# Patient Record
Sex: Female | Born: 1935 | Race: White | Hispanic: No | Marital: Single | Smoking: Former smoker
Health system: Southern US, Community
[De-identification: ages and names within clinical notes are randomized; demographics above are authoritative.]

## PROBLEM LIST (undated history)

## (undated) DIAGNOSIS — E119 Type 2 diabetes mellitus without complications: Secondary | ICD-10-CM

## (undated) DIAGNOSIS — E785 Hyperlipidemia, unspecified: Secondary | ICD-10-CM

## (undated) DIAGNOSIS — I10 Essential (primary) hypertension: Secondary | ICD-10-CM

## (undated) DIAGNOSIS — C3490 Malignant neoplasm of unspecified part of unspecified bronchus or lung: Secondary | ICD-10-CM

## (undated) DIAGNOSIS — C4490 Unspecified malignant neoplasm of skin, unspecified: Secondary | ICD-10-CM

## (undated) DIAGNOSIS — H9190 Unspecified hearing loss, unspecified ear: Secondary | ICD-10-CM

## (undated) DIAGNOSIS — Z923 Personal history of irradiation: Secondary | ICD-10-CM

## (undated) DIAGNOSIS — I509 Heart failure, unspecified: Secondary | ICD-10-CM

## (undated) DIAGNOSIS — J449 Chronic obstructive pulmonary disease, unspecified: Secondary | ICD-10-CM

## (undated) HISTORY — PX: TONSILLECTOMY: SUR1361

## (undated) HISTORY — DX: Type 2 diabetes mellitus without complications: E11.9

## (undated) HISTORY — PX: FACIAL RECONSTRUCTION SURGERY: SHX631

## (undated) HISTORY — PX: MOHS SURGERY: SUR867

## (undated) HISTORY — DX: Personal history of irradiation: Z92.3

---

## 1999-01-24 ENCOUNTER — Other Ambulatory Visit: Admission: RE | Admit: 1999-01-24 | Discharge: 1999-01-24 | Payer: Self-pay | Admitting: Internal Medicine

## 1999-08-31 ENCOUNTER — Other Ambulatory Visit: Admission: RE | Admit: 1999-08-31 | Discharge: 1999-08-31 | Payer: Self-pay | Admitting: Obstetrics and Gynecology

## 1999-12-28 ENCOUNTER — Other Ambulatory Visit: Admission: RE | Admit: 1999-12-28 | Discharge: 1999-12-28 | Payer: Self-pay | Admitting: Obstetrics and Gynecology

## 2000-01-25 ENCOUNTER — Other Ambulatory Visit: Admission: RE | Admit: 2000-01-25 | Discharge: 2000-01-25 | Payer: Self-pay | Admitting: Obstetrics and Gynecology

## 2000-04-26 ENCOUNTER — Other Ambulatory Visit: Admission: RE | Admit: 2000-04-26 | Discharge: 2000-04-26 | Payer: Self-pay | Admitting: Plastic Surgery

## 2000-06-14 ENCOUNTER — Other Ambulatory Visit: Admission: RE | Admit: 2000-06-14 | Discharge: 2000-06-14 | Payer: Self-pay | Admitting: Obstetrics and Gynecology

## 2005-10-08 ENCOUNTER — Inpatient Hospital Stay (HOSPITAL_COMMUNITY): Admission: EM | Admit: 2005-10-08 | Discharge: 2005-10-15 | Payer: Self-pay | Admitting: Emergency Medicine

## 2005-10-14 ENCOUNTER — Ambulatory Visit: Payer: Self-pay | Admitting: Emergency Medicine

## 2005-10-22 ENCOUNTER — Ambulatory Visit: Payer: Self-pay | Admitting: Emergency Medicine

## 2005-10-25 ENCOUNTER — Ambulatory Visit: Admission: RE | Admit: 2005-10-25 | Discharge: 2005-10-25 | Payer: Self-pay | Admitting: Emergency Medicine

## 2005-11-14 ENCOUNTER — Ambulatory Visit: Payer: Self-pay | Admitting: Emergency Medicine

## 2006-02-04 ENCOUNTER — Ambulatory Visit: Payer: Self-pay | Admitting: Emergency Medicine

## 2006-04-22 ENCOUNTER — Ambulatory Visit: Payer: Self-pay | Admitting: Emergency Medicine

## 2006-06-27 ENCOUNTER — Ambulatory Visit: Payer: Self-pay | Admitting: Emergency Medicine

## 2006-10-29 ENCOUNTER — Ambulatory Visit: Payer: Self-pay | Admitting: Emergency Medicine

## 2007-06-21 DIAGNOSIS — I10 Essential (primary) hypertension: Secondary | ICD-10-CM | POA: Insufficient documentation

## 2007-06-21 DIAGNOSIS — F17200 Nicotine dependence, unspecified, uncomplicated: Secondary | ICD-10-CM | POA: Insufficient documentation

## 2007-06-21 DIAGNOSIS — J449 Chronic obstructive pulmonary disease, unspecified: Secondary | ICD-10-CM | POA: Insufficient documentation

## 2007-06-21 DIAGNOSIS — F329 Major depressive disorder, single episode, unspecified: Secondary | ICD-10-CM

## 2007-06-21 DIAGNOSIS — E782 Mixed hyperlipidemia: Secondary | ICD-10-CM | POA: Insufficient documentation

## 2009-09-22 ENCOUNTER — Encounter: Admission: RE | Admit: 2009-09-22 | Discharge: 2009-09-22 | Payer: Self-pay | Admitting: Internal Medicine

## 2011-01-19 NOTE — Assessment & Plan Note (Signed)
[facility]                               PULMONARY OFFICE NOTE   NAME:CLEMONSDeaysia, Grigoryan                     MRN:          [id number]  DATE:04/22/2006                            DOB:          26-Jul-1935    SUBJECTIVE:  Ms. Brummitt is a 76 year old woman with COPD who follows up  today for a regularly scheduled visit.  She tells me that she has been  having some clear nasal drainage for about the last 3-4 days consistent with  an upper respiratory infection.  She has had some associated cough that has  been nonproductive.  Her breathing was initially a little worse when these  symptoms started but it has improved.  She has not had any purulent sputum,  fevers, chills, etc.  She continues to smoke 6-8 cigarettes daily but she is  now contemplating cessation, which is an improvement.   MEDICATIONS:  1. Advair 500/50 one inhalation b.i.d.  2. Paxil 20 mg daily.  3. Spiriva one inhalation daily.  4. Lipitor 80 mg daily.  5. Albuterol two puffs q.4h. p.r.n. for shortness of breath.   EXAM:  GENERAL:  This is a comfortable, well appearing woman who is in no  distress on room air.  Her weight is 174 pounds, temperature 98.4, blood  pressure 116/72, heart rate 98%, SPO2 91% on room air.  HEENT EXAM:  Benign.  LUNGS:  Clear to auscultation bilaterally.  HEART:  Regular rate and rhythm without murmur.  ABDOMEN:  Soft, nontender with positive bowel sounds.  EXTREMITIES:  No clubbing, cyanosis or edema.  NEUROLOGIC:  She has a grossly nonfocal exam.   Her last walking oximetry was done at her most recent office visit that  showed no evidence of walking desaturation and she has not been wearing  oxygen since our last visit.   IMPRESSION:  1. Chronic obstructive pulmonary disease.  2. Continued tobacco use.   PLANS:  1. Continue Advair 500/50 one inhalation b.i.d. and Spiriva one inhalation      daily with albuterol p.r.n.  2. We discussed smoking  cessation.  She is thinking about quitting and she      will call me when she is ready to set a quit date in order to form a      strategy for successful cessation.  3. She has a prescription on reserve for azithromycin in case she develops      symptoms consistent with a COPD exacerbation.  4. She will continue using symptomatic medications for her upper      respiratory infection.  5. Followup with me in 3 months or sooner if she should have any      significant difficulties.                                   Leslye Peer, MD   RSB/MedQ  DD:  04/22/2006  DT:  04/23/2006  Job #:  [id number]

## 2011-01-19 NOTE — Discharge Summary (Signed)
NAMESUSI, GOSLIN            ACCOUNT NO.:  [id number]   MEDICAL RECORD NO.:  [id number]          PATIENT TYPE:  INP   LOCATION:  3033                         FACILITY:  [facility]   PHYSICIAN:  Leslye Peer, M.D.  DATE OF BIRTH:  Mar 14, 1935   DATE OF ADMISSION:  10/08/2005  DATE OF DISCHARGE:  10/15/2005                                 DISCHARGE SUMMARY   DISCHARGE DIAGNOSES:  1.  Community-acquired pneumonia.  2.  O2-dependent respiratory failure secondary to chronic obstructive      pulmonary disease.  3.  History of hypertension.  4.  Hyperlipidemia.  5.  Chronic back pain.  6.  Possible obstructive sleep apnea.  7.  Depression.  She will remain on her Paxil.   HISTORY OF PRESENT ILLNESS:  Ms. Shoun is a 76 year old smoker lifelong  who presented to the emergency department at [facility] with chest  x-ray demonstrating a right lower lobe opacity with hypotension.  She also  is a lifelong smoker half a pack a day since age 76.  She presented with a  five day history of fever, nausea, vomiting, diarrhea, positive cough, and  also with chills.  Of note, she had been taking her blood pressure  medications doxazosin and quinapril despite having nausea, vomiting, and  diarrhea.  She presented to the emergency department in hypotension with  blood pressure 80/40 with sinus tachycardia of 117 but she remained alert  and oriented.  Her chest x-ray __________  was consistent with a right lower  lobe pneumonia and changed to chronic obstructive pulmonary disease and due  to her hypotension and shock she was admitted for further evaluation and  treatment with placement of her right subclavian central venous catheter and  aggressive treatment for sepsis.   LABORATORY DATA:  Blood cultures demonstrated Streptococcus pneumonia.  Sodium 137, potassium 3.5, chloride 101, CO2 29, glucose 82, BUN 1,  creatinine 0.5, calcium 8.3.  WBC 12.2, RBC 3.84, hemoglobin 9.2, hematocrit  33.4, platelets 262.  Arterial blood gas pH 7.32, pCO2 of 41 with an O2 of  66 on 6 L nasal cannula.  Note, WBC did reach a peak of 20.6.  INR is 1.1.  D-dimer is 1.74.  Her sodium reached a low of 128 and returned to 137.  Lactic acid was 3.3.  Troponin I is 0.07.  Cortisol level is 24.4.  Blood  cultures demonstrated Streptococcus pneumonia.  Sputum culture showed normal  orangeal flora.   RADIOGRAPHIC DATA:  Chest x-ray shows opacity right space consistent with  pneumonia, possible effusion, cardiomegaly, and questionable mild  congestion.  12-lead EKG shows sinus tachycardia without acute injury  pattern.   HOSPITAL COURSE:  #1 - COMMUNITY-ACQUIRED PNEUMONIA WITH BLOOD CULTURE  POSITIVE STREPTOCOCCUS PNEUMONIA:  She is admitted to [facility],  treated with pharmaceutical intervention of IV antibiotics along with  supplemental oxygen.  She responded well to treatment, was ready for  discharge home by October 15, 2005.   #2 - SHOCK:  She presented with hypotension shock secondary to community-  acquired pneumonia and bacteremia.  She was treated with usual  pharmaceutical interventions of IV fluid.  She had a central line placed and  was with goal-directed therapy to keep her central venous pressure between  greater than 8 and less than 12 and a urine output less than 30 which was  successful.  Initially she was started on systemic antibiotics Rocephin and  Zithromax which was subsequently changed to Avelox which would be continued  for a full 10 days post discharge.   #3 - HYPERTENSION:  Antihypertensives were held due to her shock state and  were continued to be placed on hold as her blood pressure is now within  normal range.  She will follow up with Dr. Lucky Cowboy on an outpatient  basis as her condition improves for questionable re-institution of her  antihypertensives.   #4 - TOBACCO ABUSE:  She has been instructed to top.   #5 - HYPERLIPIDEMIA:  She has been  restarted on her Lipitor at 80 mg a day.  This will be followed up with Dr. Lucky Cowboy.   #6 - DIARRHEA:  The diarrhea had improved and has been intermittent.  She  will have a C. difficile collected prior to discharge for safety's sake and  to ascertain if she has C. difficile.  If she does, she will be contacted at  home and appropriate therapy will be instituted.   #7 - HISTORY OF SKIN CANCER:  Had no influence on this admission.   #8 - O2-DEPENDENT CHRONIC OBSTRUCTIVE PULMONARY DISEASE:  She was ambulated  up and down the halls with O2 saturations desaturating into the 86th  percentile range.  For this reason she will be on O2 24/7 on discharge.  Her  chronic obstructive pulmonary disease has been addressed with Advair,  Spiriva, and MDI puffer on a p.r.n. basis.   DISCHARGE DIET:  As is tolerated.   DISCHARGE FOLLOW-UP:  With Dr. Levy Pupa on February 19 2:30 p.m.  She  has also been instructed to follow up with Dr. Oneta Rack.   DISPOSITION/CONDITION ON DISCHARGE:  Improved.   DISCHARGE MEDICATIONS:  1.  Oxygen 2 L 24 hours a day via nasal prongs.  Note, new medication.  2.  Avelox 400 mg one time a day until gone.  Note, new medicine.  3.  Advair 550 one puff two times a day.  4.  Paxil 20 mg once a day.  5.  Spiriva one puff once a day.  6.  Lipitor 80 mg once a day.  7.  Albuterol MDI two puffs p.r.n. no more than four times a day.  8.  Darvocet one to two tablets twice a day p.r.n. for pain.  9.  For loose stools she may use over-the-counter Pepto-Bismol or Imodium.      Brett Canales Minor, A.C.N.P. [facility]    ______________________________  Leslye Peer, M.D.    SM/MEDQ  D:  10/15/2005  T:  10/15/2005  Job:  [id number]   cc:   Lucky Cowboy, M.D.  Fax: [phone]

## 2011-01-19 NOTE — Assessment & Plan Note (Signed)
[facility]                               PULMONARY OFFICE NOTE   NAME:Hailey Keith, Hailey Keith                     MRN:          [id number]  DATE:06/27/2006                            DOB:          Apr 20, 1935    SUBJECTIVE:  Hailey Keith is a 76 year old woman with moderate to severe COPD  and continued tobacco use.  She follows up today for a regularly scheduled  followup visit.  She tells me that her breathing is doing well, and that she  is not experiencing any shortness of breath when she exerts herself.  She  has been able to do her usual daily activities, and has not received  antibiotics or prednisone since our last visit in August.  She continues to  smoke a half pack of cigarettes daily, and although she is contemplating  cessation, she has not yet been able to commit herself to this.  She has an  everyday cough that is productive of whitish phlegm.  She has not required  any supplemental Albuterol beyond her maintenance therapy.   MEDICATIONS:  1. Advair 500/50 one inhalation b.i.d.  2. Paxil 20 mg daily.  3. Spiriva 1 inhalation daily.  4. Lipitor 80 mg daily.  5. Albuterol 2 puffs q. 4. h. p.r.n. for shortness of breath.   PHYSICAL EXAMINATION:  GENERAL:  This is a pleasant, elderly woman who is in  no distress on room air.  VITAL SIGNS:  Her weight is 177 pounds.  Temperature 98.2.  Blood pressure  138/74.  Heart rate 80.  SPA02 94% on room air.  LUNGS:  A bit distant, but clear to auscultation bilaterally.  There is no  wheezing on forced expiration.  CARDIOVASCULAR:  Normal.  ABDOMEN:  Slightly obese, soft and non-tender with positive bowel sounds.  EXTREMITIES:  No cyanosis, clubbing or edema.   IMPRESSION:  1. Moderate to severe chronic obstructive pulmonary disease.  2. Continued tobacco use.   PLAN:  1. We will continue her Spiriva and Advair as ordered.  If she continues      to do well on her next visit, then I may decrease  her Advair to 250/50      one inhalation b.i.d.  2. Continue Albuterol p.r.n.  3. Again, we discussed smoking cessation.  She continues to contemplate      this, but is not ready to stop at this time.  4. I will follow up with Hailey Keith in 4 months, or sooner should she      have any difficulties.  5. She has already had her flu shot for this year.  She will find out when      Pneumovax was      done from her primary care physician and leave [place] a message so that we      can keep track of this as well.    ______________________________  Leslye Peer, MD    RSB/MedQ  DD: 06/27/2006  DT: 06/28/2006  Job #: [id number]

## 2011-01-19 NOTE — Assessment & Plan Note (Signed)
[facility]                             PULMONARY OFFICE NOTE   NAME:CLEMONSBlondina, Coderre                     MRN:          [id number]  DATE:10/29/2006                            DOB:          07-14-1935    SUBJECTIVE:  Ms. Woodroof is a 76 year old woman with a history of  tobacco abuse and continued smoking with moderate to severe COPD. She  tells me that since our last visit she has been treated at least once  approximately two months ago with prednisone and antibiotics for an  exacerbation of her COPD. She has also been started on a medication for  her diabetes. She is not sure what the name of this medication is at  this time and did not bring it with her today. She complains of fatigue  and persistent cough. She has had upper respiratory type symptoms for  the last week to week and a half with nasal congestion and increase in  her cough. She produces thick white to tan phlegm. She continues to work  and stay active. Her activity is somewhat limited, but this is by her  back pain. Unfortunately, she continues to smoke. She has been smoking a  half pack daily.   CURRENT MEDICATIONS:  1. Advair 500/50 one inhalation b.i.d.  2. Paxil 20 mg daily.  3. Spiriva one inhalation daily.  4. Lipitor 80 mg daily.  5. A new diabetes medication that is not available at this time.  6. Albuterol two puffs q4 hours p.r.n. shortness of breath.   PHYSICAL EXAMINATION:  In general, this is an obese, well-appearing  woman who is in no distress on room air. Her weight is 177 pounds.  Temperature 98.3, blood pressure is 124/72, heart rate 83. SPO2 is 92%  on room air.  HEENT: She has some inspiratory noise and mucus in her upper airway that  improves when she clears her throat.  LUNGS:  Are coarse without any expiratory wheezes.  HEART: Has a regular rate and rhythm without murmur.  ABDOMEN: Is obese, soft, nontender with positive bowel sounds.  EXTREMITIES: Have no  cyanosis, clubbing or edema.   CHEST X-RAY: Performed today shows stable bilateral lower lobe  predominant interstitial prominence without any obvious infiltrates or  effusions.   IMPRESSION:  1. Chronic obstructive pulmonary disease.  2. Continued tobacco use.  3. Upper respiratory infection with an increase in her cough, but      without any signs or symptoms of an active chronic obstructive      pulmonary disease exacerbation.   PLAN:  1. Continue Advair and Spiriva as ordered with albuterol p.r.n.  2. Offered her antibiotics and/or prednisone at this time, but she      knows to call me if she starts to have signs or symptoms consistent      with an exacerbation including fever, purulent sputum and increased      sputum amount or worsening dyspnea.  3. We discussed tobacco cessation in detail. She is closer to quitting      at this time than on our previous discussions and I  have asked her      to come back to see me with her calendar so that we can set a      formal quit date in two months.  4. I will followup with Ms. Siple at that time or sooner should she      have any difficulties in the interim.     Leslye Peer, MD  Electronically Signed    RSB/MedQ  DD: 10/29/2006  DT: 10/29/2006  Job #: [id number]   cc:   Lucky Cowboy, M.D.

## 2011-03-29 ENCOUNTER — Encounter (INDEPENDENT_AMBULATORY_CARE_PROVIDER_SITE_OTHER): Payer: Self-pay | Admitting: Ophthalmology

## 2011-05-02 ENCOUNTER — Encounter (INDEPENDENT_AMBULATORY_CARE_PROVIDER_SITE_OTHER): Payer: Self-pay | Admitting: Ophthalmology

## 2011-12-26 ENCOUNTER — Emergency Department (HOSPITAL_COMMUNITY): Payer: Medicare Other

## 2011-12-26 ENCOUNTER — Encounter (HOSPITAL_COMMUNITY): Payer: Self-pay | Admitting: *Deleted

## 2011-12-26 ENCOUNTER — Inpatient Hospital Stay (HOSPITAL_COMMUNITY)
Admission: EM | Admit: 2011-12-26 | Discharge: 2011-12-29 | DRG: 193 | Disposition: A | Discharge status: Home Health | Payer: Medicare Other | Attending: Internal Medicine | Admitting: Internal Medicine

## 2011-12-26 DIAGNOSIS — F32A Depression, unspecified: Secondary | ICD-10-CM | POA: Diagnosis present

## 2011-12-26 DIAGNOSIS — Z79899 Other long term (current) drug therapy: Secondary | ICD-10-CM

## 2011-12-26 DIAGNOSIS — I10 Essential (primary) hypertension: Secondary | ICD-10-CM | POA: Diagnosis present

## 2011-12-26 DIAGNOSIS — J189 Pneumonia, unspecified organism: Principal | ICD-10-CM | POA: Diagnosis present

## 2011-12-26 DIAGNOSIS — E119 Type 2 diabetes mellitus without complications: Secondary | ICD-10-CM | POA: Diagnosis present

## 2011-12-26 DIAGNOSIS — J449 Chronic obstructive pulmonary disease, unspecified: Secondary | ICD-10-CM | POA: Diagnosis present

## 2011-12-26 DIAGNOSIS — H9190 Unspecified hearing loss, unspecified ear: Secondary | ICD-10-CM | POA: Diagnosis present

## 2011-12-26 DIAGNOSIS — J9620 Acute and chronic respiratory failure, unspecified whether with hypoxia or hypercapnia: Secondary | ICD-10-CM | POA: Diagnosis present

## 2011-12-26 DIAGNOSIS — J4489 Other specified chronic obstructive pulmonary disease: Secondary | ICD-10-CM | POA: Diagnosis present

## 2011-12-26 DIAGNOSIS — M549 Dorsalgia, unspecified: Secondary | ICD-10-CM

## 2011-12-26 DIAGNOSIS — F17200 Nicotine dependence, unspecified, uncomplicated: Secondary | ICD-10-CM | POA: Diagnosis present

## 2011-12-26 DIAGNOSIS — E785 Hyperlipidemia, unspecified: Secondary | ICD-10-CM | POA: Diagnosis present

## 2011-12-26 DIAGNOSIS — R0902 Hypoxemia: Secondary | ICD-10-CM

## 2011-12-26 DIAGNOSIS — F3289 Other specified depressive episodes: Secondary | ICD-10-CM | POA: Diagnosis present

## 2011-12-26 DIAGNOSIS — E782 Mixed hyperlipidemia: Secondary | ICD-10-CM | POA: Diagnosis present

## 2011-12-26 DIAGNOSIS — F329 Major depressive disorder, single episode, unspecified: Secondary | ICD-10-CM | POA: Diagnosis present

## 2011-12-26 HISTORY — DX: Essential (primary) hypertension: I10

## 2011-12-26 HISTORY — DX: Chronic obstructive pulmonary disease, unspecified: J44.9

## 2011-12-26 HISTORY — DX: Hyperlipidemia, unspecified: E78.5

## 2011-12-26 HISTORY — DX: Unspecified hearing loss, unspecified ear: H91.90

## 2011-12-26 LAB — POCT I-STAT 3, ART BLOOD GAS (G3+)
Acid-Base Excess: 2 mmol/L (ref 0.0–2.0)
Bicarbonate: 26.5 mEq/L — ABNORMAL HIGH (ref 20.0–24.0)
O2 Saturation: 95 %
Patient temperature: 98.6
pCO2 arterial: 41.1 mmHg (ref 35.0–45.0)

## 2011-12-26 LAB — DIFFERENTIAL
Basophils Absolute: 0 10*3/uL (ref 0.0–0.1)
Lymphs Abs: 1.7 10*3/uL (ref 0.7–4.0)
Monocytes Absolute: 1.5 10*3/uL — ABNORMAL HIGH (ref 0.1–1.0)
Monocytes Relative: 11 % (ref 3–12)
Neutro Abs: 11 10*3/uL — ABNORMAL HIGH (ref 1.7–7.7)
Neutrophils Relative %: 77 % (ref 43–77)

## 2011-12-26 LAB — CBC
Hemoglobin: 13.5 g/dL (ref 12.0–15.0)
MCH: 31.5 pg (ref 26.0–34.0)
RBC: 4.29 MIL/uL (ref 3.87–5.11)

## 2011-12-26 LAB — BASIC METABOLIC PANEL
CO2: 29 mEq/L (ref 19–32)
GFR calc Af Amer: >90 mL/min (ref >90)
Potassium: 3.3 mEq/L — ABNORMAL LOW (ref 3.5–5.1)
Sodium: 136 mEq/L (ref 135–145)

## 2011-12-26 MED ORDER — DEXTROSE 5 % IV SOLN
500.0000 mg | INTRAVENOUS | Status: DC
  Administered 2011-12-26: 500 mg via INTRAVENOUS
  Filled 2011-12-26 (×2): qty 500

## 2011-12-26 MED ORDER — ENOXAPARIN SODIUM 40 MG/0.4ML ~~LOC~~ SOLN
40.0000 mg | SUBCUTANEOUS | Status: DC
  Administered 2011-12-26 – 2011-12-28 (×3): 40 mg via SUBCUTANEOUS
  Filled 2011-12-26 (×4): qty 0.4

## 2011-12-26 MED ORDER — DEXTROSE 5 % IV SOLN
1.0000 g | INTRAVENOUS | Status: DC
  Administered 2011-12-26: 1 g via INTRAVENOUS
  Filled 2011-12-26 (×2): qty 10

## 2011-12-26 MED ORDER — DEXTROSE 5 % IV SOLN
500.0000 mg | Freq: Once | INTRAVENOUS | Status: AC
  Administered 2011-12-26: 500 mg via INTRAVENOUS
  Filled 2011-12-26: qty 500

## 2011-12-26 MED ORDER — ENALAPRIL MALEATE 10 MG PO TABS
10.0000 mg | ORAL_TABLET | Freq: Every day | ORAL | Status: DC
  Administered 2011-12-27 – 2011-12-29 (×3): 10 mg via ORAL
  Filled 2011-12-26 (×3): qty 1

## 2011-12-26 MED ORDER — DOXAZOSIN MESYLATE 8 MG PO TABS
8.0000 mg | ORAL_TABLET | Freq: Every day | ORAL | Status: DC
  Administered 2011-12-27 – 2011-12-28 (×2): 8 mg via ORAL
  Filled 2011-12-26 (×3): qty 1

## 2011-12-26 MED ORDER — TIOTROPIUM BROMIDE MONOHYDRATE 18 MCG IN CAPS
18.0000 ug | ORAL_CAPSULE | Freq: Every day | RESPIRATORY_TRACT | Status: DC
  Administered 2011-12-27 – 2011-12-29 (×3): 18 ug via RESPIRATORY_TRACT
  Filled 2011-12-26: qty 5

## 2011-12-26 MED ORDER — NICOTINE 14 MG/24HR TD PT24
14.0000 mg | MEDICATED_PATCH | Freq: Every day | TRANSDERMAL | Status: DC
  Administered 2011-12-27 – 2011-12-29 (×3): 14 mg via TRANSDERMAL
  Filled 2011-12-26 (×3): qty 1

## 2011-12-26 MED ORDER — SODIUM CHLORIDE 0.9 % IJ SOLN: 3.0000 mL | INTRAMUSCULAR | Status: DC | PRN

## 2011-12-26 MED ORDER — ALPRAZOLAM 0.5 MG PO TABS
0.5000 mg | ORAL_TABLET | Freq: Three times a day (TID) | ORAL | Status: DC | PRN
  Administered 2011-12-26 – 2011-12-28 (×4): 0.5 mg via ORAL
  Filled 2011-12-26 (×4): qty 1

## 2011-12-26 MED ORDER — ONDANSETRON HCL 4 MG/2ML IJ SOLN: 4.0000 mg | Freq: Four times a day (QID) | INTRAMUSCULAR | Status: DC | PRN

## 2011-12-26 MED ORDER — IPRATROPIUM BROMIDE 0.02 % IN SOLN
0.5000 mg | RESPIRATORY_TRACT | Status: DC | PRN
  Administered 2011-12-26 – 2011-12-27 (×2): 0.5 mg via RESPIRATORY_TRACT
  Filled 2011-12-26 (×2): qty 2.5

## 2011-12-26 MED ORDER — BENZONATATE 100 MG PO CAPS
100.0000 mg | ORAL_CAPSULE | Freq: Three times a day (TID) | ORAL | Status: DC | PRN
  Administered 2011-12-26: 100 mg via ORAL
  Filled 2011-12-26 (×2): qty 1

## 2011-12-26 MED ORDER — GUAIFENESIN ER 600 MG PO TB12
600.0000 mg | ORAL_TABLET | Freq: Two times a day (BID) | ORAL | Status: DC
  Administered 2011-12-26 – 2011-12-29 (×6): 600 mg via ORAL
  Filled 2011-12-26 (×7): qty 1

## 2011-12-26 MED ORDER — CEFTRIAXONE SODIUM 1 G IJ SOLR
1.0000 g | Freq: Once | INTRAMUSCULAR | Status: AC
  Administered 2011-12-26: 18:00:00 via INTRAVENOUS
  Filled 2011-12-26: qty 10

## 2011-12-26 MED ORDER — ALBUTEROL SULFATE (5 MG/ML) 0.5% IN NEBU
2.5000 mg | INHALATION_SOLUTION | RESPIRATORY_TRACT | Status: DC | PRN
  Administered 2011-12-26 – 2011-12-27 (×2): 2.5 mg via RESPIRATORY_TRACT
  Filled 2011-12-26 (×2): qty 0.5

## 2011-12-26 MED ORDER — ALUM & MAG HYDROXIDE-SIMETH 200-200-20 MG/5ML PO SUSP: 30.0000 mL | Freq: Four times a day (QID) | ORAL | Status: DC | PRN

## 2011-12-26 MED ORDER — FLUTICASONE-SALMETEROL 500-50 MCG/DOSE IN AEPB
1.0000 | INHALATION_SPRAY | Freq: Two times a day (BID) | RESPIRATORY_TRACT | Status: DC
  Administered 2011-12-26 – 2011-12-29 (×6): 1 via RESPIRATORY_TRACT
  Filled 2011-12-26: qty 14

## 2011-12-26 MED ORDER — ONDANSETRON HCL 4 MG PO TABS: 4.0000 mg | ORAL_TABLET | Freq: Four times a day (QID) | ORAL | Status: DC | PRN

## 2011-12-26 NOTE — H&P (Signed)
PCP:   Nadean Corwin, MD, MD   Chief Complaint:  Cough  HPI: This is a 77 year old female who went to [place] approximately week ago. This initially developed a mild stomach bug, she had some nausea, vomiting and mild diarrhea. On Monday she said the feeling weak and she was reported to be somewhat disoriented. Today her daughter drove to [place] to picked her up. They took her to see her PCP, she had a low-grade fever and a cough,she was also thought to be dehydrated. Her cough is productive for a beige phlegm. She was sent to the ER for evaluation. History provided by the patient and her daughter Hulan Fess who is her power of attorney. Patient was previously on home oxygen, she stopped this herself a few years back. Per daughter the PA at her PCPs office is ordering home oxygen.  Review of Systems: positives bolded   The patient denies anorexia, fever, weight loss,, vision loss, decreased hearing, hoarseness, chest pain, syncope, dyspnea on exertion, peripheral edema, balance deficits, hemoptysis, abdominal pain, melena, hematochezia, severe indigestion/heartburn, hematuria, incontinence, genital sores, muscle weakness, suspicious skin lesions, transient blindness, difficulty walking, depression, unusual weight change, abnormal bleeding, enlarged lymph nodes, angioedema, and breast masses.  Past Medical History: Past Medical History  Diagnosis Date  . COPD (chronic obstructive pulmonary disease)   . Hypertension   . Dyslipidemia   . Diabetes mellitus     Diet controlled   . Hard of hearing    History reviewed. No pertinent past surgical history.  Medications: Prior to Admission medications   Medication Sig Start Date End Date Taking? Authorizing Provider  ALPRAZolam Prudy Feeler) 0.5 MG tablet Take 0.5 mg by mouth 3 (three) times daily as needed. For anxiety   Yes Historical Provider, MD  doxazosin (CARDURA) 8 MG tablet Take 8 mg by mouth at bedtime.   Yes  Historical Provider, MD  enalapril (VASOTEC) 10 MG tablet Take 10 mg by mouth daily.   Yes Historical Provider, MD  Fluticasone-Salmeterol (ADVAIR) 500-50 MCG/DOSE AEPB Inhale 1 puff into the lungs every 12 (twelve) hours.   Yes Historical Provider, MD  guaiFENesin (MUCINEX) 600 MG 12 hr tablet Take 600 mg by mouth 2 (two) times daily.   Yes Historical Provider, MD  tiotropium (SPIRIVA) 18 MCG inhalation capsule Place 18 mcg into inhaler and inhale daily.   Yes Historical Provider, MD    Allergies:  No Known Allergies  Social History:  reports that she has been smoking.  She does not have any smokeless tobacco history on file. She reports that she drinks alcohol. She reports that she does not use illicit drugs. lives in a senior care center, no home oxygen, doesn't use a walker or cane, lives alone.  Family History: Family History  Problem Relation Age of Onset  . Hypertension      Physical Exam: Filed Vitals:   12/26/11 1635 12/26/11 1807  BP: 134/60 141/67  Pulse: 84 86  Temp: 98.6 F (37 C) 98.8 F (37.1 C)  TempSrc:  Rectal  Resp: 20   SpO2: 85% 94%    General:  Alert and oriented times three, well developed and nourished, no acute distress Eyes: PERRLA, pink conjunctiva, no scleral icterus ENT: Moist oral mucosa, neck supple, no thyromegaly Lungs: clear to ascultation, no wheeze, no crackles, no use of accessory muscles Cardiovascular: regular rate and rhythm, no regurgitation, no gallops, no murmurs. No carotid bruits, no JVD Abdomen: soft, positive BS, non-tender, non-distended, no organomegaly, not an  acute abdomen GU: not examined Neuro: CN II - XII grossly intact, sensation intact Musculoskeletal: strength 5/5 all extremities, no clubbing, cyanosis or edema Skin: no rash, no subcutaneous crepitation, no decubitus Psych: appropriate patient   Labs on Admission:   Basename 12/26/11 1715  NA 136  K 3.3*  CL 99  CO2 29  GLUCOSE 106*  BUN 9  CREATININE 0.62    CALCIUM 9.6  MG --  PHOS --   No results found for this basename: AST:2,ALT:2,ALKPHOS:2,BILITOT:2,PROT:2,ALBUMIN:2 in the last 72 hours No results found for this basename: LIPASE:2,AMYLASE:2 in the last 72 hours  Basename 12/26/11 1715  WBC 14.4*  NEUTROABS 11.0*  HGB 13.5  HCT 39.6  MCV 92.3  PLT 190    Basename 12/26/11 1712  CKTOTAL --  CKMB --  CKMBINDEX --  TROPONINI <0.30   No components found with this basename: POCBNP:3 No results found for this basename: DDIMER:2 in the last 72 hours No results found for this basename: HGBA1C:2 in the last 72 hours No results found for this basename: CHOL:2,HDL:2,LDLCALC:2,TRIG:2,CHOLHDL:2,LDLDIRECT:2 in the last 72 hours No results found for this basename: TSH,T4TOTAL,FREET3,T3FREE,THYROIDAB in the last 72 hours No results found for this basename: VITAMINB12:2,FOLATE:2,FERRITIN:2,TIBC:2,IRON:2,RETICCTPCT:2 in the last 72 hours  Micro Results: No results found for this or any previous visit (from the past 240 hour(s)).   Radiological Exams on Admission: Dg Chest 2 View  12/26/2011  *RADIOLOGY REPORT*  Clinical Data: Shortness of breath.  Cough.  CHEST - 2 VIEW  Comparison: Two-view chest 09/22/2009.  Findings: A right upper lobe pneumonia is present.  Bibasilar airspace disease is worse on the right.  Emphysematous changes are again noted.  The left upper lobe is clear.  The visualized soft tissues and bony thorax are unremarkable.  IMPRESSION:  1.  Right upper and lower lobe pneumonia. 2.  Minimal airspace disease at the left base likely reflects atelectasis. 3.  Emphysema.  Original Report Authenticated By: Jamesetta Orleans. MATTERN, M.D.    Assessment/Plan Present on Admission:  .Community acquired pneumonia .Hypoxia Admit to MedSurg Sputum cultures ordered Antibiotics Rocephin and azithromycin ordered Duo nebs, Tessalon Perles, oxygen ordered  .CHRONIC OBSTRUCTIVE PULMONARY DISEASE Tobacco abuse Nicotine patch  ordered Tobacco cessation education .HYPERLIPIDEMIA .HYPERTENSION .DEPRESSION Diabetes mellitus-diet controlled Stable resume home medications  Full code DVT prophylaxis Team 7/Dr. Andree Moro, Reyaan Thoma 12/26/2011, 8:31 PM

## 2011-12-26 NOTE — ED Notes (Signed)
Reports having recent n/v/d. Sent here today by pcp due to low o2 sats at appt, spo2 85% at triage, reports hx of copd.

## 2011-12-26 NOTE — ED Notes (Signed)
Reports onset of SOB x4 days with progressive worsening; denies any c/o pain at this time or pta; does endorse prod cough of "small" amt of "thick, beige" sputum as well as temp of 99.6 at home; audible wheezing noted upon interview; resp nonlabored; states smokes approx 4-5 cigarettes/day since she "was a teenager"; daughter reports spoke with pt via telephone and she appeared to be altered/confused; pt presently caox4, approp in conversation

## 2011-12-26 NOTE — ED Provider Notes (Signed)
History     CSN: [id number]  Arrival date & time 12/26/11  1617   First MD Initiated Contact with Patient 12/26/11 1656      Chief Complaint  Patient presents with  . Shortness of Breath    (Consider location/radiation/quality/duration/timing/severity/associated sxs/prior treatment) Patient is a 77 y.o. female presenting with shortness of breath and diarrhea. The history is provided by the patient and a relative.  Shortness of Breath  The current episode started more than 2 weeks ago. The problem occurs continuously. The problem has been unchanged. The problem is mild. The symptoms are relieved by nothing. The symptoms are aggravated by nothing. Associated symptoms include shortness of breath. Pertinent negatives include no chest pain and no cough. There were no sick contacts. She has received no recent medical care.  Diarrhea The primary symptoms include fatigue, nausea, vomiting and diarrhea. Primary symptoms do not include abdominal pain, dysuria or rash. The illness began 3 to 5 days ago. The onset was gradual. The problem has been gradually improving.  The emesis contains stomach contents.  The diarrhea is watery. The diarrhea occurs 2 to 4 times per day.    Past Medical History  Diagnosis Date  . COPD (chronic obstructive pulmonary disease)   . Hypertension     History reviewed. No pertinent past surgical history.  History reviewed. No pertinent family history.  History  Substance Use Topics  . Smoking status: Current Some Day Smoker  . Smokeless tobacco: Not on file  . Alcohol Use: Yes     occ wine    OB History    Grav Para Term Preterm Abortions TAB SAB Ect Mult Living                  Review of Systems  Constitutional: Positive for fatigue.  HENT: Negative for neck pain.   Respiratory: Positive for shortness of breath. Negative for cough and chest tightness.   Cardiovascular: Negative for chest pain.  Gastrointestinal: Positive for nausea, vomiting and  diarrhea. Negative for abdominal pain.  Genitourinary: Negative for dysuria.  Skin: Negative for rash.  Neurological: Negative for headaches.  Psychiatric/Behavioral: Positive for confusion.  All other systems reviewed and are negative.    Allergies  Review of patient's allergies indicates no known allergies.  Home Medications   Current Outpatient Rx  Name Route Sig Dispense Refill  . ALPRAZOLAM 0.5 MG PO TABS Oral Take 0.5 mg by mouth 3 (three) times daily as needed. For anxiety    . DOXAZOSIN MESYLATE 8 MG PO TABS Oral Take 8 mg by mouth at bedtime.    . ENALAPRIL MALEATE 10 MG PO TABS Oral Take 10 mg by mouth daily.    Marland Kitchen FLUTICASONE-SALMETEROL 500-50 MCG/DOSE IN AEPB Inhalation Inhale 1 puff into the lungs every 12 (twelve) hours.    . GUAIFENESIN ER 600 MG PO TB12 Oral Take 600 mg by mouth 2 (two) times daily.    Marland Kitchen TIOTROPIUM BROMIDE MONOHYDRATE 18 MCG IN CAPS Inhalation Place 18 mcg into inhaler and inhale daily.      BP 134/60  Pulse 84  Temp 98.6 F (37 C)  Resp 20  SpO2 85%  Physical Exam  Nursing note and vitals reviewed. Constitutional: She is oriented to person, place, and time. She appears well-developed and well-nourished.  HENT:  Head: Normocephalic and atraumatic.  Eyes: EOM are normal. Pupils are equal, round, and reactive to light.  Neck: Normal range of motion.  Cardiovascular: Normal rate, regular rhythm and normal heart  sounds.   Pulmonary/Chest: Effort normal. No respiratory distress. She has wheezes (Faint exp).  Abdominal: Soft. She exhibits no distension. There is no tenderness. There is no rebound and no guarding.  Musculoskeletal: Normal range of motion.  Neurological: She is alert and oriented to person, place, and time.  Skin: Skin is warm and dry.  Psychiatric: She has a normal mood and affect.    ED Course  Procedures (including critical care time)  Date: 12/26/2011  Rate: 72  Rhythm: normal sinus rhythm  QRS Axis: normal  Intervals:  normal  ST/T Wave abnormalities: normal  Conduction Disutrbances:none  Narrative Interpretation:   Old EKG Reviewed: changes noted; tachycardia has improved   Labs Reviewed  CBC - Abnormal; Notable for the following:    WBC 14.4 (*)    All other components within normal limits  DIFFERENTIAL - Abnormal; Notable for the following:    Neutro Abs 11.0 (*)    Monocytes Absolute 1.5 (*)    All other components within normal limits  BASIC METABOLIC PANEL - Abnormal; Notable for the following:    Potassium 3.3 (*)    Glucose, Bld 106 (*)    GFR calc non Af Amer 87 (*)    All other components within normal limits  PRO B NATRIURETIC PEPTIDE - Abnormal; Notable for the following:    Pro B Natriuretic peptide (BNP) 2180.0 (*)    All other components within normal limits  POCT I-STAT 3, BLOOD GAS (G3+) - Abnormal; Notable for the following:    pH, Arterial 7.417 (*)    pO2, Arterial 72.0 (*)    Bicarbonate 26.5 (*)    All other components within normal limits  TROPONIN I   Dg Chest 2 View  12/26/2011  *RADIOLOGY REPORT*  Clinical Data: Shortness of breath.  Cough.  CHEST - 2 VIEW  Comparison: Two-view chest 09/22/2009.  Findings: A right upper lobe pneumonia is present.  Bibasilar airspace disease is worse on the right.  Emphysematous changes are again noted.  The left upper lobe is clear.  The visualized soft tissues and bony thorax are unremarkable.  IMPRESSION:  1.  Right upper and lower lobe pneumonia. 2.  Minimal airspace disease at the left base likely reflects atelectasis. 3.  Emphysema.  Original Report Authenticated By: Jamesetta Orleans. MATTERN, M.D.     1. Community acquired pneumonia   2. Hypoxia       MDM  Patient presents for different complaints for herself it is related to her family member. Patient's primary complaints are some nausea and vomiting and occasional diarrhea she's had intermittently over 3 days. This occurred while in other family members house. She reports  decreased by mouth intake. She denies abdominal pain. She's had chills but no fevers. Further history from her daughter states that her mental status has been off for approximately one to 2 days. She's been worked up using usual. She also appears as though she is more short of breath than usual. In questioning the patient she denies any increased cough or shortness of breath. She states her sputum production is actually decreased. Patient has a history of COPD and was previously on oxygen therapy but has been off of it for approximately 5 years when she took herself off. She was here by her PCP. Here the patient is hypoxic to 85% on room air.  Her labs showed elevated white count of 14,000 a chest x-ray is concerning for pneumonia. [facility] but  sounds his private housing thus will treated for community-acquired pneumonia. Admitted to hospitalist.        Donnamarie Poag, MD 12/26/11 2356

## 2011-12-26 NOTE — ED Notes (Signed)
[id number] Ready

## 2011-12-26 NOTE — ED Notes (Signed)
Patient resting with NAD at this time. Patient remains on monitor and sats of 96%.

## 2011-12-27 LAB — CBC
HCT: 36.6 % (ref 36.0–46.0)
Hemoglobin: 12.2 g/dL (ref 12.0–15.0)
MCH: 30.7 pg (ref 26.0–34.0)
RBC: 3.97 MIL/uL (ref 3.87–5.11)

## 2011-12-27 LAB — BASIC METABOLIC PANEL
Chloride: 99 mEq/L (ref 96–112)
GFR calc Af Amer: >90 mL/min (ref >90)
GFR calc non Af Amer: >90 mL/min (ref >90)
Glucose, Bld: 90 mg/dL (ref 70–99)
Potassium: 3 mEq/L — ABNORMAL LOW (ref 3.5–5.1)
Sodium: 136 mEq/L (ref 135–145)

## 2011-12-27 LAB — EXPECTORATED SPUTUM ASSESSMENT W GRAM STAIN, RFLX TO RESP C

## 2011-12-27 MED ORDER — METHYLPREDNISOLONE SODIUM SUCC 125 MG IJ SOLR
60.0000 mg | Freq: Two times a day (BID) | INTRAMUSCULAR | Status: DC
  Administered 2011-12-27: 60 mg via INTRAVENOUS
  Filled 2011-12-27 (×3): qty 0.96

## 2011-12-27 MED ORDER — PREDNISONE 50 MG PO TABS: 50.0000 mg | ORAL_TABLET | Freq: Every day | ORAL | Status: DC

## 2011-12-27 MED ORDER — ALBUTEROL SULFATE (5 MG/ML) 0.5% IN NEBU
2.5000 mg | INHALATION_SOLUTION | Freq: Four times a day (QID) | RESPIRATORY_TRACT | Status: DC
  Administered 2011-12-27 – 2011-12-29 (×6): 2.5 mg via RESPIRATORY_TRACT
  Filled 2011-12-27 (×7): qty 0.5

## 2011-12-27 MED ORDER — MOXIFLOXACIN HCL 400 MG PO TABS
400.0000 mg | ORAL_TABLET | Freq: Every day | ORAL | Status: DC
  Administered 2011-12-27 – 2011-12-28 (×2): 400 mg via ORAL
  Filled 2011-12-27 (×3): qty 1

## 2011-12-27 MED ORDER — PREDNISONE 50 MG PO TABS
50.0000 mg | ORAL_TABLET | Freq: Every day | ORAL | Status: DC
  Filled 2011-12-27 (×2): qty 1

## 2011-12-27 NOTE — ED Provider Notes (Signed)
  I performed a history and physical examination of Hailey Keith and discussed her management with Dr. Vear Clock.  I agree with the history, physical, assessment, and plan of care, with the following exceptions: None  I was present for the following procedures: None Time Spent in Critical Care of the patient: None Time spent in discussions with the patient and family: 10  Hailey Keith    This elderly female with COPD and presents with multiple complaints, notably fatigue, shortness of breath and diarrhea.  On exam the patient is in no distress, but she requires a little oxygen to achieve appropriate saturation.  The patient's saturation on room air is 85% which is abnormal.  The patient is not tachycardic with sinus rhythm.  Have reviewed the ECGs well, which is nonischemic.  The patient's x-ray demonstrates right sided pneumonia.  The patient was admitted for further evaluation and management following provision of antibiotics in the emergency department.  Gerhard Munch, MD 12/27/11 0010

## 2011-12-27 NOTE — Progress Notes (Signed)
Subjective: Breathing improved.  Objective: Filed Vitals:   12/26/11 1807 12/26/11 2121 12/26/11 2144 12/27/11 0620  BP: 141/67 151/69 145/68 145/69  Pulse: 86  77 85  Temp: 98.8 F (37.1 C) 99 F (37.2 C) 99.4 F (37.4 C) 99 F (37.2 C)  TempSrc: Rectal Oral Oral Oral  Resp:   18 22  Height:   5\' 4"  (1.626 m)   Weight:   61.8 kg (136 lb 3.9 oz)   SpO2: 94% 95% 94% 91%   Weight change:  No intake or output data in the 24 hours ending 12/27/11 1212  General: Alert, awake, oriented x3, in no acute distress.  HEENT: No bruits, no goiter.  Heart: Regular rate and rhythm, without murmurs, rubs, gallops.  Lungs: Crackles left side, bilateral air movement. wheeZING B/L Abdomen: Soft, nontender, nondistended, positive bowel sounds.  Neuro: Grossly intact, nonfocal.   Lab Results:  Basename 12/27/11 0640 12/26/11 1715  NA 136 136  K 3.0* 3.3*  CL 99 99  CO2 27 29  GLUCOSE 90 106*  BUN 5* 9  CREATININE 0.52 0.62  CALCIUM 8.7 9.6  MG -- --  PHOS -- --   No results found for this basename: AST:2,ALT:2,ALKPHOS:2,BILITOT:2,PROT:2,ALBUMIN:2 in the last 72 hours No results found for this basename: LIPASE:2,AMYLASE:2 in the last 72 hours  Basename 12/27/11 0640 12/26/11 1715  WBC 10.9* 14.4*  NEUTROABS -- 11.0*  HGB 12.2 13.5  HCT 36.6 39.6  MCV 92.2 92.3  PLT 188 190    Basename 12/26/11 1712  CKTOTAL --  CKMB --  CKMBINDEX --  TROPONINI <0.30   No components found with this basename: POCBNP:3 No results found for this basename: DDIMER:2 in the last 72 hours No results found for this basename: HGBA1C:2 in the last 72 hours No results found for this basename: CHOL:2,HDL:2,LDLCALC:2,TRIG:2,CHOLHDL:2,LDLDIRECT:2 in the last 72 hours No results found for this basename: TSH,T4TOTAL,FREET3,T3FREE,THYROIDAB in the last 72 hours No results found for this basename: VITAMINB12:2,FOLATE:2,FERRITIN:2,TIBC:2,IRON:2,RETICCTPCT:2 in the last 72 hours  Micro Results: No results  found for this or any previous visit (from the past 240 hour(s)).  Studies/Results: Dg Chest 2 View  12/26/2011  *RADIOLOGY REPORT*  Clinical Data: Shortness of breath.  Cough.  CHEST - 2 VIEW  Comparison: Two-view chest 09/22/2009.  Findings: A right upper lobe pneumonia is present.  Bibasilar airspace disease is worse on the right.  Emphysematous changes are again noted.  The left upper lobe is clear.  The visualized soft tissues and bony thorax are unremarkable.  IMPRESSION:  1.  Right upper and lower lobe pneumonia. 2.  Minimal airspace disease at the left base likely reflects atelectasis. 3.  Emphysema.  Original Report Authenticated By: Jamesetta Orleans. MATTERN, M.D.    Medications: I have reviewed the patient's current medications.  Assessment and plan: Principal Problem:  *Community acquired pneumonia: -D/C Rocephin azithro, afebrile, WBC trending down. Continue O2.  -START AVELOX   HYPERLIPIDEMIA -Stable.   TOBACCO ABUSE -Counseling   DEPRESSION -Stable.   HYPERTENSION -Stable monitor.   CHRONIC OBSTRUCTIVE PULMONARY DISEASE -Mild wheezing start steroids. -Start steroids   Hypoxia -resolved.  -ambulate without oxygen.   LOS: 1 day   Marinda Elk M.D. Pager: [phone] [facility] 12/27/2011, 12:12 PM

## 2011-12-27 NOTE — Progress Notes (Signed)
Utilization Review Completed.Preslea Rhodus T4/25/2013   

## 2011-12-27 NOTE — Progress Notes (Signed)
Pt set up on continuous pulse ox this AM to monitor oxygen saturations due to hypoxia on admission. Pt was maintaining a saturation around 87-88% with a pulse in the mid to upper 80s. She was on 3L. Pt was not SOB and mildly tachypnic at around 20 breaths a minute. No complaints. MD notified and stated it was ok to turn up oxygen to keep O2 sats above 88%. Pt titrated up to 3.5L and is maintaining around 90%. Will continue to monitor.

## 2011-12-28 MED ORDER — METHYLPREDNISOLONE SODIUM SUCC 125 MG IJ SOLR
125.0000 mg | Freq: Two times a day (BID) | INTRAMUSCULAR | Status: DC
  Administered 2011-12-28 (×2): 125 mg via INTRAVENOUS
  Filled 2011-12-28 (×2): qty 2

## 2011-12-28 MED ORDER — ZOLPIDEM TARTRATE 5 MG PO TABS: 5.0000 mg | ORAL_TABLET | Freq: Every evening | ORAL | Status: DC | PRN

## 2011-12-28 MED ORDER — PREDNISONE 50 MG PO TABS
60.0000 mg | ORAL_TABLET | Freq: Two times a day (BID) | ORAL | Status: DC
  Administered 2011-12-29: 60 mg via ORAL
  Filled 2011-12-28 (×3): qty 1

## 2011-12-28 NOTE — Progress Notes (Signed)
Patient sat in the chair without oxygen and her O2 sat dropped down to 85% ,put back on oxygen 3liter and O2 sat back to 90%.

## 2011-12-28 NOTE — Progress Notes (Signed)
Subjective: SOB today. Hard time sleeping. Coughing through out the night.  Objective: Filed Vitals:   12/27/11 1543 12/27/11 1805 12/27/11 2204 12/28/11 0555  BP:   132/62 145/73  Pulse:   93 73  Temp:  98.3 F (36.8 C) 99.3 F (37.4 C) 98.6 F (37 C)  TempSrc:   Oral Oral  Resp:   22 20  Height:      Weight:      SpO2: 91%  91% 91%   Weight change:   Intake/Output Summary (Last 24 hours) at 12/28/11 0802 Last data filed at 12/27/11 1356  Gross per 24 hour  Intake    120 ml  Output      0 ml  Net    120 ml    General: Alert, awake, oriented x3, in no acute distress.  HEENT: No bruits, no goiter.  Heart: Regular rate and rhythm, without murmurs, rubs, gallops.  Lungs: good air movement. wheeZING B/L Abdomen: Soft, nontender, nondistended, positive bowel sounds.  Neuro: Grossly intact, nonfocal.   Lab Results:  Basename 12/27/11 0640 12/26/11 1715  NA 136 136  K 3.0* 3.3*  CL 99 99  CO2 27 29  GLUCOSE 90 106*  BUN 5* 9  CREATININE 0.52 0.62  CALCIUM 8.7 9.6  MG -- --  PHOS -- --   No results found for this basename: AST:2,ALT:2,ALKPHOS:2,BILITOT:2,PROT:2,ALBUMIN:2 in the last 72 hours No results found for this basename: LIPASE:2,AMYLASE:2 in the last 72 hours  Basename 12/27/11 0640 12/26/11 1715  WBC 10.9* 14.4*  NEUTROABS -- 11.0*  HGB 12.2 13.5  HCT 36.6 39.6  MCV 92.2 92.3  PLT 188 190    Basename 12/26/11 1712  CKTOTAL --  CKMB --  CKMBINDEX --  TROPONINI <0.30   No components found with this basename: POCBNP:3 No results found for this basename: DDIMER:2 in the last 72 hours No results found for this basename: HGBA1C:2 in the last 72 hours No results found for this basename: CHOL:2,HDL:2,LDLCALC:2,TRIG:2,CHOLHDL:2,LDLDIRECT:2 in the last 72 hours No results found for this basename: TSH,T4TOTAL,FREET3,T3FREE,THYROIDAB in the last 72 hours No results found for this basename: VITAMINB12:2,FOLATE:2,FERRITIN:2,TIBC:2,IRON:2,RETICCTPCT:2 in the  last 72 hours  Micro Results: Recent Results (from the past 240 hour(s))  CULTURE, SPUTUM-ASSESSMENT     Status: Normal   Collection Time   12/27/11 12:33 PM      Component Value Range Status Comment   Specimen Description SPUTUM   Final    Special Requests NONE   Final    Sputum evaluation     Final    Value: MICROSCOPIC FINDINGS SUGGEST THAT THIS SPECIMEN IS NOT REPRESENTATIVE OF LOWER RESPIRATORY SECRETIONS. PLEASE RECOLLECT.     CALLED TO Peggyann Juba, RN 12/27/11 1405 BY K SCHULTZ   Report Status 12/27/2011 FINAL   Final   CULTURE, SPUTUM-ASSESSMENT     Status: Normal   Collection Time   12/27/11  6:05 PM      Component Value Range Status Comment   Specimen Description SPUTUM   Final    Special Requests NONE   Final    Sputum evaluation     Final    Value: THIS SPECIMEN IS ACCEPTABLE. RESPIRATORY CULTURE REPORT TO FOLLOW.   Report Status 12/27/2011 FINAL   Final     Studies/Results: Dg Chest 2 View  12/26/2011  *RADIOLOGY REPORT*  Clinical Data: Shortness of breath.  Cough.  CHEST - 2 VIEW  Comparison: Two-view chest 09/22/2009.  Findings: A right upper lobe pneumonia is present.  Bibasilar  airspace disease is worse on the right.  Emphysematous changes are again noted.  The left upper lobe is clear.  The visualized soft tissues and bony thorax are unremarkable.  IMPRESSION:  1.  Right upper and lower lobe pneumonia. 2.  Minimal airspace disease at the left base likely reflects atelectasis. 3.  Emphysema.  Original Report Authenticated By: Jamesetta Orleans. MATTERN, M.D.    Medications: I have reviewed the patient's current medications.  Assessment and plan: Principal Problem:  *Community acquired pneumonia: -de sating with ambulation. Increase steroids. -WBC down. Continue avelox.   HYPERLIPIDEMIA -Stable.   TOBACCO ABUSE -Counseling   DEPRESSION -Stable.   HYPERTENSION -Stable monitor.   CHRONIC OBSTRUCTIVE PULMONARY DISEASE -Mild wheezing increase steroids.    Hypoxia -desats with ambulation. -Might need oxygen at home.    LOS: 2 days   Marinda Elk M.D. Pager: [phone] [facility] 12/28/2011, 8:02 AM

## 2011-12-28 NOTE — Progress Notes (Signed)
PT Cancellation Note  Treatment cancelled today due to patient's refusal to participate.  Patient was sleeping and wishes to finish her nap prior to getting up with therapy.  Will reattempt tomorrow.  Thanks.  Abad Manard,CYNDI 12/28/2011, 4:19 PM

## 2011-12-28 NOTE — Progress Notes (Signed)
   CARE MANAGEMENT NOTE 12/28/2011  Patient:  Hailey Keith, Hailey Keith   Account Number:  [id number]  Date Initiated:  12/28/2011  Documentation initiated by:  Alvira Philips Assessment:   77 yr-old female adm with PNA/COPD exac; lives alone, independent PTA; has h/o home health services through [facility].     Anticipated DC Plan:  HOME W HOME HEALTH SERVICES      DC Planning Services  CM consult      PAC Choice  DURABLE MEDICAL EQUIPMENT  HOME HEALTH   Choice offered to / List presented to:  C-1 Patient   DME arranged  OXYGEN      DME agency  [facility]     [facility] arranged  HH-1 RN  HH-10 DISEASE MANAGEMENT      HH agency  [facility]   Comments:  PCP:  Dr. Lucky Cowboy  12/28/11 0940 Shuna Tabor RN MSN CCM Per pt and daughter, pt recently moved into the [place] but plans to d/c home with daughter for several weeks.  RA O2 sats 85%, pt to d/c on oxygen with home health RN to check sats, educate re COPD mgmt. Provided list of agencies, pt prefers agency that provided services previously.  Referral made.

## 2011-12-29 MED ORDER — PREDNISONE 10 MG PO TABS: ORAL_TABLET | ORAL | Status: DC

## 2011-12-29 MED ORDER — POTASSIUM CHLORIDE CRYS ER 20 MEQ PO TBCR
40.0000 meq | EXTENDED_RELEASE_TABLET | Freq: Three times a day (TID) | ORAL | Status: DC
  Administered 2011-12-29: 40 meq via ORAL
  Filled 2011-12-29 (×3): qty 2

## 2011-12-29 MED ORDER — NICOTINE 14 MG/24HR TD PT24: 1.0000 | MEDICATED_PATCH | Freq: Every day | TRANSDERMAL | Status: AC

## 2011-12-29 MED ORDER — MOXIFLOXACIN HCL 400 MG PO TABS: 400.0000 mg | ORAL_TABLET | Freq: Every day | ORAL | Status: AC

## 2011-12-29 NOTE — Progress Notes (Signed)
   CARE MANAGEMENT NOTE 12/29/2011  Patient:  Hailey Keith, Hailey Keith   Account Number:  [id number]  Date Initiated:  12/28/2011  Documentation initiated by:  Alvira Philips Assessment:   77 yr-old female adm with PNA/COPD exac; lives alone, independent PTA; has h/o home health services through [facility].     Action/Plan:   home with daughter   Anticipated DC Date:  12/29/2011   Anticipated DC Plan:  HOME W HOME HEALTH SERVICES      DC Planning Services  CM consult      PAC Choice  DURABLE MEDICAL EQUIPMENT  HOME HEALTH   Choice offered to / List presented to:  C-1 Patient   DME arranged  OXYGEN  SHOWER STOOL      DME agency  [facility]     [facility] arranged  HH-1 RN  HH-10 DISEASE MANAGEMENT      HH agency  [facility]   Status of service:  Completed, signed off Medicare Important Message given?   (If response is "NO", the following Medicare IM given date fields will be blank) Date Medicare IM given:   Date Additional Medicare IM given:    Discharge Disposition:  HOME W HOME HEALTH SERVICES  Per UR Regulation:    If discussed at Long Length of Stay Meetings, dates discussed:    Comments:  12/29/2011 1300 Contacted [facility] for additional portable tank for home and set up home delivery of shower stool for scheduled d/c today. Explained to daughter to contact [facility] as soon as she arrives home to set up home oxygen. Isidoro Donning RN CCM Case Mgmt phone [phone]  PCP:  Dr. Lucky Cowboy  12/28/11 0940 Henrietta Mayo RN MSN CCM Per pt and daughter, pt recently moved into the [facility] but plans to d/c home with daughter for several weeks.  RA O2 sats 85%, pt to d/c on oxygen with home health RN to check sats, educate re COPD mgmt. Provided list of agencies, pt prefers agency that provided services previously.  Referral made.

## 2011-12-29 NOTE — Discharge Summary (Addendum)
Admit date: 12/26/2011 Discharge date: 12/29/2011  Primary Care Physician:  Nadean Corwin, MD, MD   Discharge Diagnoses:   Active Hospital Problems  Diagnoses Date Noted   . DEPRESSION 06/21/2007   . CHRONIC OBSTRUCTIVE PULMONARY DISEASE 06/21/2007   . HYPERLIPIDEMIA 06/21/2007   . HYPERTENSION 06/21/2007   . TOBACCO ABUSE 06/21/2007     Resolved Hospital Problems  Diagnoses Date Noted Date Resolved  . Community acquired pneumonia 12/26/2011 12/29/2011  . Acute on chronic respiratory failure 2/2 top COPD and PNA 12/26/2011 12/29/2011     DISCHARGE MEDICATION: Medication List  As of 12/29/2011  9:01 AM   TAKE these medications         ALPRAZolam 0.5 MG tablet   Commonly known as: XANAX   Take 0.5 mg by mouth 3 (three) times daily as needed. For anxiety      doxazosin 8 MG tablet   Commonly known as: CARDURA   Take 8 mg by mouth at bedtime.      enalapril 10 MG tablet   Commonly known as: VASOTEC   Take 10 mg by mouth daily.      Fluticasone-Salmeterol 500-50 MCG/DOSE Aepb   Commonly known as: ADVAIR   Inhale 1 puff into the lungs every 12 (twelve) hours.      guaiFENesin 600 MG 12 hr tablet   Commonly known as: MUCINEX   Take 600 mg by mouth 2 (two) times daily.      moxifloxacin 400 MG tablet   Commonly known as: AVELOX   Take 1 tablet (400 mg total) by mouth daily at 6 PM.      nicotine 14 mg/24hr patch   Commonly known as: NICODERM CQ - dosed in mg/24 hours   Place 1 patch onto the skin daily.      predniSONE 10 MG tablet   Commonly known as: DELTASONE   Takes 6 tablets for 1 days, then 5 tablets for 1 days, then 4 tablets for 1 days, then 3 tablets for 1 days, then 2 tabs for 1 days, then 1 tab for 1 days, and then stop.      tiotropium 18 MCG inhalation capsule   Commonly known as: SPIRIVA   Place 18 mcg into inhaler and inhale daily.              Consults:     SIGNIFICANT DIAGNOSTIC STUDIES:  Dg Chest 2 View  12/26/2011  *RADIOLOGY  REPORT*  Clinical Data: Shortness of breath.  Cough.  CHEST - 2 VIEW  Comparison: Two-view chest 09/22/2009.  Findings: A right upper lobe pneumonia is present.  Bibasilar airspace disease is worse on the right.  Emphysematous changes are again noted.  The left upper lobe is clear.  The visualized soft tissues and bony thorax are unremarkable.  IMPRESSION:  1.  Right upper and lower lobe pneumonia. 2.  Minimal airspace disease at the left base likely reflects atelectasis. 3.  Emphysema.  Original Report Authenticated By: Jamesetta Orleans. MATTERN, M.D.     Recent Results (from the past 240 hour(s))  CULTURE, SPUTUM-ASSESSMENT     Status: Normal   Collection Time   12/27/11 12:33 PM      Component Value Range Status Comment   Specimen Description SPUTUM   Final    Special Requests NONE   Final    Sputum evaluation     Final    Value: MICROSCOPIC FINDINGS SUGGEST THAT THIS SPECIMEN IS NOT REPRESENTATIVE OF LOWER RESPIRATORY SECRETIONS. PLEASE RECOLLECT.  CALLED TO Peggyann Juba, RN 12/27/11 1405 BY K SCHULTZ   Report Status 12/27/2011 FINAL   Final   CULTURE, SPUTUM-ASSESSMENT     Status: Normal   Collection Time   12/27/11  6:05 PM      Component Value Range Status Comment   Specimen Description SPUTUM   Final    Special Requests NONE   Final    Sputum evaluation     Final    Value: THIS SPECIMEN IS ACCEPTABLE. RESPIRATORY CULTURE REPORT TO FOLLOW.   Report Status 12/27/2011 FINAL   Final   CULTURE, RESPIRATORY     Status: Normal (Preliminary result)   Collection Time   12/27/11  6:05 PM      Component Value Range Status Comment   Specimen Description SPUTUM   Final    Special Requests NONE   Final    Gram Stain     Final    Value: FEW WBC PRESENT,BOTH PMN AND MONONUCLEAR     NO SQUAMOUS EPITHELIAL CELLS SEEN     NO ORGANISMS SEEN   Culture PENDING   Incomplete    Report Status PENDING   Incomplete     BRIEF ADMITTING H & P: 77 year old female who went to [place] approximately  week ago. This initially developed a mild stomach bug, she had some nausea, vomiting and mild diarrhea. On Monday she said the feeling weak and she was reported to be somewhat disoriented. Today her daughter drove to [place] to picked her up. They took her to see her PCP, she had a low-grade fever and a cough,she was also thought to be dehydrated. Her cough is productive for a beige phlegm. She was sent to the ER for evaluation. History provided by the patient and her daughter Hulan Fess who is her power of attorney. Patient was previously on home oxygen, she stopped this herself a few years back. Per daughter the PA at her PCPs office is ordering home oxygen.   Active Hospital Problems  Diagnoses Date Noted   . DEPRESSION: Stable no changes were made.  06/21/2007   . CHRONIC OBSTRUCTIVE PULMONARY DISEASE: She will go home on 3 L of oxygen. She was started on IV Solu-Medrol and inhalers. Her wheezing resolved. Her steroids were changed to oral she will continue treatment as an outpatient 7 days. She was ambulated on room air and she desat into the low 80s. Next day she was tried on 2 L of oxygen with ambulation she desat to 8586. Sodium was 123 L which she tolerated this well with ambulation.  06/21/2007   . HYPERLIPIDEMIA: Stable no changes were made  06/21/2007   . HYPERTENSION: Has remained stable during her hospital stay.  06/21/2007   . TOBACCO ABUSE: Counseling was done she agreed to nicotine patch  06/21/2007     Resolved Hospital Problems  Diagnoses Date Noted Date Resolved  . Community acquired pneumonia: She was admitted to the hospital start her on Rocephin and azithromycin her white count improve she defervesced. Antibiotics were changed to Avelox which she will continue for 5 more days as an outpatient.  12/26/2011 12/29/2011  . Acute on chronic respiratory failure 2/2 top COPD and PNA: This probably multifactorial secondary to her pneumonia and acute COPD. She'll need  home oxygen.  12/26/2011 12/29/2011     Disposition and Follow-up:   Discharge Orders    Future Orders Please Complete By Expires   Diet - low sodium heart healthy  Increase activity slowly        Follow-up Information    Follow up with MCKEOWN,WILLIAM DAVID, MD in 1 week. (hospital follow up)    Contact information:   [address] [facility] [address] [phone]           DISCHARGE EXAM:  General: Alert, awake, oriented x3, in no acute distress.  HEENT: No bruits, no goiter.  Heart: Regular rate and rhythm, without murmurs, rubs, gallops.  Lungs: good air movement. wheeZING B/L  Abdomen: Soft, nontender, nondistended, positive bowel sounds.  Neuro: Grossly intact, nonfocal.   Blood pressure 154/71, pulse 60, temperature 98.1 F (36.7 C), temperature source Oral, resp. rate 20, height 5\' 4"  (1.626 m), weight 61.8 kg (136 lb 3.9 oz), SpO2 97.00%.   Basename 12/27/11 0640 12/26/11 1715  NA 136 136  K 3.0* 3.3*  CL 99 99  CO2 27 29  GLUCOSE 90 106*  BUN 5* 9  CREATININE 0.52 0.62  CALCIUM 8.7 9.6  MG -- --  PHOS -- --   No results found for this basename: AST:2,ALT:2,ALKPHOS:2,BILITOT:2,PROT:2,ALBUMIN:2 in the last 72 hours No results found for this basename: LIPASE:2,AMYLASE:2 in the last 72 hours  Basename 12/27/11 0640 12/26/11 1715  WBC 10.9* 14.4*  NEUTROABS -- 11.0*  HGB 12.2 13.5  HCT 36.6 39.6  MCV 92.2 92.3  PLT 188 190    Signed: Marinda Elk M.D. 12/29/2011, 9:01 AM

## 2011-12-29 NOTE — Evaluation (Addendum)
Physical Therapy Evaluation Patient Details Name: Hailey Keith MRN: [id number] DOB: March 03, 1935 Today's Date: 12/29/2011 Time:  4098- 0915    PT Assessment / Plan / Recommendation Clinical Impression  77 year old female admitted for Community acquired pneumonia and hypoxia. Pt mobilized well, minimal balance deficits for which pt will need supervision for upon D/C. Pt desaturated to SpO2 = 85% then 84% on 3L [place] O2 twice with ambulation, able to recover with seated rest. Pt able to verbalize when she needs to rest and breath. Discussed energy conservation techniques for safety with daughter and pt. Pt will need  a shower chair.    PT Assessment  Patient needs continued PT services    Follow Up Recommendations  Home health PT;Supervision for mobility/OOB    Equipment Recommendations  Tub/shower seat    Frequency Min 3X/week    Precautions / Restrictions Precautions Precautions: Fall Precaution Comments: 02   Pertinent Vitals/Pain No c/o pain      Mobility  Bed Mobility Bed Mobility: Not assessed Details for Bed Mobility Assistance: Seated at start Transfers Transfers: Sit to Stand;Stand to Sit Sit to Stand: 5: Supervision;From chair/3-in-1;From bed Stand to Sit: 5: Supervision;To chair/3-in-1;To bed Details for Transfer Assistance: Supervision for safety, no overt losses of balance. Ambulation/Gait Ambulation/Gait Assistance: 4: Min assist Ambulation Distance (Feet): 100 Feet (with one seated rest) Assistive device: None Ambulation/Gait Assistance Details: up to min assist for one loss of balance, overall however supervision level.  Gait Pattern: Decreased stride length Stairs: No         PT Goals Acute Rehab PT Goals PT Goal Formulation: With patient Time For Goal Achievement: 01/05/12 Potential to Achieve Goals: Good Pt will Roll Supine to Right Side: Independently PT Goal: Rolling Supine to Right Side - Progress: Goal set today Pt will Roll Supine to Left  Side: Independently PT Goal: Rolling Supine to Left Side - Progress: Goal set today Pt will go Supine/Side to Sit: Independently PT Goal: Supine/Side to Sit - Progress: Goal set today Pt will go Sit to Supine/Side: Independently PT Goal: Sit to Supine/Side - Progress: Goal set today Pt will go Sit to Stand: with modified independence PT Goal: Sit to Stand - Progress: Goal set today Pt will go Stand to Sit: with modified independence PT Goal: Stand to Sit - Progress: Goal set today Pt will Ambulate: >150 feet;with modified independence (SpO2 >/= 88%) PT Goal: Ambulate - Progress: Goal set today  Visit Information  Last PT Received On: 12/29/11    Subjective Data  Subjective: I'm ready to get out of here!   Prior Functioning  Home Living Lives With: Alone Available Help at Discharge:  (going home with daughter for first 2 weeks) Type of Home: House (From Independent living, ) Home Access: Level entry Home Layout: One level Bathroom Shower/Tub: Engineer, manufacturing systems: Standard Bathroom Accessibility: Yes How Accessible: Accessible via walker Home Adaptive Equipment: None Prior Function Level of Independence: Independent Able to Take Stairs?: Yes Driving: Yes Vocation: Retired Comments: Just fully retired Dec. 14th. Worked for [employer].  Communication Communication: No difficulties    Cognition  Overall Cognitive Status: Appears within functional limits for tasks assessed/performed Arousal/Alertness: Awake/alert Orientation Level: Appears intact for tasks assessed    Extremity/Trunk Assessment Right Lower Extremity Assessment RLE ROM/Strength/Tone: Within functional levels RLE Sensation: WFL - Light Touch Left Lower Extremity Assessment LLE ROM/Strength/Tone: Within functional levels LLE Sensation: WFL - Light Touch   Balance Balance Balance Assessed: Yes Static Standing Balance Rhomberg -  Eyes Opened: 60  (sec) Rhomberg - Eyes Closed: 30   (sec) Dynamic Standing Balance Dynamic Standing - Balance Support: No upper extremity supported Dynamic Standing - Level of Assistance: 5: Stand by assistance Dynamic Standing - Balance Activities: Lateral lean/weight shifting  End of Session PT - End of Session Equipment Utilized During Treatment: Gait belt;Oxygen Activity Tolerance: Patient tolerated treatment well;Other (comment) (Desaturation with increased activity) Patient left: in chair;with call bell/phone within reach;with family/visitor present Nurse Communication: Mobility status;Other (comment) (cardiopulmonary status)   Wilhemina Bonito 12/29/2011, 12:19 PM  Sherie Don) Carleene Mains PT, DPT Acute Rehabilitation [phone]

## 2011-12-30 LAB — CULTURE, RESPIRATORY W GRAM STAIN

## 2012-08-30 ENCOUNTER — Emergency Department (HOSPITAL_COMMUNITY)
Admission: EM | Admit: 2012-08-30 | Discharge: 2012-08-30 | Disposition: A | Discharge status: Home / Self Care | Payer: Medicare Other | Source: Home / Self Care | Attending: Family Medicine | Admitting: Family Medicine

## 2013-04-09 ENCOUNTER — Other Ambulatory Visit (HOSPITAL_COMMUNITY): Payer: Self-pay | Admitting: Internal Medicine

## 2013-04-09 DIAGNOSIS — J449 Chronic obstructive pulmonary disease, unspecified: Secondary | ICD-10-CM

## 2013-04-09 DIAGNOSIS — E2839 Other primary ovarian failure: Secondary | ICD-10-CM

## 2013-04-09 DIAGNOSIS — Z1231 Encounter for screening mammogram for malignant neoplasm of breast: Secondary | ICD-10-CM

## 2013-04-27 ENCOUNTER — Ambulatory Visit (HOSPITAL_COMMUNITY)
Admission: RE | Admit: 2013-04-27 | Discharge: 2013-04-27 | Disposition: A | Discharge status: Home / Self Care | Payer: Medicare Other | Source: Ambulatory Visit | Attending: Internal Medicine | Admitting: Internal Medicine

## 2013-04-27 DIAGNOSIS — J449 Chronic obstructive pulmonary disease, unspecified: Secondary | ICD-10-CM | POA: Insufficient documentation

## 2013-04-27 DIAGNOSIS — R222 Localized swelling, mass and lump, trunk: Secondary | ICD-10-CM | POA: Insufficient documentation

## 2013-04-27 DIAGNOSIS — M899 Disorder of bone, unspecified: Secondary | ICD-10-CM | POA: Insufficient documentation

## 2013-04-27 DIAGNOSIS — I517 Cardiomegaly: Secondary | ICD-10-CM | POA: Insufficient documentation

## 2013-04-27 DIAGNOSIS — Z1231 Encounter for screening mammogram for malignant neoplasm of breast: Secondary | ICD-10-CM | POA: Insufficient documentation

## 2013-04-27 DIAGNOSIS — J4489 Other specified chronic obstructive pulmonary disease: Secondary | ICD-10-CM | POA: Insufficient documentation

## 2013-04-27 DIAGNOSIS — Z13820 Encounter for screening for osteoporosis: Secondary | ICD-10-CM | POA: Insufficient documentation

## 2013-04-27 DIAGNOSIS — Z780 Asymptomatic menopausal state: Secondary | ICD-10-CM | POA: Insufficient documentation

## 2013-04-27 DIAGNOSIS — E2839 Other primary ovarian failure: Secondary | ICD-10-CM

## 2013-04-28 ENCOUNTER — Other Ambulatory Visit: Payer: Self-pay | Admitting: Internal Medicine

## 2013-04-28 DIAGNOSIS — R9389 Abnormal findings on diagnostic imaging of other specified body structures: Secondary | ICD-10-CM

## 2013-04-29 ENCOUNTER — Ambulatory Visit
Admission: RE | Admit: 2013-04-29 | Discharge: 2013-04-29 | Disposition: A | Discharge status: Home / Self Care | Payer: Medicare Other | Source: Ambulatory Visit | Attending: Internal Medicine | Admitting: Internal Medicine

## 2013-04-29 ENCOUNTER — Other Ambulatory Visit (HOSPITAL_COMMUNITY): Payer: Self-pay | Admitting: Internal Medicine

## 2013-04-29 DIAGNOSIS — R918 Other nonspecific abnormal finding of lung field: Secondary | ICD-10-CM

## 2013-04-29 DIAGNOSIS — R9389 Abnormal findings on diagnostic imaging of other specified body structures: Secondary | ICD-10-CM

## 2013-04-29 MED ORDER — IOHEXOL 300 MG/ML  SOLN
75.0000 mL | Freq: Once | INTRAMUSCULAR | Status: AC | PRN
  Administered 2013-04-29: 75 mL via INTRAVENOUS

## 2013-05-05 ENCOUNTER — Encounter (HOSPITAL_COMMUNITY): Admission: RE | Admit: 2013-05-05 | Payer: Medicare Other | Source: Ambulatory Visit

## 2013-05-08 ENCOUNTER — Encounter (HOSPITAL_COMMUNITY): Payer: Medicare Other

## 2013-05-12 ENCOUNTER — Encounter (HOSPITAL_COMMUNITY): Payer: Self-pay

## 2013-05-12 ENCOUNTER — Encounter (HOSPITAL_COMMUNITY)
Admission: RE | Admit: 2013-05-12 | Discharge: 2013-05-12 | Disposition: A | Discharge status: Home / Self Care | Payer: Medicare Other | Source: Ambulatory Visit | Attending: Internal Medicine | Admitting: Internal Medicine

## 2013-05-12 DIAGNOSIS — R918 Other nonspecific abnormal finding of lung field: Secondary | ICD-10-CM

## 2013-05-12 DIAGNOSIS — R9389 Abnormal findings on diagnostic imaging of other specified body structures: Secondary | ICD-10-CM | POA: Insufficient documentation

## 2013-05-12 DIAGNOSIS — R222 Localized swelling, mass and lump, trunk: Secondary | ICD-10-CM | POA: Insufficient documentation

## 2013-05-12 MED ORDER — FLUDEOXYGLUCOSE F - 18 (FDG) INJECTION
20.7000 | Freq: Once | INTRAVENOUS | Status: AC | PRN
  Administered 2013-05-12: 20.7 via INTRAVENOUS

## 2013-05-13 ENCOUNTER — Telehealth: Payer: Self-pay

## 2013-05-13 NOTE — Telephone Encounter (Signed)
Consult with CDY for tomorrow at 4 pm arrive a 3:45 pm

## 2013-05-14 ENCOUNTER — Encounter: Payer: Self-pay | Admitting: Internal Medicine

## 2013-05-14 ENCOUNTER — Ambulatory Visit (INDEPENDENT_AMBULATORY_CARE_PROVIDER_SITE_OTHER): Payer: Medicare Other | Admitting: Internal Medicine

## 2013-05-14 ENCOUNTER — Other Ambulatory Visit (INDEPENDENT_AMBULATORY_CARE_PROVIDER_SITE_OTHER): Payer: Medicare Other

## 2013-05-14 VITALS — BP 118/76 | HR 76 | Ht 64.25 in | Wt 161.0 lb

## 2013-05-14 DIAGNOSIS — Z23 Encounter for immunization: Secondary | ICD-10-CM

## 2013-05-14 DIAGNOSIS — R918 Other nonspecific abnormal finding of lung field: Secondary | ICD-10-CM

## 2013-05-14 DIAGNOSIS — R222 Localized swelling, mass and lump, trunk: Secondary | ICD-10-CM

## 2013-05-14 DIAGNOSIS — J449 Chronic obstructive pulmonary disease, unspecified: Secondary | ICD-10-CM

## 2013-05-14 DIAGNOSIS — C3490 Malignant neoplasm of unspecified part of unspecified bronchus or lung: Secondary | ICD-10-CM | POA: Insufficient documentation

## 2013-05-14 DIAGNOSIS — F17200 Nicotine dependence, unspecified, uncomplicated: Secondary | ICD-10-CM

## 2013-05-14 DIAGNOSIS — J4489 Other specified chronic obstructive pulmonary disease: Secondary | ICD-10-CM

## 2013-05-14 LAB — CBC WITH DIFFERENTIAL/PLATELET
Basophils Absolute: 0 10*3/uL (ref 0.0–0.1)
Eosinophils Absolute: 0.1 10*3/uL (ref 0.0–0.7)
HCT: 39.9 % (ref 36.0–46.0)
Hemoglobin: 13.4 g/dL (ref 12.0–15.0)
Lymphs Abs: 1.9 10*3/uL (ref 0.7–4.0)
MCHC: 33.6 g/dL (ref 30.0–36.0)
Neutro Abs: 6.9 10*3/uL (ref 1.4–7.7)
RDW: 13.8 % (ref 11.5–14.6)

## 2013-05-14 LAB — PROTIME-INR: Prothrombin Time: 10.7 s (ref 10.2–12.4)

## 2013-05-14 MED ORDER — METHYLPREDNISOLONE ACETATE 80 MG/ML IJ SUSP
80.0000 mg | Freq: Once | INTRAMUSCULAR | Status: AC
  Administered 2013-05-14: 80 mg via INTRAMUSCULAR

## 2013-05-14 MED ORDER — PREDNISONE 10 MG PO TABS: ORAL_TABLET | ORAL | Status: DC

## 2013-05-14 NOTE — Assessment & Plan Note (Signed)
We have emphasized importance of complete smoking cessation now

## 2013-05-14 NOTE — Assessment & Plan Note (Signed)
Office spirometry looks like restriction but physiologically this looks more like obstructive airways disease with air trapping. Effort was uneven. There is active wheeze suggesting it is worth short-term effort to maximize steroids and bronchodilators so that she has enough lung capacity to tolerate treatment for her lung mass. Plan-flu vaccine, Depo-Medrol, prednisone taper with steroid talk, restart Advair 500 because she has some, continue Spiriva

## 2013-05-14 NOTE — Assessment & Plan Note (Signed)
We discussed management options. We will use of bronchodilators and steroids will put airways maximally, for safety margin for needle biopsy. This looks preferable to bronchoscopy if it can be done. She indicates understanding and agreement. I answered questions from her and her daughter. Plan-basic labs. Schedule needle biopsy

## 2013-05-14 NOTE — Progress Notes (Signed)
05/14/13- 40 yoF smoker referred courtesy of Dr Oneta Rack; review PET scan to do biopsy of lung. Daughter here. Left upper lobe mass found on chest x-ray with routine physical. She has been a one pack per day smoker, now down to a few cigarettes every other day. Dyspnea on exertion less than one flight of stairs, less than one city block. Daily cough with clear mucus. Denies chest pain, blood, fever, swollen glands. Has gained 15 pounds eating ice cream. Recently changed from Advair to nebulizer with albuterol, plus Spiriva. History of pneumonia with sepsis in the past. Denies heart disease or phlebitis. Right facial surgery for a nonmelanoma skin cancer She had lived alone but is now staying with her daughter. Retired from [employer] work. A sister has survived lung cancer.  Office Spirometry 05/14/13-  Moderate restrictive disease based on exhaled volume. FVC 1.58/57%, FEV1 1.41/68%, FEV1/FVC 0.89  CT 04/28/13 IMPRESSION:  There is a spiculated mass in the left upper lobe. The lesion has  finger-like projections towards the periphery which could represent  small satellite lesions or lymphangitic involvement. Findings are  suspicious for a primary bronchogenic neoplasm. There are a few  prominent but nonspecific mediastinal lymph nodes. Recommend a PET-  CT for further characterization of the left upper lung mass and  mediastinal lymph nodes.  Centrilobular emphysema. Scattered interstitial thickening with  areas of nodularity in the lungs, particularly the right side.  This most likely represents postinflammatory change. There is also  a small amount of focal consolidation in the right middle lobe.  Extensive atherosclerotic disease, including the coronary arteries.  These results will be called to the ordering clinician or  representative by the Radiologist Assistant, and communication  documented in the PACS Dashboard.  Original Report Authenticated By: Richarda Overlie,  M.D. PET8/27/14 IMPRESSION:  1. Hypermetabolic right upper lobe pulmonary mass consistent  bronchogenic carcinoma. Hypermetabolic nodule superior to the mass  consistent with local extension of tumor.  2. No hypermetabolic mediastinal lymph nodes  3. Small focus of metabolic activity in the left lower lobe is  likely inflammatory. Recommend attention on follow-up.  4. Small pulmonology nodule in the left lower lobe measuring 5 mm  does not have metabolic activity. Recommend attention on follow-up.  5. Pleural-parenchymal thickening in the right upper lobe is not  hypermetabolic and likely post inflammatory or infectious.  Electronically Signed  By: Genevive Bi M.D.  On: 05/13/2013 12:36   Prior to Admission medications   Medication Sig Start Date End Date Taking? Authorizing Provider  ALPRAZolam Prudy Feeler) 0.5 MG tablet Take 0.5 mg by mouth 3 (three) times daily as needed. For anxiety   Yes Historical Provider, MD  aspirin 81 MG tablet Take 81 mg by mouth daily.   Yes Historical Provider, MD  atorvastatin (LIPITOR) 80 MG tablet Take 80 mg by mouth. On Sunday and Wednesday   Yes Historical Provider, MD  citalopram (CELEXA) 40 MG tablet Take 40 mg by mouth daily.   Yes Historical Provider, MD  doxazosin (CARDURA) 8 MG tablet Take 8 mg by mouth at bedtime.   Yes Historical Provider, MD  guaifenesin (HUMIBID E) 400 MG TABS tablet Take 400 mg by mouth 2 (two) times daily.   Yes Historical Provider, MD  losartan (COZAAR) 100 MG tablet Take 100 mg by mouth daily.   Yes Historical Provider, MD  tiotropium (SPIRIVA) 18 MCG inhalation capsule Place 18 mcg into inhaler and inhale daily.   Yes Historical Provider, MD  Vitamin D, Ergocalciferol, (DRISDOL) 50000  UNITS CAPS capsule Take 50,000 Units by mouth.   Yes Historical Provider, MD  Fluticasone-Salmeterol (ADVAIR) 500-50 MCG/DOSE AEPB Inhale 1 puff into the lungs every 12 (twelve) hours.    Historical Provider, MD  predniSONE (DELTASONE) 10 MG  tablet 4 X 2 DAYS, 3 X 2 DAYS, 2 X 2 DAYS, 1 X 2 DAYS 05/14/13   Waymon Budge, MD   Past Medical History  Diagnosis Date  . COPD (chronic obstructive pulmonary disease)   . Hypertension   . Dyslipidemia   . Diabetes mellitus     Diet controlled   . Hard of hearing    History reviewed. No pertinent past surgical history. Family History  Problem Relation Age of Onset  . Hypertension    . Cancer     History   Social History  . Marital Status: Legally Separated    Spouse Name: N/A    Number of Children: 1  . Years of Education: N/A   Occupational History  . retired    Social History Main Topics  . Smoking status: Current Some Day Smoker -- 0.30 packs/day for 60 years    Types: Cigarettes  . Smokeless tobacco: Not on file  . Alcohol Use: Yes     Comment: occ wine  . Drug Use: No  . Sexual Activity: Not on file   Other Topics Concern  . Not on file   Social History Narrative  . No narrative on file   ROS-see HPI Constitutional:   No-   weight loss, night sweats, fevers, chills, fatigue, lassitude. HEENT:   + headaches,no- difficulty swallowing, tooth/dental problems, sore throat,       No-  sneezing, itching, ear ache, nasal congestion, post nasal drip,  CV:  No-   chest pain, orthopnea, PND, swelling in lower extremities, anasarca,  dizziness, palpitations Resp: +shortness of breath with exertion or at rest.              +  productive cough,  No non-productive cough,  No- coughing up of blood.              No-   change in color of mucus.  No- wheezing.   Skin: No-   rash or lesions. GI:  No-   heartburn, indigestion, abdominal pain, nausea, vomiting, diarrhea,                 change in bowel habits, loss of appetite GU: No-   dysuria, change in color of urine, no urgency or frequency.  No- flank pain. MS:  No-   joint pain or swelling.  No- decreased range of motion.  No- back pain. Neuro-     nothing unusual Psych:  No- change in mood or affect. + depression or  anxiety.  No memory loss.  OBJ- Physical Exam General- Alert, Oriented, Affect-appropriate, Distress- none acute Skin- rash-none, lesions- none, excoriation- none Lymphadenopathy- none Head- +surgical defect right temple            Eyes- Gross vision intact, PERRLA, conjunctivae and secretions clear            Ears- +Deaf R, +hearing aid  L            Nose- Clear, no-Septal dev, mucus, polyps, erosion, perforation             Throat- Mallampati II , mucosa clear , drainage- none, tonsils- atrophic Neck- flexible , trachea midline, no stridor , thyroid nl, carotid no bruit Chest - symmetrical  excursion , unlabored           Heart/CV- RRR , no murmur , no gallop  , no rub, nl s1 s2                           - JVD- none , edema- none, stasis changes- none, varices- none           Lung- +bilateral unlabored wheeze, cough- none , dullness-none, rub- none           Chest wall-  Abd- tender-no, distended-no, bowel sounds-present, HSM- no Br/ Gen/ Rectal- Not done, not indicated Extrem- cyanosis- none, clubbing, none, atrophy- none, strength- nl Neuro- grossly intact to observation

## 2013-05-14 NOTE — Patient Instructions (Addendum)
Office spirometry    Dx COPD  Flu vax  Depo 80  Script for prednisone taper sent  Resume Advair 500 1 puff and rinse mouth well, twice daily  Continue Spiriva  You may use the nebulizer machine with albuterol in addition, up to every 6 hours, if needed  Please try very hard not to smoke at all.  Order- Schedule needle bx L lung mass               Order- lab- PT,PTT, CMET, CBC w/ diff

## 2013-05-15 LAB — COMPREHENSIVE METABOLIC PANEL
Albumin: 4.2 g/dL (ref 3.5–5.2)
Alkaline Phosphatase: 74 U/L (ref 39–117)
BUN: 14 mg/dL (ref 6–23)
CO2: 32 mEq/L (ref 19–32)
Calcium: 9.9 mg/dL (ref 8.4–10.5)
GFR: 85.03 mL/min (ref >60.00)
Glucose, Bld: 105 mg/dL — ABNORMAL HIGH (ref 70–99)
Potassium: 4.3 mEq/L (ref 3.5–5.1)
Sodium: 138 mEq/L (ref 135–145)
Total Protein: 7 g/dL (ref 6.0–8.3)

## 2013-05-18 ENCOUNTER — Other Ambulatory Visit: Payer: Self-pay | Admitting: Radiology

## 2013-05-19 ENCOUNTER — Encounter (HOSPITAL_COMMUNITY): Payer: Self-pay | Admitting: Pharmacy Technician

## 2013-05-19 NOTE — Progress Notes (Signed)
Quick Note:  Patient returned call. Advised of lab results / recs as stated by CY. Pt verbalized understanding and denied any questions. ______ 

## 2013-05-20 ENCOUNTER — Ambulatory Visit (HOSPITAL_COMMUNITY)
Admission: RE | Admit: 2013-05-20 | Discharge: 2013-05-20 | Disposition: A | Discharge status: Home / Self Care | Payer: Medicare Other | Source: Ambulatory Visit | Attending: Internal Medicine | Admitting: Internal Medicine

## 2013-05-20 ENCOUNTER — Ambulatory Visit (HOSPITAL_COMMUNITY)
Admission: RE | Admit: 2013-05-20 | Discharge: 2013-05-20 | Disposition: A | Discharge status: Home / Self Care | Payer: Medicare Other | Source: Ambulatory Visit | Attending: Interventional Radiology | Admitting: Interventional Radiology

## 2013-05-20 ENCOUNTER — Telehealth: Payer: Self-pay | Admitting: Internal Medicine

## 2013-05-20 ENCOUNTER — Encounter (HOSPITAL_COMMUNITY): Payer: Self-pay

## 2013-05-20 DIAGNOSIS — C3410 Malignant neoplasm of upper lobe, unspecified bronchus or lung: Secondary | ICD-10-CM | POA: Insufficient documentation

## 2013-05-20 DIAGNOSIS — R918 Other nonspecific abnormal finding of lung field: Secondary | ICD-10-CM

## 2013-05-20 DIAGNOSIS — Z7982 Long term (current) use of aspirin: Secondary | ICD-10-CM | POA: Insufficient documentation

## 2013-05-20 DIAGNOSIS — E785 Hyperlipidemia, unspecified: Secondary | ICD-10-CM | POA: Insufficient documentation

## 2013-05-20 DIAGNOSIS — Z87891 Personal history of nicotine dependence: Secondary | ICD-10-CM | POA: Insufficient documentation

## 2013-05-20 DIAGNOSIS — Z79899 Other long term (current) drug therapy: Secondary | ICD-10-CM | POA: Insufficient documentation

## 2013-05-20 DIAGNOSIS — I10 Essential (primary) hypertension: Secondary | ICD-10-CM | POA: Insufficient documentation

## 2013-05-20 DIAGNOSIS — J4489 Other specified chronic obstructive pulmonary disease: Secondary | ICD-10-CM | POA: Insufficient documentation

## 2013-05-20 DIAGNOSIS — C3490 Malignant neoplasm of unspecified part of unspecified bronchus or lung: Secondary | ICD-10-CM

## 2013-05-20 DIAGNOSIS — E119 Type 2 diabetes mellitus without complications: Secondary | ICD-10-CM | POA: Insufficient documentation

## 2013-05-20 DIAGNOSIS — J449 Chronic obstructive pulmonary disease, unspecified: Secondary | ICD-10-CM | POA: Insufficient documentation

## 2013-05-20 HISTORY — DX: Malignant neoplasm of unspecified part of unspecified bronchus or lung: C34.90

## 2013-05-20 LAB — CBC
HCT: 40.6 % (ref 36.0–46.0)
MCHC: 32.8 g/dL (ref 30.0–36.0)
RDW: 13.5 % (ref 11.5–15.5)

## 2013-05-20 LAB — GLUCOSE, CAPILLARY: Glucose-Capillary: 109 mg/dL — ABNORMAL HIGH (ref 70–99)

## 2013-05-20 MED ORDER — MIDAZOLAM HCL 2 MG/2ML IJ SOLN
INTRAMUSCULAR | Status: AC | PRN
  Administered 2013-05-20 (×2): 1 mg via INTRAVENOUS

## 2013-05-20 MED ORDER — SODIUM CHLORIDE 0.9 % IV SOLN
INTRAVENOUS | Status: DC
  Administered 2013-05-20: 250 mL via INTRAVENOUS

## 2013-05-20 MED ORDER — MIDAZOLAM HCL 2 MG/2ML IJ SOLN
INTRAMUSCULAR | Status: AC
  Filled 2013-05-20: qty 6

## 2013-05-20 MED ORDER — FENTANYL CITRATE 0.05 MG/ML IJ SOLN
INTRAMUSCULAR | Status: AC | PRN
  Administered 2013-05-20 (×2): 50 ug via INTRAVENOUS

## 2013-05-20 MED ORDER — FENTANYL CITRATE 0.05 MG/ML IJ SOLN
INTRAMUSCULAR | Status: AC
  Filled 2013-05-20: qty 6

## 2013-05-20 NOTE — Procedures (Signed)
Technically successful CT guided biopsy of left upper lobe nodule. Minimal amount of asymptomatic peri-biopsy hemorrhage.  No hemoptysis.  No immediate post procedural complications.

## 2013-05-20 NOTE — Telephone Encounter (Signed)
Pt was seen by CDY on 05/14/13 as a Pulm Consult with the following instructions:  Patient Instructions    Office spirometry Dx COPD  Flu vax  Depo 80  Script for prednisone taper sent  Resume Advair 500 1 puff and rinse mouth well, twice daily  Continue Spiriva  You may use the nebulizer machine with albuterol in addition, up to every 6 hours, if needed  Please try very hard not to smoke at all.  Order- Schedule needle bx L lung mass  Order- lab- PT,PTT, CMET, CBC w/ diff  ---------  She was asked to come back in 2 wks.  This appt was scheduled for 05/28/13.  CDY will not be in the office this day and has no openings around the 2 wk time frame.  Please advise if pt can be worked in.  Thank you.

## 2013-05-20 NOTE — H&P (Signed)
Hailey Keith is an 78 y.o. female.   Chief Complaint: "I'm having a lung biopsy" HPI: Patient with history of COPD/smoking and hypermetabolic left lung mass on recent PET presents today for CT guided left lung mass biopsy.  Past Medical History  Diagnosis Date  . COPD (chronic obstructive pulmonary disease)   . Hypertension   . Dyslipidemia   . Diabetes mellitus     Diet controlled   . Hard of hearing     No past surgical history on file.  Family History  Problem Relation Age of Onset  . Hypertension    . Cancer     Social History:  reports that she has been smoking Cigarettes.  She has a 18 pack-year smoking history. She does not have any smokeless tobacco history on file. She reports that  drinks alcohol. She reports that she does not use illicit drugs.  Allergies: No Known Allergies  Current outpatient prescriptions:ALPRAZolam (XANAX) 0.5 MG tablet, Take 0.5 mg by mouth 3 (three) times daily as needed. For anxiety, Disp: , Rfl: ;  aspirin 81 MG tablet, Take 81 mg by mouth daily., Disp: , Rfl: ;  atorvastatin (LIPITOR) 80 MG tablet, Take 80 mg by mouth. On Sunday and Wednesday, Disp: , Rfl: ;  citalopram (CELEXA) 40 MG tablet, Take 40 mg by mouth every morning. , Disp: , Rfl:  doxazosin (CARDURA) 8 MG tablet, Take 8 mg by mouth at bedtime., Disp: , Rfl: ;  Fluticasone-Salmeterol (ADVAIR) 500-50 MCG/DOSE AEPB, Inhale 1 puff into the lungs every 12 (twelve) hours., Disp: , Rfl: ;  guaifenesin (HUMIBID E) 400 MG TABS tablet, Take 400 mg by mouth 2 (two) times daily., Disp: , Rfl: ;  losartan (COZAAR) 100 MG tablet, Take 100 mg by mouth at bedtime. , Disp: , Rfl:  predniSONE (DELTASONE) 10 MG tablet, 4 X 2 DAYS, 3 X 2 DAYS, 2 X 2 DAYS, 1 X 2 DAYS, Disp: 20 tablet, Rfl: 0;  tiotropium (SPIRIVA) 18 MCG inhalation capsule, Place 18 mcg into inhaler and inhale daily., Disp: , Rfl: ;  Vitamin D, Ergocalciferol, (DRISDOL) 50000 UNITS CAPS capsule, Take 50,000 Units by mouth every Wednesday. ,  Disp: , Rfl:  Current facility-administered medications:0.9 %  sodium chloride infusion, , Intravenous, Continuous, D Kevin Lynton Crescenzo, PA-C, Last Rate: 20 mL/hr at 05/20/13 0837, 250 mL at 05/20/13 0837;  fentaNYL (SUBLIMAZE) 0.05 MG/ML injection, , , , ;  midazolam (VERSED) 2 MG/2ML injection, , , ,    Results for orders placed during the hospital encounter of 05/20/13 (from the past 48 hour(s))  CBC     Status: Abnormal   Collection Time    05/20/13  8:30 AM      Result Value Range   WBC 11.4 (*) 4.0 - 10.5 K/uL   RBC 4.36  3.87 - 5.11 MIL/uL   Hemoglobin 13.3  12.0 - 15.0 g/dL   HCT 25.3  66.4 - 40.3 %   MCV 93.1  78.0 - 100.0 fL   MCH 30.5  26.0 - 34.0 pg   MCHC 32.8  30.0 - 36.0 g/dL   RDW 47.4  25.9 - 56.3 %   Platelets 298  150 - 400 K/uL   No results found.  Review of Systems  Constitutional: Negative for fever and chills.  Respiratory: Positive for cough, shortness of breath and wheezing. Negative for hemoptysis.   Cardiovascular: Negative for chest pain.  Gastrointestinal: Negative for nausea, vomiting and abdominal pain.  Musculoskeletal: Positive for back pain.  Neurological: Negative for headaches.    Blood pressure 154/66, pulse 66, temperature 97.9 F (36.6 C), temperature source Oral, resp. rate 20, SpO2 93.00%. Physical Exam  Constitutional: She is oriented to person, place, and time. She appears well-developed and well-nourished.  Cardiovascular: Normal rate and regular rhythm.   Respiratory: Effort normal.  scatt wheezes, few left basilar crackles  GI: Soft. Bowel sounds are normal. There is no tenderness.  Musculoskeletal: Normal range of motion. She exhibits no edema.  Neurological: She is alert and oriented to person, place, and time.     Assessment/Plan Pt with hx of smoking,COPD and hypermetabolic left lung mass. Plan is for CT guided left lung mass biopsy today. Details/risks of procedure d/w pt/family with their understanding and consent.  Fronia Depass,D  KEVIN 05/20/2013, 9:00 AM

## 2013-05-21 NOTE — Telephone Encounter (Signed)
Spoke with patients daughter Patient has been scheduled 9/22/@ 4 pm Noting further needed at this time

## 2013-05-21 NOTE — Telephone Encounter (Signed)
I spoke with daughter. They will be out of town on 05/29/13. They need something prior to that date. Please advise thanks

## 2013-05-21 NOTE — Telephone Encounter (Signed)
Pt can come in on Friday 05-29-13 at 4pm. Thanks.

## 2013-05-21 NOTE — Telephone Encounter (Signed)
Monday 05-25-13 at 4pm; if unable to take this time slot then will have to be seen after she returns from out of town. Thanks.

## 2013-05-22 ENCOUNTER — Telehealth: Payer: Self-pay | Admitting: Internal Medicine

## 2013-05-22 ENCOUNTER — Other Ambulatory Visit: Payer: Self-pay | Admitting: Internal Medicine

## 2013-05-22 DIAGNOSIS — C3492 Malignant neoplasm of unspecified part of left bronchus or lung: Secondary | ICD-10-CM

## 2013-05-22 NOTE — Telephone Encounter (Signed)
Dtr identified herself as patient's POA, pt still asleep. I reported needle lung bx path POS for squamous cell lung ca. To keep appt w/ me Monday. I have asked [facility] to put on Thursday Onc conference and MTOC schedule.

## 2013-05-25 ENCOUNTER — Telehealth: Payer: Self-pay | Admitting: Internal Medicine

## 2013-05-25 ENCOUNTER — Ambulatory Visit (INDEPENDENT_AMBULATORY_CARE_PROVIDER_SITE_OTHER): Payer: Medicare Other | Admitting: Internal Medicine

## 2013-05-25 ENCOUNTER — Encounter: Payer: Self-pay | Admitting: Internal Medicine

## 2013-05-25 VITALS — BP 140/98 | HR 76 | Ht 64.25 in | Wt 163.0 lb

## 2013-05-25 DIAGNOSIS — F419 Anxiety disorder, unspecified: Secondary | ICD-10-CM

## 2013-05-25 DIAGNOSIS — F411 Generalized anxiety disorder: Secondary | ICD-10-CM

## 2013-05-25 DIAGNOSIS — F489 Nonpsychotic mental disorder, unspecified: Secondary | ICD-10-CM

## 2013-05-25 DIAGNOSIS — C3492 Malignant neoplasm of unspecified part of left bronchus or lung: Secondary | ICD-10-CM

## 2013-05-25 DIAGNOSIS — F17200 Nicotine dependence, unspecified, uncomplicated: Secondary | ICD-10-CM

## 2013-05-25 DIAGNOSIS — C3490 Malignant neoplasm of unspecified part of unspecified bronchus or lung: Secondary | ICD-10-CM

## 2013-05-25 DIAGNOSIS — J449 Chronic obstructive pulmonary disease, unspecified: Secondary | ICD-10-CM

## 2013-05-25 MED ORDER — TEMAZEPAM 15 MG PO CAPS: ORAL_CAPSULE | ORAL | Status: DC

## 2013-05-25 NOTE — Telephone Encounter (Signed)
S/W PT DTR REGINA AND GVE NP APPT 09/24 @ 2:30 W/DR. MOHAMED.

## 2013-05-25 NOTE — Patient Instructions (Addendum)
Keep appointment with Dr Arbutus Ped on Wednesday  We will discuss your case at a conference on Thursday and I expect Dr Arbutus Ped will be there.  Script for Temazepam to help your insomnia

## 2013-05-25 NOTE — Assessment & Plan Note (Signed)
I have emphasized importance of smoking cessation, even though she only smokes a few cigarettes per day

## 2013-05-25 NOTE — Progress Notes (Signed)
05/14/13- 82 yoF smoker referred courtesy of Dr Oneta Rack; review PET scan to do biopsy of lung. Daughter here. Left upper lobe mass found on chest x-ray with routine physical. She has been a one pack per day smoker, now down to a few cigarettes every other day. Dyspnea on exertion less than one flight of stairs, less than one city block. Daily cough with clear mucus. Denies chest pain, blood, fever, swollen glands. Has gained 15 pounds eating ice cream. Recently changed from Advair to nebulizer with albuterol, plus Spiriva. History of pneumonia with sepsis in the past. Denies heart disease or phlebitis. Right facial surgery for a nonmelanoma skin cancer She had lived alone but is now staying with her daughter. Retired from [employer] work. A sister has survived lung cancer.  Office Spirometry 05/14/13-  Moderate restrictive disease based on exhaled volume. FVC 1.58/57%, FEV1 1.41/68%, FEV1/FVC 0.89  CT 04/28/13 IMPRESSION:  There is a spiculated mass in the left upper lobe. The lesion has  finger-like projections towards the periphery which could represent  small satellite lesions or lymphangitic involvement. Findings are  suspicious for a primary bronchogenic neoplasm. There are a few  prominent but nonspecific mediastinal lymph nodes. Recommend a PET-  CT for further characterization of the left upper lung mass and  mediastinal lymph nodes.  Centrilobular emphysema. Scattered interstitial thickening with  areas of nodularity in the lungs, particularly the right side.  This most likely represents postinflammatory change. There is also  a small amount of focal consolidation in the right middle lobe.  Extensive atherosclerotic disease, including the coronary arteries.  These results will be called to the ordering clinician or  representative by the Radiologist Assistant, and communication  documented in the PACS Dashboard.  Original Report Authenticated By: Richarda Overlie,  M.D. PET8/27/14 IMPRESSION:  1. Hypermetabolic right upper lobe pulmonary mass consistent  bronchogenic carcinoma. Hypermetabolic nodule superior to the mass  consistent with local extension of tumor.  2. No hypermetabolic mediastinal lymph nodes  3. Small focus of metabolic activity in the left lower lobe is  likely inflammatory. Recommend attention on follow-up.  4. Small pulmonology nodule in the left lower lobe measuring 5 mm  does not have metabolic activity. Recommend attention on follow-up.  5. Pleural-parenchymal thickening in the right upper lobe is not  hypermetabolic and likely post inflammatory or infectious.  Electronically Signed  By: Genevive Bi M.D.  On: 05/13/2013 12:36   05/25/13- 63 yoF smoker referred courtesy of Dr Oneta Rack; LUL mass/ Squamous Cell Ca Needle bx POS Squamous Cell Ca Lung. To be discussed at [facility] in 3 days, and scheduled for MTOC. She tolerated needle biopsy very well, without complication. Little change in cough, no chest pain, blood or swollen glands. Blames "nerves" for insomnia, which began before prednisone. Prednisone is now finished. Xanax did not help.  Daughter here. ROS-see HPI Constitutional:   No-   weight loss, night sweats, fevers, chills, fatigue, lassitude. HEENT:   + headaches,no- difficulty swallowing, tooth/dental problems, sore throat,       No-  sneezing, itching, ear ache, nasal congestion, post nasal drip,  CV:  No-   chest pain, orthopnea, PND, swelling in lower extremities, anasarca,  dizziness, palpitations Resp: +shortness of breath with exertion or at rest.              +  productive cough,  No non-productive cough,  No- coughing up of blood.  No-   change in color of mucus.  No- wheezing.   Skin: No-   rash or lesions. GI:  No-   heartburn, indigestion, abdominal pain, nausea, vomiting,  GU:  MS:  No-   joint pain or swelling.   Neuro-     nothing unusual Psych:  No- change in mood or affect. +  depression or anxiety.  No memory loss.  OBJ- Physical Exam General- Alert, Oriented, Affect-appropriate, Distress- none acute, trying to be cheerful Skin- rash-none, lesions- none, excoriation- none Lymphadenopathy- none Head- +surgical defect right temple            Eyes- Gross vision intact, PERRLA, conjunctivae and secretions clear            Ears- +Deaf R, +hearing aid  L            Nose- Clear, no-Septal dev, mucus, polyps, erosion, perforation             Throat- Mallampati II , mucosa clear , drainage- none, tonsils- atrophic Neck- flexible , trachea midline, no stridor , thyroid nl, carotid no bruit Chest - symmetrical excursion , unlabored           Heart/CV- RRR , no murmur , no gallop  , no rub, nl s1 s2                           - JVD- none , edema- none, stasis changes- none, varices- none           Lung- +distant but clear, cough- none , dullness-none, rub- none           Chest wall-  Abd-  Br/ Gen/ Rectal- Not done, not indicated Extrem- cyanosis- none, clubbing, none, atrophy- none, strength- nl Neuro- grossly intact to observation

## 2013-05-25 NOTE — Assessment & Plan Note (Signed)
We discussed sleep hygiene and appropriate caution using sedating medications at her age. Plan-try temazepam instead of Xanax

## 2013-05-25 NOTE — Assessment & Plan Note (Signed)
Hailey Keith talk with patient and daughter. No metastasis seen. She will need identification of tumor markers and consideration of treatment options. I will present her at oncology conference and start therapeutic referral.

## 2013-05-25 NOTE — Assessment & Plan Note (Signed)
She has at least mild to moderate obstructive airways disease.  There is additional scarring which may contribute to a restrictive profile on spirometry, but I expect true measured lung volumes show air trapping. She improved with prednisone and bronchodilators given ahead of her needle biopsy to reduce risk of pneumothorax

## 2013-05-27 ENCOUNTER — Other Ambulatory Visit (HOSPITAL_BASED_OUTPATIENT_CLINIC_OR_DEPARTMENT_OTHER): Payer: Medicare Other | Admitting: Lab

## 2013-05-27 ENCOUNTER — Encounter: Payer: Self-pay | Admitting: Internal Medicine

## 2013-05-27 ENCOUNTER — Ambulatory Visit (HOSPITAL_BASED_OUTPATIENT_CLINIC_OR_DEPARTMENT_OTHER): Payer: Medicare Other | Admitting: Internal Medicine

## 2013-05-27 ENCOUNTER — Ambulatory Visit (HOSPITAL_BASED_OUTPATIENT_CLINIC_OR_DEPARTMENT_OTHER): Payer: Medicare Other

## 2013-05-27 VITALS — BP 157/76 | HR 73 | Temp 97.3°F | Resp 17 | Ht 64.0 in | Wt 161.2 lb

## 2013-05-27 DIAGNOSIS — C3490 Malignant neoplasm of unspecified part of unspecified bronchus or lung: Secondary | ICD-10-CM

## 2013-05-27 LAB — COMPREHENSIVE METABOLIC PANEL (CC13)
ALT: 12 U/L (ref 0–55)
AST: 17 U/L (ref 5–34)
CO2: 30 mEq/L — ABNORMAL HIGH (ref 22–29)
Chloride: 102 mEq/L (ref 98–109)
Sodium: 140 mEq/L (ref 136–145)
Total Bilirubin: 0.55 mg/dL (ref 0.20–1.20)
Total Protein: 6.5 g/dL (ref 6.4–8.3)

## 2013-05-27 LAB — CBC WITH DIFFERENTIAL/PLATELET
BASO%: 0.8 % (ref 0.0–2.0)
EOS%: 1.2 % (ref 0.0–7.0)
LYMPH%: 15.4 % (ref 14.0–49.7)
MCH: 30.7 pg (ref 25.1–34.0)
MCHC: 33.2 g/dL (ref 31.5–36.0)
MONO#: 1.3 10*3/uL — ABNORMAL HIGH (ref 0.1–0.9)
RBC: 4.4 10*6/uL (ref 3.70–5.45)
WBC: 12.9 10*3/uL — ABNORMAL HIGH (ref 3.9–10.3)
lymph#: 2 10*3/uL (ref 0.9–3.3)

## 2013-05-27 NOTE — Patient Instructions (Signed)
I will discuss her case at the weekly thoracic conference for the best option of her treatment including surgery versus radiation.

## 2013-05-27 NOTE — Progress Notes (Signed)
Checked in new patient with no financial issues. email for daughter--mail and ph

## 2013-05-27 NOTE — Progress Notes (Signed)
[facility] [phone]   [phone]  CONSULT NOTE  REFERRING PHYSICIAN: Dr. Jetty Duhamel.  REASON FOR CONSULTATION:  78 years old white female recently diagnosed with lung cancer.  HPI Hailey Keith is a 78 y.o. female was past medical history significant for COPD, hypertension, dyslipidemia, diet controlled diabetes mellitus as well as history of TIA and hearing deficit. The patient also has a long history of smoking. The patient mentions that she saw her primary care physician Dr. Oneta Rack on 04/23/2013 for routine physical examination. She was complaining of cough productive of clear sputum and chest x-ray was performed on 04/27/2013 and it showed findings suspicious for a left upper lobe lung mass. This was followed by CT scan of the chest on 04/29/2013 and it showed spiculated lesion in the left upper lobe measured 2.5 x 2.6 x 3.0 CMP the superior aspect of the lesion extends towards the periphery and this may represent a small satellite lesions or lymphangitic spread. The findings were suspicious for primary bronchogenic neoplasm. There were a few prominent but none specific mediastinal lymph nodes. The patient was referred to Dr. Maple Hudson and he ordered a PET scan which was performed 05/13/2013 and showed hypermetabolic left upper lobe pulmonary mass consistent with bronchogenic carcinoma. There is hypermetabolic nodule superior to the mass consistent with local extension of the tumor and no hypermetabolic mediastinal lymph nodes. There was also small focus of metabolic activity in the left lower lobe likely inflammatory. There is also small pulmonary nodule in the left lower lobe measuring 5 mm and does not have metabolic activity.  On 05/22/2013 the patient underwent CT-guided left upper lobe nodule core biopsy by interventional radiology. The final pathology (Accession: [id number]) showed squamous cell carcinoma. The patient is currently on home oxygen  and her office spirometry on 05/14/2013 showed Moderate restrictive disease based on exhaled volume. FVC 1.58/57%, FEV1 1.41/68%, FEV1/FVC 0.89. She was referred to me today for further evaluation and recommendation regarding treatment of her condition. The patient is feeling fine today except for fatigue and shortness breath at baseline and increased with exertion and she is currently on home oxygen all the time. She also has cough productive of thick sputum but no hemoptysis. The patient denied having any significant weight loss or night sweats. She denied having any visual changes but has occasional headache. Her family history significant for a sister with lung cancer, mother died from pneumonia and father from heart attack. The patient is divorced and has a daughter, Hailey Keith accompanied her to the office today. She used to work at [employer]. The patient has a history of smoking one pack per day for over 60 years and unfortunately she continues to smoke less than half a pack per day. I strongly encouraged her to quit smoking and offered her smoke cessation program. The patient also has 2 alcoholic drinks every night and no history of drug abuse.  @SFHPI @  Past Medical History  Diagnosis Date  . COPD (chronic obstructive pulmonary disease)   . Hypertension   . Dyslipidemia   . Diabetes mellitus     Diet controlled   . Hard of hearing     History reviewed. No pertinent past surgical history.  Family History  Problem Relation Age of Onset  . Hypertension    . Cancer      Social History History  Substance Use Topics  . Smoking status: Current Some Day Smoker -- 0.30 packs/day for 60 years    Types:  Cigarettes  . Smokeless tobacco: Not on file  . Alcohol Use: Yes     Comment: occ wine    No Known Allergies  Current Outpatient Prescriptions  Medication Sig Dispense Refill  . ALPRAZolam (XANAX) 0.5 MG tablet Take 0.5 mg by mouth 3 (three) times daily as needed. For anxiety       . aspirin 81 MG tablet Take 81 mg by mouth daily.      Marland Kitchen atorvastatin (LIPITOR) 80 MG tablet Take 80 mg by mouth. On Sunday and Wednesday      . citalopram (CELEXA) 40 MG tablet Take 40 mg by mouth every morning.       Marland Kitchen doxazosin (CARDURA) 8 MG tablet Take 8 mg by mouth at bedtime.      . Fluticasone-Salmeterol (ADVAIR) 500-50 MCG/DOSE AEPB Inhale 1 puff into the lungs every 12 (twelve) hours.      Marland Kitchen guaifenesin (HUMIBID E) 400 MG TABS tablet Take 400 mg by mouth 2 (two) times daily.      Marland Kitchen losartan (COZAAR) 100 MG tablet Take 100 mg by mouth at bedtime.       . temazepam (RESTORIL) 15 MG capsule 1 or 2 for sleep if needed  30 capsule  2  . tiotropium (SPIRIVA) 18 MCG inhalation capsule Place 18 mcg into inhaler and inhale daily.      . Vitamin D, Ergocalciferol, (DRISDOL) 50000 UNITS CAPS capsule Take 50,000 Units by mouth every Wednesday.        No current facility-administered medications for this visit.    Review of Systems  Constitutional: positive for fatigue Eyes: negative Ears, nose, mouth, throat, and face: negative Respiratory: positive for cough and dyspnea on exertion Cardiovascular: negative Gastrointestinal: negative Genitourinary:negative Integument/breast: negative Hematologic/lymphatic: negative Musculoskeletal:negative Neurological: negative Behavioral/Psych: negative Endocrine: negative Allergic/Immunologic: negative  Physical Exam  JXB:JYNWG, healthy, no distress, well nourished and well developed SKIN: skin color, texture, turgor are normal, no rashes or significant lesions HEAD: Normocephalic, No masses, lesions, tenderness or abnormalities EYES: normal, PERRLA EARS: External ears normal, Canals clear OROPHARYNX:no exudate and no erythema  NECK: supple, no adenopathy, no JVD LYMPH:  no palpable lymphadenopathy, no hepatosplenomegaly BREAST:not examined LUNGS: clear to auscultation , and palpation HEART: regular rate & rhythm, no murmurs and no  gallops ABDOMEN:abdomen soft, non-tender, normal bowel sounds and no masses or organomegaly BACK: Back symmetric, no curvature. EXTREMITIES:no joint deformities, effusion, or inflammation, no edema, no skin discoloration  NEURO: alert & oriented x 3 with fluent speech, no focal motor/sensory deficits  PERFORMANCE STATUS: ECOG 1-2  LABORATORY DATA: Lab Results  Component Value Date   WBC 12.9* 05/27/2013   HGB 13.5 05/27/2013   HCT 40.7 05/27/2013   MCV 92.4 05/27/2013   PLT 206 05/27/2013      Chemistry      Component Value Date/Time   NA 140 05/27/2013 1423   NA 138 05/14/2013 1717   K 4.0 05/27/2013 1423   K 4.3 05/14/2013 1717   CL 100 05/14/2013 1717   CO2 30* 05/27/2013 1423   CO2 32 05/14/2013 1717   BUN 18.3 05/27/2013 1423   BUN 14 05/14/2013 1717   CREATININE 0.8 05/27/2013 1423   CREATININE 0.7 05/14/2013 1717      Component Value Date/Time   CALCIUM 9.7 05/27/2013 1423   CALCIUM 9.9 05/14/2013 1717   ALKPHOS 81 05/27/2013 1423   ALKPHOS 74 05/14/2013 1717   AST 17 05/27/2013 1423   AST 26 05/14/2013 1717   ALT 12  05/27/2013 1423   ALT 16 05/14/2013 1717   BILITOT 0.55 05/27/2013 1423   BILITOT 0.5 05/14/2013 1717       RADIOGRAPHIC STUDIES: Dg Chest 1 View  05/20/2013   *RADIOLOGY REPORT*  Clinical Data: Post biopsy of left upper lobe hypermetabolic pulmonary nodule, subsequent encounter  CHEST - 1 VIEW  Comparison: 04/27/2013; chest CT - 04/29/2013; CT guided left upper lobe pulmonary mass biopsy - earlier same day  Findings:  Grossly unchanged cardiac silhouette and mediastinal contours with atherosclerotic plaque within a mildly tortuous thoracic aorta. Grossly unchanged appearance of known ill-defined heterogeneous nodule within the left upper lobe.  No evidence of pneumothorax or significant peri biopsy hemorrhage following CT guided biopsy.  The lungs remain hyperexpanded with diffuse nodular thickening of the interstitium.  Improved aeration of the lung bases with persistent  right infrahilar heterogeneous opacities.  No pleural effusion or evidence of edema.  Unchanged bones.  IMPRESSION: No evidence of complication following left upper lobe pulmonary mass biopsy.   Original Report Authenticated By: Tacey Ruiz, MD   Ct Chest W Contrast  04/29/2013   *RADIOLOGY REPORT*  Clinical Data: Evaluate for a lung lesion.  CT CHEST WITH CONTRAST  Technique:  Multidetector CT imaging of the chest was performed following the standard protocol during bolus administration of intravenous contrast.  Contrast: 75mL OMNIPAQUE IOHEXOL 300 MG/ML  SOLN  Comparison: Chest radiograph 04/27/2013  Findings: There are a few small mediastinal lymph nodes.  Lymph node in the AP window measures 9 mm in short axis on sequence #3, image number 27.  Precarinal lymph node measures 1.3 cm in short axis on image 29. There is also a prominent lymph node just posterior to the esophagus on image 33 that is measures 0.8 cm in short axis.  No significant pleural effusions.  There are coronary artery calcifications and calcifications of the mitral annulus. There is a trace amount of pericardial fluid.  Images of the upper abdomen demonstrate a small hiatal hernia.  There are a few small lymph nodes in the upper abdomen that are nonspecific. Normal appearance of the adrenal tissue.  No significant axillary or supraclavicular lymphadenopathy.  The trachea and mainstem bronchi are patent.  The patient has centrilobular emphysema.  There is a spiculated lesion in the left upper lobe that measures 3.5 x 2.6 x 3.0 cm.  The superior aspect of the lesion extends towards the periphery and this may represent small satellite lesions or lymphangitic spread.  Questionable small lesion or nodule in the left lower lobe on sequence #4, image 39. In general, there are scattered punctate densities throughout both lungs which are nonspecific but could represent areas of scarring or postinflammatory change.  Previous chest radiographs  demonstrated previous inflammation, particularly in the right lung. There is some interstitial thickening and bronchiectasis in the right upper lobe which may represent postinflammatory changes. There is a focal area of consolidation in the right middle lobe.  Bones demonstrate deformity of the right ribs suggesting old injuries.  No suspicious bone lesions.  IMPRESSION: There is a spiculated mass in the left upper lobe. The lesion has finger-like projections towards the periphery which could represent small satellite lesions or lymphangitic involvement.  Findings are suspicious for a primary bronchogenic neoplasm.  There are a few prominent but nonspecific mediastinal lymph nodes. Recommend a PET- CT for further characterization of the left upper lung mass and mediastinal lymph nodes.  Centrilobular emphysema.  Scattered interstitial thickening with areas of nodularity in the  lungs, particularly the right side. This most likely represents postinflammatory change.  There is also a small amount of focal consolidation in the right middle lobe.  Extensive atherosclerotic disease, including the coronary arteries.  These results will be called to the ordering clinician or representative by the Radiologist Assistant, and communication documented in the PACS Dashboard.   Original Report Authenticated By: Richarda Overlie, M.D.   Nm Pet Image Initial (pi) Skull Base To Thigh  05/27/2013   ADDENDUM REPORT: 05/27/2013 17:06  ADDENDUM: Initial report by Dr. Amil Amen ADDENDUM by Dr. Reche Dixon. The lung mass is within the left upper lobe.   Electronically Signed   By: Jeronimo Greaves   On: 05/27/2013 17:06   05/27/2013   CLINICAL DATA:  Initial treatment strategy for lung mass a.  EXAM: NUCLEAR MEDICINE PET SKULL BASE TO THIGH  FASTING BLOOD GLUCOSE:  Value:  106 axial mg/dl  TECHNIQUE: 40.9 mCi W-11 FDG was injected intravenously. CT data was obtained and used for attenuation correction and anatomic localization only. (This was not  acquired as a diagnostic CT examination.) Additional exam technical data entered on technologist worksheet.  COMPARISON:  CT 04/29/2013 .  FINDINGS: NECK  No hypermetabolic lymph nodes in the neck.  CHEST  Within the right upper lobe there is a hypermetabolic 3.2 x 2.2 cm mass with SUV max = max 25.1. There are several small nodules peripheral and superior to the mass (image 74). No hypermetabolic mediastinal lymph nodes. There is a nodule in the left lower lobe measuring 5 mm (image 102) without clear associated metabolic activity. There is a small focus cavitary interstitial thickening (image 98) in the left lower lobe which has mild metabolic activity. Nodular pleural parenchymal thickening in the right upper (image 103) does not have significant metabolic activity.  ABDOMEN/PELVIS  No abnormal hypermetabolic activity within the liver, pancreas, adrenal glands, or spleen. No hypermetabolic lymph nodes in the abdomen or pelvis.  SKELETON  No focal hypermetabolic activity to suggest skeletal metastasis.  IMPRESSION: 1. Hypermetabolic right upper lobe pulmonary mass consistent bronchogenic carcinoma. Hypermetabolic nodule superior to the mass consistent with local extension of tumor. 2. No hypermetabolic mediastinal lymph nodes 3. Small focus of metabolic activity in the left lower lobe is likely inflammatory. Recommend attention on follow-up. 4. Small pulmonology nodule in the left lower lobe measuring 5 mm does not have metabolic activity. Recommend attention on follow-up. 5. Pleural-parenchymal thickening in the right upper lobe is not hypermetabolic and likely post inflammatory or infectious.  Electronically Signed: By: Genevive Bi M.D. On: 05/13/2013 12:36   Ct Biopsy  05/20/2013   *RADIOLOGY REPORT*  Indication: Left upper lobe pulmonary mass worrisome for bronchogenic carcinoma, initial encounter  CT GUIDED LEFT UPPER LOBE NODULE CORE NEEDLE BIOPSY  Comparisons: PET CT - 05/12/2013; chest CT -  04/29/2013  Intravenous medications: Fentanyl 100 mcg IV; Versed 2 mg IV  Contrast: None  Sedation time: 26 minutes  Complications: None immediate  TECHNIQUE/FINDINGS:  Informed consent was obtained from the patient following an explanation of the procedure, risks, benefits and alternatives. The patient understands, agrees and consents for the procedure. All questions were addressed.  A time out was performed prior to the initiation of the procedure.  The patient was positioned supine on the CT table and a limited chest CT was performed for procedural planning demonstrating grossly unchanged appearance of approximately 3.4 x 2.0 cm slightly spiculated nodule within the left upper lobe (image four, series 3).  The operative site was prepped  and draped in the usual sterile fashion.  Under sterile conditions and local anesthesia, a 22 gauge spinal needle was utilized for trajectory planning.  Ultimately, a 17 gauge coaxial needle was advanced into the peripheral aspect of the nodule.  Positioning was confirmed with intermittent CT fluoroscopy and followed by the acquisition of 3 core needle biopsies with an 18 gauge core needle biopsy device.  Limited post procedural chest CT demonstrated a minimal amount of asymptomatic peribiopsy hemorrhage.  The amount of hemorrhage remained stable on an acquired limited CT scan obtained approximately 10 minutes following the procedural completion.  No pneumothorax.  The co-axial needle was removed and hemostasis was achieved with manual compression.  A dressing was placed.  The patient tolerated the procedure well without immediate postprocedural complication.  The patient was escorted to have an upright chest radiograph.  IMPRESSION:  Technically successful CT guided core needle biopsy of hypermetabolic left upper lobe pulmonary nodule.   Original Report Authenticated By: Tacey Ruiz, MD    ASSESSMENT: This is a very pleasant 78 years old white female recently diagnosed with  stage IB/IIB (T2a/T3, N0, MX) non-small cell lung cancer consistent with squamous cell carcinoma presented with left upper lobe lung mass with questionable satellite nodule. Her pulmonary function are borderline and I'm not sure if the patient would be a surgical candidate for resection.  PLAN: I have a lengthy discussion with the patient and her daughter today about her current disease stage, prognosis and treatment options. I will discuss her case at the weekly thoracic conference for consideration of the best option of her treatment including surgical resection versus stereotactic radiotherapy versus conventional concurrent chemoradiation. I will call the patient early next week with the final decision regarding the best approach to her treatment. I did not give her a followup appointment at this point I recommend for the final decision of her treatment. The patient may need CT scan of the head to rule out brain metastasis before proceeding with any surgical resection. She was advised to call me immediately if she has any concerning symptoms in the interval. The patient voices understanding of current disease status and treatment options and is in agreement with the current care plan.  All questions were answered. The patient knows to call the clinic with any problems, questions or concerns. We can certainly see the patient much sooner if necessary.  Thank you so much for allowing me to participate in the care of Hailey Keith. I will continue to follow up the patient with you and assist in her care.  I spent 40 minutes counseling the patient face to face. The total time spent in the appointment was 60 minutes.  Bronsen Serano K. 05/27/2013, 5:29 PM

## 2013-05-28 ENCOUNTER — Ambulatory Visit: Payer: Medicare Other | Admitting: Internal Medicine

## 2013-06-01 ENCOUNTER — Telehealth: Payer: Self-pay | Admitting: *Deleted

## 2013-06-01 NOTE — Telephone Encounter (Signed)
Called and spoke with daughter regarding follow up from thoracic conference.  I informed her that Rad Onc will be calling with an appt

## 2013-06-03 ENCOUNTER — Encounter: Payer: Self-pay | Admitting: Radiation Oncology

## 2013-06-03 NOTE — Progress Notes (Signed)
Thoracic Location of Tumor / Histology: Left Upper Lung Squamous Cell Carcinoma  Patient presented: She was complaining of cough productive of clear sputum and chest x-ray was performed on 04/27/2013 and it showed findings suspicious for a left upper lobe lung mass. This was followed by CT scan of the chest on 04/29/2013 and it showed spiculated lesion in the left upper lobe measured 2.5 x 2.6 x 3.0 CMP the superior aspect of the lesion extends towards the periphery and this may represent a small satellite lesions or lymphangitic spread. The findings were suspicious for primary bronchogenic neoplasm. There were a few prominent but none specific mediastinal lymph nodes. The patient was referred to Dr. Maple Hudson and he ordered a PET scan which was performed 05/13/2013 and showed hypermetabolic left upper lobe pulmonary mass consistent with bronchogenic carcinoma. There is hypermetabolic nodule superior to the mass consistent with local extension of the tumor and no hypermetabolic mediastinal lymph nodes. There was also small focus of metabolic activity in the left lower lobe likely inflammatory. There is also small pulmonary nodule in the left lower lobe measuring 5 mm and does not have metabolic activity.   05/20/13 Diagnosis Lung, needle/core biopsy(ies), LUL - SQUAMOUS CELL CARCINOMA Stage IB/IIB (T2a/T3, N0, MX) non-small cell lung cancer consistent with squamous cell carcinoma presented with left upper lobe lung mass with questionable satellite nodule  Tobacco/Marijuana/Snuff/ETOH use: Smoking one PPD for over 60 years. She continues to smoke half a pack per day, states she is trying to quit.  Offered smoking cessation classes.  2 alcoholic drinks every night, no history of drug abuse.   Past/Anticipated interventions by cardiothoracic surgery, if any: Biopsy  Past/Anticipated interventions by medical oncology, if any: Per Dr Asa Lente note on 05/27/13, pt to be discussed at [facility] for best tx  option.  Signs/Symptoms  Weight changes, if any: Denies weight loss Respiratory complaints, if any: Shortness breath at baseline and increased with exertion. She uses O2 @ 3-4 L/min nightly and prn during the day. She has cough productive of thick clear sputum but no hemoptysis.  Hemoptysis, if any: None  Pain issues, if any:  none  SAFETY ISSUES:  Prior radiation? No  Pacemaker/ICD? No  Possible current pregnancy? No  Is the patient on methotrexate? no  Current Complaints / other details:  Divorced, retired from [employer], daughter w/pt. Pt denies pain, loss of appetite, fatigue.

## 2013-06-04 ENCOUNTER — Ambulatory Visit: Payer: Medicare Other | Admitting: Radiation Oncology

## 2013-06-04 ENCOUNTER — Ambulatory Visit: Payer: Medicare Other

## 2013-06-05 ENCOUNTER — Ambulatory Visit
Admission: RE | Admit: 2013-06-05 | Discharge: 2013-06-05 | Disposition: A | Discharge status: Home / Self Care | Payer: Medicare Other | Source: Ambulatory Visit | Attending: Radiation Oncology | Admitting: Radiation Oncology

## 2013-06-05 ENCOUNTER — Encounter: Payer: Self-pay | Admitting: Radiation Oncology

## 2013-06-05 DIAGNOSIS — F17200 Nicotine dependence, unspecified, uncomplicated: Secondary | ICD-10-CM | POA: Insufficient documentation

## 2013-06-05 DIAGNOSIS — B370 Candidal stomatitis: Secondary | ICD-10-CM

## 2013-06-05 DIAGNOSIS — Z79899 Other long term (current) drug therapy: Secondary | ICD-10-CM | POA: Insufficient documentation

## 2013-06-05 DIAGNOSIS — C3492 Malignant neoplasm of unspecified part of left bronchus or lung: Secondary | ICD-10-CM

## 2013-06-05 DIAGNOSIS — E785 Hyperlipidemia, unspecified: Secondary | ICD-10-CM | POA: Insufficient documentation

## 2013-06-05 DIAGNOSIS — J4489 Other specified chronic obstructive pulmonary disease: Secondary | ICD-10-CM | POA: Insufficient documentation

## 2013-06-05 DIAGNOSIS — E119 Type 2 diabetes mellitus without complications: Secondary | ICD-10-CM | POA: Insufficient documentation

## 2013-06-05 DIAGNOSIS — C3410 Malignant neoplasm of upper lobe, unspecified bronchus or lung: Secondary | ICD-10-CM | POA: Insufficient documentation

## 2013-06-05 DIAGNOSIS — I10 Essential (primary) hypertension: Secondary | ICD-10-CM | POA: Insufficient documentation

## 2013-06-05 DIAGNOSIS — J449 Chronic obstructive pulmonary disease, unspecified: Secondary | ICD-10-CM | POA: Insufficient documentation

## 2013-06-05 HISTORY — DX: Unspecified malignant neoplasm of skin, unspecified: C44.90

## 2013-06-05 HISTORY — DX: Malignant neoplasm of unspecified part of unspecified bronchus or lung: C34.90

## 2013-06-05 MED ORDER — NYSTATIN 100000 UNIT/ML MT SUSP: 500000.0000 [IU] | Freq: Four times a day (QID) | OROMUCOSAL | Status: DC

## 2013-06-05 NOTE — Progress Notes (Signed)
Please see the Nurse Progress Note in the MD Initial Consult Encounter for this patient. 

## 2013-06-05 NOTE — Progress Notes (Addendum)
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 female who presented with productive cough, prompting a chest x-ray on August 25 which showed a suspicious findings in the left upper lung. A CT scan of the chest 20 2714 showed a spiculated lesion in left upper lobe and measured 2.0 cm with suspicion of small satellite lesions or lymphangitic spread adjacent to the main mass. A PET scan was performed on 05/13/2013 which showed a hypermetabolic left upper lobe lung mass with adjacent hypermetabolic nodules but no hypermetabolic mediastinal lymph nodes. She has at least 2 other pulmonary nodules in the left lung that are less suspicious but will need to be followed. The patient denies unintentional weight loss. She does have shortness of breath at baseline and increased with exertion. She uses oxygen at 3-4 L per minute nightly and as needed during the day. She has a productive cough with clear thick sputum but no hemoptysis. She has a history of smoking a pack per day for many years and continues to smoke about half a pack per day. She's been offered smoking cessation classes.  PREVIOUS RADIATION THERAPY: No  PAST MEDICAL HISTORY:  has a past medical history of COPD (chronic obstructive pulmonary disease); Hypertension; Dyslipidemia; Diabetes mellitus; Hard of hearing; Lung cancer (05/20/13); Squamous cell carcinoma lung (05/20/13); and Skin cancer.    PAST SURGICAL HISTORY: Past Surgical History  Procedure Laterality Date  . Tonsillectomy      as child, repeat surgery early 20's  . Mohs surgery      "several times"  . Facial  reconstruction surgery  25 years ago    to remove skin cancer, right side of face    FAMILY HISTORY: family history includes Cancer in her sister; Heart attack in her father; Hypertension in an other family member; Lung cancer in her sister; Pneumonia in her mother.  SOCIAL HISTORY:  reports that she has been smoking Cigarettes.  She has a 18 pack-year smoking history. She does not have any smokeless tobacco history on file. She reports that  drinks alcohol. She reports that she does not use illicit drugs.  ALLERGIES: Review of patient's allergies indicates no known allergies.  MEDICATIONS:  Current Outpatient Prescriptions  Medication Sig Dispense Refill  . ALPRAZolam (XANAX) 0.5 MG tablet Take 0.5 mg by mouth 3 (three) times daily as needed. For anxiety      . aspirin 81 MG tablet Take 81 mg by mouth daily.      Marland Kitchen atorvastatin (LIPITOR) 80 MG tablet Take 80 mg by mouth. On Sunday and Wednesday      . citalopram (CELEXA) 40 MG tablet Take 40 mg by mouth every morning.       Marland Kitchen doxazosin (CARDURA) 8 MG tablet Take 8 mg by mouth at bedtime.      . Fluticasone-Salmeterol (ADVAIR) 500-50 MCG/DOSE AEPB Inhale 1 puff into the lungs every 12 (twelve) hours.      Marland Kitchen guaifenesin (HUMIBID E) 400 MG TABS tablet Take 400 mg by mouth 2 (two) times daily.      Marland Kitchen losartan (COZAAR) 100 MG tablet Take 100 mg by mouth at bedtime.       Marland Kitchen  temazepam (RESTORIL) 15 MG capsule 1 or 2 for sleep if needed  30 capsule  2  . tiotropium (SPIRIVA) 18 MCG inhalation capsule Place 18 mcg into inhaler and inhale daily.      . Vitamin D, Ergocalciferol, (DRISDOL) 50000 UNITS CAPS capsule Take 50,000 Units by mouth every Wednesday.       . nystatin (MYCOSTATIN) 100000 UNIT/ML suspension Take 5 mLs (500,000 Units total) by mouth 4 (four) times daily. Swish and gargle for approx 1 minute, then swallow. Take for approx 9 days.  180 mL  0   No current facility-administered medications for this encounter.    REVIEW OF SYSTEMS:   Notable for that above.   PHYSICAL EXAM:  height is 5\' 4"  (1.626 m) and weight is 159 lb 11.2 oz (72.439 kg). Her oral temperature is 98 F (36.7 C). Her blood pressure is 128/73 and her pulse is 78. Her respiration is 20 and oxygen saturation is 92%.  <-- on RA General: Alert and oriented, in no acute distress HEENT: Head is normocephalic. Pupils are equally round and reactive to light. Extraocular movements are intact. Oropharynx is notable for punctate areas of thrush Neck: Neck is supple, no palpable cervical or supraclavicular lymphadenopathy. Heart: Regular in rate and rhythm with no murmurs, rubs, or gallops. Chest: low pitched sounds bilaterally on inspiration and expiration. Abdomen: Soft, nontender, nondistended, with no rigidity or guarding. Extremities: venous stasis changes - erythematous skin in ankles/ feet. No edema. Lymphatics: No concerning lymphadenopathy. Skin: as above Musculoskeletal: symmetric strength and muscle tone throughout. Neurologic: Cranial nerves II through XII are grossly intact. No obvious focalities. Speech is fluent. Coordination is intact. Psychiatric: Judgment and insight are intact. Affect is appropriate.    ECOG = 1  LABORATORY DATA:  Lab Results  Component Value Date   WBC 12.9* 05/27/2013   HGB 13.5 05/27/2013   HCT 40.7 05/27/2013   MCV 92.4 05/27/2013   PLT 206 05/27/2013   CMP     Component Value Date/Time   NA 140 05/27/2013 1423   NA 138 05/14/2013 1717   K 4.0 05/27/2013 1423   K 4.3 05/14/2013 1717   CL 100 05/14/2013 1717   CO2 30* 05/27/2013 1423   CO2 32 05/14/2013 1717   GLUCOSE 125 05/27/2013 1423   GLUCOSE 105* 05/14/2013 1717   BUN 18.3 05/27/2013 1423   BUN 14 05/14/2013 1717   CREATININE 0.8 05/27/2013 1423   CREATININE 0.7 05/14/2013 1717   CALCIUM 9.7 05/27/2013 1423   CALCIUM 9.9 05/14/2013 1717   PROT 6.5 05/27/2013 1423   PROT 7.0 05/14/2013 1717   ALBUMIN 3.6 05/27/2013 1423   ALBUMIN 4.2 05/14/2013 1717   AST 17 05/27/2013  1423   AST 26 05/14/2013 1717   ALT 12 05/27/2013 1423   ALT 16 05/14/2013 1717   ALKPHOS 81 05/27/2013 1423   ALKPHOS 74 05/14/2013 1717   BILITOT 0.55 05/27/2013 1423   BILITOT 0.5 05/14/2013 1717   GFRNONAA >90 12/27/2011 0640   GFRAA >90 12/27/2011 0640         RADIOGRAPHY: Dg Chest 1 View  05/20/2013   *RADIOLOGY REPORT*  Clinical Data: Post biopsy of left upper lobe hypermetabolic pulmonary nodule, subsequent encounter  CHEST - 1 VIEW  Comparison: 04/27/2013; chest CT - 04/29/2013; CT guided left upper lobe pulmonary mass biopsy - earlier same day  Findings:  Grossly unchanged cardiac silhouette and mediastinal contours with atherosclerotic plaque within a mildly tortuous thoracic aorta. Grossly unchanged appearance of  known ill-defined heterogeneous nodule within the left upper lobe.  No evidence of pneumothorax or significant peri biopsy hemorrhage following CT guided biopsy.  The lungs remain hyperexpanded with diffuse nodular thickening of the interstitium.  Improved aeration of the lung bases with persistent right infrahilar heterogeneous opacities.  No pleural effusion or evidence of edema.  Unchanged bones.  IMPRESSION: No evidence of complication following left upper lobe pulmonary mass biopsy.   Original Report Authenticated By: Tacey Ruiz, MD   Nm Pet Image Initial (pi) Skull Base To Thigh  05/27/2013   ADDENDUM REPORT: 05/27/2013 17:06  ADDENDUM: Initial report by Dr. Amil Amen ADDENDUM by Dr. Reche Dixon. The lung mass is within the left upper lobe.   Electronically Signed   By: Jeronimo Greaves   On: 05/27/2013 17:06   05/27/2013   CLINICAL DATA:  Initial treatment strategy for lung mass a.  EXAM: NUCLEAR MEDICINE PET SKULL BASE TO THIGH  FASTING BLOOD GLUCOSE:  Value:  106 axial mg/dl  TECHNIQUE: 96.0 mCi A-54 FDG was injected intravenously. CT data was obtained and used for attenuation correction and anatomic localization only. (This was not acquired as a diagnostic CT examination.) Additional  exam technical data entered on technologist worksheet.  COMPARISON:  CT 04/29/2013 .  FINDINGS: NECK  No hypermetabolic lymph nodes in the neck.  CHEST  Within the right upper lobe there is a hypermetabolic 3.2 x 2.2 cm mass with SUV max = max 25.1. There are several small nodules peripheral and superior to the mass (image 74). No hypermetabolic mediastinal lymph nodes. There is a nodule in the left lower lobe measuring 5 mm (image 102) without clear associated metabolic activity. There is a small focus cavitary interstitial thickening (image 98) in the left lower lobe which has mild metabolic activity. Nodular pleural parenchymal thickening in the right upper (image 103) does not have significant metabolic activity.  ABDOMEN/PELVIS  No abnormal hypermetabolic activity within the liver, pancreas, adrenal glands, or spleen. No hypermetabolic lymph nodes in the abdomen or pelvis.  SKELETON  No focal hypermetabolic activity to suggest skeletal metastasis.  IMPRESSION: 1. Hypermetabolic right upper lobe pulmonary mass consistent bronchogenic carcinoma. Hypermetabolic nodule superior to the mass consistent with local extension of tumor. 2. No hypermetabolic mediastinal lymph nodes 3. Small focus of metabolic activity in the left lower lobe is likely inflammatory. Recommend attention on follow-up. 4. Small pulmonology nodule in the left lower lobe measuring 5 mm does not have metabolic activity. Recommend attention on follow-up. 5. Pleural-parenchymal thickening in the right upper lobe is not hypermetabolic and likely post inflammatory or infectious.  Electronically Signed: By: Genevive Bi M.D. On: 05/13/2013 12:36   Ct Biopsy  05/20/2013   *RADIOLOGY REPORT*  Indication: Left upper lobe pulmonary mass worrisome for bronchogenic carcinoma, initial encounter  CT GUIDED LEFT UPPER LOBE NODULE CORE NEEDLE BIOPSY  Comparisons: PET CT - 05/12/2013; chest CT - 04/29/2013  Intravenous medications: Fentanyl 100 mcg IV;  Versed 2 mg IV  Contrast: None  Sedation time: 26 minutes  Complications: None immediate  TECHNIQUE/FINDINGS:  Informed consent was obtained from the patient following an explanation of the procedure, risks, benefits and alternatives. The patient understands, agrees and consents for the procedure. All questions were addressed.  A time out was performed prior to the initiation of the procedure.  The patient was positioned supine on the CT table and a limited chest CT was performed for procedural planning demonstrating grossly unchanged appearance of approximately 3.4 x 2.0 cm slightly spiculated  nodule within the left upper lobe (image four, series 3).  The operative site was prepped and draped in the usual sterile fashion.  Under sterile conditions and local anesthesia, a 22 gauge spinal needle was utilized for trajectory planning.  Ultimately, a 17 gauge coaxial needle was advanced into the peripheral aspect of the nodule.  Positioning was confirmed with intermittent CT fluoroscopy and followed by the acquisition of 3 core needle biopsies with an 18 gauge core needle biopsy device.  Limited post procedural chest CT demonstrated a minimal amount of asymptomatic peribiopsy hemorrhage.  The amount of hemorrhage remained stable on an acquired limited CT scan obtained approximately 10 minutes following the procedural completion.  No pneumothorax.  The co-axial needle was removed and hemostasis was achieved with manual compression.  A dressing was placed.  The patient tolerated the procedure well without immediate postprocedural complication.  The patient was escorted to have an upright chest radiograph.  IMPRESSION:  Technically successful CT guided core needle biopsy of hypermetabolic left upper lobe pulmonary nodule.   Original Report Authenticated By: Tacey Ruiz, MD   PFTs on 05-14-13: FEV1 68% predicted    IMPRESSION/PLAN:  This is a lovely 78 year old woman, here with her daughter today. The patient has T2a  versus T3, N0, M0 squamous cell carcinoma of the left upper lung. The main tumor has adjacent hypermetabolic nodules consistent with lymphangitic spread versus satellite nodules. The PET scan fortunately does not show any hypermetabolic activity in the regional lymph nodes or signs indicative of distant metastases. There are some less suspicious pulmonary nodules that warrant attention on followup.  I have not been able to speak directly with Dr. Shirline Frees today, but will try to touch base with him soon to establish a coordinate a treatment plan. The patient is scheduled for simulation to plan her radiation this coming Tuesday, October 7. While not necessarily inappropriate, I do not think she would be a conventional candidate for stereotactic body radiotherapy (SBRT) based on the cluster of hypermetabolic nodules adjacent to the main mass. I would favor a hypofractionated course of 70.2 Gray in 26 fractions to the patient's left upper lung disease, versus concurrent chemoradiation, in which she would receive 70 gray in 35 fractions. I need to speak with Dr. Shirline Frees to see if he feels urgency to start chemotherapy as soon as possible - or if  he would rather hold chemotherapy indefinitely, to be considered after completion of a hypofractionated course of radiation. I will also establish with him that she was not a good surgical candidate per [facility] discussion ( I am not aware of how that discussion flushed out).  I look forward to seeing the patient and her daughter back next week. They have a vacation planned through October 15, so the anticipated start date for radiotherapy will be October 16. Consent form has been signed and placed in the chart after a thorough discussion regarding the risks benefits and side effects of radiotherapy to the chest.  Nystatin Rx'd for thrush.  I spent 40 minutes  face to face with the patient and more than 50% of that time was spent in counseling and/or coordination of care.     __________________________________________   Lonie Peak, MD

## 2013-06-09 ENCOUNTER — Ambulatory Visit
Admission: RE | Admit: 2013-06-09 | Discharge: 2013-06-09 | Disposition: A | Discharge status: Home / Self Care | Payer: Medicare Other | Source: Ambulatory Visit | Attending: Radiation Oncology | Admitting: Radiation Oncology

## 2013-06-09 DIAGNOSIS — C3410 Malignant neoplasm of upper lobe, unspecified bronchus or lung: Secondary | ICD-10-CM | POA: Insufficient documentation

## 2013-06-09 DIAGNOSIS — Z510 Encounter for antineoplastic radiation therapy: Secondary | ICD-10-CM | POA: Insufficient documentation

## 2013-06-09 NOTE — Progress Notes (Signed)
  Radiation Oncology         [phone] ________________________________  Name: Hailey Keith MRN: [id number]  Date: 06/09/2013  DOB: Jul 29, 1935  SIMULATION AND TREATMENT PLANNING NOTE  Outpatient  DIAGNOSIS:  Left upper lung cancer  NARRATIVE:  The patient was brought to the CT Simulation planning suite.  Identity was confirmed.  All relevant records and images related to the planned course of therapy were reviewed.  The patient freely provided informed written consent to proceed with treatment after reviewing the details related to the planned course of therapy. The consent form was witnessed and verified by the simulation staff.    Then, the patient was set-up in a stable reproducible  supine position for radiation therapy.  CT images were obtained.  Surface markings were placed.  The CT images were loaded into the planning software.     RESPIRATORY MOTION MANAGEMENT SIMULATION  NARRATIVE:  In order to account for effect of respiratory motion on target structures and other organs in the planning and delivery of radiotherapy, this patient underwent respiratory motion management simulation.  To accomplish this, when the patient was brought to the CT simulation planning suite, 4D respiratoy motion management CT images were obtained.  The CT images were loaded into the planning software.  Then, using a variety of tools including Cine, MIP, and standard views, the target volume and planning target volumes (PTV) were delineated.  Avoidance structures were contoured.  Treatment planning then occurred.     TREATMENT PLANNING NOTE: Treatment planning then occurred.  The radiation prescription was entered and confirmed.    A total of 6 medically necessary complex treatment devices were fabricated and supervised by me - 6 beams with MLCs to block cord, esophagus, heart, lung. I have requested : 3D Simulation  I have requested a DVH of the following structures: cord, esophagus, heart, lung, ITV,  PTV.   The patient will receive 70.2 Gy in 26 fractions to her LUL tumors. No chemotherapy per my discussion with Dr. Arbutus Ped.   -----------------------------------  Lonie Peak, MD

## 2013-06-12 ENCOUNTER — Other Ambulatory Visit: Payer: Self-pay | Admitting: *Deleted

## 2013-06-12 NOTE — Addendum Note (Signed)
Encounter addended by: Imogean Ciampa Mintz Judy Pollman, RN on: 06/12/2013  6:41 PM<BR>     Documentation filed: Charges VN

## 2013-06-15 ENCOUNTER — Telehealth: Payer: Self-pay | Admitting: Internal Medicine

## 2013-06-15 NOTE — Telephone Encounter (Signed)
home # busy and cell is another person...mailed pt avs and letter

## 2013-06-18 ENCOUNTER — Encounter: Payer: Self-pay | Admitting: Radiation Oncology

## 2013-06-18 ENCOUNTER — Ambulatory Visit
Admission: RE | Admit: 2013-06-18 | Discharge: 2013-06-18 | Disposition: A | Discharge status: Home / Self Care | Payer: Medicare Other | Source: Ambulatory Visit | Attending: Radiation Oncology | Admitting: Radiation Oncology

## 2013-06-18 NOTE — Progress Notes (Signed)
[facility] Psychosocial Distress Screening Clinical Social Work  Clinical Social Work was referred by distress screening protocol.  The patient scored a 5 on the Psychosocial Distress Thermometer which indicates moderate distress. Clinical Social Worker Intern telephoned to assess for distress and other psychosocial needs.  Patient's daughter answered and stated they were currently in the lobby awaiting Patient's first treatment.  Patient's daughter stated Patient was doing well and everything "was great".  Clinical Social Worker Intern encouraged Patient's daughter to get in touch if she had questions or other.   Clinical Social Worker follow up needed: no  If yes, follow up plan:   Jakhai Fant S. [facility] Clinical Social Work Intern [employer] [facility] [phone]

## 2013-06-18 NOTE — Progress Notes (Signed)
Simulation verification note: The patient underwent simulation verification today prior to her treatment to her left lung. Her isocenter is in good position and the multileaf collimators contoured the treatment volume appropriately.

## 2013-06-19 ENCOUNTER — Ambulatory Visit
Admission: RE | Admit: 2013-06-19 | Discharge: 2013-06-19 | Disposition: A | Discharge status: Home / Self Care | Payer: Medicare Other | Source: Ambulatory Visit | Attending: Radiation Oncology | Admitting: Radiation Oncology

## 2013-06-22 ENCOUNTER — Ambulatory Visit
Admission: RE | Admit: 2013-06-22 | Discharge: 2013-06-22 | Disposition: A | Discharge status: Home / Self Care | Payer: Medicare Other | Source: Ambulatory Visit | Attending: Radiation Oncology | Admitting: Radiation Oncology

## 2013-06-22 ENCOUNTER — Encounter: Payer: Self-pay | Admitting: Radiation Oncology

## 2013-06-22 MED ORDER — BIAFINE EX EMUL
CUTANEOUS | Status: DC | PRN
  Administered 2013-06-22: 19:00:00 via TOPICAL

## 2013-06-22 NOTE — Progress Notes (Signed)
   Weekly Management Note:  outpatient Current Dose:  8.1 Gy  Projected Dose: 70.2 Gy   Narrative:  The patient presents for routine under treatment assessment.  CBCT/MVCT images/Port film x-rays were reviewed.  The chart was checked. No new complaints related to her radiotherapy.   Physical Findings:  height is 5\' 4"  (1.626 m) and weight is 160 lb 14.4 oz (72.984 kg). Her temperature is 97.6 F (36.4 C). Her blood pressure is 152/83 and her pulse is 75.    NAD, in WC   Impression:  The patient is tolerating radiotherapy.  Plan:  Continue radiotherapy as planned.   ________________________________   Lonie Peak, M.D.

## 2013-06-22 NOTE — Progress Notes (Signed)
Hailey Keith  Has received 3 fractions to her left Lung.  She currently denies any pain nor SOB.  She is traveling by Wheelchair with a family member present.  Given the Radiation Therapy and You booklet with education regarding potential side-effects related to radiation therapy such as esophagitis(pain), potentially late as relayed by Dr. Basilio Cairo, fatigue, and skin changes( dryness, redness, hyperpigmentation), skin care with Biafine lotion BID, before treatment and at bedtime, difficulty swallowing.  Will review information again.  Given this RN's business card.  Teach back.  Very receptive to education

## 2013-06-23 ENCOUNTER — Ambulatory Visit
Admission: RE | Admit: 2013-06-23 | Discharge: 2013-06-23 | Disposition: A | Discharge status: Home / Self Care | Payer: Medicare Other | Source: Ambulatory Visit | Attending: Radiation Oncology | Admitting: Radiation Oncology

## 2013-06-24 ENCOUNTER — Ambulatory Visit
Admission: RE | Admit: 2013-06-24 | Discharge: 2013-06-24 | Disposition: A | Discharge status: Home / Self Care | Payer: Medicare Other | Source: Ambulatory Visit | Attending: Radiation Oncology | Admitting: Radiation Oncology

## 2013-06-25 ENCOUNTER — Ambulatory Visit
Admission: RE | Admit: 2013-06-25 | Discharge: 2013-06-25 | Disposition: A | Discharge status: Home / Self Care | Payer: Medicare Other | Source: Ambulatory Visit | Attending: Radiation Oncology | Admitting: Radiation Oncology

## 2013-06-26 ENCOUNTER — Ambulatory Visit
Admission: RE | Admit: 2013-06-26 | Discharge: 2013-06-26 | Disposition: A | Discharge status: Home / Self Care | Payer: Medicare Other | Source: Ambulatory Visit | Attending: Radiation Oncology | Admitting: Radiation Oncology

## 2013-06-29 ENCOUNTER — Ambulatory Visit
Admission: RE | Admit: 2013-06-29 | Discharge: 2013-06-29 | Disposition: A | Discharge status: Home / Self Care | Payer: Medicare Other | Source: Ambulatory Visit | Attending: Radiation Oncology | Admitting: Radiation Oncology

## 2013-06-29 MED ORDER — RADIAPLEXRX EX GEL
Freq: Once | CUTANEOUS | Status: AC
  Administered 2013-06-29: 19:00:00 via TOPICAL

## 2013-06-29 NOTE — Addendum Note (Signed)
Encounter addended by: Delynn Flavin, RN on: 06/29/2013  6:59 PM<BR>     Documentation filed: Inpatient MAR

## 2013-06-29 NOTE — Progress Notes (Signed)
Hailey Keith here in a wheelchair with her daughter for weekly under treat visit.  She has had 8 fractions to her left lung.  She denies pain.  She does have fatigue.  She does have shortness of breath with activity but it is not any worse than before radiation.  She also has a cough and is bringing up white sputum.  She denies coughing up blood and a sore throat.  She has lost 2 lbs since last week.  She says that she is not eating as much.  She denies nausea.  The skin on her left chest is intact and she is using radiaplex gel.

## 2013-06-29 NOTE — Addendum Note (Signed)
Encounter addended by: Delynn Flavin, RN on: 06/29/2013  6:48 PM<BR>     Documentation filed: Orders

## 2013-06-29 NOTE — Progress Notes (Signed)
   Weekly Management Note:  outpatient Current Dose:  21.6 Gy  Projected Dose: 70.2 Gy   Narrative:  The patient presents for routine under treatment assessment.  CBCT/MVCT images/Port film x-rays were reviewed.  The chart was checked. NAD, minimal complaints. Coughing sputum, no blood. No esophagitis. Doing well, a little tired.  Physical Findings:  height is 5\' 4"  (1.626 m) and weight is 158 lb 12.8 oz (72.031 kg). Her temperature is 97.9 F (36.6 C). Her blood pressure is 144/79 and her pulse is 85. Her oxygen saturation is 96%.  NAD, in wheelchair.  Impression:  The patient is tolerating radiotherapy.  Plan:  Continue radiotherapy as planned.   ________________________________   Lonie Peak, M.D.

## 2013-06-30 ENCOUNTER — Ambulatory Visit
Admission: RE | Admit: 2013-06-30 | Discharge: 2013-06-30 | Disposition: A | Discharge status: Home / Self Care | Payer: Medicare Other | Source: Ambulatory Visit | Attending: Radiation Oncology | Admitting: Radiation Oncology

## 2013-07-01 ENCOUNTER — Ambulatory Visit
Admission: RE | Admit: 2013-07-01 | Discharge: 2013-07-01 | Disposition: A | Discharge status: Home / Self Care | Payer: Medicare Other | Source: Ambulatory Visit | Attending: Radiation Oncology | Admitting: Radiation Oncology

## 2013-07-02 ENCOUNTER — Ambulatory Visit
Admission: RE | Admit: 2013-07-02 | Discharge: 2013-07-02 | Disposition: A | Discharge status: Home / Self Care | Payer: Medicare Other | Source: Ambulatory Visit | Attending: Radiation Oncology | Admitting: Radiation Oncology

## 2013-07-03 ENCOUNTER — Ambulatory Visit
Admission: RE | Admit: 2013-07-03 | Discharge: 2013-07-03 | Disposition: A | Discharge status: Home / Self Care | Payer: Medicare Other | Source: Ambulatory Visit | Attending: Radiation Oncology | Admitting: Radiation Oncology

## 2013-07-06 ENCOUNTER — Ambulatory Visit
Admission: RE | Admit: 2013-07-06 | Discharge: 2013-07-06 | Disposition: A | Discharge status: Home / Self Care | Payer: Medicare Other | Source: Ambulatory Visit | Attending: Radiation Oncology | Admitting: Radiation Oncology

## 2013-07-06 ENCOUNTER — Encounter: Payer: Self-pay | Admitting: Radiation Oncology

## 2013-07-06 NOTE — Progress Notes (Signed)
Reports a productive cough with white sputum worse in the morning. Denies esophagitis. Reports her appetite is off and on. Reports she has already had two ensures today and is going to get bojangles chicken for dinner. Weight stable. Denies night sweats. Reports she took an aleve for a headache.

## 2013-07-06 NOTE — Progress Notes (Signed)
   Weekly Management Note:  outpatient Current Dose:  35.1 Gy  Projected Dose: 70.2 Gy   Narrative:  The patient presents for routine under treatment assessment.  CBCT/MVCT images/Port film x-rays were reviewed.  The chart was checked. Doing well.  No esophagitis.  White sputum cough, esp in AM. No fevers.  Physical Findings:  weight is 158 lb 14.4 oz (72.077 kg). Her blood pressure is 128/57 and her pulse is 87. Her respiration is 16.  Lungs - low pitched wheezes on expiration bilaterally.  Impression:  The patient is tolerating radiotherapy.  Plan:  Continue radiotherapy as planned.   ________________________________   Lonie Peak, M.D.

## 2013-07-07 ENCOUNTER — Ambulatory Visit
Admission: RE | Admit: 2013-07-07 | Discharge: 2013-07-07 | Disposition: A | Discharge status: Home / Self Care | Payer: Medicare Other | Source: Ambulatory Visit | Attending: Radiation Oncology | Admitting: Radiation Oncology

## 2013-07-08 ENCOUNTER — Encounter: Payer: Self-pay | Admitting: Radiation Oncology

## 2013-07-08 ENCOUNTER — Ambulatory Visit
Admission: RE | Admit: 2013-07-08 | Discharge: 2013-07-08 | Disposition: A | Discharge status: Home / Self Care | Payer: Medicare Other | Source: Ambulatory Visit | Attending: Radiation Oncology | Admitting: Radiation Oncology

## 2013-07-09 ENCOUNTER — Ambulatory Visit
Admission: RE | Admit: 2013-07-09 | Discharge: 2013-07-09 | Disposition: A | Discharge status: Home / Self Care | Payer: Medicare Other | Source: Ambulatory Visit | Attending: Radiation Oncology | Admitting: Radiation Oncology

## 2013-07-10 ENCOUNTER — Ambulatory Visit
Admission: RE | Admit: 2013-07-10 | Discharge: 2013-07-10 | Disposition: A | Discharge status: Home / Self Care | Payer: Medicare Other | Source: Ambulatory Visit | Attending: Radiation Oncology | Admitting: Radiation Oncology

## 2013-07-13 ENCOUNTER — Ambulatory Visit
Admission: RE | Admit: 2013-07-13 | Discharge: 2013-07-13 | Disposition: A | Discharge status: Home / Self Care | Payer: Medicare Other | Source: Ambulatory Visit | Attending: Radiation Oncology | Admitting: Radiation Oncology

## 2013-07-13 NOTE — Progress Notes (Signed)
Weekly Management Note:  Site: Left lung Current Dose:  4860  cGy Projected Dose: 7020  cGy  Narrative: The patient is seen today for routine under treatment assessment. CBCT/MVCT images/port films were reviewed. The chart was reviewed.   She is without complaints today. She does omit to mild fatigue. No respiratory difficulties.  Physical Examination:  Filed Vitals:   07/13/13 1800  BP: 133/64  Pulse: 81  Temp: 98.5 F (36.9 C)  .  Weight: 158 lb 14.4 oz (72.077 kg). She looks well. Lungs are clear.  Impression: Tolerating radiation therapy well. She'll finish her radiation therapy next week.  Plan: Continue radiation therapy as planned.

## 2013-07-13 NOTE — Progress Notes (Signed)
[place] here with her daughter for weekly under treat visit.  She has had 18 fractions to her left lung.  She denies pain except for a slight headache.  She does have fatigue.  She has a cough and shortness of breath that she had before radiation started and is not any worse.  She denies coughing up blood and a sore throat.  She has small pink/red area on her left chest.  She is using radiaplex gel.

## 2013-07-14 ENCOUNTER — Ambulatory Visit
Admission: RE | Admit: 2013-07-14 | Discharge: 2013-07-14 | Disposition: A | Discharge status: Home / Self Care | Payer: Medicare Other | Source: Ambulatory Visit | Attending: Radiation Oncology | Admitting: Radiation Oncology

## 2013-07-15 ENCOUNTER — Ambulatory Visit
Admission: RE | Admit: 2013-07-15 | Discharge: 2013-07-15 | Disposition: A | Discharge status: Home / Self Care | Payer: Medicare Other | Source: Ambulatory Visit | Attending: Radiation Oncology | Admitting: Radiation Oncology

## 2013-07-16 ENCOUNTER — Ambulatory Visit
Admission: RE | Admit: 2013-07-16 | Discharge: 2013-07-16 | Disposition: A | Discharge status: Home / Self Care | Payer: Medicare Other | Source: Ambulatory Visit | Attending: Radiation Oncology | Admitting: Radiation Oncology

## 2013-07-17 ENCOUNTER — Ambulatory Visit
Admission: RE | Admit: 2013-07-17 | Discharge: 2013-07-17 | Disposition: A | Discharge status: Home / Self Care | Payer: Medicare Other | Source: Ambulatory Visit | Attending: Radiation Oncology | Admitting: Radiation Oncology

## 2013-07-20 ENCOUNTER — Ambulatory Visit
Admission: RE | Admit: 2013-07-20 | Discharge: 2013-07-20 | Disposition: A | Discharge status: Home / Self Care | Payer: Medicare Other | Source: Ambulatory Visit | Attending: Radiation Oncology | Admitting: Radiation Oncology

## 2013-07-20 NOTE — Progress Notes (Signed)
Weekly Management Note:  Site: Left lung Current Dose:  6200  cGy Projected Dose: 7020  cGy  Narrative: The patient is seen today for routine under treatment assessment. CBCT/MVCT images/port films were reviewed. The chart was reviewed.   She is without complaints today. She does report mild fatigue. No respiratory difficulties except for occasional dry cough and mild dyspnea on exertion which is unchanged. She has just 3 more treatments.  Physical Examination:  Filed Vitals:   07/20/13 1817  BP: 144/63  Pulse: 81  Temp: 98.4 F (36.9 C)  .  Weight: 157 lb (71.215 kg). She looks well. Lungs are clear. There is slight erythema along the anterior posterior chest with patchy areas of dry desquamation along her back.  Impression: Tolerating radiation therapy well. She will finish her treatment this Thursday.  Plan: Continue radiation therapy as planned. One-month followup after completion of radiation.

## 2013-07-20 NOTE — Progress Notes (Signed)
Hailey Keith here with her daughter for weekly under treat visit.  She has had 23 fractions to her left lung.  She has a dry cough and occasional shortness of breath that is about the same as before treatment.  She denies coughing up blood and does not have a sore throat.  Her skin has a scattered red rash over her mid chest.  Her left upper back is dry and peeling.  She is using biafine cream.

## 2013-07-21 ENCOUNTER — Ambulatory Visit
Admission: RE | Admit: 2013-07-21 | Discharge: 2013-07-21 | Disposition: A | Discharge status: Home / Self Care | Payer: Medicare Other | Source: Ambulatory Visit | Attending: Radiation Oncology | Admitting: Radiation Oncology

## 2013-07-22 ENCOUNTER — Ambulatory Visit
Admission: RE | Admit: 2013-07-22 | Discharge: 2013-07-22 | Disposition: A | Discharge status: Home / Self Care | Payer: Medicare Other | Source: Ambulatory Visit | Attending: Radiation Oncology | Admitting: Radiation Oncology

## 2013-07-23 ENCOUNTER — Ambulatory Visit
Admission: RE | Admit: 2013-07-23 | Discharge: 2013-07-23 | Disposition: A | Discharge status: Home / Self Care | Payer: Medicare Other | Source: Ambulatory Visit | Attending: Radiation Oncology | Admitting: Radiation Oncology

## 2013-07-23 ENCOUNTER — Other Ambulatory Visit: Payer: Self-pay

## 2013-07-23 ENCOUNTER — Encounter: Payer: Self-pay | Admitting: Radiation Oncology

## 2013-07-23 DIAGNOSIS — I10 Essential (primary) hypertension: Secondary | ICD-10-CM

## 2013-07-23 MED ORDER — DOXAZOSIN MESYLATE 8 MG PO TABS: 8.0000 mg | ORAL_TABLET | Freq: Every day | ORAL | Status: DC

## 2013-07-23 NOTE — Progress Notes (Addendum)
[facility] Radiation Oncology End of Treatment Note  Name:Wilhemina Spies  Date: 07/23/2013 ZOX:[id number] DOB:1935-08-05   Status:outpatient    CC: Lucky Cowboy DAVID, MD  Dr. Si Gaul  REFERRING PHYSICIAN:   Dr. 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 She underwent daily image guidance with cone beam CT.            NARRATIVE: Ms. Lindell tolerated treatment well although she had patchy dry desquamation the skin along her back by completion of therapy. She uses Biafine cream during her course of treatment. I assume her care after the patient's third week of treatment while Dr. Basilio Cairo was out on maternity leave.   She did not receive chemotherapy.                       PLAN: Routine followup in one month. Patient instructed to call if questions or worsening complaints in interim.

## 2013-07-24 ENCOUNTER — Ambulatory Visit: Payer: Medicare Other

## 2013-07-27 ENCOUNTER — Ambulatory Visit: Payer: Medicare Other

## 2013-07-28 ENCOUNTER — Ambulatory Visit (INDEPENDENT_AMBULATORY_CARE_PROVIDER_SITE_OTHER): Payer: Medicare Other | Admitting: Internal Medicine

## 2013-07-28 ENCOUNTER — Encounter: Payer: Self-pay | Admitting: Internal Medicine

## 2013-07-28 ENCOUNTER — Ambulatory Visit: Payer: Medicare Other

## 2013-07-28 VITALS — BP 122/74 | HR 84 | Ht 64.25 in | Wt 159.0 lb

## 2013-07-28 DIAGNOSIS — F17200 Nicotine dependence, unspecified, uncomplicated: Secondary | ICD-10-CM

## 2013-07-28 DIAGNOSIS — J449 Chronic obstructive pulmonary disease, unspecified: Secondary | ICD-10-CM

## 2013-07-28 DIAGNOSIS — C3490 Malignant neoplasm of unspecified part of unspecified bronchus or lung: Secondary | ICD-10-CM

## 2013-07-28 MED ORDER — METHYLPREDNISOLONE ACETATE 80 MG/ML IJ SUSP
80.0000 mg | Freq: Once | INTRAMUSCULAR | Status: AC
  Administered 2013-07-28: 80 mg via INTRAMUSCULAR

## 2013-07-28 MED ORDER — AZITHROMYCIN 250 MG PO TABS: ORAL_TABLET | ORAL | Status: DC

## 2013-07-28 NOTE — Patient Instructions (Signed)
Depo 80  Script to hold for antibiotic Z pak

## 2013-07-28 NOTE — Progress Notes (Signed)
05/14/13- 32 yoF smoker referred courtesy of Dr Oneta Rack; review PET scan to do biopsy of lung. Daughter here. Left upper lobe mass found on chest x-ray with routine physical. She has been a one pack per day smoker, now down to a few cigarettes every other day. Dyspnea on exertion less than one flight of stairs, less than one city block. Daily cough with clear mucus. Denies chest pain, blood, fever, swollen glands. Has gained 15 pounds eating ice cream. Recently changed from Advair to nebulizer with albuterol, plus Spiriva. History of pneumonia with sepsis in the past. Denies heart disease or phlebitis. Right facial surgery for a nonmelanoma skin cancer She had lived alone but is now staying with her daughter. Retired from [employer] work. A sister has survived lung cancer.  Office Spirometry 05/14/13-  Moderate restrictive disease based on exhaled volume. FVC 1.58/57%, FEV1 1.41/68%, FEV1/FVC 0.89  CT 04/28/13 IMPRESSION:  There is a spiculated mass in the left upper lobe. The lesion has  finger-like projections towards the periphery which could represent  small satellite lesions or lymphangitic involvement. Findings are  suspicious for a primary bronchogenic neoplasm. There are a few  prominent but nonspecific mediastinal lymph nodes. Recommend a PET-  CT for further characterization of the left upper lung mass and  mediastinal lymph nodes.  Centrilobular emphysema. Scattered interstitial thickening with  areas of nodularity in the lungs, particularly the right side.  This most likely represents postinflammatory change. There is also  a small amount of focal consolidation in the right middle lobe.  Extensive atherosclerotic disease, including the coronary arteries.  These results will be called to the ordering clinician or  representative by the Radiologist Assistant, and communication  documented in the PACS Dashboard.  Original Report Authenticated By: Richarda Overlie,  M.D. PET8/27/14 IMPRESSION:  1. Hypermetabolic right upper lobe pulmonary mass consistent  bronchogenic carcinoma. Hypermetabolic nodule superior to the mass  consistent with local extension of tumor.  2. No hypermetabolic mediastinal lymph nodes  3. Small focus of metabolic activity in the left lower lobe is  likely inflammatory. Recommend attention on follow-up.  4. Small pulmonology nodule in the left lower lobe measuring 5 mm  does not have metabolic activity. Recommend attention on follow-up.  5. Pleural-parenchymal thickening in the right upper lobe is not  hypermetabolic and likely post inflammatory or infectious.  Electronically Signed  By: Genevive Bi M.D.  On: 05/13/2013 12:36   05/25/13- 53 yoF smoker referred courtesy of Dr Oneta Rack; LUL mass/ Squamous Cell Ca Needle bx POS Squamous Cell Ca Lung. To be discussed at [facility] in 3 days, and scheduled for MTOC. She tolerated needle biopsy very well, without complication. Little change in cough, no chest pain, blood or swollen glands. Blames "nerves" for insomnia, which began before prednisone. Prednisone is now finished. Xanax did not help.  07/28/13- 78 yoF smoker followed for COPD complicated by squamous cell CA lung/ XRT, insomnia Daughter here. FOLLOWS FOR: has noticed more of a cough past few days-productive-thick. Would like to have refill for Restoril. No fever, sore throat or swollen glands. Continues followup with radiation therapy after treatment.  Daughter here. ROS-see HPI Constitutional:   No-   weight loss, night sweats, fevers, chills, fatigue, lassitude. HEENT:   + headaches,no- difficulty swallowing, tooth/dental problems, sore throat,       No-  sneezing, itching, ear ache, nasal congestion, post nasal drip,  CV:  No-   chest pain, orthopnea, PND, swelling in lower extremities, anasarca,  dizziness,  palpitations Resp: +shortness of breath with exertion or at rest.              +  productive cough,  No  non-productive cough,  No- coughing up of blood.              No-   change in color of mucus.  No- wheezing.   Skin: No-   rash or lesions. GI:  No-   heartburn, indigestion, abdominal pain, nausea, vomiting,  GU:  MS:  No-   joint pain or swelling.   Neuro-     nothing unusual Psych:  No- change in mood or affect. + depression or anxiety.  No memory loss.  OBJ- Physical Exam General- Alert, Oriented, Affect-appropriate, Distress- none acute, trying to be cheerful Skin- rash-none, lesions- none, excoriation- none Lymphadenopathy- none Head- +surgical defect right temple            Eyes- Gross vision intact, PERRLA, conjunctivae and secretions clear            Ears- +Deaf R, +hearing aid  L            Nose- Clear, no-Septal dev, mucus, polyps, erosion, perforation             Throat- Mallampati II , mucosa clear , drainage- none, tonsils- atrophic Neck- flexible , trachea midline, no stridor , thyroid nl, carotid no bruit Chest - symmetrical excursion , unlabored           Heart/CV- RRR , no murmur , no gallop  , no rub, nl s1 s2                           - JVD- none , edema- none, stasis changes- none, varices- none           Lung- +wheeze and rattling cough , dullness-none, rub- none           Chest wall-  Abd-  Br/ Gen/ Rectal- Not done, not indicated Extrem- cyanosis- none, clubbing, none, atrophy- none, strength- nl Neuro- grossly intact to observation

## 2013-07-29 ENCOUNTER — Ambulatory Visit: Payer: Medicare Other

## 2013-08-03 ENCOUNTER — Ambulatory Visit: Payer: Medicare Other

## 2013-08-04 ENCOUNTER — Ambulatory Visit: Payer: Medicare Other

## 2013-08-05 ENCOUNTER — Ambulatory Visit: Payer: Medicare Other

## 2013-08-06 ENCOUNTER — Ambulatory Visit: Payer: Medicare Other

## 2013-08-07 ENCOUNTER — Ambulatory Visit: Payer: Medicare Other

## 2013-08-15 NOTE — Assessment & Plan Note (Signed)
She continues to smoke despite her diagnosis of lung cancer. Discussed efforts, but she is fatalistic now.

## 2013-08-15 NOTE — Assessment & Plan Note (Signed)
Followed by radiation oncology 

## 2013-08-15 NOTE — Assessment & Plan Note (Addendum)
Acute exacerbation Plan-Z-Pak, Depo-Medrol, smoking cessation

## 2013-08-31 ENCOUNTER — Encounter: Payer: Self-pay | Admitting: *Deleted

## 2013-09-02 ENCOUNTER — Telehealth: Payer: Self-pay | Admitting: Internal Medicine

## 2013-09-02 NOTE — Telephone Encounter (Signed)
Noted by patient via phone call.

## 2013-09-07 ENCOUNTER — Other Ambulatory Visit (HOSPITAL_BASED_OUTPATIENT_CLINIC_OR_DEPARTMENT_OTHER): Payer: Medicare Other

## 2013-09-07 ENCOUNTER — Ambulatory Visit (HOSPITAL_COMMUNITY)
Admission: RE | Admit: 2013-09-07 | Discharge: 2013-09-07 | Disposition: A | Discharge status: Home / Self Care | Payer: Medicare Other | Source: Ambulatory Visit | Attending: Internal Medicine | Admitting: Internal Medicine

## 2013-09-07 DIAGNOSIS — C3410 Malignant neoplasm of upper lobe, unspecified bronchus or lung: Secondary | ICD-10-CM

## 2013-09-07 DIAGNOSIS — J438 Other emphysema: Secondary | ICD-10-CM | POA: Insufficient documentation

## 2013-09-07 DIAGNOSIS — R091 Pleurisy: Secondary | ICD-10-CM | POA: Insufficient documentation

## 2013-09-07 DIAGNOSIS — J84112 Idiopathic pulmonary fibrosis: Secondary | ICD-10-CM | POA: Insufficient documentation

## 2013-09-07 DIAGNOSIS — R918 Other nonspecific abnormal finding of lung field: Secondary | ICD-10-CM | POA: Insufficient documentation

## 2013-09-07 LAB — COMPREHENSIVE METABOLIC PANEL (CC13)
ALBUMIN: 3.7 g/dL (ref 3.5–5.0)
ALT: 18 U/L (ref 0–55)
AST: 21 U/L (ref 5–34)
Alkaline Phosphatase: 83 U/L (ref 40–150)
Anion Gap: 8 mEq/L (ref 3–11)
BUN: 12.7 mg/dL (ref 7.0–26.0)
CALCIUM: 9.5 mg/dL (ref 8.4–10.4)
CHLORIDE: 105 meq/L (ref 98–109)
CO2: 28 mEq/L (ref 22–29)
Creatinine: 0.7 mg/dL (ref 0.6–1.1)
Glucose: 101 mg/dl (ref 70–140)
POTASSIUM: 4.4 meq/L (ref 3.5–5.1)
Sodium: 141 mEq/L (ref 136–145)
Total Bilirubin: 0.46 mg/dL (ref 0.20–1.20)
Total Protein: 6.5 g/dL (ref 6.4–8.3)

## 2013-09-07 MED ORDER — IOHEXOL 300 MG/ML  SOLN
80.0000 mL | Freq: Once | INTRAMUSCULAR | Status: AC | PRN
  Administered 2013-09-07: 80 mL via INTRAVENOUS

## 2013-09-08 ENCOUNTER — Encounter: Payer: Self-pay | Admitting: Radiation Oncology

## 2013-09-08 ENCOUNTER — Ambulatory Visit
Admission: RE | Admit: 2013-09-08 | Discharge: 2013-09-08 | Disposition: A | Discharge status: Home / Self Care | Payer: Medicare Other | Source: Ambulatory Visit | Attending: Radiation Oncology | Admitting: Radiation Oncology

## 2013-09-08 VITALS — BP 142/73 | HR 78 | Temp 97.6°F | Resp 22 | Wt 160.7 lb

## 2013-09-08 DIAGNOSIS — C3492 Malignant neoplasm of unspecified part of left bronchus or lung: Secondary | ICD-10-CM

## 2013-09-08 NOTE — Progress Notes (Addendum)
Follow up  S/p rad txs left upper lung, 06/18/13-07/23/13,  No coughing, nausea, eating well, no difficulty  Swallowing or chewing, just has runny nose c/o, in w/c, oxygen on room air =93%, sob with exertion with walking any distance,use oxygen at night and at times during the day 10:46 AM

## 2013-09-08 NOTE — Progress Notes (Signed)
CC: Dr. Curt Keith   Diagnosis: Stage IIB (T3, N0, M0) squamous cell carcinoma of the left upper lung  History: [name] returns today approximately 7 weeks following completion of radiation therapy in the management of her T3 N0 squamous cell carcinoma of the left upper lung. She is without complaints today. A CT scan of her chest on 09/07/2013, just 2 weeks after completion of radiation therapy, showed a decrease in volume of her left upper lung carcinoma. There was also some improvement in the nodularity peripheral to the dominant lesion along with stable small additional pulmonary nodules on the left. There appear to be improvement of the pleural parenchymal thickening in the right middle lobe. Her baseline dyspnea on exertion is unchanged.  Physical examination: Alert and oriented. Filed Vitals:   09/08/13 1042  BP: 142/73  Pulse: 78  Temp: 97.6 F (36.4 C)  Resp: 22   Head and neck examination: Grossly unremarkable. Nodes: There is no palpable cervical or supraclavicular adenopathy. Chest: Breath sounds distant, otherwise clear.  Laboratory data: Lab Results  Component Value Date   WBC 12.9* 05/27/2013   HGB 13.5 05/27/2013   HCT 40.7 05/27/2013   MCV 92.4 05/27/2013   PLT 206 05/27/2013   Impression: Clinically stable with favorable partial response by CT soon after completion of radiation therapy.  Plan: She'll maintain her followup with Dr. Julien Keith who will see her tomorrow. Followup through Dr. Julien Keith.

## 2013-09-09 ENCOUNTER — Telehealth: Payer: Self-pay | Admitting: Internal Medicine

## 2013-09-09 ENCOUNTER — Encounter: Payer: Self-pay | Admitting: Internal Medicine

## 2013-09-09 ENCOUNTER — Ambulatory Visit (HOSPITAL_BASED_OUTPATIENT_CLINIC_OR_DEPARTMENT_OTHER): Payer: Medicare Other | Admitting: Internal Medicine

## 2013-09-09 VITALS — BP 131/62 | HR 81 | Temp 97.9°F | Resp 18 | Ht 64.0 in | Wt 160.6 lb

## 2013-09-09 DIAGNOSIS — C3410 Malignant neoplasm of upper lobe, unspecified bronchus or lung: Secondary | ICD-10-CM

## 2013-09-09 DIAGNOSIS — C3492 Malignant neoplasm of unspecified part of left bronchus or lung: Secondary | ICD-10-CM

## 2013-09-09 DIAGNOSIS — J449 Chronic obstructive pulmonary disease, unspecified: Secondary | ICD-10-CM

## 2013-09-09 DIAGNOSIS — E119 Type 2 diabetes mellitus without complications: Secondary | ICD-10-CM

## 2013-09-09 DIAGNOSIS — I10 Essential (primary) hypertension: Secondary | ICD-10-CM

## 2013-09-09 NOTE — Patient Instructions (Signed)
Smoking Cessation Quitting smoking is important to your health and has many advantages. However, it is not always easy to quit since nicotine is a very addictive drug. Often times, people try 3 times or more before being able to quit. This document explains the best ways for you to prepare to quit smoking. Quitting takes hard work and a lot of effort, but you can do it. ADVANTAGES OF QUITTING SMOKING  You will live longer, feel better, and live better.  Your body will feel the impact of quitting smoking almost immediately.  Within 20 minutes, blood pressure decreases. Your pulse returns to its normal level.  After 8 hours, carbon monoxide levels in the blood return to normal. Your oxygen level increases.  After 24 hours, the chance of having a heart attack starts to decrease. Your breath, hair, and body stop smelling like smoke.  After 48 hours, damaged nerve endings begin to recover. Your sense of taste and smell improve.  After 72 hours, the body is virtually free of nicotine. Your bronchial tubes relax and breathing becomes easier.  After 2 to 12 weeks, lungs can hold more air. Exercise becomes easier and circulation improves.  The risk of having a heart attack, stroke, cancer, or lung disease is greatly reduced.  After 1 year, the risk of coronary heart disease is cut in half.  After 5 years, the risk of stroke falls to the same as a nonsmoker.  After 10 years, the risk of lung cancer is cut in half and the risk of other cancers decreases significantly.  After 15 years, the risk of coronary heart disease drops, usually to the level of a nonsmoker.  If you are pregnant, quitting smoking will improve your chances of having a healthy baby.  The people you live with, especially any children, will be healthier.  You will have extra money to spend on things other than cigarettes. QUESTIONS TO THINK ABOUT BEFORE ATTEMPTING TO QUIT You may want to talk about your answers with your  caregiver.  Why do you want to quit?  If you tried to quit in the past, what helped and what did not?  What will be the most difficult situations for you after you quit? How will you plan to handle them?  Who can help you through the tough times? Your family? Friends? A caregiver?  What pleasures do you get from smoking? What ways can you still get pleasure if you quit? Here are some questions to ask your caregiver:  How can you help me to be successful at quitting?  What medicine do you think would be best for me and how should I take it?  What should I do if I need more help?  What is smoking withdrawal like? How can I get information on withdrawal? GET READY  Set a quit date.  Change your environment by getting rid of all cigarettes, ashtrays, matches, and lighters in your home, car, or work. Do not let people smoke in your home.  Review your past attempts to quit. Think about what worked and what did not. GET SUPPORT AND ENCOURAGEMENT You have a better chance of being successful if you have help. You can get support in many ways.  Tell your family, friends, and co-workers that you are going to quit and need their support. Ask them not to smoke around you.  Get individual, group, or telephone counseling and support. Programs are available at local hospitals and health centers. Call your local health department for   information about programs in your area.  Spiritual beliefs and practices may help some smokers quit.  Download a "quit meter" on your computer to keep track of quit statistics, such as how long you have gone without smoking, cigarettes not smoked, and money saved.  Get a self-help book about quitting smoking and staying off of tobacco. LEARN NEW SKILLS AND BEHAVIORS  Distract yourself from urges to smoke. Talk to someone, go for a walk, or occupy your time with a task.  Change your normal routine. Take a different route to work. Drink tea instead of coffee.  Eat breakfast in a different place.  Reduce your stress. Take a hot bath, exercise, or read a book.  Plan something enjoyable to do every day. Reward yourself for not smoking.  Explore interactive web-based programs that specialize in helping you quit. GET MEDICINE AND USE IT CORRECTLY Medicines can help you stop smoking and decrease the urge to smoke. Combining medicine with the above behavioral methods and support can greatly increase your chances of successfully quitting smoking.  Nicotine replacement therapy helps deliver nicotine to your body without the negative effects and risks of smoking. Nicotine replacement therapy includes nicotine gum, lozenges, inhalers, nasal sprays, and skin patches. Some may be available over-the-counter and others require a prescription.  Antidepressant medicine helps people abstain from smoking, but how this works is unknown. This medicine is available by prescription.  Nicotinic receptor partial agonist medicine simulates the effect of nicotine in your brain. This medicine is available by prescription. Ask your caregiver for advice about which medicines to use and how to use them based on your health history. Your caregiver will tell you what side effects to look out for if you choose to be on a medicine or therapy. Carefully read the information on the package. Do not use any other product containing nicotine while using a nicotine replacement product.  RELAPSE OR DIFFICULT SITUATIONS Most relapses occur within the first 3 months after quitting. Do not be discouraged if you start smoking again. Remember, most people try several times before finally quitting. You may have symptoms of withdrawal because your body is used to nicotine. You may crave cigarettes, be irritable, feel very hungry, cough often, get headaches, or have difficulty concentrating. The withdrawal symptoms are only temporary. They are strongest when you first quit, but they will go away within  10 14 days. To reduce the chances of relapse, try to:  Avoid drinking alcohol. Drinking lowers your chances of successfully quitting.  Reduce the amount of caffeine you consume. Once you quit smoking, the amount of caffeine in your body increases and can give you symptoms, such as a rapid heartbeat, sweating, and anxiety.  Avoid smokers because they can make you want to smoke.  Do not let weight gain distract you. Many smokers will gain weight when they quit, usually less than 10 pounds. Eat a healthy diet and stay active. You can always lose the weight gained after you quit.  Find ways to improve your mood other than smoking. FOR MORE INFORMATION  www.smokefree.gov  Document Released: 08/14/2001 Document Revised: 02/19/2012 Document Reviewed: 11/29/2011 [employer] Patient Information 2014 [employer].  

## 2013-09-09 NOTE — Progress Notes (Signed)
[place] [phone]   Fax:([phone]) [place] [address] [place] [address]  DIAGNOSIS: Stage IIB (T3, N0, M0) non-small cell lung cancer, squamous cell carcinoma of the left upper lung diagnosed in August of 2014.  PRIOR THERAPY: Curative radiotherapy to the left upper lobe lung mass under the care of Dr. Valere Dross completed on 07/23/2013.  CURRENT THERAPY: Observation.  INTERVAL HISTORY: Hailey Keith 79 y.o. female returns to the clinic today for followup visit accompanied by her daughter. The patient tolerated the previous course of curative radiotherapy to the left upper lobe lung mass fairly well with no significant adverse effects. Unfortunately she continues to smoke but she is down on the number of cigarettes to 3 cigarettes a day. She denied having any significant chest pain but continues to have shortness of breath with exertion with no cough or hemoptysis. She denied having any weight loss or night sweats. She had repeat CT scan of the chest performed recently and she is here for evaluation and discussion of her scan results.  MEDICAL HISTORY: Past Medical History  Diagnosis Date  . COPD (chronic obstructive pulmonary disease)   . Hypertension   . Dyslipidemia   . Diabetes mellitus     Diet controlled   . Hard of hearing   . 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 Skin cancer     basal cell  . Hx of radiation therapy 06/18/13- 07/23/13    LUL lung primary, 7020 cGy 26 sessions    ALLERGIES:  has No Known Allergies.  MEDICATIONS:  Current Outpatient Prescriptions  Medication Sig Dispense Refill  . ALPRAZolam (XANAX) 0.5 MG tablet Take 0.5 mg by mouth 3 (three) times daily as needed. For anxiety      . aspirin 81 MG tablet Take 81 mg by mouth daily.      Marland Kitchen atorvastatin (LIPITOR) 80 MG tablet Take 80 mg by  mouth. On Sunday and Wednesday      . citalopram (CELEXA) 40 MG tablet Take 40 mg by mouth every morning.       Marland Kitchen doxazosin (CARDURA) 8 MG tablet Take 1 tablet (8 mg total) by mouth at bedtime.  90 tablet  0  . Fluticasone-Salmeterol (ADVAIR) 500-50 MCG/DOSE AEPB Inhale 1 puff into the lungs every 12 (twelve) hours.      Marland Kitchen guaifenesin (HUMIBID E) 400 MG TABS tablet Take 400 mg by mouth 2 (two) times daily.      Marland Kitchen losartan (COZAAR) 100 MG tablet Take 100 mg by mouth at bedtime.       Marland Kitchen nystatin (MYCOSTATIN) 100000 UNIT/ML suspension Take 5 mLs (500,000 Units total) by mouth 4 (four) times daily. Swish and gargle for approx 1 minute, then swallow. Take for approx 9 days.  180 mL  0  . temazepam (RESTORIL) 15 MG capsule Take 15 mg by mouth at bedtime as needed.      . tiotropium (SPIRIVA) 18 MCG inhalation capsule Place 18 mcg into inhaler and inhale daily.      . Vitamin D, Ergocalciferol, (DRISDOL) 50000 UNITS CAPS capsule Take 50,000 Units by mouth every Wednesday.        No current facility-administered medications for this visit.    SURGICAL HISTORY:  Past Surgical History  Procedure Laterality Date  . Tonsillectomy      as  child, repeat surgery early 20's  . Mohs surgery      "several times"  . Facial reconstruction surgery  25 years ago    to remove skin cancer, right side of face    REVIEW OF SYSTEMS:  A comprehensive review of systems was negative except for: Respiratory: positive for dyspnea on exertion   PHYSICAL EXAMINATION: General appearance: alert, cooperative and no distress Head: Normocephalic, without obvious abnormality, atraumatic Neck: no adenopathy, no JVD, supple, symmetrical, trachea midline and thyroid not enlarged, symmetric, no tenderness/mass/nodules Lymph nodes: Cervical, supraclavicular, and axillary nodes normal. Resp: clear to auscultation bilaterally Back: symmetric, no curvature. ROM normal. No CVA tenderness. Cardio: regular rate and rhythm, S1, S2  normal, no murmur, click, rub or gallop GI: soft, non-tender; bowel sounds normal; no masses,  no organomegaly Extremities: extremities normal, atraumatic, no cyanosis or edema  ECOG PERFORMANCE STATUS: 2 - Symptomatic, <50% confined to bed  Blood pressure 131/62, pulse 81, temperature 97.9 F (36.6 C), temperature source Oral, resp. rate 18, height 5\' 4"  (1.626 m), weight 160 lb 9.6 oz (72.848 kg).  LABORATORY DATA: Lab Results  Component Value Date   WBC 12.9* 05/27/2013   HGB 13.5 05/27/2013   HCT 40.7 05/27/2013   MCV 92.4 05/27/2013   PLT 206 05/27/2013      Chemistry      Component Value Date/Time   NA 141 09/07/2013 1517   NA 138 05/14/2013 1717   K 4.4 09/07/2013 1517   K 4.3 05/14/2013 1717   CL 100 05/14/2013 1717   CO2 28 09/07/2013 1517   CO2 32 05/14/2013 1717   BUN 12.7 09/07/2013 1517   BUN 14 05/14/2013 1717   CREATININE 0.7 09/07/2013 1517   CREATININE 0.7 05/14/2013 1717      Component Value Date/Time   CALCIUM 9.5 09/07/2013 1517   CALCIUM 9.9 05/14/2013 1717   ALKPHOS 83 09/07/2013 1517   ALKPHOS 74 05/14/2013 1717   AST 21 09/07/2013 1517   AST 26 05/14/2013 1717   ALT 18 09/07/2013 1517   ALT 16 05/14/2013 1717   BILITOT 0.46 09/07/2013 1517   BILITOT 0.5 05/14/2013 1717       RADIOGRAPHIC STUDIES: Ct Chest W Contrast  09/07/2013   CLINICAL DATA:  Restaging left lung cancer.  EXAM: CT CHEST WITH CONTRAST  TECHNIQUE: Multidetector CT imaging of the chest was performed during intravenous contrast administration.  CONTRAST:  47mL OMNIPAQUE IOHEXOL 300 MG/ML  SOLN  COMPARISON:  PET-CT scan 05/12/2013, biopsy CT scan 05/20/2013  FINDINGS: Left upper lobe mass is decreased to nodular size measuring 24 mm x 11 mm compared to 33 mm x 22 mm on comparison PET-CT scan. Nodularity peripheral to lesion is also decreased in volume. 5 mm nodule in the left upper lobe (image 31, series 5) is unchanged. Ill-defined 5 mm nodule in the left lower lobe (image 41) is unchanged. The smaller nodules are  not hypermetabolic comparison PET-CT scan. The pleural parenchymal nodular thickening in the right middle lobe is improved compared to prior.  There is again demonstrated centrilobular emphysema in the upper lobes and mild peripheral interstitial fibrosis in the lower lobes.  Limited view of the upper abdomen demonstrates normal adrenal glands. The liver is partially imaged without focal lesion. No aggressive osseous lesion.  IMPRESSION: 1. Interval decrease and volume of left upper lobe bronchogenic carcinoma which is now nodular size compared to mass size on prior. 2. Some improvement in the nodularity peripheral to the dominant lesion. 3.  Stable small additional pulmonary nodules on the left. 4. Improvement pleural parenchymal thickening in the right middle lobe.   Electronically Signed   By: Suzy Bouchard M.D.   On: 09/07/2013 16:44    ASSESSMENT AND PLAN: This is a very pleasant 79 years old white female with history of stage IIb non-small cell lung cancer, squamous cell carcinoma status post curative radiotherapy to the left upper lobe lung mass with partial response. I discussed the scan results with the patient and her daughter. I recommended for her to continue on observation for now with repeat CT scan of the chest in 3 months. She was advised to call immediately if she has any concerning symptoms in the interval. The patient voices understanding of current disease status and treatment options and is in agreement with the current care plan.  All questions were answered. The patient knows to call the clinic with any problems, questions or concerns. We can certainly see the patient much sooner if necessary.

## 2013-09-09 NOTE — Telephone Encounter (Signed)
gv pt appt schedule for april. [facility] will call  pt w/scan appt - pt aware.

## 2013-09-30 ENCOUNTER — Encounter: Payer: Self-pay | Admitting: Internal Medicine

## 2013-09-30 ENCOUNTER — Ambulatory Visit (INDEPENDENT_AMBULATORY_CARE_PROVIDER_SITE_OTHER): Payer: Medicare Other | Admitting: Internal Medicine

## 2013-09-30 VITALS — BP 122/70 | HR 88 | Temp 97.9°F | Resp 16 | Wt 163.2 lb

## 2013-09-30 DIAGNOSIS — R7303 Prediabetes: Secondary | ICD-10-CM | POA: Insufficient documentation

## 2013-09-30 DIAGNOSIS — I10 Essential (primary) hypertension: Secondary | ICD-10-CM

## 2013-09-30 DIAGNOSIS — Z79899 Other long term (current) drug therapy: Secondary | ICD-10-CM | POA: Insufficient documentation

## 2013-09-30 DIAGNOSIS — J209 Acute bronchitis, unspecified: Secondary | ICD-10-CM

## 2013-09-30 DIAGNOSIS — E785 Hyperlipidemia, unspecified: Secondary | ICD-10-CM

## 2013-09-30 DIAGNOSIS — E559 Vitamin D deficiency, unspecified: Secondary | ICD-10-CM | POA: Insufficient documentation

## 2013-09-30 DIAGNOSIS — E119 Type 2 diabetes mellitus without complications: Secondary | ICD-10-CM

## 2013-09-30 DIAGNOSIS — R7309 Other abnormal glucose: Secondary | ICD-10-CM

## 2013-09-30 LAB — CBC WITH DIFFERENTIAL/PLATELET
Basophils Absolute: 0 10*3/uL (ref 0.0–0.1)
Basophils Relative: 0 % (ref 0–1)
EOS PCT: 2 % (ref 0–5)
Eosinophils Absolute: 0.2 10*3/uL (ref 0.0–0.7)
HEMATOCRIT: 40.8 % (ref 36.0–46.0)
Hemoglobin: 13.8 g/dL (ref 12.0–15.0)
LYMPHS ABS: 1.1 10*3/uL (ref 0.7–4.0)
LYMPHS PCT: 12 % (ref 12–46)
MCH: 30.7 pg (ref 26.0–34.0)
MCHC: 33.8 g/dL (ref 30.0–36.0)
MCV: 90.9 fL (ref 78.0–100.0)
MONO ABS: 1 10*3/uL (ref 0.1–1.0)
Monocytes Relative: 11 % (ref 3–12)
Neutro Abs: 6.9 10*3/uL (ref 1.7–7.7)
Neutrophils Relative %: 75 % (ref 43–77)
Platelets: 221 10*3/uL (ref 150–400)
RBC: 4.49 MIL/uL (ref 3.87–5.11)
RDW: 14.6 % (ref 11.5–15.5)
WBC: 9.1 10*3/uL (ref 4.0–10.5)

## 2013-09-30 MED ORDER — LEVOFLOXACIN 500 MG PO TABS: 500.0000 mg | ORAL_TABLET | Freq: Every day | ORAL | Status: AC

## 2013-09-30 MED ORDER — PREDNISONE 20 MG PO TABS
20.0000 mg | ORAL_TABLET | ORAL | Status: DC
Start: 2013-09-30 — End: 2013-11-24

## 2013-09-30 MED ORDER — HYDROCODONE-ACETAMINOPHEN 5-325 MG PO TABS: ORAL_TABLET | ORAL | Status: DC

## 2013-09-30 NOTE — Patient Instructions (Signed)
Chronic Obstructive Pulmonary Disease Chronic obstructive pulmonary disease (COPD) is a common lung condition in which airflow from the lungs is limited. COPD is a general term that can be used to describe many different lung problems that limit airflow, including both chronic bronchitis and emphysema. If you have COPD, your lung function will probably never return to normal, but there are measures you can take to improve lung function and make yourself feel better.  CAUSES   Smoking (common).   Exposure to secondhand smoke.   Genetic problems.  Chronic inflammatory lung diseases or recurrent infections. SYMPTOMS   Shortness of breath, especially with physical activity.   Deep, persistent (chronic) cough with a large amount of thick mucus.   Wheezing.   Rapid breaths (tachypnea).   Gray or bluish discoloration (cyanosis) of the skin, especially in fingers, toes, or lips.   Fatigue.   Weight loss.   Frequent infections or episodes when breathing symptoms become much worse (exacerbations).   Chest tightness. DIAGNOSIS  Your healthcare provider will take a medical history and perform a physical examination to make the initial diagnosis. Additional tests for COPD may include:   Lung (pulmonary) function tests.  Chest X-ray.  CT scan.  Blood tests. TREATMENT  Treatment available to help you feel better when you have COPD include:   Inhaler and nebulizer medicines. These help manage the symptoms of COPD and make your breathing more comfortable  Supplemental oxygen. Supplemental oxygen is only helpful if you have a low oxygen level in your blood.   Exercise and physical activity. These are beneficial for nearly all people with COPD. Some people may also benefit from a pulmonary rehabilitation program. HOME CARE INSTRUCTIONS   Take all medicines (inhaled or pills) as directed by your health care provider.  Only take over-the-counter or prescription medicines  for pain, fever, or discomfort as directed by your health care provider.   Avoid over-the-counter medicines or cough syrups that dry up your airway (such as antihistamines) and slow down the elimination of secretions unless instructed otherwise by your healthcare provider.   If you are a smoker, the most important thing that you can do is stop smoking. Continuing to smoke will cause further lung damage and breathing trouble. Ask your health care provider for help with quitting smoking. He or she can direct you to community resources or hospitals that provide support.  Avoid exposure to irritants such as smoke, chemicals, and fumes that aggravate your breathing.  Use oxygen therapy and pulmonary rehabilitation if directed by your health care provider. If you require home oxygen therapy, ask your healthcare provider whether you should purchase a pulse oximeter to measure your oxygen level at home.   Avoid contact with individuals who have a contagious illness.  Avoid extreme temperature and humidity changes.  Eat healthy foods. Eating smaller, more frequent meals and resting before meals may help you maintain your strength.  Stay active, but balance activity with periods of rest. Exercise and physical activity will help you maintain your ability to do things you want to do.  Preventing infection and hospitalization is very important when you have COPD. Make sure to receive all the vaccines your health care provider recommends, especially the pneumococcal and influenza vaccines. Ask your healthcare provider whether you need a pneumonia vaccine.  Learn and use relaxation techniques to manage stress.  Learn and use controlled breathing techniques as directed by your health care provider. Controlled breathing techniques include:   Pursed lip breathing. Start by breathing  in (inhaling) through your nose for 1 second. Then, purse your lips as if you were going to whistle and breathe out (exhale)  through the pursed lips for 2 seconds.   Diaphragmatic breathing. Start by putting one hand on your abdomen just above your waist. Inhale slowly through your nose. The hand on your abdomen should move out. Then purse your lips and exhale slowly. You should be able to feel the hand on your abdomen moving in as you exhale.   Learn and use controlled coughing to clear mucus from your lungs. Controlled coughing is a series of short, progressive coughs. The steps of controlled coughing are:  1. Lean your head slightly forward.  2. Breathe in deeply using diaphragmatic breathing.  3. Try to hold your breath for 3 seconds.  4. Keep your mouth slightly open while coughing twice.  5. Spit any mucus out into a tissue.  6. Rest and repeat the steps once or twice as needed. SEEK MEDICAL CARE IF:   You are coughing up more mucus than usual.   There is a change in the color or thickness of your mucus.   Your breathing is more labored than usual.   Your breathing is faster than usual.  SEEK IMMEDIATE MEDICAL CARE IF:   You have shortness of breath while you are resting.   You have shortness of breath that prevents you from:  Being able to talk.   Performing your usual physical activities.   You have chest pain lasting longer than 5 minutes.   Your skin color is more cyanotic than usual.  You measure low oxygen saturations for longer than 5 minutes with a pulse oximeter. MAKE SURE YOU:   Understand these instructions.  Will watch your condition.  Will get help right away if you are not doing well or get worse. Document Released: 05/30/2005 Document Revised: 06/10/2013 Document Reviewed: 04/16/2013 [facility] Patient Information 2014 [place], [place].   Hypertension As your heart beats, it forces blood through your arteries. This force is your blood pressure. If the pressure is too high, it is called hypertension (HTN) or high blood pressure. HTN is dangerous because you  may have it and not know it. High blood pressure may mean that your heart has to work harder to pump blood. Your arteries may be narrow or stiff. The extra work puts you at risk for heart disease, stroke, and other problems.  Blood pressure consists of two numbers, a higher number over a lower, 110/72, for example. It is stated as "110 over 72." The ideal is below 120 for the top number (systolic) and under 80 for the bottom (diastolic). Write down your blood pressure today. You should pay close attention to your blood pressure if you have certain conditions such as:  Heart failure.  Prior heart attack.  Diabetes  Chronic kidney disease.  Prior stroke.  Multiple risk factors for heart disease. To see if you have HTN, your blood pressure should be measured while you are seated with your arm held at the level of the heart. It should be measured at least twice. A one-time elevated blood pressure reading (especially in the Emergency Department) does not mean that you need treatment. There may be conditions in which the blood pressure is different between your right and left arms. It is important to see your caregiver soon for a recheck. Most people have essential hypertension which means that there is not a specific cause. This type of high blood pressure may be  lowered by changing lifestyle factors such as:  Stress.  Smoking.  Lack of exercise.  Excessive weight.  Drug/tobacco/alcohol use.  Eating less salt. Most people do not have symptoms from high blood pressure until it has caused damage to the body. Effective treatment can often prevent, delay or reduce that damage. TREATMENT  When a cause has been identified, treatment for high blood pressure is directed at the cause. There are a large number of medications to treat HTN. These fall into several categories, and your caregiver will help you select the medicines that are best for you. Medications may have side effects. You should  review side effects with your caregiver. If your blood pressure stays high after you have made lifestyle changes or started on medicines,   Your medication(s) may need to be changed.  Other problems may need to be addressed.  Be certain you understand your prescriptions, and know how and when to take your medicine.  Be sure to follow up with your caregiver within the time frame advised (usually within two weeks) to have your blood pressure rechecked and to review your medications.  If you are taking more than one medicine to lower your blood pressure, make sure you know how and at what times they should be taken. Taking two medicines at the same time can result in blood pressure that is too low. SEEK IMMEDIATE MEDICAL CARE IF:  You develop a severe headache, blurred or changing vision, or confusion.  You have unusual weakness or numbness, or a faint feeling.  You have severe chest or abdominal pain, vomiting, or breathing problems. MAKE SURE YOU:   Understand these instructions.  Will watch your condition.  Will get help right away if you are not doing well or get worse.   Diabetes and Exercise Exercising regularly is important. It is not just about losing weight. It has many health benefits, such as:  Improving your overall fitness, flexibility, and endurance.  Increasing your bone density.  Helping with weight control.  Decreasing your body fat.  Increasing your muscle strength.  Reducing stress and tension.  Improving your overall health. People with diabetes who exercise gain additional benefits because exercise:  Reduces appetite.  Improves the body's use of blood sugar (glucose).  Helps lower or control blood glucose.  Decreases blood pressure.  Helps control blood lipids (such as cholesterol and triglycerides).  Improves the body's use of the hormone insulin by:  Increasing the body's insulin sensitivity.  Reducing the body's insulin  needs.  Decreases the risk for heart disease because exercising:  Lowers cholesterol and triglycerides levels.  Increases the levels of good cholesterol (such as high-density lipoproteins [HDL]) in the body.  Lowers blood glucose levels. YOUR ACTIVITY PLAN  Choose an activity that you enjoy and set realistic goals. Your health care provider or diabetes educator can help you make an activity plan that works for you. You can break activities into 2 or 3 sessions throughout the day. Doing so is as good as one long session. Exercise ideas include:  Taking the dog for a walk.  Taking the stairs instead of the elevator.  Dancing to your favorite song.  Doing your favorite exercise with a friend. RECOMMENDATIONS FOR EXERCISING WITH TYPE 1 OR TYPE 2 DIABETES   Check your blood glucose before exercising. If blood glucose levels are greater than 240 mg/dL, check for urine ketones. Do not exercise if ketones are present.  Avoid injecting insulin into areas of the body that are going  to be exercised. For example, avoid injecting insulin into:  The arms when playing tennis.  The legs when jogging.  Keep a record of:  Food intake before and after you exercise.  Expected peak times of insulin action.  Blood glucose levels before and after you exercise.  The type and amount of exercise you have done.  Review your records with your health care provider. Your health care provider will help you to develop guidelines for adjusting food intake and insulin amounts before and after exercising.  If you take insulin or oral hypoglycemic agents, watch for signs and symptoms of hypoglycemia. They include:  Dizziness.  Shaking.  Sweating.  Chills.  Confusion.  Drink plenty of water while you exercise to prevent dehydration or heat stroke. Body water is lost during exercise and must be replaced.  Talk to your health care provider before starting an exercise program to make sure it is safe  for you. Remember, almost any type of activity is better than none.    Cholesterol Cholesterol is a white, waxy, fat-like protein needed by your body in small amounts. The liver makes all the cholesterol you need. It is carried from the liver by the blood through the blood vessels. Deposits (plaque) may build up on blood vessel walls. This makes the arteries narrower and stiffer. Plaque increases the risk for heart attack and stroke. You cannot feel your cholesterol level even if it is very high. The only way to know is by a blood test to check your lipid (fats) levels. Once you know your cholesterol levels, you should keep a record of the test results. Work with your caregiver to to keep your levels in the desired range. WHAT THE RESULTS MEAN:  Total cholesterol is a rough measure of all the cholesterol in your blood.  LDL is the so-called bad cholesterol. This is the type that deposits cholesterol in the walls of the arteries. You want this level to be low.  HDL is the good cholesterol because it cleans the arteries and carries the LDL away. You want this level to be high.  Triglycerides are fat that the body can either burn for energy or store. High levels are closely linked to heart disease. DESIRED LEVELS:  Total cholesterol below 200.  LDL below 100 for people at risk, below 70 for very high risk.  HDL above 50 is good, above 60 is best.  Triglycerides below 150. HOW TO LOWER YOUR CHOLESTEROL:  Diet.  Choose fish or white meat chicken and [place], roasted or baked. Limit fatty cuts of red meat, fried foods, and processed meats, such as sausage and lunch meat.  Eat lots of fresh fruits and vegetables. Choose whole grains, beans, pasta, potatoes and cereals.  Use only small amounts of olive, corn or canola oils. Avoid butter, mayonnaise, shortening or palm kernel oils. Avoid foods with trans-fats.  Use skim/nonfat milk and low-fat/nonfat yogurt and cheeses. Avoid whole milk,  cream, ice cream, egg yolks and cheeses. Healthy desserts include angel food cake, ginger snaps, animal crackers, hard candy, popsicles, and low-fat/nonfat frozen yogurt. Avoid pastries, cakes, pies and cookies.  Exercise.  A regular program helps decrease LDL and raises HDL.  Helps with weight control.  Do things that increase your activity level like gardening, walking, or taking the stairs.  Medication.  May be prescribed by your caregiver to help lowering cholesterol and the risk for heart disease.  You may need medicine even if your levels are normal if you have  several risk factors. HOME CARE INSTRUCTIONS  7. Follow your diet and exercise programs as suggested by your caregiver. 8. Take medications as directed. 9. Have blood work done when your caregiver feels it is necessary. MAKE SURE YOU:   Understand these instructions.  Will watch your condition.  Will get help right away if you are not doing well or get worse.      Vitamin D Deficiency Vitamin D is an important vitamin that your body needs. Having too little of it in your body is called a deficiency. A very bad deficiency can make your bones soft and can cause a condition called rickets.  Vitamin D is important to your body for different reasons, such as:   It helps your body absorb 2 minerals called calcium and phosphorus.  It helps make your bones healthy.  It may prevent some diseases, such as diabetes and multiple sclerosis.  It helps your muscles and heart. You can get vitamin D in several ways. It is a natural part of some foods. The vitamin is also added to some dairy products and cereals. Some people take vitamin D supplements. Also, your body makes vitamin D when you are in the sun. It changes the sun's rays into a form of the vitamin that your body can use. CAUSES   Not eating enough foods that contain vitamin D.  Not getting enough sunlight.  Having certain digestive system diseases that make it  hard to absorb vitamin D. These diseases include Crohn's disease, chronic pancreatitis, and cystic fibrosis.  Having a surgery in which part of the stomach or small intestine is removed.  Being obese. Fat cells pull vitamin D out of your blood. That means that obese people may not have enough vitamin D left in their blood and in other body tissues.  Having chronic kidney or liver disease. RISK FACTORS Risk factors are things that make you more likely to develop a vitamin D deficiency. They include:  Being older.  Not being able to get outside very much.  Living in a nursing home.  Having had broken bones.  Having weak or thin bones (osteoporosis).  Having a disease or condition that changes how your body absorbs vitamin D.  Having dark skin.  Some medicines such as seizure medicines or steroids.  Being overweight or obese. SYMPTOMS Mild cases of vitamin D deficiency may not have any symptoms. If you have a very bad case, symptoms may include:  Bone pain.  Muscle pain.  Falling often.  Broken bones caused by a minor injury, due to osteoporosis. DIAGNOSIS A blood test is the best way to tell if you have a vitamin D deficiency. TREATMENT Vitamin D deficiency can be treated in different ways. Treatment for vitamin D deficiency depends on what is causing it. Options include:  Taking vitamin D supplements.  Taking a calcium supplement. Your caregiver will suggest what dose is best for you. HOME CARE INSTRUCTIONS  Take any supplements that your caregiver prescribes. Follow the directions carefully. Take only the suggested amount.  Have your blood tested 2 months after you start taking supplements.  Eat foods that contain vitamin D. Healthy choices include:  Fortified dairy products, cereals, or juices. Fortified means vitamin D has been added to the food. Check the label on the package to be sure.  Fatty fish like salmon or trout.  Eggs.  Oysters.  Do not use a  tanning bed.  Keep your weight at a healthy level. Lose weight if you  need to.  Keep all follow-up appointments. Your caregiver will need to perform blood tests to make sure your vitamin D deficiency is going away. SEEK MEDICAL CARE IF:  You have any questions about your treatment.  You continue to have symptoms of vitamin D deficiency.  You have nausea or vomiting.  You are constipated.  You feel confused.  You have severe abdominal or back pain. MAKE SURE YOU:  Understand these instructions.  Will watch your condition.  Will get help right away if you are not doing well or get worse.

## 2013-09-30 NOTE — Progress Notes (Signed)
Patient ID: Hailey Keith, female   DOB: Jul 09, 1935, 79 y.o.   MRN: [id number]   This very nice 79 y.o. DWF presents for 3 month follow up having recently completed Radiation Therapy for a LUL Lung Cancer and is being followed by Dr Earlie Server and Dr Valere Dross. Thus far ChemoTx has been held pending response to radiation.She is also being followed for Hypertension, Hyperlipidemia, COPD, Pre-Diabetes and Vitamin D Deficiency.    HTN predates since 77. BP has been controlled at home. Today's BP: 122/70 mmHg . Patient denies any cardiac type chest pain, palpitations, dyspnea/orthopnea/PND, dizziness, claudication, or dependent edema.   Hyperlipidemia is controlled with diet & meds. Last Cholesterol was  150, Triglycerides were  62, HDL 57 and LDL 81 in July 2014. Patient denies myalgias or other med SE's.    Also, the patient has history of PreDiabetes  with A1c 6.2% in July 2013 and with last A1c of  5.4%  In July 2014. Patient denies any symptoms of reactive hypoglycemia, diabetic polys, paresthesias or visual blurring.   Further, Patient has history of Vitamin D Deficiency of 18 in 2088 with last vitamin D of  72 in July 2014 . Patient supplements vitamin D without any suspected side-effects.   Lastly, she does have a congested cough today and describe a greenish mucopurulent sputum. She remains on nasal O2 at 2 lpm.    Medication List       ALPRAZolam 0.5 MG tablet  Commonly known as:  XANAX  Take 0.5 mg by mouth 3 (three) times daily as needed. For anxiety     aspirin 81 MG tablet  Take 81 mg by mouth daily.     atorvastatin 80 MG tablet  Commonly known as:  LIPITOR  Take 80 mg by mouth. On Sunday and Wednesday     citalopram 40 MG tablet  Commonly known as:  CELEXA  Take 40 mg by mouth every morning.     doxazosin 8 MG tablet  Commonly known as:  CARDURA  Take 1 tablet (8 mg total) by mouth at bedtime.     Fluticasone-Salmeterol 500-50 MCG/DOSE Aepb  Commonly known as:  ADVAIR   Inhale 1 puff into the lungs every 12 (twelve) hours.     guaifenesin 400 MG Tabs tablet  Commonly known as:  HUMIBID E  Take 400 mg by mouth 2 (two) times daily.     HYDROcodone-acetaminophen 5-325 MG per tablet  Commonly known as:  NORCO  1/2 to 1 tablet every 3 to 4 hours as needed for pain or cough     levofloxacin 500 MG tablet  Commonly known as:  LEVAQUIN  Take 1 tablet (500 mg total) by mouth daily. For infection     losartan 100 MG tablet  Commonly known as:  COZAAR  Take 100 mg by mouth at bedtime.     predniSONE 20 MG tablet  Commonly known as:  DELTASONE  Take 1 tablet (20 mg total) by mouth See admin instructions. 1 tab 3 x day for 3 days, then 1 tab 2 x day for 3 days, then 1 tab 1 x day for 5 days     temazepam 15 MG capsule  Commonly known as:  RESTORIL  Take 15 mg by mouth at bedtime as needed.     tiotropium 18 MCG inhalation capsule  Commonly known as:  SPIRIVA  Place 18 mcg into inhaler and inhale daily.     Vitamin D (Ergocalciferol) 50000 UNITS Caps capsule  Commonly  known as:  DRISDOL  Take 50,000 Units by mouth every Wednesday.         Allergies  Allergen Reactions  . Paxil [Paroxetine Hcl]     PMHx:   Past Medical History  Diagnosis Date  . COPD (chronic obstructive pulmonary disease)   . Hypertension   . Dyslipidemia   . Hard of hearing   . 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 Skin cancer     basal cell  . Hx of radiation therapy 06/18/13- 07/23/13    LUL lung primary, 7020 cGy 26 sessions  . Type II or unspecified type diabetes mellitus without mention of complication, not stated as uncontrolled     FHx:    Reviewed / unchanged  SHx:    Reviewed / unchanged  Systems Review: Constitutional: Denies fever, chills, wt changes, headaches, insomnia, fatigue, night sweats, change in appetite. Eyes: Denies redness, blurred vision, diplopia, discharge, itchy, watery eyes.   ENT: Denies discharge, congestion, post nasal drip, epistaxis, sore throat, earache, hearing loss, dental pain, tinnitus, vertigo, sinus pain, snoring.  CV: Denies chest pain, palpitations, irregular heartbeat, syncope, dyspnea, diaphoresis, orthopnea, PND, claudication, edema. Respiratory: denies cough, dyspnea, DOE, pleurisy, hoarseness, laryngitis, wheezing.  Gastrointestinal: Denies dysphagia, odynophagia, heartburn, reflux, water brash, abdominal pain or cramps, nausea, vomiting, bloating, diarrhea, constipation, hematemesis, melena, hematochezia,  or hemorrhoids. Genitourinary: Denies dysuria, frequency, urgency, nocturia, hesitancy, discharge, hematuria, flank pain. Musculoskeletal: Denies arthralgias, myalgias, stiffness, jt. swelling, pain, limp, strain/sprain.  Skin: Denies pruritus, rash, hives, warts, acne, eczema, change in skin lesion(s). Neuro: No weakness, tremor, incoordination, spasms, paresthesia, or pain. Psychiatric: Denies confusion, memory loss, or sensory loss. Endo: Denies change in weight, skin, hair change.  Heme/Lymph: No excessive bleeding, bruising, orenlarged lymph nodes.  BP: 122/70  Pulse: 88  Temp: 97.9 F (36.6 C)  Resp: 16    Estimated body mass index is 28 kg/(m^2) as calculated from the following:   Height as of 09/09/13: 5\' 4"  (1.626 m).   Weight as of this encounter: 163 lb 3.2 oz (74.027 kg).  On Exam: Appears well nourished - in no distress. Eyes: PERRLA, EOMs, conjunctiva no swelling or erythema. Sinuses: No frontal/maxillary tenderness ENT/Mouth: EAC's clear, TM's nl w/o erythema, bulging. Nares clear w/o erythema, swelling, exudates. Oropharynx clear without erythema or exudates. Oral hygiene is good. Tongue normal, non obstructing. Hearing intact.  Neck: Supple. Thyroid nl. Car 2+/2+ without bruits, nodes or JVD. Chest:  Increased AP diameter. O2 sats at 87 % on rm air. Respirations  with very distant BS and Bilat  rales, rhonchi and few  scattered wheezing and no stridor.  Cor: Heart sounds normal w/ regular rate and rhythm without sig. murmurs, gallops, clicks, or rubs. Peripheral pulses normal and equal  without edema.  Abdomen: Soft & bowel sounds normal. Non-tender w/o guarding, rebound, hernias, masses, or organomegaly.  Lymphatics: Unremarkable.  Musculoskeletal: Full ROM all peripheral extremities, joint stability, 5/5 strength, and normal gait.  Skin: Warm, dry without exposed rashes, lesions, ecchymosis apparent. Sequ[place]are of STSG Rt temple. Neuro: Cranial nerves intact, reflexes equal bilaterally. Sensory-motor testing grossly intact. Tendon reflexes grossly intact.  Pysch: Alert & oriented x 3. Insight and judgement nl & appropriate. No ideations.  Assessment and Plan:  1. Hypertension - Continue monitor blood pressure at home. Continue diet/meds same.  2. Hyperlipidemia - Continue diet/meds, exercise,& lifestyle modifications. Continue monitor periodic cholesterol/liver & renal functions  3. Pre-diabetes/Insulin Resistance - Continue diet, exercise, lifestyle modifications. Monitor appropriate labs.  4. COPD W/ AECB - Rx Levaquin, Prednisone pulse/taper  5. LUL Cancer Lung (05/2013)- post RadioTx for observation  Recommended regular exercise, BP monitoring, weight control, and discussed med and SE's. Recommended labs to assess and monitor clinical status. Further disposition pending results of labs.

## 2013-10-01 LAB — HEPATIC FUNCTION PANEL
ALBUMIN: 4.1 g/dL (ref 3.5–5.2)
ALK PHOS: 78 U/L (ref 39–117)
ALT: 15 U/L (ref 0–35)
AST: 20 U/L (ref 0–37)
BILIRUBIN TOTAL: 0.5 mg/dL (ref 0.2–1.2)
Bilirubin, Direct: 0.1 mg/dL (ref 0.0–0.3)
Indirect Bilirubin: 0.4 mg/dL (ref 0.2–1.2)
TOTAL PROTEIN: 6.4 g/dL (ref 6.0–8.3)

## 2013-10-01 LAB — BASIC METABOLIC PANEL WITH GFR
BUN: 13 mg/dL (ref 6–23)
CHLORIDE: 101 meq/L (ref 96–112)
CO2: 32 meq/L (ref 19–32)
CREATININE: 0.72 mg/dL (ref 0.50–1.10)
Calcium: 9.5 mg/dL (ref 8.4–10.5)
GFR, Est African American: >89 mL/min
GFR, Est Non African American: 82 mL/min
Glucose, Bld: 92 mg/dL (ref 70–99)
Potassium: 4.1 mEq/L (ref 3.5–5.3)
Sodium: 139 mEq/L (ref 135–145)

## 2013-10-01 LAB — LIPID PANEL
CHOL/HDL RATIO: 2.3 ratio
Cholesterol: 173 mg/dL (ref 0–200)
HDL: 74 mg/dL (ref >39)
LDL Cholesterol: 85 mg/dL (ref 0–99)
Triglycerides: 72 mg/dL (ref <150)
VLDL: 14 mg/dL (ref 0–40)

## 2013-10-01 LAB — INSULIN, FASTING: Insulin fasting, serum: 11 u[IU]/mL (ref 3–28)

## 2013-10-01 LAB — HEMOGLOBIN A1C
Hgb A1c MFr Bld: 5.7 % — ABNORMAL HIGH (ref <5.7)
Mean Plasma Glucose: 117 mg/dL — ABNORMAL HIGH (ref <117)

## 2013-10-01 LAB — TSH: TSH: 1.257 u[IU]/mL (ref 0.350–4.500)

## 2013-10-01 LAB — VITAMIN D 25 HYDROXY (VIT D DEFICIENCY, FRACTURES): Vit D, 25-Hydroxy: 61 ng/mL (ref 30–89)

## 2013-10-01 LAB — MAGNESIUM: MAGNESIUM: 2 mg/dL (ref 1.5–2.5)

## 2013-11-22 ENCOUNTER — Other Ambulatory Visit: Payer: Self-pay | Admitting: Internal Medicine

## 2013-11-24 ENCOUNTER — Ambulatory Visit (INDEPENDENT_AMBULATORY_CARE_PROVIDER_SITE_OTHER): Payer: Medicare Other | Admitting: Internal Medicine

## 2013-11-24 ENCOUNTER — Encounter: Payer: Self-pay | Admitting: Internal Medicine

## 2013-11-24 VITALS — BP 126/70 | HR 95 | Ht 64.25 in | Wt 158.2 lb

## 2013-11-24 DIAGNOSIS — C3490 Malignant neoplasm of unspecified part of unspecified bronchus or lung: Secondary | ICD-10-CM

## 2013-11-24 DIAGNOSIS — J449 Chronic obstructive pulmonary disease, unspecified: Secondary | ICD-10-CM

## 2013-11-24 DIAGNOSIS — F489 Nonpsychotic mental disorder, unspecified: Secondary | ICD-10-CM

## 2013-11-24 DIAGNOSIS — F5105 Insomnia due to other mental disorder: Secondary | ICD-10-CM

## 2013-11-24 DIAGNOSIS — F419 Anxiety disorder, unspecified: Secondary | ICD-10-CM

## 2013-11-24 DIAGNOSIS — F411 Generalized anxiety disorder: Secondary | ICD-10-CM

## 2013-11-24 MED ORDER — METHYLPREDNISOLONE ACETATE 80 MG/ML IJ SUSP
80.0000 mg | Freq: Once | INTRAMUSCULAR | Status: AC
  Administered 2013-11-24: 80 mg via INTRAMUSCULAR

## 2013-11-24 MED ORDER — AZITHROMYCIN 250 MG PO TABS: ORAL_TABLET | ORAL | Status: DC

## 2013-11-24 NOTE — Patient Instructions (Addendum)
Refill called in for Temazepam refill  Depo 80  Script sent for Zpak antibiotic  While on the Zpak, take your citalopram every other day, until the Zpak is finished

## 2013-11-24 NOTE — Progress Notes (Signed)
05/14/13- 75 yoF smoker referred courtesy of Dr Melford Aase; review PET scan to do biopsy of lung. Daughter here. Left upper lobe mass found on chest x-ray with routine physical. She has been a one pack per day smoker, now down to a few cigarettes every other day. Dyspnea on exertion less than one flight of stairs, less than one city block. Daily cough with clear mucus. Denies chest pain, blood, fever, swollen glands. Has gained 15 pounds eating ice cream. Recently changed from Advair to nebulizer with albuterol, plus Spiriva. History of pneumonia with sepsis in the past. Denies heart disease or phlebitis. Right facial surgery for a nonmelanoma skin cancer She had lived alone but is now staying with her daughter. Retired from [employer] work. A sister has survived lung cancer.  Office Spirometry 05/14/13-  Moderate restrictive disease based on exhaled volume. FVC 1.58/57%, FEV1 1.41/68%, FEV1/FVC 0.89  CT 04/28/13 IMPRESSION:  There is a spiculated mass in the left upper lobe. The lesion has  finger-like projections towards the periphery which could represent  small satellite lesions or lymphangitic involvement. Findings are  suspicious for a primary bronchogenic neoplasm. There are a few  prominent but nonspecific mediastinal lymph nodes. Recommend a PET-  CT for further characterization of the left upper lung mass and  mediastinal lymph nodes.  Centrilobular emphysema. Scattered interstitial thickening with  areas of nodularity in the lungs, particularly the right side.  This most likely represents postinflammatory change. There is also  a small amount of focal consolidation in the right middle lobe.  Extensive atherosclerotic disease, including the coronary arteries.  These results will be called to the ordering clinician or  representative by the Radiologist Assistant, and communication  documented in the PACS Dashboard.  Original Report Authenticated By: Markus Daft,  M.D. PET8/27/14 IMPRESSION:  1. Hypermetabolic right upper lobe pulmonary mass consistent  bronchogenic carcinoma. Hypermetabolic nodule superior to the mass  consistent with local extension of tumor.  2. No hypermetabolic mediastinal lymph nodes  3. Small focus of metabolic activity in the left lower lobe is  likely inflammatory. Recommend attention on follow-up.  4. Small pulmonology nodule in the left lower lobe measuring 5 mm  does not have metabolic activity. Recommend attention on follow-up.  5. Pleural-parenchymal thickening in the right upper lobe is not  hypermetabolic and likely post inflammatory or infectious.  Electronically Signed  By: Suzy Bouchard M.D.  On: 05/13/2013 12:36   05/25/13- 26 yoF smoker referred courtesy of Dr Melford Aase; LUL mass/ Squamous Cell Ca Needle bx POS Squamous Cell Ca Lung. To be discussed at [facility] in 3 days, and scheduled for [place]. She tolerated needle biopsy very well, without complication. Little change in cough, no chest pain, blood or swollen glands. Blames "nerves" for insomnia, which began before prednisone. Prednisone is now finished. Xanax did not help.  07/28/13- 78 yoF smoker followed for COPD complicated by squamous cell CA lung/ XRT, insomnia Daughter here. FOLLOWS FOR: has noticed more of a cough past few days-productive-thick. Would like to have refill for Restoril. No fever, sore throat or swollen glands. Continues followup with radiation therapy after treatment.  11/24/13- 78 yoF smoker followed for COPD complicated by squamous cell CA lung/ XRT, insomnia Daughter here. FOLLOWS FOR: more SOB than usual Coughing green x 2-3 days w/o F, sore throat.  Insomnia. CT chest 09/07/13 IMPRESSION:  1. Interval decrease and volume of left upper lobe bronchogenic  carcinoma which is now nodular size compared to mass size on prior.  2. Some improvement in the nodularity peripheral to the dominant  lesion.  3. Stable small additional  pulmonary nodules on the left.  4. Improvement pleural parenchymal thickening in the right middle  lobe.  Electronically Signed  By: Suzy Bouchard M.D.  On: 09/07/2013 16:44  ROS-see HPI Constitutional:   No-   weight loss, night sweats, fevers, chills, fatigue, lassitude. HEENT:   + headaches,no- difficulty swallowing, tooth/dental problems, sore throat,       No-  sneezing, itching, ear ache, nasal congestion, post nasal drip,  CV:  No-   chest pain, orthopnea, PND, swelling in lower extremities, anasarca,  dizziness, palpitations Resp: +shortness of breath with exertion or at rest.              +  productive cough,  No non-productive cough,  No- coughing up of blood.              + change in color of mucus.  + wheezing.   Skin: No-   rash or lesions. GI:  No-   heartburn, indigestion, abdominal pain, nausea, vomiting,  GU:  MS:  No-   joint pain or swelling.   Neuro-     nothing unusual Psych:  No- change in mood or affect. + depression or anxiety.  No memory loss.  OBJ- Physical Exam General- Alert, Oriented, Affect-appropriate, Distress- none acute, trying to be cheerful Skin- rash-none, lesions- none, excoriation- none Lymphadenopathy- none Head- +surgical defect right temple            Eyes- Gross vision intact, PERRLA, conjunctivae and secretions clear            Ears- +Deaf R, +hearing aid  L            Nose- Clear, no-Septal dev, mucus, polyps, erosion, perforation             Throat- Mallampati II , mucosa clear , drainage- none, tonsils- atrophic Neck- flexible , trachea midline, no stridor , thyroid nl, carotid no bruit Chest - symmetrical excursion , unlabored           Heart/CV- RRR , no murmur , no gallop  , no rub, nl s1 s2                           - JVD- none , edema- none, stasis changes- none, varices- none           Lung- +wheeze L>R, unlabored , dullness-none, rub- none           Chest wall-  Abd-  Br/ Gen/ Rectal- Not done, not indicated Extrem-  cyanosis- none, clubbing, none, atrophy- none, strength- nl Neuro- grossly intact to observation

## 2013-11-24 NOTE — Telephone Encounter (Signed)
Per CY okay to refill; called refill to [facility] with pharmacy dept for refill.

## 2013-12-08 ENCOUNTER — Ambulatory Visit (HOSPITAL_COMMUNITY)
Admission: RE | Admit: 2013-12-08 | Discharge: 2013-12-08 | Disposition: A | Discharge status: Home / Self Care | Payer: Medicare Other | Source: Ambulatory Visit | Attending: Internal Medicine | Admitting: Internal Medicine

## 2013-12-08 ENCOUNTER — Encounter (HOSPITAL_COMMUNITY): Payer: Self-pay

## 2013-12-08 ENCOUNTER — Other Ambulatory Visit (HOSPITAL_BASED_OUTPATIENT_CLINIC_OR_DEPARTMENT_OTHER): Payer: Medicare Other

## 2013-12-08 DIAGNOSIS — C3492 Malignant neoplasm of unspecified part of left bronchus or lung: Secondary | ICD-10-CM

## 2013-12-08 DIAGNOSIS — J4489 Other specified chronic obstructive pulmonary disease: Secondary | ICD-10-CM | POA: Insufficient documentation

## 2013-12-08 DIAGNOSIS — J449 Chronic obstructive pulmonary disease, unspecified: Secondary | ICD-10-CM | POA: Insufficient documentation

## 2013-12-08 DIAGNOSIS — C3410 Malignant neoplasm of upper lobe, unspecified bronchus or lung: Secondary | ICD-10-CM

## 2013-12-08 DIAGNOSIS — I2584 Coronary atherosclerosis due to calcified coronary lesion: Secondary | ICD-10-CM | POA: Insufficient documentation

## 2013-12-08 DIAGNOSIS — C3490 Malignant neoplasm of unspecified part of unspecified bronchus or lung: Secondary | ICD-10-CM | POA: Insufficient documentation

## 2013-12-08 DIAGNOSIS — R0602 Shortness of breath: Secondary | ICD-10-CM | POA: Insufficient documentation

## 2013-12-08 DIAGNOSIS — Z923 Personal history of irradiation: Secondary | ICD-10-CM | POA: Insufficient documentation

## 2013-12-08 LAB — COMPREHENSIVE METABOLIC PANEL (CC13)
ALK PHOS: 83 U/L (ref 40–150)
ALT: 9 U/L (ref 0–55)
ANION GAP: 10 meq/L (ref 3–11)
AST: 18 U/L (ref 5–34)
Albumin: 3.7 g/dL (ref 3.5–5.0)
BILIRUBIN TOTAL: 0.64 mg/dL (ref 0.20–1.20)
BUN: 10.9 mg/dL (ref 7.0–26.0)
CO2: 31 meq/L — AB (ref 22–29)
Calcium: 9.7 mg/dL (ref 8.4–10.4)
Chloride: 103 mEq/L (ref 98–109)
Creatinine: 0.8 mg/dL (ref 0.6–1.1)
Glucose: 114 mg/dl (ref 70–140)
Potassium: 4.1 mEq/L (ref 3.5–5.1)
SODIUM: 143 meq/L (ref 136–145)
TOTAL PROTEIN: 6.3 g/dL — AB (ref 6.4–8.3)

## 2013-12-08 LAB — CBC WITH DIFFERENTIAL/PLATELET
BASO%: 0.5 % (ref 0.0–2.0)
Basophils Absolute: 0 10*3/uL (ref 0.0–0.1)
EOS%: 2.1 % (ref 0.0–7.0)
Eosinophils Absolute: 0.2 10*3/uL (ref 0.0–0.5)
HEMATOCRIT: 42.3 % (ref 34.8–46.6)
HGB: 13.8 g/dL (ref 11.6–15.9)
LYMPH%: 13 % — AB (ref 14.0–49.7)
MCH: 30.2 pg (ref 25.1–34.0)
MCHC: 32.6 g/dL (ref 31.5–36.0)
MCV: 92.4 fL (ref 79.5–101.0)
MONO#: 0.9 10*3/uL (ref 0.1–0.9)
MONO%: 10.2 % (ref 0.0–14.0)
NEUT#: 6.3 10*3/uL (ref 1.5–6.5)
NEUT%: 74.2 % (ref 38.4–76.8)
PLATELETS: 189 10*3/uL (ref 145–400)
RBC: 4.58 10*6/uL (ref 3.70–5.45)
RDW: 13.6 % (ref 11.2–14.5)
WBC: 8.5 10*3/uL (ref 3.9–10.3)
lymph#: 1.1 10*3/uL (ref 0.9–3.3)

## 2013-12-08 MED ORDER — IOHEXOL 300 MG/ML  SOLN
80.0000 mL | Freq: Once | INTRAMUSCULAR | Status: AC | PRN
  Administered 2013-12-08: 80 mL via INTRAVENOUS

## 2013-12-09 ENCOUNTER — Encounter: Payer: Self-pay | Admitting: Internal Medicine

## 2013-12-09 ENCOUNTER — Ambulatory Visit (HOSPITAL_BASED_OUTPATIENT_CLINIC_OR_DEPARTMENT_OTHER): Payer: Medicare Other | Admitting: Internal Medicine

## 2013-12-09 VITALS — BP 136/60 | HR 90 | Temp 97.9°F | Resp 18 | Ht 64.25 in | Wt 150.2 lb

## 2013-12-09 DIAGNOSIS — C3410 Malignant neoplasm of upper lobe, unspecified bronchus or lung: Secondary | ICD-10-CM

## 2013-12-09 DIAGNOSIS — C3490 Malignant neoplasm of unspecified part of unspecified bronchus or lung: Secondary | ICD-10-CM

## 2013-12-09 NOTE — Progress Notes (Signed)
[place] [phone]   Fax:([phone]) [place], MD [address] [place] [address]  DIAGNOSIS: Stage IIB (T3, N0, M0) non-small cell lung cancer, squamous cell carcinoma of the left upper lung diagnosed in August of 2014.  PRIOR THERAPY: Curative radiotherapy to the left upper lobe lung mass under the care of Dr. Valere Dross completed on 07/23/2013.  CURRENT THERAPY: Observation.  INTERVAL HISTORY: Hailey Keith 79 y.o. female returns to the clinic today for followup visit accompanied by her daughter. She complains today about increasing fatigue and lack of energy as well as weight loss and shortness breath with exertion. She uses her home oxygen only with exertion. She denied having any significant chest pain but continues to have shortness of breath with exertion with no cough or hemoptysis. She denied having any fever or chills, no nausea or vomiting. She had repeat CT scan of the chest performed recently and she is here for evaluation and discussion of her scan results.  MEDICAL HISTORY: Past Medical History  Diagnosis Date  . COPD (chronic obstructive pulmonary disease)   . Hypertension   . Dyslipidemia   . Hard of hearing   . 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 Skin cancer     basal cell  . Hx of radiation therapy 06/18/13- 07/23/13    LUL lung primary, 7020 cGy 26 sessions  . Type II or unspecified type diabetes mellitus without mention of complication, not stated as uncontrolled     ALLERGIES:  is allergic to paxil.  MEDICATIONS:  Current Outpatient Prescriptions  Medication Sig Dispense Refill  . ALPRAZolam (XANAX) 0.5 MG tablet Take 0.5 mg by mouth 3 (three) times daily as needed. For anxiety      . aspirin 81 MG tablet Take 81 mg by mouth daily.      Marland Kitchen atorvastatin (LIPITOR) 80 MG tablet Take 80 mg by mouth. On  Sunday and Wednesday      . azithromycin (ZITHROMAX Z-PAK) 250 MG tablet 2 today then one daily  6 each  0  . citalopram (CELEXA) 40 MG tablet Take 40 mg by mouth every morning.       Marland Kitchen doxazosin (CARDURA) 8 MG tablet Take 1 tablet (8 mg total) by mouth at bedtime.  90 tablet  0  . Fluticasone-Salmeterol (ADVAIR) 500-50 MCG/DOSE AEPB Inhale 1 puff into the lungs every 12 (twelve) hours.      Marland Kitchen guaifenesin (HUMIBID E) 400 MG TABS tablet Take 400 mg by mouth 2 (two) times daily.      Marland Kitchen HYDROcodone-acetaminophen (NORCO) 5-325 MG per tablet 1/2 to 1 tablet every 3 to 4 hours as needed for pain or cough  50 tablet  0  . losartan (COZAAR) 100 MG tablet Take 100 mg by mouth at bedtime.       . temazepam (RESTORIL) 15 MG capsule TAKE 1 OR 2 CASPULES BY MOUTH FOR SLEEP IF NEEDED  30 capsule  5  . tiotropium (SPIRIVA) 18 MCG inhalation capsule Place 18 mcg into inhaler and inhale daily.      . Vitamin D, Ergocalciferol, (DRISDOL) 50000 UNITS CAPS capsule Take 50,000 Units by mouth every Wednesday.        No current facility-administered medications for this visit.    SURGICAL HISTORY:  Past Surgical History  Procedure Laterality Date  . Tonsillectomy  as child, repeat surgery early 20's  . Mohs surgery      "several times"  . Facial reconstruction surgery  25 years ago    to remove skin cancer, right side of face    REVIEW OF SYSTEMS:  A comprehensive review of systems was negative except for: Respiratory: positive for dyspnea on exertion   PHYSICAL EXAMINATION: General appearance: alert, cooperative and no distress Head: Normocephalic, without obvious abnormality, atraumatic Neck: no adenopathy, no JVD, supple, symmetrical, trachea midline and thyroid not enlarged, symmetric, no tenderness/mass/nodules Lymph nodes: Cervical, supraclavicular, and axillary nodes normal. Resp: clear to auscultation bilaterally Back: symmetric, no curvature. ROM normal. No CVA tenderness. Cardio: regular  rate and rhythm, S1, S2 normal, no murmur, click, rub or gallop GI: soft, non-tender; bowel sounds normal; no masses,  no organomegaly Extremities: extremities normal, atraumatic, no cyanosis or edema  ECOG PERFORMANCE STATUS: 2 - Symptomatic, <50% confined to bed  Blood pressure 136/60, pulse 90, temperature 97.9 F (36.6 C), temperature source Oral, resp. rate 18, height 5' 4.25" (1.632 m), weight 150 lb 3.2 oz (68.13 kg).  LABORATORY DATA: Lab Results  Component Value Date   WBC 8.5 12/08/2013   HGB 13.8 12/08/2013   HCT 42.3 12/08/2013   MCV 92.4 12/08/2013   PLT 189 12/08/2013      Chemistry      Component Value Date/Time   NA 143 12/08/2013 0850   NA 139 09/30/2013 1658   K 4.1 12/08/2013 0850   K 4.1 09/30/2013 1658   CL 101 09/30/2013 1658   CO2 31* 12/08/2013 0850   CO2 32 09/30/2013 1658   BUN 10.9 12/08/2013 0850   BUN 13 09/30/2013 1658   CREATININE 0.8 12/08/2013 0850   CREATININE 0.72 09/30/2013 1658   CREATININE 0.7 05/14/2013 1717      Component Value Date/Time   CALCIUM 9.7 12/08/2013 0850   CALCIUM 9.5 09/30/2013 1658   ALKPHOS 83 12/08/2013 0850   ALKPHOS 78 09/30/2013 1658   AST 18 12/08/2013 0850   AST 20 09/30/2013 1658   ALT 9 12/08/2013 0850   ALT 15 09/30/2013 1658   BILITOT 0.64 12/08/2013 0850   BILITOT 0.5 09/30/2013 1658       RADIOGRAPHIC STUDIES: Ct Chest W Contrast  12/08/2013   CLINICAL DATA:  Restaging lung cancer. Prior radiation. Shortness of breath.  EXAM: CT CHEST WITH CONTRAST  TECHNIQUE: Multidetector CT imaging of the chest was performed during intravenous contrast administration.  CONTRAST:  77mL OMNIPAQUE IOHEXOL 300 MG/ML  SOLN  COMPARISON:  CT CHEST W/CM dated 09/07/2013  FINDINGS: Left upper lobe spiculated nodule has decreased in size since prior study, measuring 2.0 x 0.8 cm on image 20 compared with 2.4 x 1.1 cm previously. Nodularity peripheral to the lesion is also stable or slightly decreased in volume. 5 mm left upper lobe nodule on image 29 is stable.  Slightly ill-defined left lower lobe nodule on image 37 measures 5 mm, stable.  Moderate centrilobular emphysema changes. Scarring in the right middle lobe is stable. Early fibrotic changes noted posteriorly in the lungs, stable. No pleural effusions.  Heart is normal size. Aorta is normal caliber. Densely calcified coronary arteries and mitral valve. Diffuse calcifications throughout the aortic arch and descending thoracic aorta.  No mediastinal, hilar, or axillary adenopathy. Chest wall soft tissues are unremarkable. Trace pericardial effusion is stable.  Imaging into the upper abdomen shows no acute findings.  No acute or suspicious focal bony abnormality.  IMPRESSION: Further slight interval  decrease in the size of the left upper lobe spiculated nodule.  Other smaller left lung nodules are stable.  Stable COPD/chronic changes.  Severe coronary artery calcifications.   Electronically Signed   By: Rolm Baptise M.D.   On: 12/08/2013 11:51   ASSESSMENT AND PLAN: This is a very pleasant 79 years old white female with history of stage IIB non-small cell lung cancer, squamous cell carcinoma status post curative radiotherapy to the left upper lobe lung mass with partial response. Her recent scan showed further decrease in the size of the left upper lobe nodule. I discussed the scan results with the patient and her daughter.  I recommended for her to continue on observation for now with repeat CT scan of the chest in 3 months. She was advised to call immediately if she has any concerning symptoms in the interval. The patient voices understanding of current disease status and treatment options and is in agreement with the current care plan.  All questions were answered. The patient knows to call the clinic with any problems, questions or concerns. We can certainly see the patient much sooner if necessary.  Disclaimer: This note was dictated with voice recognition software. Similar sounding words can  inadvertently be transcribed and may not be corrected upon review.

## 2013-12-09 NOTE — Patient Instructions (Signed)
Smoking Cessation, Tips for Success If you are ready to quit smoking, congratulations! You have chosen to help yourself be healthier. Cigarettes bring nicotine, tar, carbon monoxide, and other irritants into your body. Your lungs, heart, and blood vessels will be able to work better without these poisons. There are many different ways to quit smoking. Nicotine gum, nicotine patches, a nicotine inhaler, or nicotine nasal spray can help with physical craving. Hypnosis, support groups, and medicines help break the habit of smoking. WHAT THINGS CAN I DO TO MAKE QUITTING EASIER?  Here are some tips to help you quit for good:  Pick a date when you will quit smoking completely. Tell all of your friends and family about your plan to quit on that date.  Do not try to slowly cut down on the number of cigarettes you are smoking. Pick a quit date and quit smoking completely starting on that day.  Throw away all cigarettes.   Clean and remove all ashtrays from your home, work, and car.   On a card, write down your reasons for quitting. Carry the card with you and read it when you get the urge to smoke.   Cleanse your body of nicotine. Drink enough water and fluids to keep your urine clear or pale yellow. Do this after quitting to flush the nicotine from your body.   Learn to predict your moods. Do not let a bad situation be your excuse to have a cigarette. Some situations in your life might tempt you into wanting a cigarette.   Never have "just one" cigarette. It leads to wanting another and another. Remind yourself of your decision to quit.   Change habits associated with smoking. If you smoked while driving or when feeling stressed, try other activities to replace smoking. Stand up when drinking your coffee. Brush your teeth after eating. Sit in a different chair when you read the paper. Avoid alcohol while trying to quit, and try to drink fewer caffeinated beverages. Alcohol and caffeine may urge  you to smoke.   Avoid foods and drinks that can trigger a desire to smoke, such as sugary or spicy foods and alcohol.   Ask people who smoke not to smoke around you.   Have something planned to do right after eating or having a cup of coffee. For example, plan to take a walk or exercise.   Try a relaxation exercise to calm you down and decrease your stress. Remember, you may be tense and nervous for the first 2 weeks after you quit, but this will pass.   Find new activities to keep your hands busy. Play with a pen, coin, or rubber band. Doodle or draw things on paper.   Brush your teeth right after eating. This will help cut down on the craving for the taste of tobacco after meals. You can also try mouthwash.   Use oral substitutes in place of cigarettes. Try using lemon drops, carrots, cinnamon sticks, or chewing gum. Keep them handy so they are available when you have the urge to smoke.   When you have the urge to smoke, try deep breathing.   Designate your home as a nonsmoking area.   If you are a heavy smoker, ask your health care provider about a prescription for nicotine chewing gum. It can ease your withdrawal from nicotine.   Reward yourself. Set aside the cigarette money you save and buy yourself something nice.   Look for support from others. Join a support group or   smoking cessation program. Ask someone at home or at work to help you with your plan to quit smoking.   Always ask yourself, "Do I need this cigarette or is this just a reflex?" Tell yourself, "Today, I choose not to smoke," or "I do not want to smoke." You are reminding yourself of your decision to quit.  Do not replace cigarette smoking with electronic cigarettes (commonly called e-cigarettes). The safety of e-cigarettes is unknown, and some may contain harmful chemicals.  If you relapse, do not give up! Plan ahead and think about what you will do the next time you get the urge to smoke.  HOW WILL  I FEEL WHEN I QUIT SMOKING? You may have symptoms of withdrawal because your body is used to nicotine (the addictive substance in cigarettes). You may crave cigarettes, be irritable, feel very hungry, cough often, get headaches, or have difficulty concentrating. The withdrawal symptoms are only temporary. They are strongest when you first quit but will go away within 10 14 days. When withdrawal symptoms occur, stay in control. Think about your reasons for quitting. Remind yourself that these are signs that your body is healing and getting used to being without cigarettes. Remember that withdrawal symptoms are easier to treat than the major diseases that smoking can cause.  Even after the withdrawal is over, expect periodic urges to smoke. However, these cravings are generally short lived and will go away whether you smoke or not. Do not smoke!  WHAT RESOURCES ARE AVAILABLE TO HELP ME QUIT SMOKING? Your health care provider can direct you to community resources or hospitals for support, which may include:  Group support.  Education.  Hypnosis.  Therapy. Document Released: 05/18/2004 Document Revised: 06/10/2013 Document Reviewed: 02/05/2013 [employer] Patient Information 2014 [employer].  

## 2013-12-15 NOTE — Assessment & Plan Note (Addendum)
Acute bronchitic exacerbation Plan- Depomedrol, Z pak (adjust citalopram)

## 2013-12-15 NOTE — Assessment & Plan Note (Signed)
Managed by Oncology

## 2013-12-15 NOTE — Assessment & Plan Note (Signed)
Sleep hygiene discussed. Plan- temazepam

## 2013-12-17 ENCOUNTER — Other Ambulatory Visit: Payer: Self-pay | Admitting: *Deleted

## 2013-12-17 DIAGNOSIS — I10 Essential (primary) hypertension: Secondary | ICD-10-CM

## 2013-12-17 MED ORDER — DOXAZOSIN MESYLATE 8 MG PO TABS: 8.0000 mg | ORAL_TABLET | Freq: Every day | ORAL | Status: DC

## 2013-12-17 MED ORDER — TIOTROPIUM BROMIDE MONOHYDRATE 18 MCG IN CAPS: 18.0000 ug | ORAL_CAPSULE | Freq: Every day | RESPIRATORY_TRACT | Status: DC

## 2013-12-21 ENCOUNTER — Other Ambulatory Visit: Payer: Self-pay | Admitting: *Deleted

## 2013-12-21 MED ORDER — TIOTROPIUM BROMIDE MONOHYDRATE 18 MCG IN CAPS: 18.0000 ug | ORAL_CAPSULE | Freq: Every day | RESPIRATORY_TRACT | Status: DC

## 2013-12-28 ENCOUNTER — Telehealth: Payer: Self-pay | Admitting: *Deleted

## 2013-12-28 ENCOUNTER — Other Ambulatory Visit: Payer: Self-pay | Admitting: Emergency Medicine

## 2013-12-28 MED ORDER — FLUTICASONE-SALMETEROL 500-50 MCG/DOSE IN AEPB: 1.0000 | INHALATION_SPRAY | Freq: Two times a day (BID) | RESPIRATORY_TRACT | Status: DC

## 2013-12-28 NOTE — Telephone Encounter (Signed)
rx for adviar 50/500mg     [facility]  [facility] [address]

## 2013-12-29 ENCOUNTER — Ambulatory Visit: Payer: Self-pay | Admitting: Emergency Medicine

## 2014-01-19 ENCOUNTER — Ambulatory Visit (INDEPENDENT_AMBULATORY_CARE_PROVIDER_SITE_OTHER): Payer: Medicare Other | Admitting: Emergency Medicine

## 2014-01-19 ENCOUNTER — Encounter: Payer: Self-pay | Admitting: Emergency Medicine

## 2014-01-19 VITALS — BP 130/64 | HR 100 | Temp 98.4°F | Resp 16 | Ht 64.0 in | Wt 142.0 lb

## 2014-01-19 DIAGNOSIS — IMO0002 Reserved for concepts with insufficient information to code with codable children: Secondary | ICD-10-CM

## 2014-01-19 DIAGNOSIS — R5381 Other malaise: Secondary | ICD-10-CM

## 2014-01-19 DIAGNOSIS — R5383 Other fatigue: Principal | ICD-10-CM

## 2014-01-19 DIAGNOSIS — R634 Abnormal weight loss: Secondary | ICD-10-CM

## 2014-01-19 DIAGNOSIS — E782 Mixed hyperlipidemia: Secondary | ICD-10-CM

## 2014-01-19 DIAGNOSIS — T148XXA Other injury of unspecified body region, initial encounter: Secondary | ICD-10-CM

## 2014-01-19 LAB — CBC WITH DIFFERENTIAL/PLATELET
BASOS ABS: 0 10*3/uL (ref 0.0–0.1)
Basophils Relative: 0 % (ref 0–1)
Eosinophils Absolute: 0.2 10*3/uL (ref 0.0–0.7)
Eosinophils Relative: 2 % (ref 0–5)
HEMATOCRIT: 41.6 % (ref 36.0–46.0)
HEMOGLOBIN: 13.9 g/dL (ref 12.0–15.0)
LYMPHS ABS: 1 10*3/uL (ref 0.7–4.0)
LYMPHS PCT: 11 % — AB (ref 12–46)
MCH: 30.3 pg (ref 26.0–34.0)
MCHC: 33.4 g/dL (ref 30.0–36.0)
MCV: 90.6 fL (ref 78.0–100.0)
MONO ABS: 0.8 10*3/uL (ref 0.1–1.0)
Monocytes Relative: 9 % (ref 3–12)
NEUTROS ABS: 7.1 10*3/uL (ref 1.7–7.7)
Neutrophils Relative %: 78 % — ABNORMAL HIGH (ref 43–77)
Platelets: 237 10*3/uL (ref 150–400)
RBC: 4.59 MIL/uL (ref 3.87–5.11)
RDW: 14.6 % (ref 11.5–15.5)
WBC: 9.1 10*3/uL (ref 4.0–10.5)

## 2014-01-19 LAB — LIPID PANEL
Cholesterol: 151 mg/dL (ref 0–200)
HDL: 61 mg/dL (ref >39)
LDL CALC: 76 mg/dL (ref 0–99)
Total CHOL/HDL Ratio: 2.5 Ratio
Triglycerides: 72 mg/dL (ref <150)
VLDL: 14 mg/dL (ref 0–40)

## 2014-01-19 LAB — BASIC METABOLIC PANEL WITH GFR
BUN: 12 mg/dL (ref 6–23)
CHLORIDE: 102 meq/L (ref 96–112)
CO2: 34 mEq/L — ABNORMAL HIGH (ref 19–32)
CREATININE: 1.27 mg/dL — AB (ref 0.50–1.10)
Calcium: 10.2 mg/dL (ref 8.4–10.5)
GFR, Est African American: 47 mL/min — ABNORMAL LOW
GFR, Est Non African American: 41 mL/min — ABNORMAL LOW
Glucose, Bld: 101 mg/dL — ABNORMAL HIGH (ref 70–99)
POTASSIUM: 4.4 meq/L (ref 3.5–5.3)
Sodium: 145 mEq/L (ref 135–145)

## 2014-01-19 LAB — HEPATIC FUNCTION PANEL
ALBUMIN: 3.8 g/dL (ref 3.5–5.2)
ALT: 13 U/L (ref 0–35)
AST: 22 U/L (ref 0–37)
Alkaline Phosphatase: 85 U/L (ref 39–117)
Bilirubin, Direct: 0.2 mg/dL (ref 0.0–0.3)
Indirect Bilirubin: 0.4 mg/dL (ref 0.2–1.2)
Total Bilirubin: 0.6 mg/dL (ref 0.2–1.2)
Total Protein: 6.2 g/dL (ref 6.0–8.3)

## 2014-01-19 LAB — TSH: TSH: 1.524 u[IU]/mL (ref 0.350–4.500)

## 2014-01-19 MED ORDER — DOXYCYCLINE HYCLATE 100 MG PO TABS: 100.0000 mg | ORAL_TABLET | Freq: Two times a day (BID) | ORAL | Status: DC

## 2014-01-19 NOTE — Progress Notes (Signed)
Subjective:    Patient ID: Hailey Keith, female    DOB: [birth year], 79 y.o.   MRN: [id number]  HPI Comments: 79 yo pleasant WF needs cholesterol rechecked. She is down 21 # since January. She has had decreased appetite. She is under Oncology treatment for lung Cancer. Last radiation treatment 4 months ago. She has not been exercising due to fatigue. She fell last night because she tripped over oxygen cord. She denies any other fall hx. Daughter notes she did not hit her head or loss consciousness. She fell into door frame and cut her left upper arm. Daughter cleaned wound and attempted to place skin back where it belonged. She denies any bone pain or difficulty moving her arm.   Fall     Medication List       This list is accurate as of: 01/19/14 11:12 AM.  Always use your most recent med list.               ALPRAZolam 0.5 MG tablet  Commonly known as:  XANAX  Take 0.5 mg by mouth 3 (three) times daily as needed. For anxiety     aspirin 81 MG tablet  Take 81 mg by mouth daily.     atorvastatin 80 MG tablet  Commonly known as:  LIPITOR  Take 80 mg by mouth. On Sunday and Wednesday     azithromycin 250 MG tablet  Commonly known as:  ZITHROMAX Z-PAK  2 today then one daily     citalopram 40 MG tablet  Commonly known as:  CELEXA  Take 40 mg by mouth every morning.     doxazosin 8 MG tablet  Commonly known as:  CARDURA  Take 1 tablet (8 mg total) by mouth at bedtime.     Fluticasone-Salmeterol 500-50 MCG/DOSE Aepb  Commonly known as:  ADVAIR  Inhale 1 puff into the lungs every 12 (twelve) hours.     guaifenesin 400 MG Tabs tablet  Commonly known as:  HUMIBID E  Take 400 mg by mouth 2 (two) times daily.     HYDROcodone-acetaminophen 5-325 MG per tablet  Commonly known as:  NORCO  1/2 to 1 tablet every 3 to 4 hours as needed for pain or cough     losartan 100 MG tablet  Commonly known as:  COZAAR  Take 100 mg by mouth at bedtime.     temazepam 15 MG capsule    Commonly known as:  RESTORIL  TAKE 1 OR 2 CASPULES BY MOUTH FOR SLEEP IF NEEDED     tiotropium 18 MCG inhalation capsule  Commonly known as:  SPIRIVA  Place 1 capsule (18 mcg total) into inhaler and inhale daily.     Vitamin D (Ergocalciferol) 50000 UNITS Caps capsule  Commonly known as:  DRISDOL  Take 50,000 Units by mouth every Wednesday.       Allergies  Allergen Reactions  . Paxil [Paroxetine Hcl]    Past Medical History  Diagnosis Date  . COPD (chronic obstructive pulmonary disease)   . Hypertension   . Dyslipidemia   . Hard of hearing   . 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 Skin cancer     basal cell  . Hx of radiation therapy 06/18/13- 07/23/13    LUL lung primary, 7020 cGy 26 sessions  . Type II or unspecified type diabetes mellitus without mention of complication, not stated as uncontrolled  Review of Systems  Constitutional: Positive for appetite change, fatigue and unexpected weight change.  Skin: Positive for wound.  All other systems reviewed and are negative.  BP 130/64  Pulse 100  Temp(Src) 98.4 F (36.9 C) (Temporal)  Resp 16  Ht 5\' 4"  (1.626 m)  Wt 142 lb (64.411 kg)  BMI 24.36 kg/m2  SpO2 88%     Objective:   Physical Exam  Nursing note and vitals reviewed. Constitutional: She is oriented to person, place, and time. She appears well-developed and well-nourished. No distress.  HENT:  Head: Normocephalic and atraumatic.  Right Ear: External ear normal.  Left Ear: External ear normal.  Nose: Nose normal.  Mouth/Throat: Oropharynx is clear and moist.  Eyes: Conjunctivae and EOM are normal.  Neck: Normal range of motion. Neck supple. No JVD present. No thyromegaly present.  Cardiovascular: Normal rate, regular rhythm, normal heart sounds and intact distal pulses.   Pulmonary/Chest: Effort normal and breath sounds normal.  Abdominal: Soft. Bowel sounds are normal. She  exhibits no distension and no mass. There is no tenderness. There is no rebound and no guarding.  Musculoskeletal: Normal range of motion. She exhibits no edema and no tenderness.       Arms: Lymphadenopathy:    She has no cervical adenopathy.  Neurological: She is alert and oriented to person, place, and time. No cranial nerve deficit.  Skin: Skin is warm and dry. No rash noted. No erythema. No pallor.  left proximal forearm nickel size ? Scaling/ sq cell concern, Left upper arm v shaped skin flap tear superficial re- approximated with out signs of infection. Approximately 1 inch tear on both sides  Psychiatric: She has a normal mood and affect. Her behavior is normal. Judgment and thought content normal.          Assessment & Plan:  1. Skin laceration from fall- Wound hygiene discussed, Doxy 100 mg AD prophylaxis with recent Lung cancer/ radiation. Recheck Friday  2. Weight loss/ Fatigue vs decreased appetite with Lung CA- check labs, increase activity and H2O   3. 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  4. Cholesterol- recheck labs, Need to eat healthier and exercise AD.

## 2014-01-19 NOTE — Patient Instructions (Signed)

## 2014-01-22 ENCOUNTER — Ambulatory Visit (INDEPENDENT_AMBULATORY_CARE_PROVIDER_SITE_OTHER): Payer: Medicare Other | Admitting: Emergency Medicine

## 2014-01-22 ENCOUNTER — Encounter: Payer: Self-pay | Admitting: Emergency Medicine

## 2014-01-22 VITALS — BP 124/62 | HR 86 | Temp 98.0°F | Resp 18 | Ht 64.0 in | Wt 147.0 lb

## 2014-01-22 DIAGNOSIS — R239 Unspecified skin changes: Secondary | ICD-10-CM

## 2014-01-22 DIAGNOSIS — T148XXA Other injury of unspecified body region, initial encounter: Secondary | ICD-10-CM

## 2014-01-22 DIAGNOSIS — IMO0002 Reserved for concepts with insufficient information to code with codable children: Secondary | ICD-10-CM

## 2014-01-22 DIAGNOSIS — R238 Other skin changes: Secondary | ICD-10-CM

## 2014-01-22 NOTE — Progress Notes (Addendum)
Subjective:    Patient ID: Hailey Keith, female    DOB: 1934/11/25, 79 y.o.   MRN: [id number]  HPI Comments: 79 yo WF for wound recheck from fall on 01/18/14. She has been doing well with DOXY and denies any SE or signs of infection. She was unable to get in with dermatology AD for skin recheck with ? SCC on left forearm.     Medication List       This list is accurate as of: 01/22/14  9:55 AM.  Always use your most recent med list.               ALPRAZolam 0.5 MG tablet  Commonly known as:  XANAX  Take 0.5 mg by mouth 3 (three) times daily as needed. For anxiety     aspirin 81 MG tablet  Take 81 mg by mouth daily.     atorvastatin 80 MG tablet  Commonly known as:  LIPITOR  Take 80 mg by mouth. On Sunday and Wednesday     azithromycin 250 MG tablet  Commonly known as:  ZITHROMAX Z-PAK  2 today then one daily     citalopram 40 MG tablet  Commonly known as:  CELEXA  Take 40 mg by mouth every morning.     doxazosin 8 MG tablet  Commonly known as:  CARDURA  Take 1 tablet (8 mg total) by mouth at bedtime.     doxycycline 100 MG tablet  Commonly known as:  VIBRA-TABS  Take 1 tablet (100 mg total) by mouth 2 (two) times daily.     Fluticasone-Salmeterol 500-50 MCG/DOSE Aepb  Commonly known as:  ADVAIR  Inhale 1 puff into the lungs every 12 (twelve) hours.     guaifenesin 400 MG Tabs tablet  Commonly known as:  HUMIBID E  Take 400 mg by mouth 2 (two) times daily.     HYDROcodone-acetaminophen 5-325 MG per tablet  Commonly known as:  NORCO  1/2 to 1 tablet every 3 to 4 hours as needed for pain or cough     losartan 100 MG tablet  Commonly known as:  COZAAR  Take 100 mg by mouth at bedtime.     temazepam 15 MG capsule  Commonly known as:  RESTORIL  TAKE 1 OR 2 CASPULES BY MOUTH FOR SLEEP IF NEEDED     tiotropium 18 MCG inhalation capsule  Commonly known as:  SPIRIVA  Place 1 capsule (18 mcg total) into inhaler and inhale daily.     Vitamin D  (Ergocalciferol) 50000 UNITS Caps capsule  Commonly known as:  DRISDOL  Take 50,000 Units by mouth every Wednesday.       Allergies  Allergen Reactions  . Paxil [Paroxetine Hcl]    Past Medical History  Diagnosis Date  . COPD (chronic obstructive pulmonary disease)   . Hypertension   . Dyslipidemia   . Hard of hearing   . 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 Skin cancer     basal cell  . Hx of radiation therapy 06/18/13- 07/23/13    LUL lung primary, 7020 cGy 26 sessions  . Type II or unspecified type diabetes mellitus without mention of complication, not stated as uncontrolled       Review of Systems  Skin: Positive for wound.  All other systems reviewed and are negative.  BP 124/62  Pulse 86  Temp(Src) 98 F (36.7 C) (Temporal)  Resp 18  Ht 5\' 4"  (1.626 m)  Wt 147 lb (66.679 kg)  BMI 25.22 kg/m2  SpO2 91%     Objective:   Physical Exam  Nursing note and vitals reviewed. Constitutional: She is oriented to person, place, and time. She appears well-developed and well-nourished.  HENT:  Head: Normocephalic and atraumatic.  Right Ear: External ear normal.  Left Ear: External ear normal.  Nose: Nose normal.  Mouth/Throat: Oropharynx is clear and moist.  Eyes: Conjunctivae and EOM are normal.  Neck: Normal range of motion.  Musculoskeletal: Normal range of motion.  Lymphadenopathy:    She has no cervical adenopathy.  Neurological: She is alert and oriented to person, place, and time.  Skin: Skin is warm and dry.  NO Change from previous visit, see 01/19/14 OV  Psychiatric: She has a normal mood and affect. Judgment normal.          Assessment & Plan:  1. Laceration recheck- Removed tegaderm, cleaned wound with alcohol, Replaced with new tegaderm- advised continue ABX AD and call with any concerns for infection  2. ? Squamous cell carcinoma left forearm- refer DERM

## 2014-03-10 ENCOUNTER — Other Ambulatory Visit: Payer: Self-pay | Admitting: Internal Medicine

## 2014-03-23 ENCOUNTER — Ambulatory Visit: Payer: Medicare Other | Admitting: Internal Medicine

## 2014-03-25 ENCOUNTER — Other Ambulatory Visit: Payer: Self-pay | Admitting: Internal Medicine

## 2014-03-31 ENCOUNTER — Ambulatory Visit (INDEPENDENT_AMBULATORY_CARE_PROVIDER_SITE_OTHER): Payer: Medicare Other | Admitting: Internal Medicine

## 2014-03-31 ENCOUNTER — Encounter: Payer: Self-pay | Admitting: Internal Medicine

## 2014-03-31 VITALS — BP 136/58 | HR 100 | Temp 97.9°F | Resp 24 | Ht 64.0 in | Wt 145.6 lb

## 2014-03-31 DIAGNOSIS — E785 Hyperlipidemia, unspecified: Secondary | ICD-10-CM

## 2014-03-31 DIAGNOSIS — Z0000 Encounter for general adult medical examination without abnormal findings: Secondary | ICD-10-CM

## 2014-03-31 DIAGNOSIS — Z1331 Encounter for screening for depression: Secondary | ICD-10-CM

## 2014-03-31 DIAGNOSIS — I10 Essential (primary) hypertension: Secondary | ICD-10-CM

## 2014-03-31 DIAGNOSIS — Z79899 Other long term (current) drug therapy: Secondary | ICD-10-CM

## 2014-03-31 DIAGNOSIS — Z789 Other specified health status: Secondary | ICD-10-CM

## 2014-03-31 DIAGNOSIS — E559 Vitamin D deficiency, unspecified: Secondary | ICD-10-CM

## 2014-03-31 DIAGNOSIS — R7309 Other abnormal glucose: Secondary | ICD-10-CM

## 2014-03-31 DIAGNOSIS — Z1212 Encounter for screening for malignant neoplasm of rectum: Secondary | ICD-10-CM

## 2014-03-31 LAB — CBC WITH DIFFERENTIAL/PLATELET
Basophils Absolute: 0 10*3/uL (ref 0.0–0.1)
Basophils Relative: 0 % (ref 0–1)
EOS ABS: 0.1 10*3/uL (ref 0.0–0.7)
Eosinophils Relative: 1 % (ref 0–5)
HCT: 43.3 % (ref 36.0–46.0)
HEMOGLOBIN: 14.3 g/dL (ref 12.0–15.0)
Lymphocytes Relative: 12 % (ref 12–46)
Lymphs Abs: 1.2 10*3/uL (ref 0.7–4.0)
MCH: 29.8 pg (ref 26.0–34.0)
MCHC: 33 g/dL (ref 30.0–36.0)
MCV: 90.2 fL (ref 78.0–100.0)
MONOS PCT: 10 % (ref 3–12)
Monocytes Absolute: 1 10*3/uL (ref 0.1–1.0)
NEUTROS PCT: 77 % (ref 43–77)
Neutro Abs: 7.4 10*3/uL (ref 1.7–7.7)
PLATELETS: 258 10*3/uL (ref 150–400)
RBC: 4.8 MIL/uL (ref 3.87–5.11)
RDW: 13.8 % (ref 11.5–15.5)
WBC: 9.6 10*3/uL (ref 4.0–10.5)

## 2014-03-31 LAB — HEMOGLOBIN A1C
HEMOGLOBIN A1C: 5.7 % — AB (ref <5.7)
MEAN PLASMA GLUCOSE: 117 mg/dL — AB (ref <117)

## 2014-03-31 MED ORDER — ALBUTEROL SULFATE HFA 108 (90 BASE) MCG/ACT IN AERS: INHALATION_SPRAY | RESPIRATORY_TRACT | Status: DC

## 2014-03-31 MED ORDER — IPRATROPIUM-ALBUTEROL 0.5-2.5 (3) MG/3ML IN SOLN: 3.0000 mL | Freq: Four times a day (QID) | RESPIRATORY_TRACT | Status: DC

## 2014-03-31 NOTE — Patient Instructions (Signed)

## 2014-03-31 NOTE — Addendum Note (Signed)
Addended by: Unk Pinto on: 03/31/2014 06:19 PM   Modules accepted: Orders, Medications

## 2014-03-31 NOTE — Progress Notes (Deleted)
Patient ID: Hailey Keith, female   DOB: July 15, 1935, 79 y.o.   MRN: [id number]   Annual Screening Comprehensive Examination  This very nice 79 y.o.female presents for complete physical.  Patient has been followed for HTN, Diabetes  Prediabetes, Hyperlipidemia, and Vitamin D Deficiency.    HTN predates since     . Patient's BP has been controlled at home. Today's  . Patient denies any cardiac symptoms as chest pain, palpitations, shortness of breath, dizziness or ankle swelling.   Patient's hyperlipidemia is controlled with diet and medications. Patient denies myalgias or other medication SE's. Last lipids were 01/19/2014: Cholesterol, Total 151; HDL Cholesterol by NMR 61; LDL (calc) 76; Triglycerides 72   Patient has prediabetes predating since      and last A1c was 09/30/2013: Hemoglobin-A1c 5.7* . Patient denies reactive hypoglycemic symptoms, visual blurring, diabetic polys, or paresthesias.    Patient has Diabetes and last A1c was 09/30/2013: Hemoglobin-A1c 5.7* .  Patient denies reactive hypoglycemic symptoms, visual blurring, diabetic polys, or paresthesias.   Finally, patient has history of Vitamin D Deficiency and last Vitamin D was 09/30/2013: Vit D, 25-Hydroxy 61 . Current Outpatient Prescriptions on File Prior to Visit  Medication Sig Dispense Refill  . ALPRAZolam (XANAX) 0.5 MG tablet Take 0.5 mg by mouth 3 (three) times daily as needed. For anxiety      . aspirin 81 MG tablet Take 81 mg by mouth daily.      Marland Kitchen atorvastatin (LIPITOR) 80 MG tablet Take 80 mg by mouth. On Sunday and Wednesday      . azithromycin (ZITHROMAX Z-PAK) 250 MG tablet 2 today then one daily  6 each  0  . citalopram (CELEXA) 40 MG tablet Take 40 mg by mouth every morning.       Marland Kitchen doxazosin (CARDURA) 8 MG tablet TAKE 1 TABLET (8 MG TOTAL) BY MOUTH AT BEDTIME.  90 tablet  0  . doxycycline (VIBRA-TABS) 100 MG tablet Take 1 tablet (100 mg total) by mouth 2 (two) times daily.  20 tablet  0  .  Fluticasone-Salmeterol (ADVAIR) 500-50 MCG/DOSE AEPB Inhale 1 puff into the lungs every 12 (twelve) hours.  60 each  6  . guaifenesin (HUMIBID E) 400 MG TABS tablet Take 400 mg by mouth 2 (two) times daily.      Marland Kitchen HYDROcodone-acetaminophen (NORCO) 5-325 MG per tablet 1/2 to 1 tablet every 3 to 4 hours as needed for pain or cough  50 tablet  0  . losartan (COZAAR) 100 MG tablet Take 100 mg by mouth at bedtime.       Marland Kitchen SPIRIVA HANDIHALER 18 MCG inhalation capsule PLACE 1 CAPSULE (18 MCG TOTAL) INTO INHALER AND INHALE DAILY. INS WILL PAY ON 12-26-13  30 capsule  1  . temazepam (RESTORIL) 15 MG capsule TAKE 1 OR 2 CASPULES BY MOUTH FOR SLEEP IF NEEDED  30 capsule  5  . tiotropium (SPIRIVA) 18 MCG inhalation capsule Place 1 capsule (18 mcg total) into inhaler and inhale daily.  90 capsule  1  . Vitamin D, Ergocalciferol, (DRISDOL) 50000 UNITS CAPS capsule Take 50,000 Units by mouth every Wednesday.        No current facility-administered medications on file prior to visit.   Allergies  Allergen Reactions  . Paxil [Paroxetine Hcl]    Past Medical History  Diagnosis Date  . COPD (chronic obstructive pulmonary disease)   . Hypertension   . Dyslipidemia   . Hard of hearing   . Lung cancer 05/20/13  LUL, squamous cell  . 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 Skin cancer     basal cell  . Hx of radiation therapy 06/18/13- 07/23/13    LUL lung primary, 7020 cGy 26 sessions  . Type II or unspecified type diabetes mellitus without mention of complication, not stated as uncontrolled    Past Surgical History  Procedure Laterality Date  . Tonsillectomy      as child, repeat surgery early 20's  . Mohs surgery      "several times"  . Facial reconstruction surgery  25 years ago    to remove skin cancer, right side of face   Family History  Problem Relation Age of Onset  . Hypertension    . Lung cancer Sister     tx w/radiation, met to stomach  . Pneumonia Mother     Deceased  .  Heart attack Father     Deceased  . Cancer Sister     "back of neck"   History  Substance Use Topics  . Smoking status: Current Some Day Smoker -- 0.50 packs/day for 60 years    Types: Cigarettes  . Smokeless tobacco: Not on file     Comment: 06/05/13 currently 1/2 PPD, trying to quit  . Alcohol Use: Yes     Comment: occ wine, 2 drinks nightly    ROS Constitutional: Denies fever, chills, weight loss/gain, headaches, insomnia, fatigue, night sweats, and change in appetite. Eyes: Denies redness, blurred vision, diplopia, discharge, itchy, watery eyes.  ENT: Denies discharge, congestion, post nasal drip, epistaxis, sore throat, earache, hearing loss, dental pain, Tinnitus, Vertigo, Sinus pain, snoring.  Cardio: Denies chest pain, palpitations, irregular heartbeat, syncope, dyspnea, diaphoresis, orthopnea, PND, claudication, edema Respiratory: denies cough, dyspnea, DOE, pleurisy, hoarseness, laryngitis, wheezing.  Gastrointestinal: Denies dysphagia, heartburn, reflux, water brash, pain, cramps, nausea, vomiting, bloating, diarrhea, constipation, hematemesis, melena, hematochezia, jaundice, hemorrhoids Genitourinary: Denies dysuria, frequency, urgency, nocturia, hesitancy, discharge, hematuria, flank pain Breast: Breast lumps, nipple discharge, bleeding.  Musculoskeletal: Denies arthralgia, myalgia, stiffness, Jt. Swelling, pain, limp, and strain/sprain. Denies falls. Skin: Denies puritis, rash, hives, warts, acne, eczema, changing in skin lesion Neuro: No weakness, tremor, incoordination, spasms, paresthesia, pain Psychiatric: Denies confusion, memory loss, sensory loss. Denies Depression. Endocrine: Denies change in weight, skin, hair change, nocturia, and paresthesia, diabetic polys, visual blurring, hyper / hypo glycemic episodes.  Heme/Lymph: No excessive bleeding, bruising, enlarged lymph nodes.  Physical Exam  There were no vitals taken for this visit.  General Appearance: Well  nourished and in no apparent distress. Eyes: PERRLA, EOMs, conjunctiva no swelling or erythema, normal fundi and vessels. Sinuses: No frontal/maxillary tenderness ENT/Mouth: EACs patent / TMs  nl. Nares clear without erythema, swelling, mucoid exudates. Oral hygiene is good. No erythema, swelling, or exudate. Tongue normal, non-obstructing. Tonsils not swollen or erythematous. Hearing normal.  Neck: Supple, thyroid normal. No bruits, nodes or JVD. Respiratory: Respiratory effort normal.  BS equal and clear bilateral without rales, rhonci, wheezing or stridor. Cardio: Heart sounds are normal with regular rate and rhythm and no murmurs, rubs or gallops. Peripheral pulses are normal and equal bilaterally without edema. No aortic or femoral bruits. Chest: symmetric with normal excursions and percussion. Breasts: Symmetric, without lumps, nipple discharge, retractions, or fibrocystic changes.  Abdomen: Flat, soft, with bowl sounds. Nontender, no guarding, rebound, hernias, masses, or organomegaly.  Lymphatics: Non tender without lymphadenopathy.  Genitourinary:  Musculoskeletal: Full ROM all peripheral extremities, joint stability, 5/5 strength, and normal gait. Skin:  Warm and dry without rashes, lesions, cyanosis, clubbing or  ecchymosis.  Neuro: Cranial nerves intact, reflexes equal bilaterally. Normal muscle tone, no cerebellar symptoms. Sensation intact.  Pysch: Awake and oriented X 3, normal affect, Insight and Judgment appropriate.   Assessment and Plan  1. Annual Screening Examination 2. Hypertension  3. Hyperlipidemia 4. Pre Diabetes 5. Vitamin D Deficiency 6. Squamous Cell Lung Cancer  Continue prudent diet as discussed, weight control, BP monitoring, regular exercise, and medications. Discussed med's effects and SE's. Screening labs and tests as requested with regular follow-up as recommended.

## 2014-03-31 NOTE — Progress Notes (Signed)
Patient ID: Hailey Keith, female   DOB: [birth year], 79 y.o.   MRN: [id number]   Annual Screening Comprehensive Examination  This very nice 79 y.o.DWF presents for complete physical.  Patient has been followed for HTN, COPD  Prediabetes, Hyperlipidemia, and Vitamin D Deficiency. Last Aug 2014 she was found to have a malignant LUL nodule which was treated by RadioTherapy and last CTscan in April per Dr Earlie Server, it was getting smaller. She in near due for her 3 month f/u  CTsLung & appointment with Dr Earlie Server. She is also followed By Dr Keturah Barre. She has lost 13# from 158 # to 145 # over the last year and continues to smoke. Generally she feels well w/o any major complaints. She reports O2 sats on O2 run in the low 90's, but here today off O2 her resting O2 sat is 84.    HTN predates since 24. Patient's BP has been controlled at home. Today's BP: 136/58 mmHg. Patient denies any cardiac symptoms as chest pain, palpitations, shortness of breath, dizziness or ankle swelling.   Patient's hyperlipidemia is controlled with diet and medications. Patient denies myalgias or other medication SE's. Last lipids were 01/19/2014: Cholesterol, Total 151; HDL Cholesterol by NMR 61; LDL (calc) 76; Triglycerides 72   Patient has prediabetes predating since May 2012 with A1c 5.9% and A1c 6.2% in July 2013 and last A1c was 09/30/2013: Hemoglobin-A1c 5.7* Patient denies reactive hypoglycemic symptoms, visual blurring, diabetic polys, or paresthesias.   Finally, patient has history of Vitamin D Deficiency of 18 in 2008 and last Vitamin D was 09/30/2013: Vit D, 25-Hydroxy 61 . Medication Sig  . ALPRAZolam  0.5 MG tablet Take 0.5 mg by mouth 3 (three) times daily as needed. For anxiety  . aspirin 81 MG tablet Take 81 mg by mouth daily.  Marland Kitchen atorvastatin  80 MG tablet Take 80 mg by mouth. On Sunday and Wednesday  . citalopram  40 MG tablet Take 40 mg by mouth every morning.   Marland Kitchen doxazosin  8 MG tablet TAKE 1 TABLET  BY  MOUTH AT BEDTIME.  Marland Kitchen guaifenesin (HUMIBID E) 400 MG  Take 400 mg by mouth 2 (two) times daily.  Marland Kitchen losartan  100 MG tablet Take 100 mg by mouth at bedtime.   . temazepam (RESTORIL) 15 MG  TAKE 1 OR 2 CASPULES FOR SLEEP   . Vitamin D2  50,000 UNITS  Take 50,000 Units by mouth every Wednesday.    Allergies  Allergen Reactions  . Paxil [Paroxetine Hcl]    Past Medical History  Diagnosis Date  . COPD (chronic obstructive pulmonary disease)   . Hypertension   . Dyslipidemia   . Hard of hearing   . 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 Skin cancer     basal cell  . Hx of radiation therapy 06/18/13- 07/23/13    LUL lung primary, 7020 cGy 26 sessions  . Type II or unspecified type diabetes mellitus without mention of complication, not stated as uncontrolled    Past Surgical History  Procedure Laterality Date  . Tonsillectomy      as child, repeat surgery early 20's  . Mohs surgery      "several times"  . Facial reconstruction surgery  25 years ago    to remove skin cancer, right side of face   Family History  Problem Relation Age of Onset  . Hypertension    .  Lung cancer Sister     tx w/radiation, met to stomach  . Pneumonia Mother     Deceased  . Heart attack Father     Deceased  . Cancer Sister     "back of neck"   History  Substance Use Topics  . Smoking status: Current Some Day Smoker -- 0.50 packs/day for 60 years    Types: Cigarettes  . Smokeless tobacco: Not on file     Comment: 06/05/13 currently 1/2 PPD, trying to quit  . Alcohol Use: Yes     Comment: occ wine, 2 drinks nightly    ROS Constitutional: Denies fever, chills, weight loss/gain, headaches, insomnia, fatigue, night sweats, and change in appetite. Eyes: Denies redness, blurred vision, diplopia, discharge, itchy, watery eyes.  ENT: Denies discharge, congestion, post nasal drip, epistaxis, sore throat, earache, hearing loss, dental pain, Tinnitus,  Vertigo, Sinus pain, snoring.  Cardio: Denies chest pain, palpitations, irregular heartbeat, syncope, dyspnea, diaphoresis, orthopnea, PND, claudication, edema Respiratory: denies cough, dyspnea, DOE, pleurisy, hoarseness, laryngitis, wheezing.  Gastrointestinal: Denies dysphagia, heartburn, reflux, water brash, pain, cramps, nausea, vomiting, bloating, diarrhea, constipation, hematemesis, melena, hematochezia, jaundice, hemorrhoids Genitourinary: Denies dysuria, frequency, urgency, nocturia, hesitancy, discharge, hematuria, flank pain Breast: Breast lumps, nipple discharge, bleeding.  Musculoskeletal: Denies arthralgia, myalgia, stiffness, Jt. Swelling, pain, limp, and strain/sprain. Denies falls. Skin: Denies puritis, rash, hives, warts, acne, eczema, changing in skin lesion Neuro: No weakness, tremor, incoordination, spasms, paresthesia, pain Psychiatric: Denies confusion, memory loss, sensory loss. Denies Depression. Endocrine: Denies change in weight, skin, hair change, nocturia, and paresthesia, diabetic polys, visual blurring, hyper / hypo glycemic episodes.  Heme/Lymph: No excessive bleeding, bruising, enlarged lymph nodes.  Physical Exam  BP 136/58  Pulse 100  Temp(Src) 97.9 F (36.6 C) (Temporal)  Resp 24  Ht $R'5\' 4"'Wh$  (1.626 m)  Wt 145 lb 9.6 oz (66.044 kg)  BMI 24.98 kg/m2  General Appearance: very thin appearing frail but in no apparent distress. Eyes: PERRLA, EOMs, conjunctiva no swelling or erythema, normal fundi and vessels. Sinuses: No frontal/maxillary tenderness ENT/Mouth: EACs patent / TMs  nl. Nares clear without erythema, swelling, mucoid exudates. Oral hygiene is good. No erythema, swelling, or exudate. Tongue normal, non-obstructing. Tonsils not swollen or erythematous. Hearing normal.  Neck: Supple, thyroid normal. No bruits, nodes or JVD. Respiratory: Respiratory effort normal.  BS equal and clear bilateral with scattered  Rhonci, but no rales,wheezing or  stridor. Cardio: Heart sounds are normal with regular rate and rhythm and no murmurs, rubs or gallops. Peripheral pulses are normal and equal bilaterally without edema. No aortic or femoral bruits. Chest: symmetric with normal excursions and percussion. Breasts: Symmetric, without lumps, nipple discharge, retractions, or fibrocystic changes.  Abdomen: Flat, soft, with bowl sounds. Nontender, no guarding, rebound, hernias, masses, or organomegaly.  Lymphatics: Non tender without lymphadenopathy.  Musculoskeletal: Full ROM all peripheral extremities, joint stability, 5/5 strength, and normal gait. Skin: Warm and dry without rashes, lesions, cyanosis, clubbing or  ecchymosis.  Neuro: Cranial nerves intact, reflexes equal bilaterally. Normal muscle tone, no cerebellar symptoms. Sensation intact.  Pysch: Awake and oriented X 3, normal affect, Insight and Judgment appropriate.   Assessment and Plan  1. Annual Screening Examination 2. Hypertension  3. Hyperlipidemia 4. Pre Diabetes 5. Vitamin D Deficiency 6. Malignant LUL Nodule of Lung - in remission  Continue prudent diet as discussed, weight control, BP monitoring, regular exercise, and medications. Discussed med's effects and SE's. Screening labs and tests as requested with regular follow-up as recommended. Discouraged smoking  Also, patient very concerned about the $500/month cost of Advair & Spiriva now that she's in the donut hole. She was advised to see how she does on Duoneb alone and she does use a rescue MDI when Away from home.

## 2014-04-01 LAB — URINALYSIS, MICROSCOPIC ONLY
Bacteria, UA: NONE SEEN
Casts: NONE SEEN
Crystals: NONE SEEN

## 2014-04-01 LAB — HEPATIC FUNCTION PANEL
ALT: 12 U/L (ref 0–35)
AST: 21 U/L (ref 0–37)
Albumin: 3.8 g/dL (ref 3.5–5.2)
Alkaline Phosphatase: 89 U/L (ref 39–117)
Bilirubin, Direct: 0.1 mg/dL (ref 0.0–0.3)
Indirect Bilirubin: 0.6 mg/dL (ref 0.2–1.2)
TOTAL PROTEIN: 6.3 g/dL (ref 6.0–8.3)
Total Bilirubin: 0.7 mg/dL (ref 0.2–1.2)

## 2014-04-01 LAB — LIPID PANEL
CHOLESTEROL: 173 mg/dL (ref 0–200)
HDL: 66 mg/dL (ref >39)
LDL Cholesterol: 87 mg/dL (ref 0–99)
Total CHOL/HDL Ratio: 2.6 Ratio
Triglycerides: 100 mg/dL (ref <150)
VLDL: 20 mg/dL (ref 0–40)

## 2014-04-01 LAB — BASIC METABOLIC PANEL WITH GFR
BUN: 9 mg/dL (ref 6–23)
CALCIUM: 9.6 mg/dL (ref 8.4–10.5)
CO2: 29 meq/L (ref 19–32)
Chloride: 98 mEq/L (ref 96–112)
Creat: 0.68 mg/dL (ref 0.50–1.10)
GFR, Est African American: >89 mL/min
GFR, Est Non African American: 85 mL/min
Glucose, Bld: 82 mg/dL (ref 70–99)
POTASSIUM: 3.9 meq/L (ref 3.5–5.3)
SODIUM: 139 meq/L (ref 135–145)

## 2014-04-01 LAB — TSH: TSH: 1.943 u[IU]/mL (ref 0.350–4.500)

## 2014-04-01 LAB — VITAMIN D 25 HYDROXY (VIT D DEFICIENCY, FRACTURES): VIT D 25 HYDROXY: 103 ng/mL — AB (ref 30–89)

## 2014-04-01 LAB — INSULIN, FASTING: Insulin fasting, serum: 7 u[IU]/mL (ref 3–28)

## 2014-04-01 LAB — MAGNESIUM: MAGNESIUM: 1.8 mg/dL (ref 1.5–2.5)

## 2014-04-01 LAB — MICROALBUMIN / CREATININE URINE RATIO
CREATININE, URINE: 119.9 mg/dL
MICROALB UR: 3.49 mg/dL — AB (ref 0.00–1.89)
MICROALB/CREAT RATIO: 29.1 mg/g (ref 0.0–30.0)

## 2014-04-03 ENCOUNTER — Other Ambulatory Visit: Payer: Self-pay | Admitting: Internal Medicine

## 2014-04-29 ENCOUNTER — Other Ambulatory Visit: Payer: Self-pay | Admitting: Internal Medicine

## 2014-04-29 ENCOUNTER — Ambulatory Visit (INDEPENDENT_AMBULATORY_CARE_PROVIDER_SITE_OTHER): Payer: Medicare Other | Admitting: Internal Medicine

## 2014-04-29 ENCOUNTER — Encounter: Payer: Self-pay | Admitting: Internal Medicine

## 2014-04-29 VITALS — BP 122/66 | HR 89 | Ht 64.25 in | Wt 143.0 lb

## 2014-04-29 DIAGNOSIS — C3490 Malignant neoplasm of unspecified part of unspecified bronchus or lung: Secondary | ICD-10-CM

## 2014-04-29 DIAGNOSIS — J449 Chronic obstructive pulmonary disease, unspecified: Secondary | ICD-10-CM | POA: Diagnosis not present

## 2014-04-29 DIAGNOSIS — F17200 Nicotine dependence, unspecified, uncomplicated: Secondary | ICD-10-CM | POA: Diagnosis not present

## 2014-04-29 DIAGNOSIS — Z23 Encounter for immunization: Secondary | ICD-10-CM | POA: Diagnosis not present

## 2014-04-29 DIAGNOSIS — J4489 Other specified chronic obstructive pulmonary disease: Secondary | ICD-10-CM | POA: Diagnosis not present

## 2014-04-29 NOTE — Patient Instructions (Signed)
To save money, see if you are stable not using Advair or Spiriva now--- just use your nebulizer with Duoneb/ ipratropium and albuterol, 3-4 times per day.  Use rescue inhaler  2 puffs, up to 4 times daily if needed  Order- Prevnar-13 pneumococcal vaccine

## 2014-04-29 NOTE — Progress Notes (Signed)
05/14/13- 36 yoF smoker referred courtesy of Dr Melford Aase; review PET scan to do biopsy of lung. Daughter here. Left upper lobe mass found on chest x-ray with routine physical. She has been a one pack per day smoker, now down to a few cigarettes every other day. Dyspnea on exertion less than one flight of stairs, less than one city block. Daily cough with clear mucus. Denies chest pain, blood, fever, swollen glands. Has gained 15 pounds eating ice cream. Recently changed from Advair to nebulizer with albuterol, plus Spiriva. History of pneumonia with sepsis in the past. Denies heart disease or phlebitis. Right facial surgery for a nonmelanoma skin cancer She had lived alone but is now staying with her daughter. Retired from [employer] work. A sister has survived lung cancer.  Office Spirometry 05/14/13-  Moderate restrictive disease based on exhaled volume. FVC 1.58/57%, FEV1 1.41/68%, FEV1/FVC 0.89  CT 04/28/13 IMPRESSION:  There is a spiculated mass in the left upper lobe. The lesion has  finger-like projections towards the periphery which could represent  small satellite lesions or lymphangitic involvement. Findings are  suspicious for a primary bronchogenic neoplasm. There are a few  prominent but nonspecific mediastinal lymph nodes. Recommend a PET-  CT for further characterization of the left upper lung mass and  mediastinal lymph nodes.  Centrilobular emphysema. Scattered interstitial thickening with  areas of nodularity in the lungs, particularly the right side.  This most likely represents postinflammatory change. There is also  a small amount of focal consolidation in the right middle lobe.  Extensive atherosclerotic disease, including the coronary arteries.  These results will be called to the ordering clinician or  representative by the Radiologist Assistant, and communication  documented in the PACS Dashboard.  Original Report Authenticated By: Markus Daft,  M.D. PET8/27/14 IMPRESSION:  1. Hypermetabolic right upper lobe pulmonary mass consistent  bronchogenic carcinoma. Hypermetabolic nodule superior to the mass  consistent with local extension of tumor.  2. No hypermetabolic mediastinal lymph nodes  3. Small focus of metabolic activity in the left lower lobe is  likely inflammatory. Recommend attention on follow-up.  4. Small pulmonology nodule in the left lower lobe measuring 5 mm  does not have metabolic activity. Recommend attention on follow-up.  5. Pleural-parenchymal thickening in the right upper lobe is not  hypermetabolic and likely post inflammatory or infectious.  Electronically Signed  By: Suzy Bouchard M.D.  On: 05/13/2013 12:36   05/25/13- 89 yoF smoker referred courtesy of Dr Melford Aase; LUL mass/ Squamous Cell Ca Needle bx POS Squamous Cell Ca Lung. To be discussed at [facility] in 3 days, and scheduled for [place]. She tolerated needle biopsy very well, without complication. Little change in cough, no chest pain, blood or swollen glands. Blames "nerves" for insomnia, which began before prednisone. Prednisone is now finished. Xanax did not help.  07/28/13- 78 yoF smoker followed for COPD complicated by squamous cell CA lung/ XRT, insomnia Daughter here. FOLLOWS FOR: has noticed more of a cough past few days-productive-thick. Would like to have refill for Restoril. No fever, sore throat or swollen glands. Continues followup with radiation therapy after treatment.  11/24/13- 78 yoF smoker followed for COPD complicated by squamous cell CA lung/ XRT, insomnia Daughter here. FOLLOWS FOR: more SOB than usual Coughing green x 2-3 days w/o F, sore throat.  Insomnia. CT chest 09/07/13 IMPRESSION:  1. Interval decrease and volume of left upper lobe bronchogenic  carcinoma which is now nodular size compared to mass size on prior.  2. Some improvement in the nodularity peripheral to the dominant  lesion.  3. Stable small additional  pulmonary nodules on the left.  4. Improvement pleural parenchymal thickening in the right middle  lobe.  Electronically Signed  By: Suzy Bouchard M.D.  On: 09/07/2013 16:44  04/29/14- 73 yoF smoker followed for COPD complicated by squamous cell CA lung/ XRT, insomnia FOLLOWS FOR: Continues to have SOB-no worse than last OV but no better Avoids heat and humiditiy staying in. Using neb.- discussed medication costs especially of Advair, Spiriva. Says she's trying to quit smoking CT chest 12/18/13 IMPRESSION:  Further slight interval decrease in the size of the left upper lobe  spiculated nodule.  Other smaller left lung nodules are stable.  Stable COPD/chronic changes.  Severe coronary artery calcifications.  Electronically Signed  By: Rolm Baptise M.D.  On: 12/08/2013 11:51   ROS-see HPI Constitutional:   No-   weight loss, night sweats, fevers, chills, fatigue, lassitude. HEENT:   + headaches,no- difficulty swallowing, tooth/dental problems, sore throat,       No-  sneezing, itching, ear ache, nasal congestion, post nasal drip,  CV:  No-   chest pain, orthopnea, PND, swelling in lower extremities, anasarca,  dizziness, palpitations Resp: +shortness of breath with exertion or at rest.                productive cough,  No non-productive cough,  No- coughing up of blood.               change in color of mucus.   wheezing.   Skin: No-   rash or lesions. GI:  No-   heartburn, indigestion, abdominal pain, nausea, vomiting,  GU:  MS:  No-   joint pain or swelling.   Neuro-     nothing unusual Psych:  No- change in mood or affect. + depression or anxiety.  No memory loss.  OBJ- Physical Exam General- Alert, Oriented, Affect-appropriate, Distress- none acute, trying to be cheerful Skin- rash-none, lesions- none, excoriation- none Lymphadenopathy- none Head- +surgical defect right temple            Eyes- Gross vision intact, PERRLA, conjunctivae and secretions clear            Ears-  +Deaf R, +hearing aid  L            Nose- Clear, no-Septal dev, mucus, polyps, erosion, perforation             Throat- Mallampati II , mucosa clear , drainage- none, tonsils- atrophic Neck- flexible , trachea midline, no stridor , thyroid nl, carotid no bruit Chest - symmetrical excursion , unlabored           Heart/CV- RRR , no murmur , no gallop  , no rub, nl s1 s2                           - JVD- none , edema- none, stasis changes- none, varices- none           Lung- clear, distant, unlabored , dullness-none, rub- none           Chest wall-  Abd-  Br/ Gen/ Rectal- Not done, not indicated Extrem- cyanosis- none, clubbing, none, atrophy- none, strength- nl Neuro- grossly intact to observation

## 2014-04-30 ENCOUNTER — Telehealth: Payer: Self-pay | Admitting: Internal Medicine

## 2014-04-30 NOTE — Telephone Encounter (Signed)
lvm for pt regarding to Sept appts....mailed pt appt sched and letter

## 2014-05-02 ENCOUNTER — Other Ambulatory Visit: Payer: Self-pay | Admitting: Internal Medicine

## 2014-05-20 ENCOUNTER — Other Ambulatory Visit (HOSPITAL_BASED_OUTPATIENT_CLINIC_OR_DEPARTMENT_OTHER): Payer: Medicare Other

## 2014-05-20 ENCOUNTER — Encounter (HOSPITAL_COMMUNITY): Payer: Self-pay

## 2014-05-20 ENCOUNTER — Ambulatory Visit (HOSPITAL_COMMUNITY)
Admission: RE | Admit: 2014-05-20 | Discharge: 2014-05-20 | Disposition: A | Discharge status: Home / Self Care | Payer: Medicare Other | Source: Ambulatory Visit | Attending: Internal Medicine | Admitting: Internal Medicine

## 2014-05-20 DIAGNOSIS — C3490 Malignant neoplasm of unspecified part of unspecified bronchus or lung: Secondary | ICD-10-CM | POA: Insufficient documentation

## 2014-05-20 DIAGNOSIS — Z923 Personal history of irradiation: Secondary | ICD-10-CM | POA: Diagnosis not present

## 2014-05-20 DIAGNOSIS — I10 Essential (primary) hypertension: Secondary | ICD-10-CM

## 2014-05-20 DIAGNOSIS — C3410 Malignant neoplasm of upper lobe, unspecified bronchus or lung: Secondary | ICD-10-CM

## 2014-05-20 LAB — CBC WITH DIFFERENTIAL/PLATELET
BASO%: 0.5 % (ref 0.0–2.0)
Basophils Absolute: 0 10*3/uL (ref 0.0–0.1)
EOS%: 1.9 % (ref 0.0–7.0)
Eosinophils Absolute: 0.2 10*3/uL (ref 0.0–0.5)
HCT: 43.2 % (ref 34.8–46.6)
HGB: 13.9 g/dL (ref 11.6–15.9)
LYMPH%: 10.4 % — AB (ref 14.0–49.7)
MCH: 29.6 pg (ref 25.1–34.0)
MCHC: 32.3 g/dL (ref 31.5–36.0)
MCV: 91.6 fL (ref 79.5–101.0)
MONO#: 0.8 10*3/uL (ref 0.1–0.9)
MONO%: 8.7 % (ref 0.0–14.0)
NEUT#: 7.5 10*3/uL — ABNORMAL HIGH (ref 1.5–6.5)
NEUT%: 78.5 % — ABNORMAL HIGH (ref 38.4–76.8)
Platelets: 218 10*3/uL (ref 145–400)
RBC: 4.71 10*6/uL (ref 3.70–5.45)
RDW: 13.7 % (ref 11.2–14.5)
WBC: 9.5 10*3/uL (ref 3.9–10.3)
lymph#: 1 10*3/uL (ref 0.9–3.3)

## 2014-05-20 LAB — COMPREHENSIVE METABOLIC PANEL (CC13)
ALK PHOS: 93 U/L (ref 40–150)
ALT: 6 U/L (ref 0–55)
AST: 19 U/L (ref 5–34)
Albumin: 3.4 g/dL — ABNORMAL LOW (ref 3.5–5.0)
Anion Gap: 8 mEq/L (ref 3–11)
BUN: 13.6 mg/dL (ref 7.0–26.0)
CO2: 29 mEq/L (ref 22–29)
Calcium: 10 mg/dL (ref 8.4–10.4)
Chloride: 103 mEq/L (ref 98–109)
Creatinine: 0.8 mg/dL (ref 0.6–1.1)
Glucose: 148 mg/dl — ABNORMAL HIGH (ref 70–140)
POTASSIUM: 4.4 meq/L (ref 3.5–5.1)
SODIUM: 140 meq/L (ref 136–145)
TOTAL PROTEIN: 6.2 g/dL — AB (ref 6.4–8.3)
Total Bilirubin: 0.53 mg/dL (ref 0.20–1.20)

## 2014-05-20 MED ORDER — IOHEXOL 300 MG/ML  SOLN
80.0000 mL | Freq: Once | INTRAMUSCULAR | Status: AC | PRN
  Administered 2014-05-20: 80 mL via INTRAVENOUS

## 2014-05-25 ENCOUNTER — Encounter: Payer: Self-pay | Admitting: Internal Medicine

## 2014-05-25 ENCOUNTER — Telehealth: Payer: Self-pay | Admitting: Internal Medicine

## 2014-05-25 ENCOUNTER — Ambulatory Visit (HOSPITAL_BASED_OUTPATIENT_CLINIC_OR_DEPARTMENT_OTHER): Payer: Medicare Other | Admitting: Internal Medicine

## 2014-05-25 VITALS — BP 118/53 | HR 75 | Temp 98.3°F | Ht 64.25 in | Wt 140.3 lb

## 2014-05-25 DIAGNOSIS — C3410 Malignant neoplasm of upper lobe, unspecified bronchus or lung: Secondary | ICD-10-CM

## 2014-05-25 DIAGNOSIS — C3490 Malignant neoplasm of unspecified part of unspecified bronchus or lung: Secondary | ICD-10-CM

## 2014-05-25 NOTE — Patient Instructions (Signed)
Smoking Cessation, Tips for Success  If you are ready to quit smoking, congratulations! You have chosen to help yourself be healthier. Cigarettes bring nicotine, tar, carbon monoxide, and other irritants into your body. Your lungs, heart, and blood vessels will be able to work better without these poisons. There are many different ways to quit smoking. Nicotine gum, nicotine patches, a nicotine inhaler, or nicotine nasal spray can help with physical craving. Hypnosis, support groups, and medicines help break the habit of smoking.  WHAT THINGS CAN I DO TO MAKE QUITTING EASIER?   Here are some tips to help you quit for good:  · Pick a date when you will quit smoking completely. Tell all of your friends and family about your plan to quit on that date.  · Do not try to slowly cut down on the number of cigarettes you are smoking. Pick a quit date and quit smoking completely starting on that day.  · Throw away all cigarettes.    · Clean and remove all ashtrays from your home, work, and car.  · On a card, write down your reasons for quitting. Carry the card with you and read it when you get the urge to smoke.  · Cleanse your body of nicotine. Drink enough water and fluids to keep your urine clear or pale yellow. Do this after quitting to flush the nicotine from your body.  · Learn to predict your moods. Do not let a bad situation be your excuse to have a cigarette. Some situations in your life might tempt you into wanting a cigarette.  · Never have "just one" cigarette. It leads to wanting another and another. Remind yourself of your decision to quit.  · Change habits associated with smoking. If you smoked while driving or when feeling stressed, try other activities to replace smoking. Stand up when drinking your coffee. Brush your teeth after eating. Sit in a different chair when you read the paper. Avoid alcohol while trying to quit, and try to drink fewer caffeinated beverages. Alcohol and caffeine may urge you to  smoke.  · Avoid foods and drinks that can trigger a desire to smoke, such as sugary or spicy foods and alcohol.  · Ask people who smoke not to smoke around you.  · Have something planned to do right after eating or having a cup of coffee. For example, plan to take a walk or exercise.  · Try a relaxation exercise to calm you down and decrease your stress. Remember, you may be tense and nervous for the first 2 weeks after you quit, but this will pass.  · Find new activities to keep your hands busy. Play with a pen, coin, or rubber band. Doodle or draw things on paper.  · Brush your teeth right after eating. This will help cut down on the craving for the taste of tobacco after meals. You can also try mouthwash.    · Use oral substitutes in place of cigarettes. Try using lemon drops, carrots, cinnamon sticks, or chewing gum. Keep them handy so they are available when you have the urge to smoke.  · When you have the urge to smoke, try deep breathing.  · Designate your home as a nonsmoking area.  · If you are a heavy smoker, ask your health care provider about a prescription for nicotine chewing gum. It can ease your withdrawal from nicotine.  · Reward yourself. Set aside the cigarette money you save and buy yourself something nice.  · Look for   support from others. Join a support group or smoking cessation program. Ask someone at home or at work to help you with your plan to quit smoking.  · Always ask yourself, "Do I need this cigarette or is this just a reflex?" Tell yourself, "Today, I choose not to smoke," or "I do not want to smoke." You are reminding yourself of your decision to quit.  · Do not replace cigarette smoking with electronic cigarettes (commonly called e-cigarettes). The safety of e-cigarettes is unknown, and some may contain harmful chemicals.  · If you relapse, do not give up! Plan ahead and think about what you will do the next time you get the urge to smoke.  HOW WILL I FEEL WHEN I QUIT SMOKING?  You  may have symptoms of withdrawal because your body is used to nicotine (the addictive substance in cigarettes). You may crave cigarettes, be irritable, feel very hungry, cough often, get headaches, or have difficulty concentrating. The withdrawal symptoms are only temporary. They are strongest when you first quit but will go away within 10-14 days. When withdrawal symptoms occur, stay in control. Think about your reasons for quitting. Remind yourself that these are signs that your body is healing and getting used to being without cigarettes. Remember that withdrawal symptoms are easier to treat than the major diseases that smoking can cause.   Even after the withdrawal is over, expect periodic urges to smoke. However, these cravings are generally short lived and will go away whether you smoke or not. Do not smoke!  WHAT RESOURCES ARE AVAILABLE TO HELP ME QUIT SMOKING?  Your health care provider can direct you to community resources or hospitals for support, which may include:  · Group support.  · Education.  · Hypnosis.  · Therapy.  Document Released: 05/18/2004 Document Revised: 01/04/2014 Document Reviewed: 02/05/2013  [employer]® Patient Information ©2015 [employer]. This information is not intended to replace advice given to you by your health care provider. Make sure you discuss any questions you have with your health care provider.

## 2014-05-25 NOTE — Telephone Encounter (Signed)
Pt confirmed labs/ov per 09/22 POF, gave pt AVS.....KJ °

## 2014-05-25 NOTE — Progress Notes (Signed)
[place] [phone]   Fax:([phone]) [place], [place] [address] [place] [address]  DIAGNOSIS: Stage IIB (T3, N0, M0) non-small cell lung cancer, squamous cell carcinoma of the left upper lung diagnosed in August of 2014.  PRIOR THERAPY: Curative radiotherapy to the left upper lobe lung mass under the care of Dr. Valere Dross completed on 07/23/2013.  CURRENT THERAPY: Observation.  INTERVAL HISTORY: Hailey Keith 79 y.o. female returns to the clinic today for followup visit accompanied by her daughter. The patient is doing very well today with no specific complaints except for the baseline shortness of breath and she is currently on home oxygen especially with exertion. She denied having any significant chest pain , cough or hemoptysis. She denied having any fever or chills, no nausea or vomiting. She had repeat CT scan of the chest performed recently and she is here for evaluation and discussion of her scan results.  MEDICAL HISTORY: Past Medical History  Diagnosis Date  . COPD (chronic obstructive pulmonary disease)   . Hypertension   . Dyslipidemia   . Hard of hearing   . Hx of radiation therapy 06/18/13- 07/23/13    LUL lung primary, 7020 cGy 26 sessions  . 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 Skin cancer     basal cell  . Type II or unspecified type diabetes mellitus without mention of complication, not stated as uncontrolled     ALLERGIES:  is allergic to paxil.  MEDICATIONS:  Current Outpatient Prescriptions  Medication Sig Dispense Refill  . ALPRAZolam (XANAX) 0.5 MG tablet Take 0.5 mg by mouth 3 (three) times daily as needed. For anxiety      . aspirin 81 MG tablet Take 81 mg by mouth daily.      Marland Kitchen atorvastatin (LIPITOR) 80 MG tablet Take 80 mg by mouth. On Sunday and Wednesday      . citalopram (CELEXA) 40 MG tablet  TAKE 1 TABLET BY MOUTH DAILY FOR MOOD  90 tablet  2  . doxazosin (CARDURA) 8 MG tablet TAKE 1 TABLET (8 MG TOTAL) BY MOUTH AT BEDTIME.  90 tablet  0  . doxazosin (CARDURA) 8 MG tablet TAKE 1 TABLET (8 MG TOTAL) BY MOUTH AT BEDTIME.  90 tablet  0  . fluorouracil (EFUDEX) 5 % cream       . guaifenesin (HUMIBID E) 400 MG TABS tablet Take 400 mg by mouth 2 (two) times daily.      Marland Kitchen ipratropium-albuterol (DUONEB) 0.5-2.5 (3) MG/3ML SOLN Take 3 mLs by nebulization 4 (four) times daily.  360 mL  99  . losartan (COZAAR) 100 MG tablet TAKE 1 TABLET BY MOUTH DAILY AS NEEDED FOR BLOOD PRESSURE  90 tablet  99  . temazepam (RESTORIL) 15 MG capsule TAKE 1 OR 2 CASPULES BY MOUTH FOR SLEEP IF NEEDED  30 capsule  5  . Vitamin D, Ergocalciferol, (DRISDOL) 50000 UNITS CAPS capsule Take 50,000 Units by mouth every Wednesday.       Marland Kitchen albuterol (PROVENTIL HFA;VENTOLIN HFA) 108 (90 BASE) MCG/ACT inhaler 1 to 2 inhalations every 4 hours as needed for Rescue Asthma  1 Inhaler  99   No current facility-administered medications for this visit.    SURGICAL HISTORY:  Past Surgical History  Procedure Laterality Date  . Tonsillectomy      as child, repeat  surgery early 20's  . Mohs surgery      "several times"  . Facial reconstruction surgery  25 years ago    to remove skin cancer, right side of face    REVIEW OF SYSTEMS:  A comprehensive review of systems was negative except for: Respiratory: positive for dyspnea on exertion   PHYSICAL EXAMINATION: General appearance: alert, cooperative and no distress Head: Normocephalic, without obvious abnormality, atraumatic Neck: no adenopathy, no JVD, supple, symmetrical, trachea midline and thyroid not enlarged, symmetric, no tenderness/mass/nodules Lymph nodes: Cervical, supraclavicular, and axillary nodes normal. Resp: clear to auscultation bilaterally Back: symmetric, no curvature. ROM normal. No CVA tenderness. Cardio: regular rate and rhythm, S1, S2 normal, no murmur,  click, rub or gallop GI: soft, non-tender; bowel sounds normal; no masses,  no organomegaly Extremities: extremities normal, atraumatic, no cyanosis or edema  ECOG PERFORMANCE STATUS: 2 - Symptomatic, <50% confined to bed  Blood pressure 118/53, pulse 75, temperature 98.3 F (36.8 C), temperature source Oral, height 5' 4.25" (1.632 m), weight 140 lb 4.8 oz (63.64 kg), SpO2 93.00%.  LABORATORY DATA: Lab Results  Component Value Date   WBC 9.5 05/20/2014   HGB 13.9 05/20/2014   HCT 43.2 05/20/2014   MCV 91.6 05/20/2014   PLT 218 05/20/2014      Chemistry      Component Value Date/Time   NA 140 05/20/2014 1442   NA 139 03/31/2014 1558   K 4.4 05/20/2014 1442   K 3.9 03/31/2014 1558   CL 98 03/31/2014 1558   CO2 29 05/20/2014 1442   CO2 29 03/31/2014 1558   BUN 13.6 05/20/2014 1442   BUN 9 03/31/2014 1558   CREATININE 0.8 05/20/2014 1442   CREATININE 0.68 03/31/2014 1558   CREATININE 0.7 05/14/2013 1717      Component Value Date/Time   CALCIUM 10.0 05/20/2014 1442   CALCIUM 9.6 03/31/2014 1558   ALKPHOS 93 05/20/2014 1442   ALKPHOS 89 03/31/2014 1558   AST 19 05/20/2014 1442   AST 21 03/31/2014 1558   ALT 6 05/20/2014 1442   ALT 12 03/31/2014 1558   BILITOT 0.53 05/20/2014 1442   BILITOT 0.7 03/31/2014 1558       RADIOGRAPHIC STUDIES: Ct Chest W Contrast  05/20/2014   CLINICAL DATA:  Followup lung carcinoma. Completed radiation therapy. Cough and shortness of breath.  EXAM: CT CHEST WITH CONTRAST  TECHNIQUE: Multidetector CT imaging of the chest was performed during intravenous contrast administration.  CONTRAST:  48mL OMNIPAQUE IOHEXOL 300 MG/ML  SOLN  COMPARISON:  12/08/2013  FINDINGS: Mediastinum/Hilar Regions: No masses or pathologically enlarged lymph nodes identified.  Other Thoracic Lymphadenopathy:  None.  Lungs: Moderate to severe emphysema and peripheral interstitial fibrosis in the dependent portions of the lower lobes remain stable in appearance. Spiculated nodule in the left upper  lobe measuring 20 x 7 mm on image 21 remains stable. Other 9 mm spiculated nodule in the medial left lung apex on image 14, 18 mm nodular opacity in the right middle lobe on image 40 and multiple other tiny sub-cm bilateral pulmonary nodules also show no significant change.  Pleura:  No evidence of effusion or mass.  Vascular/Cardiac: No thoracic aortic aneurysm or other significant abnormality identified.  Musculoskeletal:  No suspicious bone lesions identified.  Other:  None.  IMPRESSION: Stable bilateral pulmonary nodules, including the dominant nodule in the left upper lobe.  Stable emphysema and bibasilar interstitial fibrosis.  No new or progressive disease within the thorax.   Electronically Signed  By: Earle Gell M.D.   On: 05/20/2014 17:29   ASSESSMENT AND PLAN: This is a very pleasant 79 years old white female with history of stage IIB non-small cell lung cancer, squamous cell carcinoma status post curative radiotherapy to the left upper lobe lung mass with partial response. The recent CT scan of the chest showed a stable disease with a stable bilateral pulmonary nodules. I discussed the scan results with the patient and her daughter.  I recommended for her to continue on observation for now with repeat CT scan of the chest in 6 months. She was advised to call immediately if she has any concerning symptoms in the interval. The patient voices understanding of current disease status and treatment options and is in agreement with the current care plan.  All questions were answered. The patient knows to call the clinic with any problems, questions or concerns. We can certainly see the patient much sooner if necessary.  Disclaimer: This note was dictated with voice recognition software. Similar sounding words can inadvertently be transcribed and may not be corrected upon review.

## 2014-06-29 ENCOUNTER — Ambulatory Visit: Payer: Medicare Other | Admitting: Internal Medicine

## 2014-07-01 ENCOUNTER — Other Ambulatory Visit: Payer: Self-pay | Admitting: Internal Medicine

## 2014-07-07 ENCOUNTER — Ambulatory Visit: Payer: Self-pay | Admitting: Physician Assistant

## 2014-07-13 ENCOUNTER — Ambulatory Visit: Payer: Medicare Other | Admitting: Internal Medicine

## 2014-07-22 ENCOUNTER — Ambulatory Visit (INDEPENDENT_AMBULATORY_CARE_PROVIDER_SITE_OTHER): Payer: Medicare Other | Admitting: Emergency Medicine

## 2014-07-22 ENCOUNTER — Encounter: Payer: Self-pay | Admitting: Emergency Medicine

## 2014-07-22 VITALS — BP 120/64 | HR 72 | Temp 98.0°F | Resp 16 | Ht 64.0 in | Wt 139.0 lb

## 2014-07-22 DIAGNOSIS — Z0001 Encounter for general adult medical examination with abnormal findings: Secondary | ICD-10-CM

## 2014-07-22 DIAGNOSIS — Z1331 Encounter for screening for depression: Secondary | ICD-10-CM

## 2014-07-22 DIAGNOSIS — IMO0001 Reserved for inherently not codable concepts without codable children: Secondary | ICD-10-CM

## 2014-07-22 DIAGNOSIS — Z23 Encounter for immunization: Secondary | ICD-10-CM

## 2014-07-22 DIAGNOSIS — R6889 Other general symptoms and signs: Secondary | ICD-10-CM

## 2014-07-22 DIAGNOSIS — R739 Hyperglycemia, unspecified: Secondary | ICD-10-CM

## 2014-07-22 DIAGNOSIS — Z0000 Encounter for general adult medical examination without abnormal findings: Secondary | ICD-10-CM

## 2014-07-22 DIAGNOSIS — I10 Essential (primary) hypertension: Secondary | ICD-10-CM

## 2014-07-22 DIAGNOSIS — E782 Mixed hyperlipidemia: Secondary | ICD-10-CM

## 2014-07-22 NOTE — Progress Notes (Signed)
Patient ID: Hailey Keith, female   DOB: 1935-08-20, 79 y.o.   MRN: [id number] Subjective:   Hailey Keith is a 79 y.o. female who presents for Medicare Annual Wellness Visit and 3 month follow up on hypertension, prediabetes, hyperlipidemia, vitamin D def.  Date of last medicare wellness visit is unknown.   Her blood pressure has been controlled at home, today their BP is BP: 120/64 mmHg She does not workout. She denies chest pain, shortness of breath, dizziness.  She is on cholesterol medication and denies myalgias. Her cholesterol is at goal. The cholesterol last visit was:   Lab Results  Component Value Date   CHOL 163 07/22/2014   [name] 07/22/2014   LDLCALC 83 07/22/2014   TRIG 79 07/22/2014   CHOLHDL 2.5 07/22/2014   She has been working on diet and exercise for prediabetes, and denies foot ulcerations and visual disturbances. Last A1C in the office was:  Lab Results  Component Value Date   HGBA1C 5.6 07/22/2014   Patient is on Vitamin D supplement.  Names of Other Physician/Practitioners you currently use:  Patient Care Team: Unk Pinto, [name], [name], [name], [name], [name], [name], MD as Consulting Physician (Pulmonary Disease)  Medication Review Current Outpatient Prescriptions on File Prior to Visit  Medication Sig Dispense Refill  . albuterol (PROVENTIL HFA;VENTOLIN HFA) 108 (90 BASE) MCG/ACT inhaler 1 to 2 inhalations every 4 hours as needed for Rescue Asthma 1 Inhaler 99  . ALPRAZolam (XANAX) 0.5 MG tablet Take 0.5 mg by mouth 3 (three) times daily as needed. For anxiety    . aspirin 81 MG tablet Take 81 mg by mouth daily.    Marland Kitchen  atorvastatin (LIPITOR) 80 MG tablet Take 80 mg by mouth. On Sunday and Wednesday    . citalopram (CELEXA) 40 MG tablet TAKE 1 TABLET BY MOUTH DAILY FOR MOOD 90 tablet 2  . doxazosin (CARDURA) 8 MG tablet TAKE 1 TABLET (8 MG TOTAL) BY MOUTH AT BEDTIME. 90 tablet 0  . guaifenesin (HUMIBID E) 400 MG TABS tablet Take 400 mg by mouth 2 (two) times daily.    Marland Kitchen ipratropium-albuterol (DUONEB) 0.5-2.5 (3) MG/3ML SOLN Take 3 mLs by nebulization 4 (four) times daily. 360 mL 99  . losartan (COZAAR) 100 MG tablet TAKE 1 TABLET BY MOUTH DAILY AS NEEDED FOR BLOOD PRESSURE 90 tablet 99  . temazepam (RESTORIL) 15 MG capsule TAKE 1 OR 2 CASPULES BY MOUTH FOR SLEEP IF NEEDED 30 capsule 5  . Vitamin D, Ergocalciferol, (DRISDOL) 50000 UNITS CAPS capsule Take 50,000 Units by mouth every Wednesday.      No current facility-administered medications on file prior to visit.   Allergies  Allergen Reactions  . Paxil [Paroxetine Hcl]    Past Medical History  Diagnosis Date  . COPD (chronic obstructive pulmonary disease)   . Hypertension   . Dyslipidemia   . Hard of hearing   . Hx of radiation therapy 06/18/13- 07/23/13    LUL lung primary, 7020 cGy 26 sessions  . 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 Skin cancer     basal cell  . Type II or unspecified type diabetes mellitus  without mention of complication, not stated as uncontrolled    Past Surgical History  Procedure Laterality Date  . Tonsillectomy      as child, repeat surgery early 20's  . Mohs surgery      "several times"  . Facial reconstruction surgery  25 years ago    to remove skin cancer, right side of face   History  Substance Use Topics  . Smoking status: Current Some Day Smoker -- 0.50 packs/day for 60 years    Types: Cigarettes  . Smokeless tobacco: Not on file     Comment: 06/05/13 currently 1/2 PPD, trying to quit  . Alcohol Use: Yes     Comment: occ wine, 2 drinks nightly    Family History  Problem Relation Age of Onset  . Hypertension    . Lung cancer Sister     tx w/radiation, met to stomach  . Pneumonia Mother     Deceased  . Heart attack Father     Deceased  . Cancer Sister     "back of neck"    Current Problems (verified) Patient Active Problem List   Diagnosis Date Noted  . PreDiabetes 09/30/2013  . Vitamin D Deficiency 09/30/2013  . Encounter for long-term (current) use of other medications 09/30/2013  . Insomnia secondary to anxiety 05/25/2013  . Bronchogenic carcinoma squamous cell type 05/14/2013  . HYPERLIPIDEMIA 06/21/2007  . TOBACCO ABUSE 06/21/2007  . DEPRESSION 06/21/2007  . HYPERTENSION 06/21/2007  . CHRONIC OBSTRUCTIVE PULMONARY DISEASE 06/21/2007  . Hypoxemia 06/21/2007    Screening Tests Health Maintenance  Topic Date Due  . OPHTHALMOLOGY EXAM  03/18/1945  . HEMOGLOBIN A1C  01/20/2015  . FOOT EXAM  04/01/2015  . URINE MICROALBUMIN  04/01/2015  . INFLUENZA VACCINE  04/04/2015  . COLONOSCOPY  03/03/2020  . TETANUS/TDAP  03/20/2022  . PNEUMOCOCCAL POLYSACCHARIDE VACCINE AGE 31 AND OVER  Completed  . ZOSTAVAX  Completed     Immunization History  Administered Date(s) Administered  . Influenza, High Dose Seasonal PF 07/22/2014  . Influenza,inj,Quad PF,36+ Mos 05/14/2013  . Pneumococcal Conjugate-13 04/29/2014  . Pneumococcal Polysaccharide-23 07/21/2009  . Td 03/20/2012  . Zoster 09/04/2009    Preventative care: Last colonoscopy:2011 Last mammogram: 04/2013 womens Last pap smear/pelvic exam: declines DEXA: 04/2013  Prior vaccinations: TD: 2013  Influenza: 2015  Pneumococcal: 2010/ 2015 Shingles/Zostavax: 2011  History reviewed: allergies, current medications, past family history, past medical history, past social history, past surgical history and problem list  Risk Factors: Osteoporosis: postmenopausal estrogen deficiency History of fracture in the past year: no  Tobacco History  Substance Use  Topics  . Smoking status: Current Some Day Smoker -- 0.50 packs/day for 60 years    Types: Cigarettes  . Smokeless tobacco: Not on file     Comment: 06/05/13 currently 1/2 PPD, trying to quit  . Alcohol Use: Yes     Comment: occ wine, 2 drinks nightly   She does not smoke.  Patient is a former smoker. Are there smokers in your home (other than you)?  No  Alcohol Current alcohol use: none  Caffeine Current caffeine use: coffee 1 /day  Exercise Exercise limitations: The patient is experiencing exercise intolerance (difficulty walking 5 feet on flat ground). Current exercise: housecleaning and no regular exercise  Nutrition/Diet Current diet: in general, a "healthy" diet    Cardiac risk factors: advanced age (older than 15 for men, 43 for women), dyslipidemia and sedentary lifestyle.  Depression Screen Nurse depression screen reviewed.  (Note: if  answer to either of the following is "Yes", a more complete depression screening is indicated)   Q1: Over the past two weeks, have you felt down, depressed or hopeless? No  Q2: Over the past two weeks, have you felt little interest or pleasure in doing things? No  Have you lost interest or pleasure in daily life? No  Do you often feel hopeless? No  Do you cry easily over simple problems? No  Activities of Daily Living Nurse ADLs screen reviewed.  In your present state of health, do you have any difficulty performing the following activities?:  Driving? yes Managing money?  No Feeding yourself? No Getting from bed to chair? No Climbing a flight of stairs? Yes Preparing food and eating?: No Bathing or showering? No Getting dressed: No Getting to the toilet? No Using the toilet:No Moving around from place to place: Yes In the past year have you fallen or had a near fall?:Yes   Are you sexually active?  No  Do you have more than one partner?  No  Vision Difficulties: No  Hearing Difficulties: No Do you often ask people to  speak up or repeat themselves? No Do you experience ringing or noises in your ears? No Do you have difficulty understanding soft or whispered voices? No  Cognition  Do you feel that you have a problem with memory?Yes  Do you often misplace items? Yes  Do you feel safe at home?  Yes  Advanced directives Does patient have a Health Care Power of Attorney? Yes Does patient have a Living Will? Yes    Objective:     Vision and hearing screens reviewed.   Blood pressure 120/64, pulse 72, temperature 98 F (36.7 C), temperature source Temporal, resp. rate 16, height 5\' 4"  (1.626 m), weight 139 lb (63.05 kg), SpO2 93 %. Body mass index is 23.85 kg/(m^2).  General appearance: alert, no distress, WD/WN,  female Cognitive Testing  Alert? Yes  Normal Appearance?Yes  Oriented to person? Yes  Place? Yes   Time? No  Recall of three objects?  No  Can perform simple calculations? Yes  Displays appropriate judgment?Yes  Can read the correct time from a watch face?Yes  HEENT: normocephalic, sclerae anicteric, TM right with scaring and old perforation, Left Tm not visualized with cerumen obstruction dark wax, nares patent, no discharge or erythema, pharynx normal Oral cavity: MMM, no lesions Neck: supple, no lymphadenopathy, no thyromegaly, no masses Heart: RRR, normal S1, S2, no murmurs Lungs: CTA bilaterally, no wheezes, rhonchi, or rales Abdomen: +bs, soft, non tender, non distended, no masses, no hepatomegaly, no splenomegaly Musculoskeletal: nontender, no swelling, no obvious deformity Extremities: no edema, no cyanosis, no clubbing Pulses: 2+ symmetric, upper and lower extremities, normal cap refill Neurological: alert, oriented x 3, CN2-12 intact, strength normal upper extremities and lower extremities, sensation normal throughout, DTRs 2+ throughout, no cerebellar signs, gait normal Psychiatric: normal affect, behavior normal, pleasant    Assessment:  1. Medicare wellness  update/CPE- Update screening labs/ History/ Immunizations/ Testing as needed. Advised healthy diet, QD exercise, increase H20 and continue RX/ Vitamins AD. Increase protein intake.  2. 3 month F/U for HTN, Cholesterol, Pre-Dm, D. Deficient. Needs healthy diet, cardio QD and obtain healthy weight. Check Labs, Check BP if >130/80 call office   3. Cerumen impaction- Advised of hygiene/ maintenance and NV for lavage, if no improvement at home.    4. Mild memory deficit discussed with patient and daughter will try to increase protein/ activity/ water intake  and monitor for any changes.  Plan:   During the course of the visit the patient was educated and counseled about appropriate screening and preventive services including:    Influenza vaccine  Nutrition counseling   Screening recommendations, referrals:  Vaccinations: Tdap vaccine no  Influenza vaccine yes Pneumococcal vaccine no Shingles vaccine no Hep B vaccine no  Nutrition assessed and recommended  Colonoscopy no Mammogram no Pap smear no Pelvic exam no Recommended yearly ophthalmology/optometry visit for glaucoma screening and checkup Recommended yearly dental visit for hygiene and checkup Advanced directives - no  Conditions/risks identified: BMI: Discussed weight loss, diet, and increase physical activity.  Increase physical activity: AHA recommends 150 minutes of physical activity a week.  Medications reviewed DEXA- not indicated Diabetes is at goal, ACE/ARB therapy: No, Reason not on Ace Inhibitor/ARB therapy:  predm Urinary Incontinence is not an issue: discussed non pharmacology and pharmacology options.  Fall risk: moderate- discussed PT, home fall assessment, medications.   Medicare Attestation I have personally reviewed: The patient's medical and social history Their use of alcohol, tobacco or illicit drugs Their current medications and supplements The patient's functional ability including ADLs,fall  risks, home safety risks, cognitive, and hearing and visual impairment Diet and physical activities Evidence for depression or mood disorders  The patient's weight, height, BMI, and visual acuity have been recorded in the chart.  I have made referrals, counseling, and provided education to the patient based on review of the above and I have provided the patient with a written personalized care plan for preventive services.     Hailey Keith, [place], [place], PA-C   07/25/2014   Other referrals - social services, PT, meals, transportation, nutrition therapy, weight loss, exercise, falls, tobacco, pap, pelvic CPT [phone] first AWV CPT [phone] subsequent AWV

## 2014-07-22 NOTE — Patient Instructions (Signed)
Insomnia Insomnia is frequent trouble falling and/or staying asleep. Insomnia can be a long term problem or a short term problem. Both are common. Insomnia can be a short term problem when the wakefulness is related to a certain stress or worry. Long term insomnia is often related to ongoing stress during waking hours and/or poor sleeping habits. Overtime, sleep deprivation itself can make the problem worse. Every little thing feels more severe because you are overtired and your ability to cope is decreased. CAUSES   Stress, anxiety, and depression.  Poor sleeping habits.  Distractions such as TV in the bedroom.  Naps close to bedtime.  Engaging in emotionally charged conversations before bed.  Technical reading before sleep.  Alcohol and other sedatives. They may make the problem worse. They can hurt normal sleep patterns and normal dream activity.  Stimulants such as caffeine for several hours prior to bedtime.  Pain syndromes and shortness of breath can cause insomnia.  Exercise late at night.  Changing time zones may cause sleeping problems (jet lag). It is sometimes helpful to have someone observe your sleeping patterns. They should look for periods of not breathing during the night (sleep apnea). They should also look to see how long those periods last. If you live alone or observers are uncertain, you can also be observed at a sleep clinic where your sleep patterns will be professionally monitored. Sleep apnea requires a checkup and treatment. Give your caregivers your medical history. Give your caregivers observations your family has made about your sleep.  SYMPTOMS   Not feeling rested in the morning.  Anxiety and restlessness at bedtime.  Difficulty falling and staying asleep. TREATMENT   Your caregiver may prescribe treatment for an underlying medical disorders. Your caregiver can give advice or help if you are using alcohol or other drugs for self-medication. Treatment  of underlying problems will usually eliminate insomnia problems.  Medications can be prescribed for short time use. They are generally not recommended for lengthy use.  Over-the-counter sleep medicines are not recommended for lengthy use. They can be habit forming.  You can promote easier sleeping by making lifestyle changes such as:  Using relaxation techniques that help with breathing and reduce muscle tension.  Exercising earlier in the day.  Changing your diet and the time of your last meal. No night time snacks.  Establish a regular time to go to bed.  Counseling can help with stressful problems and worry.  Soothing music and white noise may be helpful if there are background noises you cannot remove.  Stop tedious detailed work at least one hour before bedtime. HOME CARE INSTRUCTIONS   Keep a diary. Inform your caregiver about your progress. This includes any medication side effects. See your caregiver regularly. Take note of:  Times when you are asleep.  Times when you are awake during the night.  The quality of your sleep.  How you feel the next day. This information will help your caregiver care for you.  Get out of bed if you are still awake after 15 minutes. Read or do some quiet activity. Keep the lights down. Wait until you feel sleepy and go back to bed.  Keep regular sleeping and waking hours. Avoid naps.  Exercise regularly.  Avoid distractions at bedtime. Distractions include watching television or engaging in any intense or detailed activity like attempting to balance the household checkbook.  Develop a bedtime ritual. Keep a familiar routine of bathing, brushing your teeth, climbing into bed at the same   time each night, listening to soothing music. Routines increase the success of falling to sleep faster.  Use relaxation techniques. This can be using breathing and muscle tension release routines. It can also include visualizing peaceful scenes. You can  also help control troubling or intruding thoughts by keeping your mind occupied with boring or repetitive thoughts like the old concept of counting sheep. You can make it more creative like imagining planting one beautiful flower after another in your backyard garden.  During your day, work to eliminate stress. When this is not possible use some of the previous suggestions to help reduce the anxiety that accompanies stressful situations. MAKE SURE YOU:   Understand these instructions.  Will watch your condition.  Will get help right away if you are not doing well or get worse. Document Released: 08/17/2000 Document Revised: 11/12/2011 Document Reviewed: 09/17/2007 Exit[employer] Patient Information 2015 [employer]. This information is not intended to replace advice given to you by your health care provider. Make sure you discuss any questions you have with your health care provider.  

## 2014-07-23 LAB — CBC WITH DIFFERENTIAL/PLATELET
Basophils Absolute: 0 10*3/uL (ref 0.0–0.1)
Basophils Relative: 0 % (ref 0–1)
Eosinophils Absolute: 0.2 10*3/uL (ref 0.0–0.7)
Eosinophils Relative: 2 % (ref 0–5)
HEMATOCRIT: 44.1 % (ref 36.0–46.0)
Hemoglobin: 14 g/dL (ref 12.0–15.0)
LYMPHS PCT: 11 % — AB (ref 12–46)
Lymphs Abs: 1 10*3/uL (ref 0.7–4.0)
MCH: 29 pg (ref 26.0–34.0)
MCHC: 31.7 g/dL (ref 30.0–36.0)
MCV: 91.5 fL (ref 78.0–100.0)
MONO ABS: 0.9 10*3/uL (ref 0.1–1.0)
MONOS PCT: 10 % (ref 3–12)
MPV: 11 fL (ref 9.4–12.4)
NEUTROS ABS: 6.7 10*3/uL (ref 1.7–7.7)
Neutrophils Relative %: 77 % (ref 43–77)
Platelets: 240 10*3/uL (ref 150–400)
RBC: 4.82 MIL/uL (ref 3.87–5.11)
RDW: 14.8 % (ref 11.5–15.5)
WBC: 8.7 10*3/uL (ref 4.0–10.5)

## 2014-07-23 LAB — HEPATIC FUNCTION PANEL
ALBUMIN: 3.9 g/dL (ref 3.5–5.2)
ALT: 10 U/L (ref 0–35)
AST: 24 U/L (ref 0–37)
Alkaline Phosphatase: 81 U/L (ref 39–117)
BILIRUBIN TOTAL: 0.5 mg/dL (ref 0.2–1.2)
Bilirubin, Direct: 0.1 mg/dL (ref 0.0–0.3)
Indirect Bilirubin: 0.4 mg/dL (ref 0.2–1.2)
Total Protein: 6.1 g/dL (ref 6.0–8.3)

## 2014-07-23 LAB — BASIC METABOLIC PANEL WITH GFR
BUN: 9 mg/dL (ref 6–23)
CHLORIDE: 103 meq/L (ref 96–112)
CO2: 33 mEq/L — ABNORMAL HIGH (ref 19–32)
Calcium: 10 mg/dL (ref 8.4–10.5)
Creat: 0.8 mg/dL (ref 0.50–1.10)
GFR, Est African American: 82 mL/min
GFR, Est Non African American: 71 mL/min
GLUCOSE: 115 mg/dL — AB (ref 70–99)
POTASSIUM: 4.5 meq/L (ref 3.5–5.3)
Sodium: 141 mEq/L (ref 135–145)

## 2014-07-23 LAB — INSULIN, FASTING: INSULIN FASTING, SERUM: 5.8 u[IU]/mL (ref 2.0–19.6)

## 2014-07-23 LAB — LIPID PANEL
CHOL/HDL RATIO: 2.5 ratio
CHOLESTEROL: 163 mg/dL (ref 0–200)
HDL: 64 mg/dL (ref >39)
LDL Cholesterol: 83 mg/dL (ref 0–99)
Triglycerides: 79 mg/dL (ref <150)
VLDL: 16 mg/dL (ref 0–40)

## 2014-07-23 LAB — HEMOGLOBIN A1C
Hgb A1c MFr Bld: 5.6 % (ref <5.7)
Mean Plasma Glucose: 114 mg/dL (ref <117)

## 2014-09-25 ENCOUNTER — Other Ambulatory Visit: Payer: Self-pay | Admitting: Internal Medicine

## 2014-09-29 DIAGNOSIS — J449 Chronic obstructive pulmonary disease, unspecified: Secondary | ICD-10-CM | POA: Diagnosis not present

## 2014-09-30 ENCOUNTER — Other Ambulatory Visit: Payer: Self-pay | Admitting: Internal Medicine

## 2014-10-14 ENCOUNTER — Ambulatory Visit: Payer: Self-pay | Admitting: Internal Medicine

## 2014-10-30 DIAGNOSIS — J449 Chronic obstructive pulmonary disease, unspecified: Secondary | ICD-10-CM | POA: Diagnosis not present

## 2014-11-02 ENCOUNTER — Encounter: Payer: Self-pay | Admitting: Internal Medicine

## 2014-11-02 ENCOUNTER — Ambulatory Visit (INDEPENDENT_AMBULATORY_CARE_PROVIDER_SITE_OTHER): Payer: Medicare Other | Admitting: Internal Medicine

## 2014-11-02 VITALS — BP 116/62 | HR 76 | Temp 97.5°F | Resp 16 | Ht 64.0 in | Wt 132.4 lb

## 2014-11-02 DIAGNOSIS — M159 Polyosteoarthritis, unspecified: Secondary | ICD-10-CM

## 2014-11-02 DIAGNOSIS — E782 Mixed hyperlipidemia: Secondary | ICD-10-CM

## 2014-11-02 DIAGNOSIS — E559 Vitamin D deficiency, unspecified: Secondary | ICD-10-CM | POA: Diagnosis not present

## 2014-11-02 DIAGNOSIS — F329 Major depressive disorder, single episode, unspecified: Secondary | ICD-10-CM

## 2014-11-02 DIAGNOSIS — Z79899 Other long term (current) drug therapy: Secondary | ICD-10-CM

## 2014-11-02 DIAGNOSIS — R7309 Other abnormal glucose: Secondary | ICD-10-CM | POA: Diagnosis not present

## 2014-11-02 DIAGNOSIS — R7303 Prediabetes: Secondary | ICD-10-CM

## 2014-11-02 DIAGNOSIS — C3490 Malignant neoplasm of unspecified part of unspecified bronchus or lung: Secondary | ICD-10-CM

## 2014-11-02 DIAGNOSIS — I10 Essential (primary) hypertension: Secondary | ICD-10-CM

## 2014-11-02 DIAGNOSIS — Z1331 Encounter for screening for depression: Secondary | ICD-10-CM

## 2014-11-02 DIAGNOSIS — J441 Chronic obstructive pulmonary disease with (acute) exacerbation: Secondary | ICD-10-CM

## 2014-11-02 DIAGNOSIS — Z0000 Encounter for general adult medical examination without abnormal findings: Secondary | ICD-10-CM | POA: Diagnosis not present

## 2014-11-02 DIAGNOSIS — Z9181 History of falling: Secondary | ICD-10-CM

## 2014-11-02 DIAGNOSIS — M150 Primary generalized (osteo)arthritis: Secondary | ICD-10-CM

## 2014-11-02 DIAGNOSIS — F32A Depression, unspecified: Secondary | ICD-10-CM

## 2014-11-02 LAB — CBC WITH DIFFERENTIAL/PLATELET
Basophils Absolute: 0 10*3/uL (ref 0.0–0.1)
Basophils Relative: 0 % (ref 0–1)
EOS ABS: 0.2 10*3/uL (ref 0.0–0.7)
EOS PCT: 3 % (ref 0–5)
HCT: 43.6 % (ref 36.0–46.0)
Hemoglobin: 14.1 g/dL (ref 12.0–15.0)
LYMPHS ABS: 1.1 10*3/uL (ref 0.7–4.0)
LYMPHS PCT: 14 % (ref 12–46)
MCH: 29.7 pg (ref 26.0–34.0)
MCHC: 32.3 g/dL (ref 30.0–36.0)
MCV: 92 fL (ref 78.0–100.0)
MONO ABS: 0.8 10*3/uL (ref 0.1–1.0)
MPV: 11.3 fL (ref 8.6–12.4)
Monocytes Relative: 10 % (ref 3–12)
Neutro Abs: 5.9 10*3/uL (ref 1.7–7.7)
Neutrophils Relative %: 73 % (ref 43–77)
PLATELETS: 221 10*3/uL (ref 150–400)
RBC: 4.74 MIL/uL (ref 3.87–5.11)
RDW: 13.8 % (ref 11.5–15.5)
WBC: 8.1 10*3/uL (ref 4.0–10.5)

## 2014-11-02 LAB — HEMOGLOBIN A1C
HEMOGLOBIN A1C: 6.1 % — AB (ref <5.7)
Mean Plasma Glucose: 128 mg/dL — ABNORMAL HIGH (ref <117)

## 2014-11-02 MED ORDER — LEVOFLOXACIN 500 MG PO TABS: 500.0000 mg | ORAL_TABLET | Freq: Every day | ORAL | Status: DC

## 2014-11-02 MED ORDER — LEVOFLOXACIN 500 MG PO TABS: 500.0000 mg | ORAL_TABLET | Freq: Every day | ORAL | Status: AC

## 2014-11-02 MED ORDER — PREDNISONE 5 MG PO TABS: ORAL_TABLET | ORAL | Status: DC

## 2014-11-02 NOTE — Addendum Note (Signed)
Addended by: Unk Pinto on: 11/02/2014 10:12 PM   Modules accepted: Level of Service

## 2014-11-02 NOTE — Progress Notes (Signed)
Patient ID: Hailey Keith, female   DOB: Oct 05, 1934, 80 y.o.   MRN: [id number]  MEDICARE ANNUAL WELLNESS VISIT AND OV  Assessment:   1. Essential hypertension  - TSH  2. Mixed hyperlipidemia  - Lipid panel  3. Prediabetes  - Hemoglobin A1c - Insulin, fasting  4. Vitamin D deficiency  - Vit D  25 hydroxy (rtn osteoporosis monitoring)  5. Squamous cell carcinoma of bronchus, unspecified laterality   6. Medication management  - CBC with Differential/Platelet - BASIC METABOLIC PANEL WITH GFR - Hepatic function panel - Magnesium - Lipid panel  7. Bronchitis, chronic obstructive, with exacerbation  - levofloxacin (LEVAQUIN) 500 MG tablet; Take 1 tablet (500 mg total) by mouth daily. For lung infection  Dispense: 15 tablet; Refill: 1  8. Primary osteoarthritis involving multiple joints  - predniSONE (DELTASONE) 5 MG tablet; Take 1 tablet 3 x daily or as directed  Dispense: 100 tablet; Refill: 5   Plan:   During the course of the visit the patient was educated and counseled about appropriate screening and preventive services including:    Pneumococcal vaccine   Influenza vaccine  Td vaccine  Screening electrocardiogram  Bone densitometry screening  Colorectal cancer screening  Diabetes screening  Glaucoma screening  Nutrition counseling   Advanced directives: requested  Screening recommendations, referrals: Vaccinations:  Immunization History  Administered Date(s) Administered  . Influenza, High Dose Seasonal PF 07/22/2014  . Influenza,inj,Quad PF,36+ Mos 05/14/2013  . Pneumococcal Conjugate-13 04/29/2014  . Pneumococcal Polysaccharide-23 07/21/2009  . Td 03/20/2012  . Zoster 09/04/2009  Hep B vaccine not indicated  Nutrition assessed and recommended  Colonoscopy 2011 - Dr Penelope Coop Recommended yearly ophthalmology/optometry visit for glaucoma screening and checkup Recommended yearly dental visit for hygiene and checkup Advanced directives -  yes  Conditions/risks identified: BMI: Discussed weight loss, diet, and increase physical activity.  Increase physical activity: AHA recommends 150 minutes of physical activity a week.  Medications reviewed PreDiabetes is not at goal, ACE/ARB therapy: Yes - Los[place]in Urinary Incontinence is not an issue: discussed non pharmacology and pharmacology options.  Fall risk: low- discussed PT, home fall assessment, medications. Did have a fall 1 x due to losing balance and had no significant injury.  Subjective:   Hailey Keith  presents for [employer] Visit and OV.  Date of last medicare wellness visit was Nov 19,2015.  This very nice 80 y.o. DWF presents for follow up with Hypertension, Hyperlipidemia, Pre-Diabetes and Vitamin D Deficiency. Patient is treated for HTN since 1985 & BP has been controlled at home. Today's BP was  116/62 mmHg. Patient has had no complaints of any cardiac type chest pain, palpitations, dyspnea/orthopnea/PND, dizziness, claudication, or dependent edema. Patient does have significant hx/o tobacco related COPD and consequent squamous cell lung Ca dx'd in Aug 2014 and tx'd by curative Radiotherapy completed Nov 2014 and is followed closely by Dr Julien Nordmann. Tragically, patient continues to smoke ~ 1/2 PPD. [name]; [name]; [name]; Trig 79 on 07/22/2014.   Also, [name], diabetic polys, paresthesias or visual blurring.  Last A1c was  5.6% on 07/22/2014.   Further, the patient also has history of Vitamin D Deficiency of  18 in 2008 and supplements vitamin D without any suspected side-effects. Last vitamin D was 103 on 03/31/2014.  Names of Other  Physician/Practitioners you currently use: 1. [facility] here for primary care 2. No, eye doctor, [name], [name], [name], [name], [name], [name], [name], [name], [name], MD as Attending Physician (Radiation Oncology)  Medication Review: Medication Sig  . albuterol  HFA  inhaler 1 to 2 inhalations every 4 hours as needed for Rescue Asthma  . ALPRAZolam (XANAX) 0.5 MG tablet Take 0.5 mg by mouth 3 (three) times daily as needed. For anxiety  . aspirin 81 MG tablet Take 81 mg by mouth daily.  Marland Kitchen atorvastatin (LIPITOR) 80 MG tablet Take 80 mg by mouth. On Sunday and Wednesday  . citalopram (CELEXA) 40 MG tablet TAKE 1 TABLET BY MOUTH DAILY FOR MOOD  . doxazosin (CARDURA) 8 MG tablet TAKE 1 TABLET AT BEDTIME  . guaifenesin (HUMIBID E) 400 MG TABS  Take 400 mg by mouth 2 (two) times daily.  Marland Kitchen ipratropium-albuterol (DUONEB)  Take 3 mLs by nebulization 4 (four) times daily.  Marland Kitchen losartan (COZAAR) 100 MG tablet TAKE 1 TABLET BY MOUTH DAILY AS NEEDED FOR BLOOD PRESSURE  . temazepam (RESTORIL) 15 MG capsule TAKE 1 OR 2 CASPULES BY MOUTH FOR SLEEP IF NEEDED  . Vitamin D (DRISDOL) 50,000 UNITS CAPS Take 50,000 Units by mouth every Wednesday.    Current Problems (verified) Patient Active Problem List   Diagnosis Date Noted  . Vitamin D deficiency 09/30/2013    Priority: Medium  . Essential hypertension 06/21/2007    Priority: Medium  . Prediabetes 09/30/2013  . Medication management 09/30/2013  . Insomnia secondary to  anxiety 05/25/2013  . Bronchogenic carcinoma squamous cell type 05/14/2013  . Mixed hyperlipidemia 06/21/2007  . TOBACCO ABUSE 06/21/2007  . DEPRESSION 06/21/2007  . CHRONIC OBSTRUCTIVE PULMONARY DISEASE 06/21/2007  . Hypoxemia 06/21/2007   Screening Tests Health Maintenance  Topic Date Due  . OPHTHALMOLOGY EXAM  03/18/1945  . HEMOGLOBIN A1C  01/20/2015  . FOOT EXAM  04/01/2015  . URINE MICROALBUMIN  04/01/2015  . INFLUENZA VACCINE  04/04/2015  . COLONOSCOPY  03/03/2020  . TETANUS/TDAP  03/20/2022  . DEXA SCAN  Completed  . PNEUMOCOCCAL POLYSACCHARIDE VACCINE AGE 56 AND OVER  Completed  . ZOSTAVAX  Completed    Immunization History  Administered Date(s) Administered  . Influenza, High Dose Seasonal PF 07/22/2014  . Influenza,inj,Quad PF,36+ Mos 05/14/2013  . Pneumococcal Conjugate-13 04/29/2014  . Pneumococcal Polysaccharide-23 07/21/2009  . Td 03/20/2012  . Zoster 09/04/2009    Preventative care: Last colonoscopy: 2011 - Dr Penelope Coop  History reviewed: allergies, current medications, past family history, past medical history, past social history, past surgical history and problem list  Risk Factors: Tobacco History  Substance Use Topics  . Smoking status: Current Some Day Smoker -- 0.50 packs/day for 60 years    Types: Cigarettes  . Smokeless tobacco: Not on file     Comment: 06/05/13 currently 1/2 PPD, trying to quit  . Alcohol Use: Yes     Comment: occ wine, 2 drinks nightly   She  does smoke.  Are there smokers in your home (other than you)?  No Alcohol Current alcohol use: rarely  Caffeine Current caffeine use: coffee 2 cups /day  Exercise Current exercise: none  Nutrition/Diet Current diet: in general, a "healthy" diet    Cardiac risk factors: advanced age (older than 60 for men, 73 for women), dyslipidemia, hypertension, sedentary lifestyle and smoking/ tobacco exposure.  Depression Screen (Note: if answer to either of the following is "Yes", a more  complete depression screening is indicated)   Q1: Over the past two weeks, have you felt down, depressed or hopeless? No  Q2: Over the past two weeks, have you felt little interest or pleasure in doing things? No  Have you lost interest or pleasure in daily life? No  Do you often feel hopeless? No  Do you cry easily over simple problems? No  Activities of Daily Living In your present state of health, do you have any difficulty performing the following activities?:  Driving? No Managing money?  No Feeding yourself? No Getting from bed to chair? No Climbing a flight of stairs? No Preparing food and eating?: No Bathing or showering? No Getting dressed: No Getting to the toilet? No Using the toilet:No Moving around from place to place: No In the past year have you fallen or had a near fall?:No   Are you sexually active?  No  Do you have more than one partner?  No  Vision Difficulties: No  Hearing Difficulties: No Do you often ask people to speak up or repeat themselves? No Do you experience ringing or noises in your ears? No Do you have difficulty understanding soft or whispered voices? Sometimes.  Cognition  Do you feel that you have a problem with memory?No  Do you often misplace items? No  Do you feel safe at home?  Yes  Advanced directives Does patient have a Wheeler? Yes Does patient have a Living Will? Yes  Past Medical History  Diagnosis Date  . COPD (chronic obstructive pulmonary disease)   . Hypertension   . Dyslipidemia   . Hard of hearing   . Hx of radiation therapy 06/18/13- 07/23/13    LUL lung primary, 7020 cGy 26 sessions  . 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 Skin cancer     basal cell  . Type II or unspecified type diabetes mellitus without mention of complication, not stated as uncontrolled    Past Surgical History  Procedure Laterality Date  . Tonsillectomy      as  child, repeat surgery early 20's  . Mohs surgery      "several times"  . Facial reconstruction surgery  25 years ago    to remove skin cancer, right side of face   ROS: Constitutional: Denies fever, chills, weight loss/gain, headaches, insomnia, fatigue, night sweats, and change in appetite. Eyes: Denies redness, blurred vision, diplopia, discharge, itchy, watery eyes.  ENT: Denies discharge, congestion, post nasal drip, epistaxis, sore throat, earache, hearing loss, dental pain, Tinnitus, Vertigo, Sinus pain, snoring.  Cardio: Denies chest pain, palpitations, irregular heartbeat, syncope, dyspnea, diaphoresis, orthopnea, PND, claudication, edema Respiratory: denies cough, dyspnea, DOE, pleurisy, hoarseness, laryngitis, wheezing.  Gastrointestinal: Denies dysphagia, heartburn, reflux, water brash, pain, cramps, nausea, vomiting, bloating, diarrhea, constipation, hematemesis, melena, hematochezia, jaundice, hemorrhoids Genitourinary: Denies dysuria, frequency, urgency, nocturia, hesitancy, discharge, hematuria, flank pain Breast: Breast lumps, nipple discharge, bleeding.  Musculoskeletal:  c/o multiple near disabling arthralgias in "all" extremity joints. Denies falls. Skin: Denies puritis, rash, hives, warts, acne, eczema, changing in skin lesion Neuro: No weakness, tremor, incoordination, spasms, paresthesia, pain Psychiatric: Denies confusion, memory loss, sensory loss. Denies Depression. Endocrine: Denies change in weight, skin, hair change, nocturia, and paresthesia, diabetic polys, visual blurring, hyper / hypo glycemic episodes.  Heme/Lymph: No excessive bleeding, bruising, enlarged lymph nodes  Objective:     BP 116/62 mmHg  Pulse 76  Temp(Src) 97.5 F (36.4 C)  Resp 16  Ht 5\' 4"  (1.626 m)  Wt 132 lb 6.4 oz (60.056 kg)  BMI 22.72 kg/m2  General Appearance: Well nourished, alert, WD/WN, femaleand in no apparent distress. Eyes: PERRLA, EOMs, conjunctiva no swelling or erythema,  normal fundi and vessels. Sinuses: No frontal/maxillary tenderness ENT/Mouth: EACs patent / TMs  nl. Nares clear without erythema, swelling, mucoid exudates. Oral hygiene is good. No erythema, swelling, or exudate. Tongue normal, non-obstructing. Tonsils not swollen or erythematous. Hearing normal.  Neck: Supple, thyroid normal. No bruits, nodes or JVD. Respiratory: Respiratory effort normal.  BS equal and clear bilateral without rales, rhonci, wheezing or stridor. Cardio: Heart sounds are normal with regular rate and rhythm and no murmurs, rubs or gallops. Peripheral pulses are normal and equal bilaterally without edema. No aortic or femoral bruits. Chest: symmetric with normal excursions and percussion. Breasts: Symmetric, without lumps, nipple discharge, retractions, or fibrocystic changes.  Abdomen: Flat, soft  with nl bowel sounds. Nontender, no guarding, rebound, hernias, masses, or organomegaly.  Lymphatics: Non tender without lymphadenopathy.  Genitourinary:  Musculoskeletal: Full ROM all peripheral extremities, joint stability, 5/5 strength, and normal gait. Skin: Warm and dry without rashes, lesions, cyanosis, clubbing or  ecchymosis.  Neuro: Cranial nerves intact, reflexes equal bilaterally. Normal muscle tone, no cerebellar symptoms. Sensation intact.  Pysch: Awake and oriented X 3, normal affect, Insight and Judgment appropriate.   Cognitive Testing  Alert? Yes  Normal Appearance?Yes  Oriented to person? Yes  Place? Yes   Time? Yes  Recall of three objects?  Yes  Can perform simple calculations? Yes  Displays appropriate judgment? Yes  Can read the correct time from a watch/clock?Yes  Medicare Attestation I have personally reviewed: The patient's medical and social history Their use of alcohol, tobacco or illicit drugs Their current medications and supplements The patient's functional ability including ADLs,fall risks, home safety risks, cognitive, and hearing and visual  impairment Diet and physical activities Evidence for depression or mood disorders  The patient's weight, height, BMI, and visual acuity have been recorded in the chart. I have made referrals, counseling, and provided education to the patient based on review of the above and I have provided the patient with a written personalized care plan for preventive services.

## 2014-11-02 NOTE — Patient Instructions (Signed)

## 2014-11-03 LAB — BASIC METABOLIC PANEL WITH GFR
BUN: 12 mg/dL (ref 6–23)
CHLORIDE: 101 meq/L (ref 96–112)
CO2: 32 meq/L (ref 19–32)
CREATININE: 0.76 mg/dL (ref 0.50–1.10)
Calcium: 10.1 mg/dL (ref 8.4–10.5)
GFR, Est African American: 88 mL/min
GFR, Est Non African American: 76 mL/min
Glucose, Bld: 175 mg/dL — ABNORMAL HIGH (ref 70–99)
Potassium: 4 mEq/L (ref 3.5–5.3)
Sodium: 142 mEq/L (ref 135–145)

## 2014-11-03 LAB — HEPATIC FUNCTION PANEL
ALBUMIN: 4 g/dL (ref 3.5–5.2)
ALK PHOS: 76 U/L (ref 39–117)
ALT: 15 U/L (ref 0–35)
AST: 22 U/L (ref 0–37)
BILIRUBIN INDIRECT: 0.4 mg/dL (ref 0.2–1.2)
Bilirubin, Direct: 0.1 mg/dL (ref 0.0–0.3)
Total Bilirubin: 0.5 mg/dL (ref 0.2–1.2)
Total Protein: 6.1 g/dL (ref 6.0–8.3)

## 2014-11-03 LAB — LIPID PANEL
Cholesterol: 164 mg/dL (ref 0–200)
HDL: 62 mg/dL (ref >=46)
LDL CALC: 87 mg/dL (ref 0–99)
Total CHOL/HDL Ratio: 2.6 Ratio
Triglycerides: 74 mg/dL (ref <150)
VLDL: 15 mg/dL (ref 0–40)

## 2014-11-03 LAB — VITAMIN D 25 HYDROXY (VIT D DEFICIENCY, FRACTURES): Vit D, 25-Hydroxy: 89 ng/mL (ref 30–100)

## 2014-11-03 LAB — TSH: TSH: 1.905 u[IU]/mL (ref 0.350–4.500)

## 2014-11-03 LAB — MAGNESIUM: MAGNESIUM: 1.8 mg/dL (ref 1.5–2.5)

## 2014-11-03 LAB — INSULIN, FASTING: Insulin fasting, serum: 22.4 u[IU]/mL — ABNORMAL HIGH (ref 2.0–19.6)

## 2014-11-08 DIAGNOSIS — H1013 Acute atopic conjunctivitis, bilateral: Secondary | ICD-10-CM | POA: Diagnosis not present

## 2014-11-08 DIAGNOSIS — H0015 Chalazion left lower eyelid: Secondary | ICD-10-CM | POA: Diagnosis not present

## 2014-11-23 ENCOUNTER — Ambulatory Visit (HOSPITAL_COMMUNITY)
Admission: RE | Admit: 2014-11-23 | Discharge: 2014-11-23 | Disposition: A | Discharge status: Home / Self Care | Payer: Medicare Other | Source: Ambulatory Visit | Attending: Internal Medicine | Admitting: Internal Medicine

## 2014-11-23 ENCOUNTER — Encounter (HOSPITAL_COMMUNITY): Payer: Self-pay

## 2014-11-23 ENCOUNTER — Other Ambulatory Visit (HOSPITAL_BASED_OUTPATIENT_CLINIC_OR_DEPARTMENT_OTHER): Payer: Medicare Other

## 2014-11-23 DIAGNOSIS — Z85118 Personal history of other malignant neoplasm of bronchus and lung: Secondary | ICD-10-CM | POA: Diagnosis not present

## 2014-11-23 DIAGNOSIS — Z923 Personal history of irradiation: Secondary | ICD-10-CM | POA: Insufficient documentation

## 2014-11-23 DIAGNOSIS — J439 Emphysema, unspecified: Secondary | ICD-10-CM | POA: Diagnosis not present

## 2014-11-23 DIAGNOSIS — C3490 Malignant neoplasm of unspecified part of unspecified bronchus or lung: Secondary | ICD-10-CM | POA: Insufficient documentation

## 2014-11-23 DIAGNOSIS — Z720 Tobacco use: Secondary | ICD-10-CM | POA: Diagnosis not present

## 2014-11-23 DIAGNOSIS — C3410 Malignant neoplasm of upper lobe, unspecified bronchus or lung: Secondary | ICD-10-CM | POA: Diagnosis not present

## 2014-11-23 DIAGNOSIS — R918 Other nonspecific abnormal finding of lung field: Secondary | ICD-10-CM | POA: Diagnosis not present

## 2014-11-23 DIAGNOSIS — I2510 Atherosclerotic heart disease of native coronary artery without angina pectoris: Secondary | ICD-10-CM | POA: Diagnosis not present

## 2014-11-23 LAB — CBC WITH DIFFERENTIAL/PLATELET
BASO%: 0.3 % (ref 0.0–2.0)
BASOS ABS: 0 10*3/uL (ref 0.0–0.1)
EOS%: 3.3 % (ref 0.0–7.0)
Eosinophils Absolute: 0.4 10*3/uL (ref 0.0–0.5)
HEMATOCRIT: 44.2 % (ref 34.8–46.6)
HEMOGLOBIN: 14.3 g/dL (ref 11.6–15.9)
LYMPH%: 17.3 % (ref 14.0–49.7)
MCH: 30 pg (ref 25.1–34.0)
MCHC: 32.4 g/dL (ref 31.5–36.0)
MCV: 92.7 fL (ref 79.5–101.0)
MONO#: 1.2 10*3/uL — ABNORMAL HIGH (ref 0.1–0.9)
MONO%: 9.9 % (ref 0.0–14.0)
NEUT#: 8.1 10*3/uL — ABNORMAL HIGH (ref 1.5–6.5)
NEUT%: 69.2 % (ref 38.4–76.8)
Platelets: 210 10*3/uL (ref 145–400)
RBC: 4.77 10*6/uL (ref 3.70–5.45)
RDW: 14.1 % (ref 11.2–14.5)
WBC: 11.8 10*3/uL — AB (ref 3.9–10.3)
lymph#: 2 10*3/uL (ref 0.9–3.3)

## 2014-11-23 LAB — COMPREHENSIVE METABOLIC PANEL (CC13)
ALBUMIN: 3.6 g/dL (ref 3.5–5.0)
ALT: 18 U/L (ref 0–55)
AST: 24 U/L (ref 5–34)
Alkaline Phosphatase: 72 U/L (ref 40–150)
Anion Gap: 11 mEq/L (ref 3–11)
BUN: 15.4 mg/dL (ref 7.0–26.0)
CALCIUM: 9.7 mg/dL (ref 8.4–10.4)
CHLORIDE: 104 meq/L (ref 98–109)
CO2: 28 mEq/L (ref 22–29)
Creatinine: 0.7 mg/dL (ref 0.6–1.1)
EGFR: 84 mL/min/{1.73_m2} — ABNORMAL LOW (ref >90)
Glucose: 89 mg/dl (ref 70–140)
POTASSIUM: 4.8 meq/L (ref 3.5–5.1)
Sodium: 143 mEq/L (ref 136–145)
Total Bilirubin: 0.48 mg/dL (ref 0.20–1.20)
Total Protein: 6.3 g/dL — ABNORMAL LOW (ref 6.4–8.3)

## 2014-11-23 MED ORDER — IOHEXOL 300 MG/ML  SOLN
80.0000 mL | Freq: Once | INTRAMUSCULAR | Status: AC | PRN
  Administered 2014-11-23: 80 mL via INTRAVENOUS

## 2014-11-28 DIAGNOSIS — J449 Chronic obstructive pulmonary disease, unspecified: Secondary | ICD-10-CM | POA: Diagnosis not present

## 2014-11-30 ENCOUNTER — Ambulatory Visit (HOSPITAL_BASED_OUTPATIENT_CLINIC_OR_DEPARTMENT_OTHER): Payer: Medicare Other | Admitting: Internal Medicine

## 2014-11-30 ENCOUNTER — Telehealth: Payer: Self-pay | Admitting: Internal Medicine

## 2014-11-30 ENCOUNTER — Encounter: Payer: Self-pay | Admitting: Internal Medicine

## 2014-11-30 VITALS — BP 133/67 | HR 82 | Temp 98.6°F | Resp 18 | Ht 64.0 in | Wt 132.5 lb

## 2014-11-30 DIAGNOSIS — C3432 Malignant neoplasm of lower lobe, left bronchus or lung: Secondary | ICD-10-CM

## 2014-11-30 DIAGNOSIS — C3412 Malignant neoplasm of upper lobe, left bronchus or lung: Secondary | ICD-10-CM

## 2014-11-30 NOTE — Telephone Encounter (Signed)
Gave avs & calendar for September °

## 2014-11-30 NOTE — Progress Notes (Signed)
[facility] Telephone:([phone]) [phone]   Fax:([phone]) [place], MD [address] [place] [address]  DIAGNOSIS: Stage IIB (T3, N0, M0) non-small cell lung cancer, squamous cell carcinoma of the left upper lung diagnosed in August of 2014.  PRIOR THERAPY: Curative radiotherapy to the left upper lobe lung mass under the care of Dr. Valere Dross completed on 07/23/2013.  CURRENT THERAPY: Observation.  INTERVAL HISTORY: Hailey Keith 80 y.o. female returns to the clinic today for followup visit accompanied by her daughter. The patient is doing very well today with no specific complaints except for the baseline shortness of breath increased with exertion. She denied having any significant chest pain , cough or hemoptysis. She denied having any fever or chills, no nausea or vomiting. She had repeat CT scan of the chest performed recently and she is here for evaluation and discussion of her scan results.  MEDICAL HISTORY: Past Medical History  Diagnosis Date  . COPD (chronic obstructive pulmonary disease)   . Hypertension   . Dyslipidemia   . Hard of hearing   . Hx of radiation therapy 06/18/13- 07/23/13    LUL lung primary, 7020 cGy 26 sessions  . 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 Skin cancer     basal cell  . Type II or unspecified type diabetes mellitus without mention of complication, not stated as uncontrolled     ALLERGIES:  is allergic to paxil.  MEDICATIONS:  Current Outpatient Prescriptions  Medication Sig Dispense Refill  . ALPRAZolam (XANAX) 0.5 MG tablet Take 0.5 mg by mouth 3 (three) times daily as needed. For anxiety    . atorvastatin (LIPITOR) 80 MG tablet Take 80 mg by mouth. On Sunday and Wednesday    . citalopram (CELEXA) 40 MG tablet TAKE 1 TABLET BY MOUTH DAILY FOR MOOD 90 tablet 2  . doxazosin (CARDURA) 8 MG tablet TAKE 1  TABLET AT BEDTIME 90 tablet 0  . guaifenesin (HUMIBID E) 400 MG TABS tablet Take 400 mg by mouth 2 (two) times daily.    Marland Kitchen ipratropium-albuterol (DUONEB) 0.5-2.5 (3) MG/3ML SOLN Take 3 mLs by nebulization 4 (four) times daily. 360 mL 99  . losartan (COZAAR) 100 MG tablet TAKE 1 TABLET BY MOUTH DAILY AS NEEDED FOR BLOOD PRESSURE 90 tablet 99  . predniSONE (DELTASONE) 5 MG tablet Take 1 tablet 3 x daily or as directed 100 tablet 5  . Vitamin D, Ergocalciferol, (DRISDOL) 50000 UNITS CAPS capsule Take 50,000 Units by mouth every Wednesday.     Marland Kitchen albuterol (PROVENTIL HFA;VENTOLIN HFA) 108 (90 BASE) MCG/ACT inhaler 1 to 2 inhalations every 4 hours as needed for Rescue Asthma (Patient not taking: Reported on 11/30/2014) 1 Inhaler 99  . aspirin 81 MG tablet Take 81 mg by mouth daily.    . temazepam (RESTORIL) 15 MG capsule TAKE 1 OR 2 CASPULES BY MOUTH FOR SLEEP IF NEEDED (Patient not taking: Reported on 11/30/2014) 30 capsule 5   No current facility-administered medications for this visit.    SURGICAL HISTORY:  Past Surgical History  Procedure Laterality Date  . Tonsillectomy      as child, repeat surgery early 20's  . Mohs surgery      "several times"  . Facial reconstruction surgery  25 years ago    to remove skin cancer, right side of face  REVIEW OF SYSTEMS:  A comprehensive review of systems was negative except for: Respiratory: positive for dyspnea on exertion   PHYSICAL EXAMINATION: General appearance: alert, cooperative and no distress Head: Normocephalic, without obvious abnormality, atraumatic Neck: no adenopathy, no JVD, supple, symmetrical, trachea midline and thyroid not enlarged, symmetric, no tenderness/mass/nodules Lymph nodes: Cervical, supraclavicular, and axillary nodes normal. Resp: clear to auscultation bilaterally Back: symmetric, no curvature. ROM normal. No CVA tenderness. Cardio: regular rate and rhythm, S1, S2 normal, no murmur, click, rub or gallop GI: soft,  non-tender; bowel sounds normal; no masses,  no organomegaly Extremities: extremities normal, atraumatic, no cyanosis or edema  ECOG PERFORMANCE STATUS: 2 - Symptomatic, <50% confined to bed  Blood pressure 133/67, pulse 82, temperature 98.6 F (37 C), temperature source Oral, resp. rate 18, height 5\' 4"  (1.626 m), weight 132 lb 8 oz (60.102 kg), SpO2 96 %.  LABORATORY DATA: Lab Results  Component Value Date   WBC 11.8* 11/23/2014   HGB 14.3 11/23/2014   HCT 44.2 11/23/2014   MCV 92.7 11/23/2014   PLT 210 11/23/2014      Chemistry      Component Value Date/Time   NA 143 11/23/2014 1441   NA 142 11/02/2014 1203   K 4.8 11/23/2014 1441   K 4.0 11/02/2014 1203   CL 101 11/02/2014 1203   CO2 28 11/23/2014 1441   CO2 32 11/02/2014 1203   BUN 15.4 11/23/2014 1441   BUN 12 11/02/2014 1203   CREATININE 0.7 11/23/2014 1441   CREATININE 0.76 11/02/2014 1203   CREATININE 0.7 05/14/2013 1717      Component Value Date/Time   CALCIUM 9.7 11/23/2014 1441   CALCIUM 10.1 11/02/2014 1203   ALKPHOS 72 11/23/2014 1441   ALKPHOS 76 11/02/2014 1203   AST 24 11/23/2014 1441   AST 22 11/02/2014 1203   ALT 18 11/23/2014 1441   ALT 15 11/02/2014 1203   BILITOT 0.48 11/23/2014 1441   BILITOT 0.5 11/02/2014 1203       RADIOGRAPHIC STUDIES: Ct Chest W Contrast  11/23/2014   CLINICAL DATA:  Subsequent evaluation of 80 year old female with history of left upper lobe squamous cell carcinoma previously treated with radiation therapy.  EXAM: CT CHEST WITH CONTRAST  TECHNIQUE: Multidetector CT imaging of the chest was performed during intravenous contrast administration.  CONTRAST:  80mL OMNIPAQUE IOHEXOL 300 MG/ML  SOLN  COMPARISON:  Chest CT 05/20/2014.  FINDINGS: Mediastinum/Lymph Nodes: Heart size is mildly enlarged. There is no significant pericardial fluid, thickening or pericardial calcification. There is atherosclerosis of the thoracic aorta, the great vessels of the mediastinum and the  coronary arteries, including calcified atherosclerotic plaque in the left main, left anterior descending, left circumflex and right coronary arteries. Severe mitral annular calcifications. Mild aortic valve calcifications. No pathologically enlarged mediastinal or hilar lymph nodes. Small hiatal hernia. No axillary lymphadenopathy.  Lungs/Pleura: Within the area of architectural distortion in the left upper lobe (site of prior radiation therapy), the dominant spiculated nodule on the prior examination is far less apparent on today's study, currently an 11 x 6 mm macrolobulated nodule (image 19 of series 5), decreased from 2.0 x 0.7 cm on the prior examination. Other previously noted 9 mm spiculated pleural-based nodule in the posterior aspect of the left upper lobe is unchanged (image 15 of series 5). Nodular area of apparent scarring in the medial segment of the right middle lobe is stable compared to prior examinations measuring 16 x 18 mm on today's study (image 42 of  series 5). Several other smaller nodules are also noted in the lungs bilaterally, largest of which is a 7 x 8 mm left lower lobe nodule (image 33 of series 5), which is similar compared to the prior examination. There is a new pleural-based nodular opacity that is sessile in appearance measuring 12 x 6 mm in the posterior aspect of the left lower lobe (image 30 of series 5), which may be infectious or inflammatory, but is nonspecific. In addition, there is diffuse bronchial wall thickening with moderate to severe centrilobular and paraseptal emphysema, most apparent in the lung apices. Widespread mild cylindrical bronchiectasis with patchy areas of thickening of the peribronchovascular interstitium, and extensive subpleural reticulation, most evident in the lower lobes of the lungs bilaterally. In the lower lobes of the lungs there are several areas of potential early honeycombing. No pleural effusions.  Upper Abdomen: Extensive atherosclerosis.   Musculoskeletal/Soft Tissues: There are no aggressive appearing lytic or blastic lesions noted in the visualized portions of the skeleton.  IMPRESSION: 1. Today's examination demonstrates further regression of the treated left upper lobe pulmonary nodule, which currently measures only 11 x 6 mm. 2. Other previously noted pulmonary nodules are generally stable in size and appearance compared to the prior examination. The notable exception is a new pleural-based sessile nodule in the posterior aspect of the left lower lobe (image 30 of series 5) which measures 12 x 6 mm. The appearance of this nodule favors a focus of infection/inflammation, however, close attention at time of future followup examination is recommended in 3-6 months. 3. Mild diffuse bronchial wall thickening with moderate to severe centrilobular and paraseptal emphysema. 4. In addition, there is a spectrum of findings suggestive of interstitial lung disease, including areas of potential early honeycombing, as detailed above. This appears slightly progressive compared to prior examinations dating back to 04/29/2013. The possibility of developing usual interstitial pneumonia (UIP) should be considered, and close attention on future followup examinations is recommended. If there is clinical concern for interstitial lung disease, referral to Pulmonology for further evaluation may be warranted. 5. Atherosclerosis, including left main and 3 vessel coronary artery disease. Assessment for potential risk factor modification, dietary therapy or pharmacologic therapy may be warranted, if clinically indicated.   Electronically Signed   By: Vinnie Langton M.D.   On: 11/23/2014 17:21    ASSESSMENT AND PLAN: This is a very pleasant 80 years old white female with history of stage IIB non-small cell lung cancer, squamous cell carcinoma status post curative radiotherapy to the left upper lobe lung mass with partial response. The recent CT scan of the chest showed  a stable disease with a stable bilateral pulmonary nodules and a questionable inflammatory nodule in the posterior aspect of the left lower lobe. I discussed the scan results with the patient and her daughter.  I recommended for her to continue on observation for now with repeat CT scan of the chest in 6 months. She was advised to call immediately if she has any concerning symptoms in the interval. The patient voices understanding of current disease status and treatment options and is in agreement with the current care plan.  All questions were answered. The patient knows to call the clinic with any problems, questions or concerns. We can certainly see the patient much sooner if necessary.  Disclaimer: This note was dictated with voice recognition software. Similar sounding words can inadvertently be transcribed and may not be corrected upon review.

## 2014-11-30 NOTE — Patient Instructions (Signed)
Smoking Cessation, Tips for Success  If you are ready to quit smoking, congratulations! You have chosen to help yourself be healthier. Cigarettes bring nicotine, tar, carbon monoxide, and other irritants into your body. Your lungs, heart, and blood vessels will be able to work better without these poisons. There are many different ways to quit smoking. Nicotine gum, nicotine patches, a nicotine inhaler, or nicotine nasal spray can help with physical craving. Hypnosis, support groups, and medicines help break the habit of smoking.  WHAT THINGS CAN I DO TO MAKE QUITTING EASIER?   Here are some tips to help you quit for good:  · Pick a date when you will quit smoking completely. Tell all of your friends and family about your plan to quit on that date.  · Do not try to slowly cut down on the number of cigarettes you are smoking. Pick a quit date and quit smoking completely starting on that day.  · Throw away all cigarettes.    · Clean and remove all ashtrays from your home, work, and car.  · On a card, write down your reasons for quitting. Carry the card with you and read it when you get the urge to smoke.  · Cleanse your body of nicotine. Drink enough water and fluids to keep your urine clear or pale yellow. Do this after quitting to flush the nicotine from your body.  · Learn to predict your moods. Do not let a bad situation be your excuse to have a cigarette. Some situations in your life might tempt you into wanting a cigarette.  · Never have "just one" cigarette. It leads to wanting another and another. Remind yourself of your decision to quit.  · Change habits associated with smoking. If you smoked while driving or when feeling stressed, try other activities to replace smoking. Stand up when drinking your coffee. Brush your teeth after eating. Sit in a different chair when you read the paper. Avoid alcohol while trying to quit, and try to drink fewer caffeinated beverages. Alcohol and caffeine may urge you to  smoke.  · Avoid foods and drinks that can trigger a desire to smoke, such as sugary or spicy foods and alcohol.  · Ask people who smoke not to smoke around you.  · Have something planned to do right after eating or having a cup of coffee. For example, plan to take a walk or exercise.  · Try a relaxation exercise to calm you down and decrease your stress. Remember, you may be tense and nervous for the first 2 weeks after you quit, but this will pass.  · Find new activities to keep your hands busy. Play with a pen, coin, or rubber band. Doodle or draw things on paper.  · Brush your teeth right after eating. This will help cut down on the craving for the taste of tobacco after meals. You can also try mouthwash.    · Use oral substitutes in place of cigarettes. Try using lemon drops, carrots, cinnamon sticks, or chewing gum. Keep them handy so they are available when you have the urge to smoke.  · When you have the urge to smoke, try deep breathing.  · Designate your home as a nonsmoking area.  · If you are a heavy smoker, ask your health care provider about a prescription for nicotine chewing gum. It can ease your withdrawal from nicotine.  · Reward yourself. Set aside the cigarette money you save and buy yourself something nice.  · Look for   support from others. Join a support group or smoking cessation program. Ask someone at home or at work to help you with your plan to quit smoking.  · Always ask yourself, "Do I need this cigarette or is this just a reflex?" Tell yourself, "Today, I choose not to smoke," or "I do not want to smoke." You are reminding yourself of your decision to quit.  · Do not replace cigarette smoking with electronic cigarettes (commonly called e-cigarettes). The safety of e-cigarettes is unknown, and some may contain harmful chemicals.  · If you relapse, do not give up! Plan ahead and think about what you will do the next time you get the urge to smoke.  HOW WILL I FEEL WHEN I QUIT SMOKING?  You  may have symptoms of withdrawal because your body is used to nicotine (the addictive substance in cigarettes). You may crave cigarettes, be irritable, feel very hungry, cough often, get headaches, or have difficulty concentrating. The withdrawal symptoms are only temporary. They are strongest when you first quit but will go away within 10-14 days. When withdrawal symptoms occur, stay in control. Think about your reasons for quitting. Remind yourself that these are signs that your body is healing and getting used to being without cigarettes. Remember that withdrawal symptoms are easier to treat than the major diseases that smoking can cause.   Even after the withdrawal is over, expect periodic urges to smoke. However, these cravings are generally short lived and will go away whether you smoke or not. Do not smoke!  WHAT RESOURCES ARE AVAILABLE TO HELP ME QUIT SMOKING?  Your health care provider can direct you to community resources or hospitals for support, which may include:  · Group support.  · Education.  · Hypnosis.  · Therapy.  Document Released: 05/18/2004 Document Revised: 01/04/2014 Document Reviewed: 02/05/2013  [employer]® Patient Information ©2015 [employer]. This information is not intended to replace advice given to you by your health care provider. Make sure you discuss any questions you have with your health care provider.

## 2014-12-12 ENCOUNTER — Other Ambulatory Visit: Payer: Self-pay | Admitting: *Deleted

## 2014-12-24 ENCOUNTER — Other Ambulatory Visit: Payer: Self-pay | Admitting: Physician Assistant

## 2014-12-25 ENCOUNTER — Inpatient Hospital Stay (HOSPITAL_COMMUNITY)
Admission: EM | Admit: 2014-12-25 | Discharge: 2014-12-28 | DRG: 562 | Disposition: A | Discharge status: Skilled Nursing Facility | Payer: Medicare Other | Attending: Internal Medicine | Admitting: Internal Medicine

## 2014-12-25 ENCOUNTER — Encounter (HOSPITAL_COMMUNITY): Payer: Self-pay | Admitting: Emergency Medicine

## 2014-12-25 ENCOUNTER — Emergency Department (HOSPITAL_COMMUNITY): Payer: Medicare Other

## 2014-12-25 DIAGNOSIS — C3490 Malignant neoplasm of unspecified part of unspecified bronchus or lung: Secondary | ICD-10-CM | POA: Diagnosis present

## 2014-12-25 DIAGNOSIS — S72112A Displaced fracture of greater trochanter of left femur, initial encounter for closed fracture: Secondary | ICD-10-CM | POA: Diagnosis not present

## 2014-12-25 DIAGNOSIS — E782 Mixed hyperlipidemia: Secondary | ICD-10-CM | POA: Diagnosis not present

## 2014-12-25 DIAGNOSIS — E119 Type 2 diabetes mellitus without complications: Secondary | ICD-10-CM | POA: Diagnosis present

## 2014-12-25 DIAGNOSIS — F17210 Nicotine dependence, cigarettes, uncomplicated: Secondary | ICD-10-CM | POA: Diagnosis present

## 2014-12-25 DIAGNOSIS — S52502D Unspecified fracture of the lower end of left radius, subsequent encounter for closed fracture with routine healing: Secondary | ICD-10-CM | POA: Diagnosis not present

## 2014-12-25 DIAGNOSIS — H9190 Unspecified hearing loss, unspecified ear: Secondary | ICD-10-CM | POA: Diagnosis not present

## 2014-12-25 DIAGNOSIS — Z85828 Personal history of other malignant neoplasm of skin: Secondary | ICD-10-CM | POA: Diagnosis not present

## 2014-12-25 DIAGNOSIS — S79912A Unspecified injury of left hip, initial encounter: Secondary | ICD-10-CM | POA: Diagnosis not present

## 2014-12-25 DIAGNOSIS — J9601 Acute respiratory failure with hypoxia: Secondary | ICD-10-CM | POA: Diagnosis present

## 2014-12-25 DIAGNOSIS — J441 Chronic obstructive pulmonary disease with (acute) exacerbation: Secondary | ICD-10-CM | POA: Diagnosis present

## 2014-12-25 DIAGNOSIS — M545 Low back pain: Secondary | ICD-10-CM | POA: Diagnosis not present

## 2014-12-25 DIAGNOSIS — I10 Essential (primary) hypertension: Secondary | ICD-10-CM | POA: Diagnosis present

## 2014-12-25 DIAGNOSIS — S52502A Unspecified fracture of the lower end of left radius, initial encounter for closed fracture: Secondary | ICD-10-CM | POA: Diagnosis present

## 2014-12-25 DIAGNOSIS — S72002A Fracture of unspecified part of neck of left femur, initial encounter for closed fracture: Secondary | ICD-10-CM | POA: Diagnosis present

## 2014-12-25 DIAGNOSIS — E875 Hyperkalemia: Secondary | ICD-10-CM | POA: Diagnosis present

## 2014-12-25 DIAGNOSIS — F329 Major depressive disorder, single episode, unspecified: Secondary | ICD-10-CM | POA: Diagnosis not present

## 2014-12-25 DIAGNOSIS — S59292A Other physeal fracture of lower end of radius, left arm, initial encounter for closed fracture: Secondary | ICD-10-CM | POA: Diagnosis not present

## 2014-12-25 DIAGNOSIS — M150 Primary generalized (osteo)arthritis: Secondary | ICD-10-CM

## 2014-12-25 DIAGNOSIS — F17200 Nicotine dependence, unspecified, uncomplicated: Secondary | ICD-10-CM | POA: Diagnosis present

## 2014-12-25 DIAGNOSIS — Y92008 Other place in unspecified non-institutional (private) residence as the place of occurrence of the external cause: Secondary | ICD-10-CM

## 2014-12-25 DIAGNOSIS — Z7982 Long term (current) use of aspirin: Secondary | ICD-10-CM | POA: Diagnosis not present

## 2014-12-25 DIAGNOSIS — M4856XA Collapsed vertebra, not elsewhere classified, lumbar region, initial encounter for fracture: Secondary | ICD-10-CM | POA: Diagnosis present

## 2014-12-25 DIAGNOSIS — W07XXXA Fall from chair, initial encounter: Secondary | ICD-10-CM | POA: Diagnosis present

## 2014-12-25 DIAGNOSIS — F32A Depression, unspecified: Secondary | ICD-10-CM | POA: Diagnosis present

## 2014-12-25 DIAGNOSIS — S52532A Colles' fracture of left radius, initial encounter for closed fracture: Principal | ICD-10-CM | POA: Diagnosis present

## 2014-12-25 DIAGNOSIS — R05 Cough: Secondary | ICD-10-CM | POA: Diagnosis not present

## 2014-12-25 DIAGNOSIS — Z923 Personal history of irradiation: Secondary | ICD-10-CM

## 2014-12-25 DIAGNOSIS — S3992XA Unspecified injury of lower back, initial encounter: Secondary | ICD-10-CM | POA: Diagnosis not present

## 2014-12-25 DIAGNOSIS — J449 Chronic obstructive pulmonary disease, unspecified: Secondary | ICD-10-CM | POA: Diagnosis not present

## 2014-12-25 DIAGNOSIS — Z79899 Other long term (current) drug therapy: Secondary | ICD-10-CM | POA: Diagnosis not present

## 2014-12-25 DIAGNOSIS — E559 Vitamin D deficiency, unspecified: Secondary | ICD-10-CM | POA: Diagnosis not present

## 2014-12-25 DIAGNOSIS — R059 Cough, unspecified: Secondary | ICD-10-CM

## 2014-12-25 DIAGNOSIS — M159 Polyosteoarthritis, unspecified: Secondary | ICD-10-CM

## 2014-12-25 DIAGNOSIS — Z85118 Personal history of other malignant neoplasm of bronchus and lung: Secondary | ICD-10-CM

## 2014-12-25 DIAGNOSIS — S52532D Colles' fracture of left radius, subsequent encounter for closed fracture with routine healing: Secondary | ICD-10-CM | POA: Diagnosis not present

## 2014-12-25 DIAGNOSIS — M25552 Pain in left hip: Secondary | ICD-10-CM | POA: Diagnosis not present

## 2014-12-25 DIAGNOSIS — W19XXXA Unspecified fall, initial encounter: Secondary | ICD-10-CM

## 2014-12-25 DIAGNOSIS — T148 Other injury of unspecified body region: Secondary | ICD-10-CM | POA: Diagnosis not present

## 2014-12-25 DIAGNOSIS — M25532 Pain in left wrist: Secondary | ICD-10-CM | POA: Diagnosis not present

## 2014-12-25 LAB — I-STAT CHEM 8, ED
BUN: 18 mg/dL (ref 6–23)
CALCIUM ION: 1.22 mmol/L (ref 1.13–1.30)
Chloride: 100 mmol/L (ref 96–112)
Creatinine, Ser: 0.8 mg/dL (ref 0.50–1.10)
GLUCOSE: 107 mg/dL — AB (ref 70–99)
HEMATOCRIT: 43 % (ref 36.0–46.0)
HEMOGLOBIN: 14.6 g/dL (ref 12.0–15.0)
POTASSIUM: 4.6 mmol/L (ref 3.5–5.1)
Sodium: 138 mmol/L (ref 135–145)
TCO2: 27 mmol/L (ref 0–100)

## 2014-12-25 MED ORDER — MORPHINE SULFATE 4 MG/ML IJ SOLN
4.0000 mg | Freq: Once | INTRAMUSCULAR | Status: AC
  Administered 2014-12-25: 4 mg via INTRAVENOUS
  Filled 2014-12-25: qty 1

## 2014-12-25 MED ORDER — ONDANSETRON HCL 4 MG/2ML IJ SOLN
4.0000 mg | INTRAMUSCULAR | Status: AC
  Administered 2014-12-25: 4 mg via INTRAVENOUS
  Filled 2014-12-25: qty 2

## 2014-12-25 NOTE — ED Provider Notes (Signed)
CSN: [id number]     Arrival date & time 12/25/14  2116 History   First MD Initiated Contact with Patient 12/25/14 2118     Chief Complaint  Patient presents with  . Fall  . Arm Pain    left   . Hip Pain    left   (Consider location/radiation/quality/duration/timing/severity/associated sxs/prior Treatment) HPI  Hailey Keith is a 80 yo female with PMH of COPD, HTN, and diabetes, presenting with report of a fall.  She states she was sitting on her back porch for some fresh air and was sitting in a tall chair.  She got up to fix herself a drink and she fell out of the chair onto her left side.  She instantly felt pain in her left hip and her left wrist.  She did not feel like she could stand so she laid in the floor until EMS arrived.   She originally rated her pain as 8/10 but received meds from EMS.  She currently rates her pain as 3/10, describes it as aching in her left wrist. Her last meal was at lunch time.  She denies drinking any alcohol yet tonight.  She denies feeling light-headed or dizzines before or after the fall. She denies any chest pain, shortness of breath, or LOC.    Past Medical History  Diagnosis Date  . COPD (chronic obstructive pulmonary disease)   . Hypertension   . Dyslipidemia   . Hard of hearing   . Hx of radiation therapy 06/18/13- 07/23/13    LUL lung primary, 7020 cGy 26 sessions  . 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 Skin cancer     basal cell  . Type II or unspecified type diabetes mellitus without mention of complication, not stated as uncontrolled    Past Surgical History  Procedure Laterality Date  . Tonsillectomy      as child, repeat surgery early 20's  . Mohs surgery      "several times"  . Facial reconstruction surgery  25 years ago    to remove skin cancer, right side of face   Family History  Problem Relation Age of Onset  . Hypertension    . Lung cancer Sister     tx  w/radiation, met to stomach  . Pneumonia Mother     Deceased  . Heart attack Father     Deceased  . Cancer Sister     "back of neck"   History  Substance Use Topics  . Smoking status: Current Every Day Smoker -- 0.50 packs/day for 60 years    Types: Cigarettes  . Smokeless tobacco: Not on file     Comment: 06/05/13 currently 1/2 PPD, trying to quit  . Alcohol Use: Yes     Comment: occ wine, 2 drinks nightly   OB History    No data available     Review of Systems  Constitutional: Negative for fever and chills.  HENT: Negative for sore throat.   Eyes: Negative for visual disturbance.  Respiratory: Negative for cough and shortness of breath.   Cardiovascular: Negative for chest pain and leg swelling.  Gastrointestinal: Negative for nausea, vomiting and diarrhea.  Genitourinary: Negative for dysuria.  Musculoskeletal: Positive for myalgias, arthralgias and gait problem. Negative for neck pain.  Skin: Negative for rash.  Neurological: Negative for weakness, numbness and headaches.    Allergies  Paxil  Home Medications  Prior to Admission medications   Medication Sig Start Date End Date Taking? Authorizing Provider  albuterol (PROVENTIL HFA;VENTOLIN HFA) 108 (90 BASE) MCG/ACT inhaler 1 to 2 inhalations every 4 hours as needed for Rescue Asthma 03/31/14  Yes Unk Pinto, MD  aspirin 81 MG tablet Take 81 mg by mouth daily.   Yes Historical Provider, MD  citalopram (CELEXA) 40 MG tablet TAKE 1 TABLET BY MOUTH DAILY FOR MOOD 12/24/14  Yes Vicie Mutters, PA-C  doxazosin (CARDURA) 8 MG tablet TAKE 1 TABLET AT BEDTIME 09/30/14  Yes Unk Pinto, MD  ipratropium-albuterol (DUONEB) 0.5-2.5 (3) MG/3ML SOLN Take 3 mLs by nebulization 4 (four) times daily. 03/31/14  Yes Unk Pinto, MD  losartan (COZAAR) 100 MG tablet TAKE 1 TABLET BY MOUTH DAILY AS NEEDED FOR BLOOD PRESSURE 05/02/14  Yes Unk Pinto, MD  predniSONE (DELTASONE) 5 MG tablet Take 1 tablet 3 x daily or as  directed 11/02/14 05/05/15 Yes Unk Pinto, MD  temazepam (RESTORIL) 15 MG capsule TAKE 1 OR 2 CASPULES BY MOUTH FOR SLEEP IF NEEDED Patient not taking: Reported on 11/30/2014    Deneise Lever, MD   BP 151/58 mmHg  Pulse 78  Temp(Src) 98.2 F (36.8 C) (Oral)  Resp 20  Ht $R'5\' 4"'Sg$  (1.626 m)  Wt 140 lb (63.504 kg)  BMI 24.02 kg/m2  SpO2 96% Physical Exam  Constitutional: She is oriented to person, place, and time. She appears well-developed and well-nourished. No distress.  HENT:  Head: Normocephalic and atraumatic.  Mouth/Throat: Oropharynx is clear and moist.  Eyes: Conjunctivae are normal.  Neck: Neck supple.  Cardiovascular: Normal rate, regular rhythm and intact distal pulses.   Pulmonary/Chest: Effort normal. No respiratory distress. She has decreased breath sounds in the right middle field, the right lower field, the left middle field and the left lower field. She has no wheezes. She has no rhonchi. She has no rales. She exhibits no tenderness.  Abdominal: Soft. There is no tenderness.  Musculoskeletal: She exhibits tenderness.       Left wrist: She exhibits decreased range of motion, tenderness, bony tenderness, crepitus and deformity.       Left hip: She exhibits decreased range of motion, decreased strength, tenderness and bony tenderness. She exhibits no deformity.       Lumbar back: She exhibits tenderness and bony tenderness.       Back:  Mild deformity noted to left wrist, N/V intact, pt has decreased external rotation range of motion due to pain in left hip  Lymphadenopathy:    She has no cervical adenopathy.  Neurological: She is alert and oriented to person, place, and time. No cranial nerve deficit. Coordination normal.  Skin: Skin is warm and dry. No rash noted. She is not diaphoretic.  Psychiatric: She has a normal mood and affect.  Nursing note and vitals reviewed.   ED Course  Procedures (including critical care time) Labs Review Labs Reviewed  I-STAT CHEM  8, ED - Abnormal; Notable for the following:    Glucose, Bld 107 (*)    All other components within normal limits    Imaging Review Dg Wrist Complete Left  12/25/2014   CLINICAL DATA:  Patient fell on to the wrist.  Pain.  EXAM: LEFT WRIST - COMPLETE 3+ VIEW  COMPARISON:  None.  FINDINGS: There is a comminuted and impacted distal radius fracture with dorsal angulation. Slight avulsion ulnar styloid process. Osseous demineralization. Soft tissue swelling.  IMPRESSION: Collies fracture distal radius.  Ulnar styloid avulsion.  Electronically Signed   By: Rolla Flatten M.D.   On: 12/25/2014 22:52   Ct Hip Left Wo Contrast  12/26/2014   CLINICAL DATA:  [place].  Pain.  EXAM: CT OF THE LEFT HIP WITHOUT CONTRAST  TECHNIQUE: Multidetector CT imaging of the left hip was performed according to the standard protocol. Multiplanar CT image reconstructions were also generated.  COMPARISON:  Plain films earlier today.  FINDINGS: There is an avulsion fracture of the superior aspect of the greater trochanter with slight displacement as suspected from plain films. No transverse fracture across the femoral neck or intertrochanteric region. The acetabulum and visualized pelvic bones are intact. Slight surrounding hematoma.  IMPRESSION: Avulsion fracture of the superior aspect of the greater trochanter with slight displacement. No transverse femoral neck or intertrochanteric fracture.   Electronically Signed   By: Rolla Flatten M.D.   On: 12/26/2014 00:36   Dg Hip Unilat With Pelvis 2-3 Views Left  12/25/2014   CLINICAL DATA:  [place], pain.  EXAM: LEFT HIP (WITH PELVIS) 2-3 VIEWS  COMPARISON:  None.  FINDINGS: There is no evidence of subcapital or intertrochanteric hip fracture or dislocation. On the AP hip view, there is slight irregularity at the superior margin of greater trochanter, and a nondisplaced greater trochanteric fracture is difficult to exclude ([place]). Acetabulum and pelvic bones appear intact. Moderate stool  burden. Osseous demineralization.  IMPRESSION: Difficult to exclude a nondisplaced fracture of the greater trochanter. No transverse subcapital or intertrochanteric hip fracture is evident.   Electronically Signed   By: Rolla Flatten M.D.   On: 12/25/2014 22:55     EKG Interpretation None      MDM   Final diagnoses:  Fall  Greater trochanter fracture, left, closed, initial encounter  Colles' fracture, left, closed, initial encounter  Cough   80 yo with mechanical fall from chair with left wrist and left hip pain. On initial exam, pain meds deferred.  Discussed the case with Dr. Roderic Palau.    10:55PM: Wrist x-ray shows distal radius collies fracture.  Hip x-ray cannot exclude a non-displaced fracture in the greater trochanter.    11:05 PM:  On re-exam, pt remains TTP to left greater trochanter and pain with external rotation, she also has is tender to palpation to her lumbar spine. Pain meds and CT hip and L-spine ordered. Discussed findings and current plan with family and pt.   12:30 AM: Ct hip shows avulsion fracture of the superior aspect of the greater trochanter with slight displacement.  Awaiting Lumbar spine CT results to determine ortho vs trauma to call for consult.  01:05 AM: At end of shift, hand of report given th Charlann Lange, PA-C. Plan includes reviewing L-spine CT when resulted and if no lumbar fracture, consult ortho for hip and wrist fracture. If spine fracture, consult trauma service.  Pt currently resting with adequate pain management.    Filed Vitals:   12/25/14 2126 12/25/14 2130 12/25/14 2145 12/25/14 2300  BP: 151/58 143/57 135/88 151/61  Pulse: 78 77 78 76  Temp: 98.2 F (36.8 C)     TempSrc: Oral     Resp: $Remo'20 21 26 20  'hfeZo$ Height: $Rem'5\' 4"'uCgE$  (1.626 m)     Weight: 140 lb (63.504 kg)     SpO2: 96% 97% 97% 98%   Meds given in ED:  Medications  morphine 4 MG/ML injection 4 mg (4 mg Intravenous Given 12/25/14 2320)  ondansetron (ZOFRAN) injection 4 mg (4 mg  Intravenous Given 12/25/14 2320)  morphine  4 MG/ML injection 4 mg (4 mg Intravenous Given 12/26/14 0044)    New Prescriptions   No medications on file       Britt Bottom, NP 12/26/14 1118  Milton Ferguson, MD 12/26/14 2326

## 2014-12-25 NOTE — ED Notes (Signed)
Fell from a stool on back porch.  C/o pain in left wrist and left hip.  Denies any LOC.  Given fentanyl 229mg by EMS.

## 2014-12-26 ENCOUNTER — Other Ambulatory Visit: Payer: Self-pay

## 2014-12-26 DIAGNOSIS — E782 Mixed hyperlipidemia: Secondary | ICD-10-CM

## 2014-12-26 DIAGNOSIS — Z85828 Personal history of other malignant neoplasm of skin: Secondary | ICD-10-CM | POA: Diagnosis not present

## 2014-12-26 DIAGNOSIS — E559 Vitamin D deficiency, unspecified: Secondary | ICD-10-CM | POA: Diagnosis present

## 2014-12-26 DIAGNOSIS — M25532 Pain in left wrist: Secondary | ICD-10-CM | POA: Diagnosis present

## 2014-12-26 DIAGNOSIS — M4856XA Collapsed vertebra, not elsewhere classified, lumbar region, initial encounter for fracture: Secondary | ICD-10-CM | POA: Diagnosis present

## 2014-12-26 DIAGNOSIS — S52532D Colles' fracture of left radius, subsequent encounter for closed fracture with routine healing: Secondary | ICD-10-CM

## 2014-12-26 DIAGNOSIS — F17210 Nicotine dependence, cigarettes, uncomplicated: Secondary | ICD-10-CM | POA: Diagnosis present

## 2014-12-26 DIAGNOSIS — Z7982 Long term (current) use of aspirin: Secondary | ICD-10-CM | POA: Diagnosis not present

## 2014-12-26 DIAGNOSIS — C3490 Malignant neoplasm of unspecified part of unspecified bronchus or lung: Secondary | ICD-10-CM | POA: Diagnosis not present

## 2014-12-26 DIAGNOSIS — R05 Cough: Secondary | ICD-10-CM | POA: Diagnosis not present

## 2014-12-26 DIAGNOSIS — E119 Type 2 diabetes mellitus without complications: Secondary | ICD-10-CM | POA: Diagnosis present

## 2014-12-26 DIAGNOSIS — Z9181 History of falling: Secondary | ICD-10-CM | POA: Diagnosis not present

## 2014-12-26 DIAGNOSIS — Z720 Tobacco use: Secondary | ICD-10-CM

## 2014-12-26 DIAGNOSIS — E876 Hypokalemia: Secondary | ICD-10-CM | POA: Diagnosis not present

## 2014-12-26 DIAGNOSIS — J449 Chronic obstructive pulmonary disease, unspecified: Secondary | ICD-10-CM | POA: Diagnosis not present

## 2014-12-26 DIAGNOSIS — I10 Essential (primary) hypertension: Secondary | ICD-10-CM

## 2014-12-26 DIAGNOSIS — S72002A Fracture of unspecified part of neck of left femur, initial encounter for closed fracture: Secondary | ICD-10-CM | POA: Diagnosis present

## 2014-12-26 DIAGNOSIS — F329 Major depressive disorder, single episode, unspecified: Secondary | ICD-10-CM

## 2014-12-26 DIAGNOSIS — S59292A Other physeal fracture of lower end of radius, left arm, initial encounter for closed fracture: Secondary | ICD-10-CM | POA: Diagnosis not present

## 2014-12-26 DIAGNOSIS — S52532A Colles' fracture of left radius, initial encounter for closed fracture: Principal | ICD-10-CM

## 2014-12-26 DIAGNOSIS — M6281 Muscle weakness (generalized): Secondary | ICD-10-CM | POA: Diagnosis not present

## 2014-12-26 DIAGNOSIS — W07XXXA Fall from chair, initial encounter: Secondary | ICD-10-CM | POA: Diagnosis present

## 2014-12-26 DIAGNOSIS — W19XXXA Unspecified fall, initial encounter: Secondary | ICD-10-CM

## 2014-12-26 DIAGNOSIS — Z923 Personal history of irradiation: Secondary | ICD-10-CM | POA: Diagnosis not present

## 2014-12-26 DIAGNOSIS — M21252 Flexion deformity, left hip: Secondary | ICD-10-CM | POA: Diagnosis not present

## 2014-12-26 DIAGNOSIS — E875 Hyperkalemia: Secondary | ICD-10-CM | POA: Diagnosis present

## 2014-12-26 DIAGNOSIS — Z79899 Other long term (current) drug therapy: Secondary | ICD-10-CM | POA: Diagnosis not present

## 2014-12-26 DIAGNOSIS — H9190 Unspecified hearing loss, unspecified ear: Secondary | ICD-10-CM | POA: Diagnosis present

## 2014-12-26 DIAGNOSIS — S52502D Unspecified fracture of the lower end of left radius, subsequent encounter for closed fracture with routine healing: Secondary | ICD-10-CM | POA: Diagnosis not present

## 2014-12-26 DIAGNOSIS — S52502A Unspecified fracture of the lower end of left radius, initial encounter for closed fracture: Secondary | ICD-10-CM | POA: Diagnosis present

## 2014-12-26 DIAGNOSIS — Y92008 Other place in unspecified non-institutional (private) residence as the place of occurrence of the external cause: Secondary | ICD-10-CM | POA: Diagnosis not present

## 2014-12-26 DIAGNOSIS — S32028D Other fracture of second lumbar vertebra, subsequent encounter for fracture with routine healing: Secondary | ICD-10-CM | POA: Diagnosis not present

## 2014-12-26 DIAGNOSIS — S72112A Displaced fracture of greater trochanter of left femur, initial encounter for closed fracture: Secondary | ICD-10-CM | POA: Diagnosis not present

## 2014-12-26 DIAGNOSIS — J9601 Acute respiratory failure with hypoxia: Secondary | ICD-10-CM | POA: Diagnosis not present

## 2014-12-26 DIAGNOSIS — Z85118 Personal history of other malignant neoplasm of bronchus and lung: Secondary | ICD-10-CM | POA: Diagnosis not present

## 2014-12-26 DIAGNOSIS — J441 Chronic obstructive pulmonary disease with (acute) exacerbation: Secondary | ICD-10-CM | POA: Diagnosis not present

## 2014-12-26 DIAGNOSIS — R278 Other lack of coordination: Secondary | ICD-10-CM | POA: Diagnosis not present

## 2014-12-26 DIAGNOSIS — S72112D Displaced fracture of greater trochanter of left femur, subsequent encounter for closed fracture with routine healing: Secondary | ICD-10-CM | POA: Diagnosis not present

## 2014-12-26 LAB — PROTIME-INR
INR: 1.01 (ref 0.00–1.49)
Prothrombin Time: 13.4 seconds (ref 11.6–15.2)

## 2014-12-26 LAB — COMPREHENSIVE METABOLIC PANEL
ALBUMIN: 3.1 g/dL — AB (ref 3.5–5.2)
ALT: 19 U/L (ref 0–35)
AST: 43 U/L — AB (ref 0–37)
Alkaline Phosphatase: 60 U/L (ref 39–117)
Anion gap: 8 (ref 5–15)
BILIRUBIN TOTAL: 1.4 mg/dL — AB (ref 0.3–1.2)
BUN: 17 mg/dL (ref 6–23)
CALCIUM: 8.5 mg/dL (ref 8.4–10.5)
CO2: 25 mmol/L (ref 19–32)
Chloride: 103 mmol/L (ref 96–112)
Creatinine, Ser: 0.84 mg/dL (ref 0.50–1.10)
GFR calc Af Amer: 76 mL/min — ABNORMAL LOW (ref >90)
GFR calc non Af Amer: 65 mL/min — ABNORMAL LOW (ref >90)
GLUCOSE: 102 mg/dL — AB (ref 70–99)
Potassium: 6.4 mmol/L (ref 3.5–5.1)
Sodium: 136 mmol/L (ref 135–145)
Total Protein: 5 g/dL — ABNORMAL LOW (ref 6.0–8.3)

## 2014-12-26 LAB — TYPE AND SCREEN
ABO/RH(D): O NEG
Antibody Screen: NEGATIVE

## 2014-12-26 LAB — CBC
HCT: 37.9 % (ref 36.0–46.0)
Hemoglobin: 12.2 g/dL (ref 12.0–15.0)
MCH: 30.6 pg (ref 26.0–34.0)
MCHC: 32.2 g/dL (ref 30.0–36.0)
MCV: 95 fL (ref 78.0–100.0)
Platelets: 197 10*3/uL (ref 150–400)
RBC: 3.99 MIL/uL (ref 3.87–5.11)
RDW: 14.6 % (ref 11.5–15.5)
WBC: 13.3 10*3/uL — AB (ref 4.0–10.5)

## 2014-12-26 LAB — GLUCOSE, CAPILLARY
GLUCOSE-CAPILLARY: 64 mg/dL — AB (ref 70–99)
GLUCOSE-CAPILLARY: 165 mg/dL — AB (ref 70–99)
Glucose-Capillary: 107 mg/dL — ABNORMAL HIGH (ref 70–99)

## 2014-12-26 LAB — POTASSIUM: Potassium: 3.9 mmol/L (ref 3.5–5.1)

## 2014-12-26 LAB — APTT: APTT: 26 s (ref 24–37)

## 2014-12-26 MED ORDER — ACETAMINOPHEN 325 MG PO TABS: 650.0000 mg | ORAL_TABLET | Freq: Four times a day (QID) | ORAL | Status: DC | PRN

## 2014-12-26 MED ORDER — ONDANSETRON HCL 4 MG/2ML IJ SOLN: 4.0000 mg | Freq: Three times a day (TID) | INTRAMUSCULAR | Status: DC | PRN

## 2014-12-26 MED ORDER — IPRATROPIUM-ALBUTEROL 0.5-2.5 (3) MG/3ML IN SOLN: 3.0000 mL | Freq: Four times a day (QID) | RESPIRATORY_TRACT | Status: DC

## 2014-12-26 MED ORDER — HEPARIN SODIUM (PORCINE) 5000 UNIT/ML IJ SOLN
5000.0000 [IU] | Freq: Three times a day (TID) | INTRAMUSCULAR | Status: DC
  Administered 2014-12-26 (×2): 5000 [IU] via SUBCUTANEOUS
  Filled 2014-12-26 (×2): qty 1

## 2014-12-26 MED ORDER — ENOXAPARIN SODIUM 40 MG/0.4ML ~~LOC~~ SOLN
40.0000 mg | SUBCUTANEOUS | Status: DC
  Administered 2014-12-26 – 2014-12-28 (×3): 40 mg via SUBCUTANEOUS
  Filled 2014-12-26 (×3): qty 0.4

## 2014-12-26 MED ORDER — ATORVASTATIN CALCIUM 80 MG PO TABS: 80.0000 mg | ORAL_TABLET | ORAL | Status: DC

## 2014-12-26 MED ORDER — SODIUM POLYSTYRENE SULFONATE 15 GM/60ML PO SUSP
30.0000 g | Freq: Once | ORAL | Status: AC
  Administered 2014-12-26: 30 g via ORAL
  Filled 2014-12-26: qty 120

## 2014-12-26 MED ORDER — OXYCODONE-ACETAMINOPHEN 5-325 MG PO TABS
1.0000 | ORAL_TABLET | ORAL | Status: DC | PRN
  Administered 2014-12-26 – 2014-12-28 (×6): 1 via ORAL
  Filled 2014-12-26 (×6): qty 1

## 2014-12-26 MED ORDER — MORPHINE SULFATE 4 MG/ML IJ SOLN
2.0000 mg | INTRAMUSCULAR | Status: DC | PRN
  Administered 2014-12-26 – 2014-12-27 (×2): 2 mg via INTRAVENOUS
  Filled 2014-12-26 (×2): qty 1

## 2014-12-26 MED ORDER — DEXTROSE 50 % IV SOLN
25.0000 mL | Freq: Once | INTRAVENOUS | Status: AC
  Administered 2014-12-26: 25 mL via INTRAVENOUS
  Filled 2014-12-26: qty 50

## 2014-12-26 MED ORDER — ALBUTEROL SULFATE (2.5 MG/3ML) 0.083% IN NEBU
2.5000 mg | INHALATION_SOLUTION | RESPIRATORY_TRACT | Status: DC | PRN
  Administered 2014-12-27: 2.5 mg via RESPIRATORY_TRACT
  Filled 2014-12-26: qty 3

## 2014-12-26 MED ORDER — PREDNISONE 5 MG PO TABS: 5.0000 mg | ORAL_TABLET | Freq: Two times a day (BID) | ORAL | Status: DC

## 2014-12-26 MED ORDER — DOXAZOSIN MESYLATE 8 MG PO TABS
8.0000 mg | ORAL_TABLET | Freq: Every day | ORAL | Status: DC
Start: 2014-12-26 — End: 2014-12-28
  Administered 2014-12-26 – 2014-12-27 (×2): 8 mg via ORAL
  Filled 2014-12-26 (×4): qty 1

## 2014-12-26 MED ORDER — SODIUM CHLORIDE 0.9 % IV SOLN
1.0000 g | Freq: Once | INTRAVENOUS | Status: AC
  Administered 2014-12-26: 1 g via INTRAVENOUS
  Filled 2014-12-26: qty 10

## 2014-12-26 MED ORDER — SODIUM CHLORIDE 0.9 % IV SOLN
INTRAVENOUS | Status: DC
  Administered 2014-12-26: 04:00:00 via INTRAVENOUS

## 2014-12-26 MED ORDER — ALBUTEROL SULFATE (2.5 MG/3ML) 0.083% IN NEBU: 2.5000 mg | INHALATION_SOLUTION | RESPIRATORY_TRACT | Status: DC | PRN

## 2014-12-26 MED ORDER — IPRATROPIUM-ALBUTEROL 0.5-2.5 (3) MG/3ML IN SOLN
3.0000 mL | Freq: Four times a day (QID) | RESPIRATORY_TRACT | Status: DC
  Administered 2014-12-26 – 2014-12-28 (×9): 3 mL via RESPIRATORY_TRACT
  Filled 2014-12-26 (×9): qty 3

## 2014-12-26 MED ORDER — CITALOPRAM HYDROBROMIDE 40 MG PO TABS
40.0000 mg | ORAL_TABLET | Freq: Every day | ORAL | Status: DC
  Administered 2014-12-26 – 2014-12-28 (×3): 40 mg via ORAL
  Filled 2014-12-26 (×3): qty 1

## 2014-12-26 MED ORDER — MORPHINE SULFATE 4 MG/ML IJ SOLN
4.0000 mg | Freq: Once | INTRAMUSCULAR | Status: AC
  Administered 2014-12-26: 4 mg via INTRAVENOUS
  Filled 2014-12-26: qty 1

## 2014-12-26 MED ORDER — NICOTINE 21 MG/24HR TD PT24
21.0000 mg | MEDICATED_PATCH | Freq: Every day | TRANSDERMAL | Status: DC
  Administered 2014-12-26 – 2014-12-28 (×3): 21 mg via TRANSDERMAL
  Filled 2014-12-26 (×3): qty 1

## 2014-12-26 MED ORDER — PREDNISONE 20 MG PO TABS
50.0000 mg | ORAL_TABLET | Freq: Every day | ORAL | Status: DC
  Administered 2014-12-27 – 2014-12-28 (×2): 50 mg via ORAL
  Filled 2014-12-26 (×2): qty 1
  Filled 2014-12-26 (×2): qty 2

## 2014-12-26 MED ORDER — LOSARTAN POTASSIUM 50 MG PO TABS
100.0000 mg | ORAL_TABLET | Freq: Every day | ORAL | Status: DC
  Administered 2014-12-27 – 2014-12-28 (×2): 100 mg via ORAL
  Filled 2014-12-26 (×3): qty 2

## 2014-12-26 MED ORDER — ALUM & MAG HYDROXIDE-SIMETH 200-200-20 MG/5ML PO SUSP: 30.0000 mL | Freq: Four times a day (QID) | ORAL | Status: DC | PRN

## 2014-12-26 MED ORDER — INSULIN ASPART 100 UNIT/ML ~~LOC~~ SOLN
10.0000 [IU] | Freq: Once | SUBCUTANEOUS | Status: AC
  Administered 2014-12-26: 10 [IU] via SUBCUTANEOUS

## 2014-12-26 MED ORDER — ACETAMINOPHEN 650 MG RE SUPP
650.0000 mg | Freq: Four times a day (QID) | RECTAL | Status: DC | PRN
Start: 2014-12-26 — End: 2014-12-28

## 2014-12-26 MED ORDER — DM-GUAIFENESIN ER 30-600 MG PO TB12
1.0000 | ORAL_TABLET | Freq: Two times a day (BID) | ORAL | Status: DC
  Administered 2014-12-26 – 2014-12-28 (×5): 1 via ORAL
  Filled 2014-12-26 (×6): qty 1

## 2014-12-26 MED ORDER — ASPIRIN EC 81 MG PO TBEC
81.0000 mg | DELAYED_RELEASE_TABLET | Freq: Every day | ORAL | Status: DC
  Administered 2014-12-26 – 2014-12-28 (×3): 81 mg via ORAL
  Filled 2014-12-26 (×3): qty 1

## 2014-12-26 NOTE — Progress Notes (Signed)
[facility] PROGRESS NOTE  [name] MWN:[id number] DOB: Jul 09, 1935 DOA: 12/25/2014 PCP: Alesia Richards, MD   Brief narrative 80 year old female with history of hypertension, hyperlipidemia, depression, COPD with history of lung cancer (left upper lobe squamous cell, status post radiation therapy), ongoing tobacco use presented with fall at home with left colles wrist fracture and  left greater trochanter fracture with slight displacement. Patient also found to have age indeterminate L2 compression fracture.     Assessment/Plan: Avulsion fracture of the left greater trochanter Seen by orthopedics and recommend nonoperative treatment. Test on weightbearing with platform walker. Seen by physical therapy and recommended skilled nursing facility. Follow-up with Dr. Percell Miller in 1 week. Pain control with when necessary Percocet.  Left colles fracture Closed reduction done by hand surgeon upon admission as patient thought to be a poor surgical candidate given her significant comorbidities. Continue pain control. Pain control and PT. Follow with Dr Amedeo Plenty as outpt.  COPD with exacerbation Patient wheezing on exam. On scheduled on 11 when necessary albuterol. On oral prednisone at home and switched to 50 mg daily. Supportive care with antitussives. We'll add Levaquin. Counseled strongly on smoking cessation. Ordered nicotine patch.  Acute Hypokalemia Mild peaked T wave on lateral leads. Given a dose of IV ca gluconate, insulin with dextrose and kayexalate. Repeat potassium normalized.  L2 compression fracture Acute vs chronic. Asymptmoiatic. continue PT.   Essential hypertension Continue Cozaar  Hyperlipidemia Continue Lipitor   Diet: Heart healthy  DVT prophylaxis: Subcutaneous Lovenox   Code Status: Full code Family Communication: None at bedside Disposition Plan: Skilled nursing facility lysine 24-48 hours   Consultants:  Dr. Percell Miller  (orthopedics)  Dr. Amedeo Plenty ( hand surgery)  Procedures:  Closed reduction of left   Antibiotics:  NONE  HPI/Subjective: Seen and examined. Admission H&P reviewed.  Objective: Filed Vitals:   12/26/14 1345  BP: 100/46  Pulse: 88  Temp: 98.3 F (36.8 C)  Resp: 18    Intake/Output Summary (Last 24 hours) at 12/26/14 1438 Last data filed at 12/26/14 0837  Gross per 24 hour  Intake    240 ml  Output      0 ml  Net    240 ml   Filed Weights   12/25/14 2126 12/26/14 0350  Weight: 63.504 kg (140 lb) 63.5 kg (139 lb 15.9 oz)    Exam:   General:  Elderly female in no acute distress,  HEENT: No pallor, moist oral mucosa, supple neck  Chest: Scattered wheezing bilaterally, no rhonchi or crackles  CVS: Normal S1 and S2, no murmurs or gallop  GI: Soft, nondistended, nontender, bowel sounds present  Musculoskeletal: Dressing over left ROM with sling, limited range of motion of left hip  CNS: Alert and oriented  Data Reviewed: Basic Metabolic Panel:  Recent Labs Lab 12/25/14 2325 12/26/14 0517 12/26/14 1238  NA 138 136  --   K 4.6 6.4* 3.9  CL 100 103  --   CO2  --  25  --   GLUCOSE 107* 102*  --   BUN 18 17  --   CREATININE 0.80 0.84  --   CALCIUM  --  8.5  --    Liver Function Tests:  Recent Labs Lab 12/26/14 0517  AST 43*  ALT 19  ALKPHOS 60  BILITOT 1.4*  PROT 5.0*  ALBUMIN 3.1*   No results for input(s): LIPASE, AMYLASE in the last 168 hours. No results for input(s): AMMONIA in the last 168 hours. CBC:  Recent Labs  Lab Jan 03, 2015 2325 12/26/14 0517  WBC  --  13.3*  HGB 14.6 12.2  HCT 43.0 37.9  MCV  --  95.0  PLT  --  197   Cardiac Enzymes: No results for input(s): CKTOTAL, CKMB, CKMBINDEX, TROPONINI in the last 168 hours. BNP (last 3 results) No results for input(s): BNP in the last 8760 hours.  ProBNP (last 3 results) No results for input(s): PROBNP in the last 8760 hours.  CBG:  Recent Labs Lab 12/26/14 0931  12/26/14 1021  GLUCAP 64* 107*    No results found for this or any previous visit (from the past 240 hour(s)).   Studies: Dg Wrist Complete Left  2015/01/03   CLINICAL DATA:  Patient fell on to the wrist.  Pain.  EXAM: LEFT WRIST - COMPLETE 3+ VIEW  COMPARISON:  None.  FINDINGS: There is a comminuted and impacted distal radius fracture with dorsal angulation. Slight avulsion ulnar styloid process. Osseous demineralization. Soft tissue swelling.  IMPRESSION: Collies fracture distal radius.  Ulnar styloid avulsion.   Electronically Signed   By: Rolla Flatten M.D.   On: 01-03-15 22:52   Ct Lumbar Spine Wo Contrast  12/26/2014   CLINICAL DATA:  [place], pain.  EXAM: CT LUMBAR SPINE WITHOUT CONTRAST  TECHNIQUE: Multidetector CT imaging of the lumbar spine was performed without intravenous contrast administration. Multiplanar CT image reconstructions were also generated.  COMPARISON:  None.  FINDINGS: Dizzy moderate compression deformity at L2, inferior endplate elevation and anterior wedging. Slight retropulsion of bone measuring 5 mm. Loss of 50% vertebral body height anteriorly. This could represent an acute compression fracture. Severe osseous demineralization. Vascular calcification. Mild stenosis at the L2-L3 level related primarily to retropulsion but also mild facet arthropathy. Mild stenosis also present L4-5 related annular bulging and posterior element hypertrophy. Moderately large stool burden.  IMPRESSION: L2 compression fracture of indeterminate age. Correlate clinically for acute compression fracture related to recent fall.  Moderate stenosis at L2-3 related to 5 mm retropulsion from L2.  Mild stenosis also present L4-5.   Electronically Signed   By: Rolla Flatten M.D.   On: 12/26/2014 01:26   Ct Hip Left Wo Contrast  12/26/2014   CLINICAL DATA:  [place].  Pain.  EXAM: CT OF THE LEFT HIP WITHOUT CONTRAST  TECHNIQUE: Multidetector CT imaging of the left hip was performed according to the standard  protocol. Multiplanar CT image reconstructions were also generated.  COMPARISON:  Plain films earlier today.  FINDINGS: There is an avulsion fracture of the superior aspect of the greater trochanter with slight displacement as suspected from plain films. No transverse fracture across the femoral neck or intertrochanteric region. The acetabulum and visualized pelvic bones are intact. Slight surrounding hematoma.  IMPRESSION: Avulsion fracture of the superior aspect of the greater trochanter with slight displacement. No transverse femoral neck or intertrochanteric fracture.   Electronically Signed   By: Rolla Flatten M.D.   On: 12/26/2014 00:36   Dg Hip Unilat With Pelvis 2-3 Views Left  01/03/2015   CLINICAL DATA:  [place], pain.  EXAM: LEFT HIP (WITH PELVIS) 2-3 VIEWS  COMPARISON:  None.  FINDINGS: There is no evidence of subcapital or intertrochanteric hip fracture or dislocation. On the AP hip view, there is slight irregularity at the superior margin of greater trochanter, and a nondisplaced greater trochanteric fracture is difficult to exclude (Arrow). Acetabulum and pelvic bones appear intact. Moderate stool burden. Osseous demineralization.  IMPRESSION: Difficult to exclude a nondisplaced fracture of the greater trochanter. No  transverse subcapital or intertrochanteric hip fracture is evident.   Electronically Signed   By: Rolla Flatten M.D.   On: 12/25/2014 22:55    Scheduled Meds: . aspirin EC  81 mg Oral Daily  . [START ON 12/29/2014] atorvastatin  80 mg Oral Once per day on Wed Sat  . citalopram  40 mg Oral Daily  . dextromethorphan-guaiFENesin  1 tablet Oral BID  . doxazosin  8 mg Oral QHS  . heparin  5,000 Units Subcutaneous 3 times per day  . ipratropium-albuterol  3 mL Nebulization QID  . losartan  100 mg Oral Daily  . nicotine  21 mg Transdermal Daily  . predniSONE  50 mg Oral Q breakfast   Continuous Infusions: . sodium chloride 100 mL/hr at 12/26/14 0400      Time spent: [place], [place]  [facility] Pager [phone]. If 7PM-7AM, please contact night-coverage at [email or web address], password [facility] 12/26/2014, 2:38 PM  LOS: 0 days

## 2014-12-26 NOTE — ED Notes (Addendum)
Graming MD at bedside.

## 2014-12-26 NOTE — Consult Note (Signed)
ORTHOPAEDIC CONSULTATION  REQUESTING PHYSICIAN: Louellen Molder, MD  Chief Complaint: left hip pain  HPI: Hailey Keith is a 80 y.o. female who has multiple medical problems. She suffered a mechanical fall when she stood up from a chair onto the left side. She complains of pain over her left lateral hip. She has a distal radius fracture and underwent a closed reduction with Dr. Amedeo Plenty.  Past Medical History  Diagnosis Date  . COPD (chronic obstructive pulmonary disease)   . Hypertension   . Dyslipidemia   . Hard of hearing   . Hx of radiation therapy 06/18/13- 07/23/13    LUL lung primary, 7020 cGy 26 sessions  . 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 Skin cancer     basal cell  . Type II or unspecified type diabetes mellitus without mention of complication, not stated as uncontrolled    Past Surgical History  Procedure Laterality Date  . Tonsillectomy      as child, repeat surgery early 20's  . Mohs surgery      "several times"  . Facial reconstruction surgery  25 years ago    to remove skin cancer, right side of face   History   Social History  . Marital Status: Single    Spouse Name: N/A  . Number of Children: 1  . Years of Education: N/A   Occupational History  . retired    Social History Main Topics  . Smoking status: Current Every Day Smoker -- 0.50 packs/day for 60 years    Types: Cigarettes  . Smokeless tobacco: Not on file     Comment: 06/05/13 currently 1/2 PPD, trying to quit  . Alcohol Use: Yes     Comment: occ wine, 2 drinks nightly  . Drug Use: No  . Sexual Activity: Not on file   Other Topics Concern  . None   Social History Narrative   Family History  Problem Relation Age of Onset  . Hypertension    . Lung cancer Sister     tx w/radiation, met to stomach  . Pneumonia Mother     Deceased  . Heart attack Father     Deceased  . Cancer Sister     "back of neck"    Allergies  Allergen Reactions  . Paxil [Paroxetine Hcl] Other (See Comments)    UNKNOWN   Prior to Admission medications   Medication Sig Start Date End Date Taking? Authorizing Provider  albuterol (PROVENTIL HFA;VENTOLIN HFA) 108 (90 BASE) MCG/ACT inhaler 1 to 2 inhalations every 4 hours as needed for Rescue Asthma 03/31/14  Yes Unk Pinto, MD  aspirin 81 MG tablet Take 81 mg by mouth daily.   Yes Historical Provider, MD  atorvastatin (LIPITOR) 80 MG tablet Take 80 mg by mouth 2 (two) times a week. WED AND SAT   Yes Historical Provider, MD  citalopram (CELEXA) 40 MG tablet TAKE 1 TABLET BY MOUTH DAILY FOR MOOD 12/24/14  Yes Vicie Mutters, PA-C  doxazosin (CARDURA) 8 MG tablet TAKE 1 TABLET AT BEDTIME 09/30/14  Yes Unk Pinto, MD  ipratropium-albuterol (DUONEB) 0.5-2.5 (3) MG/3ML SOLN Take 3 mLs by nebulization 4 (four) times daily. 03/31/14  Yes Unk Pinto, MD  losartan (COZAAR) 100 MG tablet TAKE 1 TABLET BY MOUTH DAILY AS NEEDED FOR BLOOD PRESSURE 05/02/14  Yes Unk Pinto, MD  predniSONE (DELTASONE) 5 MG tablet Take 1 tablet  3 x daily or as directed Patient taking differently: Take 5 mg by mouth 2 (two) times daily with a meal.  11/02/14 05/05/15 Yes Unk Pinto, MD  temazepam (RESTORIL) 15 MG capsule TAKE 1 OR 2 CASPULES BY MOUTH FOR SLEEP IF NEEDED Patient not taking: Reported on 11/30/2014    Deneise Lever, MD   Dg Wrist Complete Left  12/25/2014   CLINICAL DATA:  Patient fell on to the wrist.  Pain.  EXAM: LEFT WRIST - COMPLETE 3+ VIEW  COMPARISON:  None.  FINDINGS: There is a comminuted and impacted distal radius fracture with dorsal angulation. Slight avulsion ulnar styloid process. Osseous demineralization. Soft tissue swelling.  IMPRESSION: Collies fracture distal radius.  Ulnar styloid avulsion.   Electronically Signed   By: Rolla Flatten M.D.   On: 12/25/2014 22:52   Ct Lumbar Spine Wo Contrast  12/26/2014   CLINICAL DATA:  [place], pain.  EXAM: CT LUMBAR SPINE  WITHOUT CONTRAST  TECHNIQUE: Multidetector CT imaging of the lumbar spine was performed without intravenous contrast administration. Multiplanar CT image reconstructions were also generated.  COMPARISON:  None.  FINDINGS: Dizzy moderate compression deformity at L2, inferior endplate elevation and anterior wedging. Slight retropulsion of bone measuring 5 mm. Loss of 50% vertebral body height anteriorly. This could represent an acute compression fracture. Severe osseous demineralization. Vascular calcification. Mild stenosis at the L2-L3 level related primarily to retropulsion but also mild facet arthropathy. Mild stenosis also present L4-5 related annular bulging and posterior element hypertrophy. Moderately large stool burden.  IMPRESSION: L2 compression fracture of indeterminate age. Correlate clinically for acute compression fracture related to recent fall.  Moderate stenosis at L2-3 related to 5 mm retropulsion from L2.  Mild stenosis also present L4-5.   Electronically Signed   By: Rolla Flatten M.D.   On: 12/26/2014 01:26   Ct Hip Left Wo Contrast  12/26/2014   CLINICAL DATA:  [place].  Pain.  EXAM: CT OF THE LEFT HIP WITHOUT CONTRAST  TECHNIQUE: Multidetector CT imaging of the left hip was performed according to the standard protocol. Multiplanar CT image reconstructions were also generated.  COMPARISON:  Plain films earlier today.  FINDINGS: There is an avulsion fracture of the superior aspect of the greater trochanter with slight displacement as suspected from plain films. No transverse fracture across the femoral neck or intertrochanteric region. The acetabulum and visualized pelvic bones are intact. Slight surrounding hematoma.  IMPRESSION: Avulsion fracture of the superior aspect of the greater trochanter with slight displacement. No transverse femoral neck or intertrochanteric fracture.   Electronically Signed   By: Rolla Flatten M.D.   On: 12/26/2014 00:36   Dg Hip Unilat With Pelvis 2-3 Views  Left  12/25/2014   CLINICAL DATA:  [place], pain.  EXAM: LEFT HIP (WITH PELVIS) 2-3 VIEWS  COMPARISON:  None.  FINDINGS: There is no evidence of subcapital or intertrochanteric hip fracture or dislocation. On the AP hip view, there is slight irregularity at the superior margin of greater trochanter, and a nondisplaced greater trochanteric fracture is difficult to exclude (Arrow). Acetabulum and pelvic bones appear intact. Moderate stool burden. Osseous demineralization.  IMPRESSION: Difficult to exclude a nondisplaced fracture of the greater trochanter. No transverse subcapital or intertrochanteric hip fracture is evident.   Electronically Signed   By: Rolla Flatten M.D.   On: 12/25/2014 22:55    Positive ROS: All other systems have been reviewed and were otherwise negative with the exception of those mentioned in the HPI and as above.  Labs cbc  Recent Labs  12/25/14 2325 12/26/14 0517  WBC  --  13.3*  HGB 14.6 12.2  HCT 43.0 37.9  PLT  --  197    Labs inflam No results for input(s): CRP in the last 72 hours.  Invalid input(s): ESR  Labs coag  Recent Labs  12/26/14 0517  INR 1.01     Recent Labs  12/25/14 2325 12/26/14 0517  NA 138 136  K 4.6 6.4*  CL 100 103  CO2  --  25  GLUCOSE 107* 102*  BUN 18 17  CREATININE 0.80 0.84  CALCIUM  --  8.5    Physical Exam: Filed Vitals:   12/26/14 0350  BP: 115/48  Pulse: 97  Temp: 98.2 F (36.8 C)  Resp: 20   General: Alert, no acute distress Cardiovascular: No pedal edema Respiratory: No cyanosis, no use of accessory musculature GI: No organomegaly, abdomen is soft and non-tender Skin: No lesions in the area of chief complaint other than those listed below in MSK exam.  Neurologic: Sensation intact distally Psychiatric: Patient is competent for consent with normal mood and affect Lymphatic: No axillary or cervical lymphadenopathy  MUSCULOSKELETAL:  LUE: splint C/D/I LLE: Compartments are soft she has pain with log  roll and tenderness over her greater trip. Distally she has good sensation and motor in all nerve distributions. Other extremities are atraumatic with painless ROM and NVI.  Assessment: Left greator troch minimally displaced fracture Left distal radius fracture  Plan: I will defer treatment of distal radius to Dr. Amedeo Plenty.  Nonoperative treatment of the left greater trochanteric fracture.  Touchdown weightbearing left lower extremity mobilized with platform walker if okay with Dr. Amedeo Plenty. PT VTE px: SCD's and chemical px per the primary team  F/u with me in 1wk for repeat xray. I will sign off at this time. Please call with any further questions   Renette Butters, MD Cell [phone]   12/26/2014 9:05 AM

## 2014-12-26 NOTE — Progress Notes (Signed)
Pt does not wear CPAP at home and refuses to wear one tonight

## 2014-12-26 NOTE — ED Provider Notes (Signed)
80 yo  Fell - mechanical Trochanter fracture, colles fracture Pending CT of lumbar spine Pain is controlled.  Not anticoagulated PCP [place] Plan: neg CT, admit to medicine           Pos CT, admit to trauma  CT lumbar spine showing only Lumber compression fx of questionable age. She has no complaint of back pain. Discussed with colles fracture with Dr. Amedeo Plenty who has seen her in the ED and provided closed reduction and splinting. Discussed the greater trochanteric fracture with Dr. Percell Miller who will consult on patient in the morning. [facility], Dr. Blaine Hamper, accepts the patient for admission.  Charlann Lange, PA-C 12/26/14 9977  Milton Ferguson, MD 12/26/14 2325

## 2014-12-26 NOTE — Evaluation (Signed)
Physical Therapy Evaluation Patient Details Name: MARICIA SCOTTI MRN: [id number] DOB: 1935-02-21 Today's Date: 12/26/2014   History of Present Illness  HPI: [name] is a 80 y.o. 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 L xrist Colles fx (opted for non-operative treatment), L hip greater trochanteric avulsion fx (non-operative, TDWB LLE)  Clinical Impression  Pt admitted with above diagnosis. Pt currently with functional limitations due to the deficits listed below (see PT Problem List).  Pt will benefit from skilled PT to increase their independence and safety with mobility to allow discharge to the venue listed below.       Follow Up Recommendations SNF;Supervision/Assistance - 24 hour    Equipment Recommendations   (perhaps L platform RW)    Recommendations for Other Services OT consult     Precautions / Restrictions Precautions Precautions: Fall Restrictions Weight Bearing Restrictions: Yes LUE Weight Bearing: Non weight bearing LLE Weight Bearing: Touchdown weight bearing      Mobility  Bed Mobility Overal bed mobility: +2 for physical assistance;Needs Assistance Bed Mobility: Supine to Sit     Supine to sit: +2 for physical assistance;Mod assist     General bed mobility comments: heavy mod assist to move towards EOB and elevate trunk to sitting; cues to keep ankles together to avoid active LLE abduction  Transfers Overall transfer level: Needs assistance Equipment used: 2 person hand held assist (supporting and L elbow, gait belt ) Transfers: Sit to/from [employer] Transfers Sit to Stand: +2 physical assistance;Mod assist Stand pivot transfers: +2 physical assistance;Mod assist       General transfer comment: Cues  for technqiue, close guard to keep weight off of LLE; support given at gait belt RUE and L elbow; required asist to pivot R foot for transfer to chair  Ambulation/Gait                Stairs            Wheelchair Mobility    Modified Rankin (Stroke Patients Only)       Balance Overall balance assessment: Needs assistance   Sitting balance-Leahy Scale: Fair Sitting balance - Comments: fit sling and changed gown while pt sat up     Standing balance-Leahy Scale: Zero                               Pertinent Vitals/Pain Pain Assessment: Faces Faces Pain Scale: Hurts even more Pain Location: L wrist and hip Pain Descriptors / Indicators: Grimacing Pain Intervention(s): Monitored during session;Limited activity within patient's tolerance    Home Living Family/patient expects to be discharged to:: Unsure                 Additional Comments: will likely need post-acute rehab    Prior Function Level of Independence: Independent with assistive device(s)               Hand Dominance   Dominant Hand: Right    Extremity/Trunk Assessment   Upper Extremity Assessment: LUE deficits/detail       LUE Deficits / Details: forearm in cast; able to flex and extend fingers; kept in sling during session; noted limited ability to flex shoulder (when not in sling)   Lower Extremity Assessment: LLE  deficits/detail   LLE Deficits / Details: Cued pt to  keep feet together and avoid active abduction given greater trochanter avulsion fx; knee, ankle [facility]     Communication   Communication: No difficulties;Other (comment) (at times slow to respond to questions)  Cognition Arousal/Alertness: Awake/alert Behavior During Therapy: WFL for tasks assessed/performed Overall Cognitive Status: No family/caregiver present to determine baseline cognitive functioning (at times very slow to answer questions)                      General Comments       Exercises        Assessment/Plan    PT Assessment Patient needs continued PT services  PT Diagnosis Difficulty walking;Generalized weakness;Acute pain   PT Problem List Decreased strength;Decreased range of motion;Decreased activity tolerance;Decreased balance;Decreased mobility;Decreased coordination;Decreased cognition;Decreased knowledge of use of DME;Decreased safety awareness;Pain;Decreased knowledge of precautions  PT Treatment Interventions DME instruction;Gait training;Functional mobility training;Therapeutic activities;Therapeutic exercise;Balance training;Cognitive remediation;Patient/family education   PT Goals (Current goals can be found in the Care Plan section) Acute Rehab PT Goals Patient Stated Goal: agreeable to OOB PT Goal Formulation: With patient Time For Goal Achievement: 01/09/15    Frequency Min 3X/week   Barriers to discharge Decreased caregiver support Need a better picture of home situation and available assist    Co-evaluation               End of Session Equipment Utilized During Treatment: Gait belt Activity Tolerance: Patient limited by pain Patient left: in chair;with call bell/phone within reach;with chair alarm set Nurse Communication: Mobility status         Time: 2979-8921 PT Time Calculation (min) (ACUTE ONLY): 18 min   Charges:   PT Evaluation $Initial PT Evaluation Tier I: 1 Procedure     PT G CodesQuin Hoop 12/26/2014, 12:58 PM  Roney [place], [place] Pager [phone] Office [phone]

## 2014-12-26 NOTE — Consult Note (Signed)
Reason for Consult: Displaced fracture left wrist Referring Physician: ER staff  Hailey Keith is an 80 y.o. female.  HPI: Status post fall with displaced left wrist fracture. She 67 use of age. She is fairly poor health.  She states her back and hip are painful. She also complains of left wrist pain and this is appropriate to her injury. She denies right upper extremity pain complaints.   Past Medical History  Diagnosis Date  . COPD (chronic obstructive pulmonary disease)   . Hypertension   . Dyslipidemia   . Hard of hearing   . Hx of radiation therapy 06/18/13- 07/23/13    LUL lung primary, 7020 cGy 26 sessions  . 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 Skin cancer     basal cell  . Type II or unspecified type diabetes mellitus without mention of complication, not stated as uncontrolled     Past Surgical History  Procedure Laterality Date  . Tonsillectomy      as child, repeat surgery early 20's  . Mohs surgery      "several times"  . Facial reconstruction surgery  25 years ago    to remove skin cancer, right side of face    Family History  Problem Relation Age of Onset  . Hypertension    . Lung cancer Sister     tx w/radiation, met to stomach  . Pneumonia Mother     Deceased  . Heart attack Father     Deceased  . Cancer Sister     "back of neck"    Social History:  reports that she has been smoking Cigarettes.  She has a 30 pack-year smoking history. She does not have any smokeless tobacco history on file. She reports that she drinks alcohol. She reports that she does not use illicit drugs.  Allergies:  Allergies  Allergen Reactions  . Paxil [Paroxetine Hcl] Other (See Comments)    UNKNOWN    Medications: I have reviewed the patient's current medications.  Results for orders placed or performed during the hospital encounter of 12/25/14 (from the past 48 hour(s))  I-stat chem 8, ed     Status:  Abnormal   Collection Time: 12/25/14 11:25 PM  Result Value Ref Range   Sodium 138 135 - 145 mmol/L   Potassium 4.6 3.5 - 5.1 mmol/L   Chloride 100 96 - 112 mmol/L   BUN 18 6 - 23 mg/dL   Creatinine, Ser 0.80 0.50 - 1.10 mg/dL   Glucose, Bld 107 (H) 70 - 99 mg/dL   Calcium, Ion 1.22 1.13 - 1.30 mmol/L   TCO2 27 0 - 100 mmol/L   Hemoglobin 14.6 12.0 - 15.0 g/dL   HCT 43.0 36.0 - 46.0 %    Dg Wrist Complete Left  12/25/2014   CLINICAL DATA:  Patient fell on to the wrist.  Pain.  EXAM: LEFT WRIST - COMPLETE 3+ VIEW  COMPARISON:  None.  FINDINGS: There is a comminuted and impacted distal radius fracture with dorsal angulation. Slight avulsion ulnar styloid process. Osseous demineralization. Soft tissue swelling.  IMPRESSION: Collies fracture distal radius.  Ulnar styloid avulsion.   Electronically Signed   By: Rolla Flatten M.D.   On: 12/25/2014 22:52   Ct Lumbar Spine Wo Contrast  12/26/2014   CLINICAL DATA:  [place], pain.  EXAM: CT LUMBAR SPINE WITHOUT CONTRAST  TECHNIQUE: Multidetector CT imaging of the lumbar  spine was performed without intravenous contrast administration. Multiplanar CT image reconstructions were also generated.  COMPARISON:  None.  FINDINGS: Dizzy moderate compression deformity at L2, inferior endplate elevation and anterior wedging. Slight retropulsion of bone measuring 5 mm. Loss of 50% vertebral body height anteriorly. This could represent an acute compression fracture. Severe osseous demineralization. Vascular calcification. Mild stenosis at the L2-L3 level related primarily to retropulsion but also mild facet arthropathy. Mild stenosis also present L4-5 related annular bulging and posterior element hypertrophy. Moderately large stool burden.  IMPRESSION: L2 compression fracture of indeterminate age. Correlate clinically for acute compression fracture related to recent fall.  Moderate stenosis at L2-3 related to 5 mm retropulsion from L2.  Mild stenosis also present L4-5.    Electronically Signed   By: Rolla Flatten M.D.   On: 12/26/2014 01:26   Ct Hip Left Wo Contrast  12/26/2014   CLINICAL DATA:  [place].  Pain.  EXAM: CT OF THE LEFT HIP WITHOUT CONTRAST  TECHNIQUE: Multidetector CT imaging of the left hip was performed according to the standard protocol. Multiplanar CT image reconstructions were also generated.  COMPARISON:  Plain films earlier today.  FINDINGS: There is an avulsion fracture of the superior aspect of the greater trochanter with slight displacement as suspected from plain films. No transverse fracture across the femoral neck or intertrochanteric region. The acetabulum and visualized pelvic bones are intact. Slight surrounding hematoma.  IMPRESSION: Avulsion fracture of the superior aspect of the greater trochanter with slight displacement. No transverse femoral neck or intertrochanteric fracture.   Electronically Signed   By: Rolla Flatten M.D.   On: 12/26/2014 00:36   Dg Hip Unilat With Pelvis 2-3 Views Left  12/25/2014   CLINICAL DATA:  [place], pain.  EXAM: LEFT HIP (WITH PELVIS) 2-3 VIEWS  COMPARISON:  None.  FINDINGS: There is no evidence of subcapital or intertrochanteric hip fracture or dislocation. On the AP hip view, there is slight irregularity at the superior margin of greater trochanter, and a nondisplaced greater trochanteric fracture is difficult to exclude (Arrow). Acetabulum and pelvic bones appear intact. Moderate stool burden. Osseous demineralization.  IMPRESSION: Difficult to exclude a nondisplaced fracture of the greater trochanter. No transverse subcapital or intertrochanteric hip fracture is evident.   Electronically Signed   By: Rolla Flatten M.D.   On: 12/25/2014 22:55    Review of Systems  Constitutional: Negative.   HENT: Negative.   Respiratory:       Long history of smoking dependence  Genitourinary: Negative.   Musculoskeletal:       See chart left wrist fracture  Psychiatric/Behavioral: Negative.    Blood pressure 138/58, pulse  89, temperature 98.2 F (36.8 C), temperature source Oral, resp. rate 21, height $RemoveBe'5\' 4"'OmOCxmRVc$  (1.626 m), weight 63.504 kg (140 lb), SpO2 97 %. Physical Exam swollen displaced left wrist fracture with intact refill and soft compartments left upper extremity. Shoulder elbow and upper arm are nontender bilaterally. Right upper extremity is neurovascularly intact.  She is thin frail and has a nontender abdomen and equal chest expansion.  She has the ability to move her toes and ankles but complains of hip pain is appropriate to her diagnoses  Assessment/Plan: Displaced left wrist fracture.  I discussed with patient her findings. I do not feel she is a good surgical candidate given her health , age,  smoking habitus and other parameters.  I discussed with she and her family closed reduction and they desired to proceed.  Procedure note patient was given  a hematoma block under sterile conditions by myself. Following this she underwent closed reduction. We placed her in fingertrap traction and perform live fluoroscopy and AP lateral and oblique imaging. This revealed excellent reduction. She was then placed in a sugar tong splint with 3. mold.  I would expect some settling of her fracture however I would live with this as opposed to aggressive surgical endeavors.  The patient and her family understand and agree.  We'll monitor her while she is in the hospital. We'll need to see her in a week to 10 days for follow-up care plan  It was a pleasure to see her tonight  Paulene Floor 12/26/2014, 2:09 AM

## 2014-12-26 NOTE — Progress Notes (Signed)
CRITICAL VALUE ALERT  Critical value received:  K+ 6.4  Date of notification: 12/26/14  Time of notification: 07:09  Critical value read back:  Yes   Nurse who received alert:  Alfonso Ellis,  MD notified (1st page):  Dr. Clementeen Graham  Time of first page:  07:10  MD notified (2nd page):  NA       Time of second page: NA  Responding MD:  NA  Time MD responded:  NA

## 2014-12-26 NOTE — ED Notes (Signed)
Attempted to call CT r/t delay in CT results, no answer.

## 2014-12-26 NOTE — H&P (Signed)
[facility] History and Physical  [name] GTX:[id number] DOB: 01/26/1935 DOA: 12/25/2014  Referring physician: ED physician PCP: Alesia Richards, MD  Specialists:   Chief Complaint: Pain over left arm and hip after fall  HPI: Hailey Keith is a 80 y.o. 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  Patient states she was sitting on her back porch for some fresh air and was sitting in a tall chair. She tripped and fell out of the chair onto her left side when she stoop up.  She instantly felt pain in her left hip and her left wrist. She denies feeling light-headed or dizziness before or after the fall. She denies LOC.  She denies drinking any alcohol tonight. Patient reports that she has been having mild cough with white mucous production.  ROS: currently patient denies fever, chills, fatigue, running nose, ear pain, headaches, chest pain, SOB, abdominal pain, diarrhea, constipation, dysuria, urgency, frequency, hematuria, skin rashes or leg swelling. No unilateral weakness, numbness or tingling sensations. No vision change or hearing loss.  In ED, patient was found to have left Collies fracture and left hip fracture by X-ray. Temperature normal, electrolytes okay, no tachycardia. Patient is admitted to the inpatient for further evaluation and treatment. Orthopedic surgeon was consulted by ED.  Review of Systems: As presented in the history of presenting illness, rest negative.  Where does patient live?  At home Can patient participate in ADLs? little  Allergy:  Allergies  Allergen Reactions  . Paxil [Paroxetine Hcl] Other (See Comments)    UNKNOWN    Past Medical History  Diagnosis Date  . COPD (chronic obstructive pulmonary disease)    . Hypertension   . Dyslipidemia   . Hard of hearing   . Hx of radiation therapy 06/18/13- 07/23/13    LUL lung primary, 7020 cGy 26 sessions  . 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 Skin cancer     basal cell  . Type II or unspecified type diabetes mellitus without mention of complication, not stated as uncontrolled     Past Surgical History  Procedure Laterality Date  . Tonsillectomy      as child, repeat surgery early 20's  . Mohs surgery      "several times"  . Facial reconstruction surgery  25 years ago    to remove skin cancer, right side of face    Social History:  reports that she has been smoking Cigarettes.  She has a 30 pack-year smoking history. She does not have any smokeless tobacco history on file. She reports that she drinks alcohol. She reports that she does not use illicit drugs.  Family History:  Family History  Problem Relation Age of Onset  . Hypertension    . Lung cancer Sister     tx w/radiation, met to stomach  . Pneumonia Mother     Deceased  . Heart attack Father     Deceased  . Cancer Sister     "back of neck"     Prior to Admission medications   Medication Sig Start Date End Date Taking? Authorizing Provider  albuterol (PROVENTIL HFA;VENTOLIN HFA) 108 (90 BASE) MCG/ACT inhaler 1 to 2 inhalations every 4 hours as needed  for Rescue Asthma 03/31/14  Yes Unk Pinto, MD  aspirin 81 MG tablet Take 81 mg by mouth daily.   Yes Historical Provider, MD  atorvastatin (LIPITOR) 80 MG tablet Take 80 mg by mouth 2 (two) times a week. WED AND SAT   Yes Historical Provider, MD  citalopram (CELEXA) 40 MG tablet TAKE 1 TABLET BY MOUTH DAILY FOR MOOD 12/24/14  Yes Vicie Mutters, PA-C  doxazosin (CARDURA) 8 MG tablet TAKE 1 TABLET AT BEDTIME 09/30/14  Yes Unk Pinto, MD  ipratropium-albuterol (DUONEB) 0.5-2.5 (3) MG/3ML SOLN Take 3 mLs by nebulization 4 (four) times daily. 03/31/14  Yes Unk Pinto, MD  losartan (COZAAR) 100 MG tablet TAKE 1 TABLET BY MOUTH DAILY AS NEEDED FOR BLOOD PRESSURE 05/02/14  Yes Unk Pinto, MD  predniSONE (DELTASONE) 5 MG tablet Take 1 tablet 3 x daily or as directed Patient taking differently: Take 5 mg by mouth 2 (two) times daily with a meal.  11/02/14 05/05/15 Yes Unk Pinto, MD  temazepam (RESTORIL) 15 MG capsule TAKE 1 OR 2 CASPULES BY MOUTH FOR SLEEP IF NEEDED Patient not taking: Reported on 11/30/2014    Deneise Lever, MD    Physical Exam: Filed Vitals:   12/26/14 0145 12/26/14 0200 12/26/14 0300 12/26/14 0350  BP: 121/56 116/61 114/41 115/48  Pulse: 96 98 99 97  Temp:    98.2 F (36.8 C)  TempSrc:    Oral  Resp: _0 Height:    5' 4" (1.626 m)  Weight:    63.5 kg (139 lb 15.9 oz)  SpO2: 95% 95% 93% 97%   General: Not in acute distress HEENT:       Eyes: PERRL, EOMI, no scleral icterus       ENT: No discharge from the ears and nose, no pharynx injection, no tonsillar enlargement.        Neck: No JVD, no bruit, no mass felt. Cardiac: S1/S2, RRR, No murmurs, No gallops or rubs Pulm: Has mild wheezing, no rales or rubs. Abd: Soft, nondistended, nontender, no rebound pain, no organomegaly, BS present Ext: No edema bilaterally. 2+DP/PT pulse bilaterally Musculoskeletal: tenderness over left wrist and hip. Mild deformity noted to left wrist, N/V intact. Decreased external rotation range of motion due to pain in left hip  Skin: No rashes.  Neuro: Alert and oriented X3, cranial nerves II-XII grossly intact, muscle strength 5/5 in all extremeties, sensation to light touch intact. Brachial reflex 2+ bilaterally. Knee reflex 1+ bilaterally. Negative Babinski's sign. Normal finger to nose test. Psych: Patient is not psychotic, no suicidal or hemocidal ideation.  Labs on Admission:  Basic Metabolic Panel:  Recent Labs Lab 12/25/14 2325  NA 138  K 4.6  CL 100  GLUCOSE 107*  BUN 18  CREATININE 0.80   Liver Function  Tests: No results for input(s): AST, ALT, ALKPHOS, BILITOT, PROT, ALBUMIN in the last 168 hours. No results for input(s): LIPASE, AMYLASE in the last 168 hours. No results for input(s): AMMONIA in the last 168 hours. CBC:  Recent Labs Lab 12/25/14 2325  HGB 14.6  HCT 43.0   Cardiac Enzymes: No results for input(s): CKTOTAL, CKMB, CKMBINDEX, TROPONINI in the last 168 hours.  BNP (last 3 results) No results for input(s): BNP in the last 8760 hours.  ProBNP (last 3 results) No results for input(s): PROBNP in the last 8760 hours.  CBG: No results for input(s): GLUCAP in the last 168 hours.  Radiological Exams on Admission: Dg Wrist Complete  Left  12/25/2014   CLINICAL DATA:  Patient fell on to the wrist.  Pain.  EXAM: LEFT WRIST - COMPLETE 3+ VIEW  COMPARISON:  None.  FINDINGS: There is a comminuted and impacted distal radius fracture with dorsal angulation. Slight avulsion ulnar styloid process. Osseous demineralization. Soft tissue swelling.  IMPRESSION: Collies fracture distal radius.  Ulnar styloid avulsion.   Electronically Signed   By: Rolla Flatten M.D.   On: 12/25/2014 22:52   Ct Lumbar Spine Wo Contrast  12/26/2014   CLINICAL DATA:  [place], pain.  EXAM: CT LUMBAR SPINE WITHOUT CONTRAST  TECHNIQUE: Multidetector CT imaging of the lumbar spine was performed without intravenous contrast administration. Multiplanar CT image reconstructions were also generated.  COMPARISON:  None.  FINDINGS: Dizzy moderate compression deformity at L2, inferior endplate elevation and anterior wedging. Slight retropulsion of bone measuring 5 mm. Loss of 50% vertebral body height anteriorly. This could represent an acute compression fracture. Severe osseous demineralization. Vascular calcification. Mild stenosis at the L2-L3 level related primarily to retropulsion but also mild facet arthropathy. Mild stenosis also present L4-5 related annular bulging and posterior element hypertrophy. Moderately large stool  burden.  IMPRESSION: L2 compression fracture of indeterminate age. Correlate clinically for acute compression fracture related to recent fall.  Moderate stenosis at L2-3 related to 5 mm retropulsion from L2.  Mild stenosis also present L4-5.   Electronically Signed   By: Rolla Flatten M.D.   On: 12/26/2014 01:26   Ct Hip Left Wo Contrast  12/26/2014   CLINICAL DATA:  [place].  Pain.  EXAM: CT OF THE LEFT HIP WITHOUT CONTRAST  TECHNIQUE: Multidetector CT imaging of the left hip was performed according to the standard protocol. Multiplanar CT image reconstructions were also generated.  COMPARISON:  Plain films earlier today.  FINDINGS: There is an avulsion fracture of the superior aspect of the greater trochanter with slight displacement as suspected from plain films. No transverse fracture across the femoral neck or intertrochanteric region. The acetabulum and visualized pelvic bones are intact. Slight surrounding hematoma.  IMPRESSION: Avulsion fracture of the superior aspect of the greater trochanter with slight displacement. No transverse femoral neck or intertrochanteric fracture.   Electronically Signed   By: Rolla Flatten M.D.   On: 12/26/2014 00:36   Dg Hip Unilat With Pelvis 2-3 Views Left  12/25/2014   CLINICAL DATA:  [place], pain.  EXAM: LEFT HIP (WITH PELVIS) 2-3 VIEWS  COMPARISON:  None.  FINDINGS: There is no evidence of subcapital or intertrochanteric hip fracture or dislocation. On the AP hip view, there is slight irregularity at the superior margin of greater trochanter, and a nondisplaced greater trochanteric fracture is difficult to exclude (Arrow). Acetabulum and pelvic bones appear intact. Moderate stool burden. Osseous demineralization.  IMPRESSION: Difficult to exclude a nondisplaced fracture of the greater trochanter. No transverse subcapital or intertrochanteric hip fracture is evident.   Electronically Signed   By: Rolla Flatten M.D.   On: 12/25/2014 22:55    EKG: Independently reviewed.   Abnormal findings:  Will get one.   Assessment/Plan Principal Problem:   Hip fracture, left Active Problems:   Mixed hyperlipidemia   TOBACCO ABUSE   Depression   Essential hypertension   COPD (chronic obstructive pulmonary disease)   Bronchogenic carcinoma squamous cell type   Vitamin D deficiency   Colles' fracture   Distal radius fracture, left   Hip fracture   Fall  Left hip and wrist fracture: As evidenced by x-ray. Patient has moderate pain  now. No neurovascular compromise. Orthopedic surgeon was consulted.   - will admit to tele bed - Pain control: morphine prn and percocet - follow up ortho recs - NPO after MN - type and cross - INR/PTT  COPD: Mildly exacerbated. Patient has cough and wheezing on lung auscultation. Patient does not have significant shortness of breath, no chest pain. -Nebulizers: scheduled Duoneb and prn albuterol -increase home prednisone dose from 5 mg bid to 50 mg daily -Mucinex for cough  -CXR in am  Hypertension: -Cozaar -Patient is also on Doxazosin with unclear reason.  Hyperlipidemia: TSH was 1.905 on 11/02/14. -Continue Lipitor  Depression: Stable. No suicidal or homicidal ideations. -Celexa  Tobacco abuse -Nicotine patch    DVT ppx: SQ Heparin       Code Status: Full code Family Communication: None at bed side.   Disposition Plan: Admit to inpatient   Date of Service 12/26/2014    Ivor Costa [facility] Pager [phone]  If 7PM-7AM, please contact night-coverage [email or web address] Password [facility] 12/26/2014, 5:56 AM

## 2014-12-27 ENCOUNTER — Inpatient Hospital Stay (HOSPITAL_COMMUNITY): Payer: Medicare Other

## 2014-12-27 DIAGNOSIS — S52502D Unspecified fracture of the lower end of left radius, subsequent encounter for closed fracture with routine healing: Secondary | ICD-10-CM

## 2014-12-27 DIAGNOSIS — J441 Chronic obstructive pulmonary disease with (acute) exacerbation: Secondary | ICD-10-CM

## 2014-12-27 LAB — GLUCOSE, CAPILLARY: Glucose-Capillary: 99 mg/dL (ref 70–99)

## 2014-12-27 NOTE — Evaluation (Addendum)
Occupational Therapy Evaluation Patient Details Name: Hailey Keith MRN: [id number] DOB: [birth year] Today's Date: 12/27/2014    History of Present Illness HPI: [name] is a 80 y.o. 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 L xrist Colles fx (opted for non-operative treatment), L hip greater trochanteric avulsion fx (non-operative, TDWB LLE)   Clinical Impression   Patient mod I PTA. Patient currently functioning at an overall mod/max>total assist level. Patient will benefit from acute OT to increase overall independence in the areas of ADLs, functional mobility, pain management, and overall safety in order to safely discharge to venue listed below. Educated patient on importance of elevation > LUE and flexion/extension of fingers. At end of this OT session, elevated LUE on two pillows and administered hand/finger support for patient to work on flexion/extension of her fingers.     Follow Up Recommendations  SNF;Supervision/Assistance - 24 hour    Equipment Recommendations  Other (comment) (TBD next venue of care)    Recommendations for Other Services  None at this time   Precautions / Restrictions Precautions Precautions: Fall Restrictions Weight Bearing Restrictions: Yes LUE Weight Bearing: Non weight bearing LLE Weight Bearing: Touchdown weight bearing      Mobility Bed Mobility Overal bed mobility: Needs Assistance Bed Mobility: Rolling;Sidelying to Sit;Sit to Supine Rolling: Mod assist Sidelying to sit: Max assist   Sit to supine: Max assist   General bed mobility comments: Multimodal cueing needed for bed mobility tasks. Patient mod assist for rolling and max assist for sit<>supine.   Transfers General transfer comment: Did not  occur during OT eval/treat, please see PT eval for more information     Balance Overall balance assessment: Needs assistance   Sitting balance-Leahy Scale: Poor Sitting balance - Comments: patient with right lateral lean, due to bed? pain?     ADL Overall ADL's : Needs assistance/impaired General ADL Comments: Patient overall total assist for functional ADL tasks at this time. ADL at bed level due to increased pain and overall weakness. Patient with difficult time raising LUE without providing active assistance. Patient able to work on bed mobility and sitting EOB, but unable to transfer due to pain this session.     Pertinent Vitals/Pain Pain Assessment: 0-10 Pain Score: 8  Pain Location: left wrist Pain Descriptors / Indicators: Aching Pain Intervention(s): Monitored during session     Hand Dominance Right   Extremity/Trunk Assessment Upper Extremity Assessment Upper Extremity Assessment: LUE deficits/detail LUE Deficits / Details: Forearm in cast, patient able to flex/extend fingers. Pt able to flex shoulder with AAROM.  LUE: Unable to fully assess due to immobilization   Lower Extremity Assessment Lower Extremity Assessment: Defer to PT evaluation   Cervical / Trunk Assessment Cervical / Trunk Assessment: Normal   Communication Communication Communication: HOH   Cognition Arousal/Alertness: Awake/alert Behavior During Therapy: WFL for tasks assessed/performed Overall Cognitive Status: No family/caregiver present to determine baseline cognitive functioning             Home Living Family/patient expects to be discharged to:: Unsure Additional Comments: will likely need post-acute rehab      Prior Functioning/Environment Level of Independence: Independent with assistive device(s)      OT Diagnosis: Generalized weakness;Acute pain   OT Problem List: Decreased strength;Decreased range of motion;Decreased activity tolerance;Impaired balance (sitting  and/or  standing);Decreased coordination;Decreased safety awareness;Decreased knowledge of use of DME or AE;Decreased knowledge of precautions;Pain;Impaired UE functional use   OT Treatment/Interventions: Self-care/ADL training;Energy conservation;Balance training;DME and/or AE instruction;Therapeutic activities;Patient/family education    OT Goals(Current goals can be found in the care plan section) Acute Rehab OT Goals Patient Stated Goal: none stated OT Goal Formulation: With patient Time For Goal Achievement: 01/03/15 Potential to Achieve Goals: Good ADL Goals Pt Will Perform Grooming: with set-up;sitting (EOB) Pt Will Perform Upper Body Bathing: sitting;with min assist (seated EOB) Pt Will Perform Lower Body Bathing: with mod assist;sit to/from stand;with adaptive equipment Pt Will Perform Upper Body Dressing: with min assist;sitting (seated EOB) Pt Will Perform Lower Body Dressing: with mod assist;sit to/from stand;with adaptive equipment Pt Will Transfer to Toilet: with min assist;bedside commode;stand pivot transfer Pt Will Perform Toileting - Clothing Manipulation and hygiene: with min assist;sit to/from stand  OT Frequency: Min 2X/week   Barriers to D/C: None known at this time   End of Session Activity Tolerance: Patient limited by pain Patient left: in bed;with call bell/phone within reach;with bed alarm set   Time: 0761-5183 OT Time Calculation (min): 25 min Charges:  OT General Charges $OT Visit: 1 Procedure OT Evaluation $Initial OT Evaluation Tier I: 1 Procedure OT Treatments $Self Care/Home Management : 8-22 mins  Howell Groesbeck , MS, OTR/L, CLT Pager: [phone]  12/27/2014, 8:57 AM

## 2014-12-27 NOTE — Progress Notes (Signed)
Subjective: The patient is awake this morning she is eating breakfast. She states that she is sore but does not have significant complaints at this juncture. She denies fever or chills, she denies shortness of breath or chest pain.   Objective: Vital signs in last 24 hours: Temp:  [98.3 F (36.8 C)-98.8 F (37.1 C)] 98.7 F (37.1 C) (04/25 0508) Pulse Rate:  [87-99] 92 (04/25 0508) Resp:  [18-20] 18 (04/25 0508) BP: (94-125)/(38-50) 125/50 mmHg (04/25 0508) SpO2:  [94 %-98 %] 98 % (04/25 0726)  Intake/Output from previous day: 04/24 0701 - 04/25 0700 In: 480 [P.O.:480] Out: -  Intake/Output this shift:     Recent Labs  12/25/14 2325 12/26/14 0517  HGB 14.6 12.2    Recent Labs  12/25/14 2325 12/26/14 0517  WBC  --  13.3*  RBC  --  3.99  HCT 43.0 37.9  PLT  --  197    Recent Labs  12/25/14 2325 12/26/14 0517 12/26/14 1238  NA 138 136  --   K 4.6 6.4* 3.9  CL 100 103  --   CO2  --  25  --   BUN 18 17  --   CREATININE 0.80 0.84  --   GLUCOSE 107* 102*  --   CALCIUM  --  8.5  --     Recent Labs  12/26/14 0517  INR 1.01    Examination: She is pleasant, conversant and oriented HEENT: Atraumatic : Chest: Good expansions are present Left upper extremity: Splint is clean dry and intact, she has mild edema of the digits however range of motion is intact and gross sensation is intact. Refill is intact.  Assessment/Plan: Patient Active Problem List   Diagnosis Date Noted  . Colles' fracture 12/26/2014  . Hip fracture, left 12/26/2014  . Distal radius fracture, left 12/26/2014  . Hip fracture 12/26/2014  . Fall   . Prediabetes 09/30/2013  . Vitamin D deficiency 09/30/2013  . Medication management 09/30/2013  . Insomnia secondary to anxiety 05/25/2013  . Bronchogenic carcinoma squamous cell type 05/14/2013  . Mixed hyperlipidemia 06/21/2007  . TOBACCO ABUSE 06/21/2007  . Depression 06/21/2007  . Essential hypertension 06/21/2007  . COPD (chronic  obstructive pulmonary disease) 06/21/2007  . Hypoxemia 06/21/2007   We will plan for a continued conservative course for her left wrist fracture status post reduction. Once again we have discussed with her fracture will have a propensity to settle and have some degree of progressive collapse however given her overall health comorbidities conservative measures will be adhered to. She is in agreement with this. We will continue to plan for elevation, edema control and range of motion of the digits.   Shevaun Lovan L 12/27/2014, 8:18 AM

## 2014-12-27 NOTE — Progress Notes (Signed)
[facility] PROGRESS NOTE  [name] GHW:[id number] DOB: 1935-06-10 DOA: 12/25/2014 PCP: Alesia Richards, MD   Brief narrative 80 year old female with history of hypertension, hyperlipidemia, depression, COPD with history of lung cancer (left upper lobe squamous cell, status post radiation therapy), ongoing tobacco use presented with fall at home with left colles wrist fracture and  left greater trochanter fracture with slight displacement. Patient also found to have age indeterminate L2 compression fracture.     Assessment/Plan: Avulsion fracture of the left greater trochanter - nonoperative treatment per ortho. Test on weightbearing with platform walker. - physical therapy recommends skilled nursing facility. Follow-up with Dr. Percell Miller in 1 week. -Pain control with when necessary Percocet.  Left colles fracture Closed reduction done by hand surgeon upon admission as patient thought to be a poor surgical candidate given her significant comorbidities. Continue pain control, hand elevation. Follow with Dr Amedeo Plenty as outpt.  COPD with exacerbation Placed  on scheduled nebs given active wheezing. On oral prednisone at home and switched to 50 mg daily. Supportive care with antitussives. We'll add Levaquin. Counseled strongly on smoking cessation. nicotine patch.   Hyperkalemia treated and Resolved.  L2 compression fracture Acute vs chronic. Asymptmoiatic. continue PT.   Essential hypertension Continue Cozaar  Hyperlipidemia Continue Lipitor   Diet: Heart healthy  DVT prophylaxis: Subcutaneous Lovenox   Code Status: Full code Family Communication: None at bedside Disposition Plan: Skilled nursing facility possibly in 24    Consultants:  Dr. Percell Miller (orthopedics)  Dr. Amedeo Plenty ( hand surgery)  Procedures:  Closed reduction of left   Antibiotics:  NONE  HPI/Subjective: Seen and examined. Admission H&P reviewed.  Objective: Filed Vitals:    12/27/14 1314  BP: 145/57  Pulse: 73  Temp: 99.5 F (37.5 C)  Resp: 18    Intake/Output Summary (Last 24 hours) at 12/27/14 1712 Last data filed at 12/27/14 1701  Gross per 24 hour  Intake    640 ml  Output      0 ml  Net    640 ml   Filed Weights   12/25/14 2126 12/26/14 0350  Weight: 63.504 kg (140 lb) 63.5 kg (139 lb 15.9 oz)    Exam:   General:  Elderly female in no acute distress,  HEENT: No pallor, moist oral mucosa, supple neck  Chest: wheezing bilaterally, no rhonchi or crackles  CVS: Normal S1 and S2, no murmurs or gallop  GI: Soft, nondistended, nontender, bowel sounds present  Musculoskeletal: Dressing over left forearm, limited range of motion of left hip  CNS: Alert and oriented  Data Reviewed: Basic Metabolic Panel:  Recent Labs Lab 12/25/14 2325 12/26/14 0517 12/26/14 1238  NA 138 136  --   K 4.6 6.4* 3.9  CL 100 103  --   CO2  --  25  --   GLUCOSE 107* 102*  --   BUN 18 17  --   CREATININE 0.80 0.84  --   CALCIUM  --  8.5  --    Liver Function Tests:  Recent Labs Lab 12/26/14 0517  AST 43*  ALT 19  ALKPHOS 60  BILITOT 1.4*  PROT 5.0*  ALBUMIN 3.1*   No results for input(s): LIPASE, AMYLASE in the last 168 hours. No results for input(s): AMMONIA in the last 168 hours. CBC:  Recent Labs Lab 12/25/14 2325 12/26/14 0517  WBC  --  13.3*  HGB 14.6 12.2  HCT 43.0 37.9  MCV  --  95.0  PLT  --  197  Cardiac Enzymes: No results for input(s): CKTOTAL, CKMB, CKMBINDEX, TROPONINI in the last 168 hours. BNP (last 3 results) No results for input(s): BNP in the last 8760 hours.  ProBNP (last 3 results) No results for input(s): PROBNP in the last 8760 hours.  CBG:  Recent Labs Lab 12/26/14 0931 12/26/14 1021 12/26/14 1622 12/27/14 0635  GLUCAP 64* 107* 165* 99    No results found for this or any previous visit (from the past 240 hour(s)).   Studies: Dg Chest 2 View  12/27/2014   CLINICAL DATA:  Cough today. History  of lung cancer with radiation therapy. Initial encounter.  EXAM: CHEST  2 VIEW  COMPARISON:  Radiographs 05/20/2013.  CT 11/23/2014.  FINDINGS: The heart size and mediastinal contours are stable. There is stable scarring in the left upper lobe and in the right middle lobe. No superimposed airspace disease, edema or significant pleural effusion identified. The bones appear unchanged.  IMPRESSION: Radiographically stable appearance of the chest with sequela of prior radiation therapy. No evidence of superimposed pneumonia.   Electronically Signed   By: Richardean Sale M.D.   On: 12/27/2014 09:15   Dg Wrist Complete Left  12/25/2014   CLINICAL DATA:  Patient fell on to the wrist.  Pain.  EXAM: LEFT WRIST - COMPLETE 3+ VIEW  COMPARISON:  None.  FINDINGS: There is a comminuted and impacted distal radius fracture with dorsal angulation. Slight avulsion ulnar styloid process. Osseous demineralization. Soft tissue swelling.  IMPRESSION: Collies fracture distal radius.  Ulnar styloid avulsion.   Electronically Signed   By: Rolla Flatten M.D.   On: 12/25/2014 22:52   Ct Lumbar Spine Wo Contrast  12/26/2014   CLINICAL DATA:  [place], pain.  EXAM: CT LUMBAR SPINE WITHOUT CONTRAST  TECHNIQUE: Multidetector CT imaging of the lumbar spine was performed without intravenous contrast administration. Multiplanar CT image reconstructions were also generated.  COMPARISON:  None.  FINDINGS: Dizzy moderate compression deformity at L2, inferior endplate elevation and anterior wedging. Slight retropulsion of bone measuring 5 mm. Loss of 50% vertebral body height anteriorly. This could represent an acute compression fracture. Severe osseous demineralization. Vascular calcification. Mild stenosis at the L2-L3 level related primarily to retropulsion but also mild facet arthropathy. Mild stenosis also present L4-5 related annular bulging and posterior element hypertrophy. Moderately large stool burden.  IMPRESSION: L2 compression fracture of  indeterminate age. Correlate clinically for acute compression fracture related to recent fall.  Moderate stenosis at L2-3 related to 5 mm retropulsion from L2.  Mild stenosis also present L4-5.   Electronically Signed   By: Rolla Flatten M.D.   On: 12/26/2014 01:26   Ct Hip Left Wo Contrast  12/26/2014   CLINICAL DATA:  [place].  Pain.  EXAM: CT OF THE LEFT HIP WITHOUT CONTRAST  TECHNIQUE: Multidetector CT imaging of the left hip was performed according to the standard protocol. Multiplanar CT image reconstructions were also generated.  COMPARISON:  Plain films earlier today.  FINDINGS: There is an avulsion fracture of the superior aspect of the greater trochanter with slight displacement as suspected from plain films. No transverse fracture across the femoral neck or intertrochanteric region. The acetabulum and visualized pelvic bones are intact. Slight surrounding hematoma.  IMPRESSION: Avulsion fracture of the superior aspect of the greater trochanter with slight displacement. No transverse femoral neck or intertrochanteric fracture.   Electronically Signed   By: Rolla Flatten M.D.   On: 12/26/2014 00:36   Dg Hip Unilat With Pelvis 2-3 Views Left  12/25/2014   CLINICAL DATA:  [place], pain.  EXAM: LEFT HIP (WITH PELVIS) 2-3 VIEWS  COMPARISON:  None.  FINDINGS: There is no evidence of subcapital or intertrochanteric hip fracture or dislocation. On the AP hip view, there is slight irregularity at the superior margin of greater trochanter, and a nondisplaced greater trochanteric fracture is difficult to exclude ([place]). Acetabulum and pelvic bones appear intact. Moderate stool burden. Osseous demineralization.  IMPRESSION: Difficult to exclude a nondisplaced fracture of the greater trochanter. No transverse subcapital or intertrochanteric hip fracture is evident.   Electronically Signed   By: Rolla Flatten M.D.   On: 12/25/2014 22:55    Scheduled Meds: . aspirin EC  81 mg Oral Daily  . [START ON 12/29/2014]  atorvastatin  80 mg Oral Once per day on Wed Sat  . citalopram  40 mg Oral Daily  . dextromethorphan-guaiFENesin  1 tablet Oral BID  . doxazosin  8 mg Oral QHS  . enoxaparin (LOVENOX) injection  40 mg Subcutaneous Q24H  . ipratropium-albuterol  3 mL Nebulization QID  . losartan  100 mg Oral Daily  . nicotine  21 mg Transdermal Daily  . predniSONE  50 mg Oral Q breakfast   Continuous Infusions:      Time spent: [place], [place]  [facility] Pager [phone]. If 7PM-7AM, please contact night-coverage at [email or web address], password [facility] 12/27/2014, 5:12 PM  LOS: 1 day

## 2014-12-27 NOTE — Progress Notes (Signed)
Orthopedic Tech Progress Note Patient Details:  [name] [birth year] [id number]  Ortho Devices Type of Ortho Device: Arm sling Ortho Device/Splint Location: lue Ortho Device/Splint Interventions: Application   Lazette Estala 12/27/2014, 8:06 AM

## 2014-12-27 NOTE — Progress Notes (Signed)
RT Note:  Pt has refused cpap at this time.  RT will continue tom monitor.

## 2014-12-28 DIAGNOSIS — R278 Other lack of coordination: Secondary | ICD-10-CM | POA: Diagnosis not present

## 2014-12-28 DIAGNOSIS — S52532D Colles' fracture of left radius, subsequent encounter for closed fracture with routine healing: Secondary | ICD-10-CM | POA: Diagnosis not present

## 2014-12-28 DIAGNOSIS — J441 Chronic obstructive pulmonary disease with (acute) exacerbation: Secondary | ICD-10-CM | POA: Diagnosis not present

## 2014-12-28 DIAGNOSIS — E876 Hypokalemia: Secondary | ICD-10-CM | POA: Diagnosis not present

## 2014-12-28 DIAGNOSIS — J9601 Acute respiratory failure with hypoxia: Secondary | ICD-10-CM

## 2014-12-28 DIAGNOSIS — J45909 Unspecified asthma, uncomplicated: Secondary | ICD-10-CM | POA: Diagnosis not present

## 2014-12-28 DIAGNOSIS — J449 Chronic obstructive pulmonary disease, unspecified: Secondary | ICD-10-CM | POA: Diagnosis not present

## 2014-12-28 DIAGNOSIS — Z720 Tobacco use: Secondary | ICD-10-CM | POA: Diagnosis not present

## 2014-12-28 DIAGNOSIS — S52502A Unspecified fracture of the lower end of left radius, initial encounter for closed fracture: Secondary | ICD-10-CM | POA: Diagnosis not present

## 2014-12-28 DIAGNOSIS — M21252 Flexion deformity, left hip: Secondary | ICD-10-CM | POA: Diagnosis not present

## 2014-12-28 DIAGNOSIS — I10 Essential (primary) hypertension: Secondary | ICD-10-CM | POA: Diagnosis not present

## 2014-12-28 DIAGNOSIS — E782 Mixed hyperlipidemia: Secondary | ICD-10-CM | POA: Diagnosis not present

## 2014-12-28 DIAGNOSIS — S32028D Other fracture of second lumbar vertebra, subsequent encounter for fracture with routine healing: Secondary | ICD-10-CM | POA: Diagnosis not present

## 2014-12-28 DIAGNOSIS — F329 Major depressive disorder, single episode, unspecified: Secondary | ICD-10-CM | POA: Diagnosis not present

## 2014-12-28 DIAGNOSIS — D72829 Elevated white blood cell count, unspecified: Secondary | ICD-10-CM | POA: Diagnosis not present

## 2014-12-28 DIAGNOSIS — M6281 Muscle weakness (generalized): Secondary | ICD-10-CM | POA: Diagnosis not present

## 2014-12-28 DIAGNOSIS — Z9181 History of falling: Secondary | ICD-10-CM | POA: Diagnosis not present

## 2014-12-28 DIAGNOSIS — S72002S Fracture of unspecified part of neck of left femur, sequela: Secondary | ICD-10-CM | POA: Diagnosis not present

## 2014-12-28 DIAGNOSIS — S72112D Displaced fracture of greater trochanter of left femur, subsequent encounter for closed fracture with routine healing: Secondary | ICD-10-CM | POA: Diagnosis not present

## 2014-12-28 DIAGNOSIS — S52502D Unspecified fracture of the lower end of left radius, subsequent encounter for closed fracture with routine healing: Secondary | ICD-10-CM | POA: Diagnosis not present

## 2014-12-28 DIAGNOSIS — S72002A Fracture of unspecified part of neck of left femur, initial encounter for closed fracture: Secondary | ICD-10-CM | POA: Diagnosis not present

## 2014-12-28 LAB — GLUCOSE, CAPILLARY
Glucose-Capillary: 103 mg/dL — ABNORMAL HIGH (ref 70–99)
Glucose-Capillary: 353 mg/dL — ABNORMAL HIGH (ref 70–99)

## 2014-12-28 MED ORDER — ENOXAPARIN SODIUM 40 MG/0.4ML ~~LOC~~ SOLN: 40.0000 mg | SUBCUTANEOUS | Status: DC

## 2014-12-28 MED ORDER — DM-GUAIFENESIN ER 30-600 MG PO TB12: 1.0000 | ORAL_TABLET | Freq: Two times a day (BID) | ORAL | Status: DC

## 2014-12-28 MED ORDER — NICOTINE 21 MG/24HR TD PT24: 21.0000 mg | MEDICATED_PATCH | Freq: Every day | TRANSDERMAL | Status: DC

## 2014-12-28 MED ORDER — PREDNISONE 5 MG PO TABS: 5.0000 mg | ORAL_TABLET | Freq: Two times a day (BID) | ORAL | Status: DC

## 2014-12-28 MED ORDER — OXYCODONE-ACETAMINOPHEN 5-325 MG PO TABS: 2.0000 | ORAL_TABLET | ORAL | Status: DC | PRN

## 2014-12-28 MED ORDER — PREDNISONE 50 MG PO TABS: ORAL_TABLET | ORAL | Status: AC

## 2014-12-28 NOTE — Progress Notes (Signed)
Called report to [place] [facility]. Gave report to receiving RN. Pt transported by [facility]

## 2014-12-28 NOTE — Clinical Social Work Note (Signed)
Clinical Social Work Assessment  Patient Details  Name: Hailey Keith MRN: [id number] Date of Birth: 1935-02-19  Date of referral:  12/28/14               Reason for consult:  Facility Placement, Discharge Planning                Permission sought to share information with:  Family Supports Permission granted to share information::  Yes, Verbal Permission Granted  Name::     Willia Craze  Agency::     Relationship::  Daughter, Adult Metallurgist Information:  [phone] (Daughter cell phone)  Housing/Transportation Living arrangements for the past 2 months:  [place] of Information:  Patient, Adult Children Patient Interpreter Needed:  None Criminal Activity/Legal Involvement Pertinent to Current Situation/Hospitalization:  No - Comment as needed (Not appropriate on this admission.) Significant Relationships:  Adult Children Lives with:  Adult Children Do you feel safe going back to the place where you live?  Yes Need for family participation in patient care:  Yes (Comment) (Patient's daughter active in patient's care.)  Care giving concerns:  Patient's daughter expressed concern with patient's safety if discharged home from [facility]. Patient's daughter prefers SNF placement at time of discharge as patient and patient's daughter could benefit from short-term rehabilitation to assist patient with safety and mobilization at home.   Social Worker assessment / plan:  Patient recommended for SNF by PT. Patient and patient's daughter agreeable to short-term rehabilitation at [facility]. Patient's daughter active with patient's care and is hopeful for patient's quick return home once completing short-term rehabilitation.  Employment status:  Retired Nurse, adult PT Recommendations:  [place] / Referral to community resources:  [place]  Patient/Family's Response to care:  Patient and  patient's daughter understanding and agreeable to CSW plan of care.  Patient/Family's Understanding of and Emotional Response to Diagnosis, Current Treatment, and Prognosis:  Patient and patient's daughter understanding and agreeable to CSW plan of care.  Emotional Assessment Appearance:  Appears older than stated age Attitude/Demeanor/Rapport:  Other (Patient presents as pleasant during assessment.) Affect (typically observed):  Accepting, Calm, Pleasant, Quiet Orientation:  Oriented to Self, Oriented to Place, Oriented to  Time, Oriented to Situation Alcohol / Substance use:  Not Applicable Psych involvement (Current and /or in the community):  No (Comment) (Not appropriate on this admission.)  Discharge Needs  Concerns to be addressed:  No discharge needs identified Readmission within the last 30 days:  No Current discharge risk:  None Barriers to Discharge:  No Barriers Identified   Caroline Sauger, LCSW 12/28/2014, 12:23 PM  [phone]

## 2014-12-28 NOTE — Discharge Summary (Signed)
Physician Discharge Summary  Hailey Keith TDV:[id number] DOB: [birth year] DOA: 12/25/2014  PCP: Alesia Richards, MD  Admit date: 12/25/2014 Discharge date: 12/28/2014  Time spent: 35 minutes  Recommendations for Outpatient Follow-up:  1. Discharge to skilled nursing facility outpatient follow-up with orthopedics Dr. Percell Miller in 1 week and hand surgery Dr. Amedeo Plenty in 1-2 weeks. Apply sling to the left arm and keep it elevated. 2. Patient will receive DVT prophylaxis with subcutaneous Lovenox for 2 weeks and further on decided by orthopedics. 3. Resume home dose oral prednisone after 5 days of increased  Dose ( 50 mg xday)  received for acute exacerbation.   Discharge Diagnoses:  Principal Problem:   Hip fracture, left  Active Problems:   Colles' fracture of left radius   Mixed hyperlipidemia   Acute hypoxic respiratory failure secondary to COPD exacerbation Hypokalemia   L2 compression fracture   TOBACCO ABUSE   Depression   Essential hypertension   Bronchogenic carcinoma squamous cell type   Vitamin D deficiency   Fall     Discharge Condition: fair  Diet recommendation: Heart healthy  Filed Weights   12/25/14 2126 12/26/14 0350  Weight: 63.504 kg (140 lb) 63.5 kg (139 lb 15.9 oz)    History of present illness:  Please refer to admission H&P for details, in brief,80 year old female with history of hypertension, hyperlipidemia, depression, COPD with history of lung cancer (left upper lobe squamous cell, status post radiation therapy), ongoing tobacco use presented with fall at home with left colles wrist fracture and left greater trochanter fracture with slight displacement. Patient also found to have age indeterminate L2 compression fracture. Patient seen by hand surgery and had closed reduction of the left colles fracture. Given her comorbidities recommended nonoperative management. Patient also seen by orthopedics Dr. Percell Miller and recommended nonoperative management  of her left hip fracture.  Hospital Course:  Avulsion fracture of the left greater trochanter - nonoperative treatment per ortho given her comorbidity.  Touchdown weightbearing with platform walker. - physical therapy recommends skilled nursing facility. Follow-up with Dr. Percell Miller in 1 week with repeat x ray. -Pain control with when necessary Percocet.  Left colles fracture Closed reduction done by hand surgeon upon admission as patient thought to be a poor surgical candidate given her significant comorbidities. Continue pain control, hand elevation with sling placement. Follow with Dr Amedeo Plenty as outpt in 1-2 weeks.  COPD with acute exacerbation Placed on scheduled nebs given active wheezing. On oral prednisone at home and switched to 50 mg daily. After 5 days she will resume her home dose prednisone. Supportive care with antitussives. symptoms have improved. Counseled strongly on smoking cessation. nicotine patch.  Hyperkalemia treated and Resolved.  L2 compression fracture Acute vs chronic. Seen on imaging. Asymptmoiatic. continue PT.   Essential hypertension Continue Cozaar  Hyperlipidemia Continue Lipitor      Code Status: Full code  Family Communication: Daughter at bedside  Disposition Plan: Skilled nursing facility    Consultants:  Dr. Percell Miller (orthopedics)  Dr. Amedeo Plenty ( hand surgery)  Procedures:  Closed reduction of left radial fracture  Antibiotics:  NONE  Discharge Exam: Filed Vitals:   12/28/14 1338  BP: 120/64  Pulse: 80  Temp: 98.5 F (36.9 C)  Resp: 18     General: Elderly female in no acute distress,  HEENT: No pallor, moist oral mucosa, supple neck  Chest: clear to auscultation today, no wheezing rhonchi or crackles  CVS: Normal S1 and S2, no murmurs or gallop  GI: Soft, nondistended, nontender,  bowel sounds present  Musculoskeletal: Dressing over left forearm with hand edema. ble to move fingers.  limited range of motion of  left hip  CNS: Alert and oriented  Discharge Instructions    Current Discharge Medication List    START taking these medications   Details  dextromethorphan-guaiFENesin (MUCINEX DM) 30-600 MG per 12 hr tablet Take 1 tablet by mouth 2 (two) times daily. Qty: 10 tablet, Refills: 0    enoxaparin (LOVENOX) 40 MG/0.4ML injection Inject 0.4 mLs (40 mg total) into the skin daily. Qty: 14 mL, Refills: 0    nicotine (NICODERM CQ - DOSED IN MG/24 HOURS) 21 mg/24hr patch Place 1 patch (21 mg total) onto the skin daily. Qty: 28 patch, Refills: 0    oxyCODONE-acetaminophen (PERCOCET/ROXICET) 5-325 MG per tablet Take 2 tablets by mouth every 4 (four) hours as needed for moderate pain or severe pain. Qty: 30 tablet, Refills: 0      CONTINUE these medications which have CHANGED   Details   predniSONE (DELTASONE) 5 MG tablet Take 1 tablet (5 mg total) by mouth 2 (two) times daily with a meal. Qty: 100 tablet, Refills: 5 ( start from 01/03/2015)   Associated Diagnoses: Primary osteoarthritis involving multiple joints     predniSONE (DELTASONE) 50 MG tablet 1 tablet daily for 5 days Qty: 5 tablet, Refills: 0 ( until 01/02/2015)        CONTINUE these medications which have NOT CHANGED   Details  albuterol (PROVENTIL HFA;VENTOLIN HFA) 108 (90 BASE) MCG/ACT inhaler 1 to 2 inhalations every 4 hours as needed for Rescue Asthma Qty: 1 Inhaler, Refills: 99    aspirin 81 MG tablet Take 81 mg by mouth daily.    atorvastatin (LIPITOR) 80 MG tablet Take 80 mg by mouth 2 (two) times a week. WED AND SAT    citalopram (CELEXA) 40 MG tablet TAKE 1 TABLET BY MOUTH DAILY FOR MOOD Qty: 90 tablet, Refills: 2    doxazosin (CARDURA) 8 MG tablet TAKE 1 TABLET AT BEDTIME Qty: 90 tablet, Refills: 0    ipratropium-albuterol (DUONEB) 0.5-2.5 (3) MG/3ML SOLN Take 3 mLs by nebulization 4 (four) times daily. Qty: 360 mL, Refills: 99    losartan (COZAAR) 100 MG tablet TAKE 1 TABLET BY MOUTH DAILY AS NEEDED FOR  BLOOD PRESSURE Qty: 90 tablet, Refills: 99      STOP taking these medications     temazepam (RESTORIL) 15 MG capsule        Allergies  Allergen Reactions  . Paxil [Paroxetine Hcl] Other (See Comments)    UNKNOWN   Follow-up Information    Please follow up.   Why:  MD at SNF      Follow up with Paulene Floor, MD. Call in 1 week.   Specialty:  Orthopedic Surgery   Contact information:   [address] [place] [address] [phone]       Follow up with Renette Butters, MD. Call in 1 week.   Specialty:  Orthopedic Surgery   Contact information:   [address] [place] [address] [phone]        The results of significant diagnostics from this hospitalization (including imaging, microbiology, ancillary and laboratory) are listed below for reference.    Significant Diagnostic Studies: Dg Chest 2 View  12/27/2014   CLINICAL DATA:  Cough today. History of lung cancer with radiation therapy. Initial encounter.  EXAM: CHEST  2 VIEW  COMPARISON:  Radiographs 05/20/2013.  CT  11/23/2014.  FINDINGS: The heart size and mediastinal contours are stable. There is stable scarring in the left upper lobe and in the right middle lobe. No superimposed airspace disease, edema or significant pleural effusion identified. The bones appear unchanged.  IMPRESSION: Radiographically stable appearance of the chest with sequela of prior radiation therapy. No evidence of superimposed pneumonia.   Electronically Signed   By: Richardean Sale M.D.   On: 12/27/2014 09:15   Dg Wrist Complete Left  12/25/2014   CLINICAL DATA:  Patient fell on to the wrist.  Pain.  EXAM: LEFT WRIST - COMPLETE 3+ VIEW  COMPARISON:  None.  FINDINGS: There is a comminuted and impacted distal radius fracture with dorsal angulation. Slight avulsion ulnar styloid process. Osseous demineralization. Soft tissue swelling.  IMPRESSION: Collies fracture distal radius.  Ulnar styloid  avulsion.   Electronically Signed   By: Rolla Flatten M.D.   On: 12/25/2014 22:52   Ct Lumbar Spine Wo Contrast  12/26/2014   CLINICAL DATA:  [place], pain.  EXAM: CT LUMBAR SPINE WITHOUT CONTRAST  TECHNIQUE: Multidetector CT imaging of the lumbar spine was performed without intravenous contrast administration. Multiplanar CT image reconstructions were also generated.  COMPARISON:  None.  FINDINGS: Dizzy moderate compression deformity at L2, inferior endplate elevation and anterior wedging. Slight retropulsion of bone measuring 5 mm. Loss of 50% vertebral body height anteriorly. This could represent an acute compression fracture. Severe osseous demineralization. Vascular calcification. Mild stenosis at the L2-L3 level related primarily to retropulsion but also mild facet arthropathy. Mild stenosis also present L4-5 related annular bulging and posterior element hypertrophy. Moderately large stool burden.  IMPRESSION: L2 compression fracture of indeterminate age. Correlate clinically for acute compression fracture related to recent fall.  Moderate stenosis at L2-3 related to 5 mm retropulsion from L2.  Mild stenosis also present L4-5.   Electronically Signed   By: Rolla Flatten M.D.   On: 12/26/2014 01:26   Ct Hip Left Wo Contrast  12/26/2014   CLINICAL DATA:  [place].  Pain.  EXAM: CT OF THE LEFT HIP WITHOUT CONTRAST  TECHNIQUE: Multidetector CT imaging of the left hip was performed according to the standard protocol. Multiplanar CT image reconstructions were also generated.  COMPARISON:  Plain films earlier today.  FINDINGS: There is an avulsion fracture of the superior aspect of the greater trochanter with slight displacement as suspected from plain films. No transverse fracture across the femoral neck or intertrochanteric region. The acetabulum and visualized pelvic bones are intact. Slight surrounding hematoma.  IMPRESSION: Avulsion fracture of the superior aspect of the greater trochanter with slight displacement.  No transverse femoral neck or intertrochanteric fracture.   Electronically Signed   By: Rolla Flatten M.D.   On: 12/26/2014 00:36   Dg Hip Unilat With Pelvis 2-3 Views Left  12/25/2014   CLINICAL DATA:  [place], pain.  EXAM: LEFT HIP (WITH PELVIS) 2-3 VIEWS  COMPARISON:  None.  FINDINGS: There is no evidence of subcapital or intertrochanteric hip fracture or dislocation. On the AP hip view, there is slight irregularity at the superior margin of greater trochanter, and a nondisplaced greater trochanteric fracture is difficult to exclude ([place]). Acetabulum and pelvic bones appear intact. Moderate stool burden. Osseous demineralization.  IMPRESSION: Difficult to exclude a nondisplaced fracture of the greater trochanter. No transverse subcapital or intertrochanteric hip fracture is evident.   Electronically Signed   By: Rolla Flatten M.D.   On: 12/25/2014 22:55    Microbiology: No results found for this or  any previous visit (from the past 240 hour(s)).   Labs: Basic Metabolic Panel:  Recent Labs Lab 12/25/14 2325 12/26/14 0517 12/26/14 1238  NA 138 136  --   K 4.6 6.4* 3.9  CL 100 103  --   CO2  --  25  --   GLUCOSE 107* 102*  --   BUN 18 17  --   CREATININE 0.80 0.84  --   CALCIUM  --  8.5  --    Liver Function Tests:  Recent Labs Lab 12/26/14 0517  AST 43*  ALT 19  ALKPHOS 60  BILITOT 1.4*  PROT 5.0*  ALBUMIN 3.1*   No results for input(s): LIPASE, AMYLASE in the last 168 hours. No results for input(s): AMMONIA in the last 168 hours. CBC:  Recent Labs Lab 12/25/14 2325 12/26/14 0517  WBC  --  13.3*  HGB 14.6 12.2  HCT 43.0 37.9  MCV  --  95.0  PLT  --  197   Cardiac Enzymes: No results for input(s): CKTOTAL, CKMB, CKMBINDEX, TROPONINI in the last 168 hours. BNP: BNP (last 3 results) No results for input(s): BNP in the last 8760 hours.  ProBNP (last 3 results) No results for input(s): PROBNP in the last 8760 hours.  CBG:  Recent Labs Lab 12/26/14 1021  12/26/14 1622 12/27/14 0635 12/28/14 0645 12/28/14 0651  GLUCAP 107* 165* 99 353* 103*       Signed:  Dragon Thrush  [facility] 12/28/2014, 2:24 PM

## 2014-12-28 NOTE — Discharge Instructions (Signed)

## 2014-12-28 NOTE — Clinical Social Work Placement (Signed)
   CLINICAL SOCIAL WORK PLACEMENT  NOTE  Date:  12/28/2014  Patient Details  Name: Hailey Keith MRN: [id number] Date of Birth: [birth year]  Clinical Social Work is seeking post-discharge placement for this patient at the [place] level of care (*CSW will initial, date and re-position this form in  chart as items are completed):  Yes   Patient/family provided with [place] Work Department's list of facilities offering this level of care within the geographic area requested by the patient (or if unable, by the patient's family).  Yes   Patient/family informed of their freedom to choose among providers that offer the needed level of care, that participate in Medicare, Medicaid or managed care program needed by the patient, have an available bed and are willing to accept the patient.  Yes   Patient/family informed of [facility] ownership interest in [facility] and [facility], as well as of the fact that they are under no obligation to receive care at these facilities.  PASRR submitted to EDS on 12/28/14     PASRR number received on 12/28/14     Existing PASRR number confirmed on       FL2 transmitted to all facilities in geographic area requested by pt/family on 12/28/14     FL2 transmitted to all facilities within larger geographic area on       Patient informed that his/her managed care company has contracts with or will negotiate with certain facilities, including the following:        Yes   Patient/family informed of bed offers received.  Patient chooses bed at [facility]     Physician recommends and patient chooses bed at      Patient to be transferred to [facility] on 12/28/14.  Patient to be transferred to facility by [facility]     Patient family notified on 12/28/14 of transfer.  Name of family member notified:  Tamsen Meek     PHYSICIAN   Additional Comment:     _______________________________________________ Caroline Sauger, LCSW 12/28/2014, 3:25 PM [phone]

## 2014-12-28 NOTE — Discharge Planning (Signed)
Patient to be discharged to [facility]. Patient's daughter updated at bedside.  Facility: [facility] Report number: [phone] Transportation: EMS ([facility])  Lubertha Sayres, [place] Cell: [phone]       Fax: [phone] Clinical Social Work: Orthopedics [phone]) and Surgical [phone])

## 2014-12-28 NOTE — Progress Notes (Signed)
PT Cancellation Note  Patient Details Name: Hailey Keith MRN: [id number] DOB: 1935-02-21   Cancelled Treatment:    Reason Eval/Treat Not Completed: Other (comment) . Patient leaving for SNF this afternoon and patient and family requested to hold due to transfer and having to get dressed.   Jacqualyn Posey 12/28/2014, 1:14 PM

## 2014-12-29 ENCOUNTER — Encounter: Payer: Self-pay | Admitting: Registered Nurse

## 2014-12-29 ENCOUNTER — Non-Acute Institutional Stay (SKILLED_NURSING_FACILITY): Payer: Medicare Other | Admitting: Registered Nurse

## 2014-12-29 DIAGNOSIS — F32A Depression, unspecified: Secondary | ICD-10-CM

## 2014-12-29 DIAGNOSIS — Z720 Tobacco use: Secondary | ICD-10-CM

## 2014-12-29 DIAGNOSIS — I10 Essential (primary) hypertension: Secondary | ICD-10-CM | POA: Diagnosis not present

## 2014-12-29 DIAGNOSIS — G4700 Insomnia, unspecified: Secondary | ICD-10-CM | POA: Diagnosis not present

## 2014-12-29 DIAGNOSIS — F329 Major depressive disorder, single episode, unspecified: Secondary | ICD-10-CM

## 2014-12-29 DIAGNOSIS — S32020S Wedge compression fracture of second lumbar vertebra, sequela: Secondary | ICD-10-CM | POA: Diagnosis not present

## 2014-12-29 DIAGNOSIS — E782 Mixed hyperlipidemia: Secondary | ICD-10-CM

## 2014-12-29 DIAGNOSIS — J441 Chronic obstructive pulmonary disease with (acute) exacerbation: Secondary | ICD-10-CM | POA: Diagnosis not present

## 2014-12-29 DIAGNOSIS — S52532S Colles' fracture of left radius, sequela: Secondary | ICD-10-CM

## 2014-12-29 DIAGNOSIS — F17200 Nicotine dependence, unspecified, uncomplicated: Secondary | ICD-10-CM

## 2014-12-29 DIAGNOSIS — S72002S Fracture of unspecified part of neck of left femur, sequela: Secondary | ICD-10-CM

## 2014-12-29 NOTE — Progress Notes (Signed)
Patient ID: Hailey Keith, female   DOB: 11/29/1934, 80 y.o.   MRN: [id number]   Place of Service: [facility]  Allergies  Allergen Reactions  . Paxil [Paroxetine Hcl] Other (See Comments)    UNKNOWN    Code Status: Full Code  Goals of Care: Longevity/STR  Chief Complaint  Patient presents with  . Hospitalization Follow-up    HPI Review of hospital record showed 80 y.o. female with PMH of HTN, HLD, depression, COPD with history of left upper lobe SCC s/p radiation therapy, oxygen dependent, ongoing tobacco abuse among others is being seen for a follow-up visit post hospital admission from 12/25/14 to 12/28/14 with left Colles wrist fracture and left greater trochanter fracture with slight displacement s/p fall at home. She also found to have L2 compression fracture. She is s/p closed reduction of left Colles fracture. Conservative/nonoperative management recommended given her comorbidities. She is now here for short term rehab and her goal is to return home. Seen in room today. Reported having intermittent, localized sharp left hip pain 7/10. Movement makes pain worse. Has not had any pain med since last night-would like some pain med. Denies any other concerns. Family at bedside-concerns about swelling of her left hand.   Review of Systems Constitutional: Negative for fever, chills, and fatigue. HENT: Negative for ear pain, congestion, and sore throat Eyes: Negative for eye pain, eye discharge, and visual disturbance  Cardiovascular: Negative for chest pain, palpitations, and leg swelling Respiratory: Negative cough. Positive for shortness of breath and wheezing, but no more than usual  Gastrointestinal: Negative for nausea and vomiting. Negative for abdominal pain, diarrhea and constipation.  Genitourinary: Negative for dysuria Endocrine: Negative for polydipsia, polyphagia, and polyuria Musculoskeletal: See HPI Neurological: Negative for dizziness and headache.  Skin:  Negative for rash and itchiness.   Psychiatric: Negative for depression  Past Medical History  Diagnosis Date  . COPD (chronic obstructive pulmonary disease)   . Hypertension   . Dyslipidemia   . Hard of hearing   . Hx of radiation therapy 06/18/13- 07/23/13    LUL lung primary, 7020 cGy 26 sessions  . 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 Skin cancer     basal cell  . Type II or unspecified type diabetes mellitus without mention of complication, not stated as uncontrolled     Past Surgical History  Procedure Laterality Date  . Tonsillectomy      as child, repeat surgery early 20's  . Mohs surgery      "several times"  . Facial reconstruction surgery  25 years ago    to remove skin cancer, right side of face    History  Substance Use Topics  . Smoking status: Current Every Day Smoker -- 0.50 packs/day for 60 years    Types: Cigarettes  . Smokeless tobacco: Not on file     Comment: 06/05/13 currently 1/2 PPD, trying to quit  . Alcohol Use: Yes     Comment: occ wine, 2 drinks nightly    Family History  Problem Relation Age of Onset  . Hypertension    . Lung cancer Sister     tx w/radiation, met to stomach  . Pneumonia Mother     Deceased  . Heart attack Father     Deceased  . Cancer Sister     "back of neck"      Medication List  This list is accurate as of: 12/29/14  9:47 PM.  Always use your most recent med list.               albuterol 108 (90 BASE) MCG/ACT inhaler  Commonly known as:  PROVENTIL HFA;VENTOLIN HFA  1 to 2 inhalations every 4 hours as needed for Rescue Asthma     aspirin 81 MG tablet  Take 81 mg by mouth daily.     atorvastatin 80 MG tablet  Commonly known as:  LIPITOR  Take 80 mg by mouth 2 (two) times a week. WED AND SAT     citalopram 40 MG tablet  Commonly known as:  CELEXA  TAKE 1 TABLET BY MOUTH DAILY FOR MOOD     dextromethorphan-guaiFENesin 30-600 MG per 12 hr  tablet  Commonly known as:  MUCINEX DM  Take 1 tablet by mouth 2 (two) times daily.     doxazosin 8 MG tablet  Commonly known as:  CARDURA  TAKE 1 TABLET AT BEDTIME     enoxaparin 40 MG/0.4ML injection  Commonly known as:  LOVENOX  Inject 0.4 mLs (40 mg total) into the skin daily.     ipratropium-albuterol 0.5-2.5 (3) MG/3ML Soln  Commonly known as:  DUONEB  Take 3 mLs by nebulization 4 (four) times daily.     losartan 100 MG tablet  Commonly known as:  COZAAR  Take 100 mg by mouth daily.     Melatonin 5 MG Caps  Take 5 mg by mouth at bedtime.     nicotine 21 mg/24hr patch  Commonly known as:  NICODERM CQ - dosed in mg/24 hours  Place 1 patch (21 mg total) onto the skin daily.     oxyCODONE-acetaminophen 5-325 MG per tablet  Commonly known as:  PERCOCET/ROXICET  Take 2 tablets by mouth every 4 (four) hours as needed for moderate pain or severe pain.     predniSONE 50 MG tablet  Commonly known as:  DELTASONE  1 tablet daily for 5 days     predniSONE 5 MG tablet  Commonly known as:  DELTASONE  Take 1 tablet (5 mg total) by mouth 2 (two) times daily with a meal.  Start taking on:  01/03/2015        Physical Exam  BP 144/72 mmHg  Pulse 92  Temp(Src) 98.5 F (36.9 C)  Resp 16  Ht 5' 4" (1.626 m)  SpO2 95%  Constitutional: Frail elderly female in no acute distress. Conversant and pleasant HEENT: Normocephalic and atraumatic. PERRL. EOM intact. No icterus. Oral mucosa moist. Posterior pharynx clear of any exudate or lesions.  Neck: Supple and nontender. No lymphadenopathy, masses, or thyromegaly. No JVD or carotid bruits. Cardiac: Normal S1, S2. RRR without appreciable murmurs, rubs, or gallops. Distal pulses intact. Trace dependent edema.  Respiratory: Unlabored respiration. Breath sounds diminished with wheezes bilaterally. Oxygen via [place] at 3lpm in place GI: Audible bowel sounds in all quadrants. Soft, nontender, nondistended.  Musculoskeletal: LUE with splint/ace  wrap. Moderate edema noted. LUE with brisk cap refill. .  Skin: Warm and dry.  Neurological: Alert and oriented to person, place, and time. No focal deficits.  Psychiatric: Appropriate mood and affect.   Labs Reviewed  CBC Latest Ref Rng 12/26/2014 12/25/2014 11/23/2014  WBC 4.0 - 10.5 K/uL 13.3(H) - 11.8(H)  Hemoglobin 12.0 - 15.0 g/dL 12.2 14.6 14.3  Hematocrit 36.0 - 46.0 % 37.9 43.0 44.2  Platelets 150 - 400 K/uL 197 - 210    CMP Latest  Ref Rng 12/26/2014 12/26/2014 12/25/2014  Glucose 70 - 99 mg/dL - 102(H) 107(H)  BUN 6 - 23 mg/dL - 17 18  Creatinine 0.50 - 1.10 mg/dL - 0.84 0.80  Sodium 135 - 145 mmol/L - 136 138  Potassium 3.5 - 5.1 mmol/L 3.9 6.4(HH) 4.6  Chloride 96 - 112 mmol/L - 103 100  CO2 19 - 32 mmol/L - 25 -  Calcium 8.4 - 10.5 mg/dL - 8.5 -  Total Protein 6.0 - 8.3 g/dL - 5.0(L) -  Total Bilirubin 0.3 - 1.2 mg/dL - 1.4(H) -  Alkaline Phos 39 - 117 U/L - 60 -  AST 0 - 37 U/L - 43(H) -  ALT 0 - 35 U/L - 19 -    Lab Results  Component Value Date   HGBA1C 6.1* 11/02/2014    Lab Results  Component Value Date   TSH 1.905 11/02/2014    Diagnostic Studies Reviewed 12/25/14: CT left hip:  Avulsion fracture of the superior aspect of the greater trochanter with slight displacement. No transverse femoral neck or intertrochanteric fracture.  12/25/14: CT lumbar spine:  L2 compression fracture of indeterminate age. Correlate clinically for acute compression fracture related to recent fall. Moderate stenosis at L2-3 related to 5 mm retropulsion from L2. Mild stenosis also present L4-5.  12/25/14: Wrist xray: Collies fracture distal radius. Ulnar styloid avulsion.  12/26/14: EKG: NSR  12/27/14: CXR: Radiographically stable appearance of the chest with sequela of prior radiation therapy. No evidence of superimposed pneumonia  Assessment & Plan 1. Essential hypertension Continue cardura 54m daily and cozar losartan 1041mdaily. Monitor bp  2. COPD exacerbation Oxygen  dependent via [place] at 3lpm. Continue duoneb every four hours for wheezing/shortness of breath with prednisone 506maily x 5 days then 5mg10mice daily. Continue mucinex dm 600/30 mg twice daily for cough/congestion.   3. TOBACCO ABUSE Continue nicotine 21mg70mr patch daily. Encourage smoking cessation  4. Mixed hyperlipidemia Continue lipitor 80mg 106me a week on wed and sat.   5. Insomnia Start melatonin 5mg da73m at bedtime.   6. Hip fracture, left, sequela Poor surgical candidate. Continue pain control with percocet 5/325mg 2 41m every four hours as needed. Continue touchdown weightbearing with platform walker. Continue to work with PT/OT for gait/strength/balance training to restore/maximize function. Continue lovenox 40mg dai53m 2 weeks for dvt prophylaxis anf /u with Dr. Murphy inPercell Miller repeat xray. Fall risk precautions  7. Colles' fracture of left radius, sequela S/p closed reduction by hand surgeon. Poor surgical candidate. Pain is adequately controlled with current regimen. Continue percocet 5/325mg 2 ta24mvery four hours as needed for pain. Encourage hand elevation to reduce swelling. Continue to f/u with Dr. Gramig in [facility].   8. L2 Compression fracture Acute vs. Chronic. Remains asymptomatic. Continue PT/OT  9. Depression Continue celexa 40mg daily28mnitor for change in mood.    Diagnostic Studies/Labs Ordered: cbc, bmp in 1 week  Time spent: 40 minutes with >50% of total time on care coordination and counseling regarding pain management and smoking cessation.   Family/Staff Communication Plan of care discussed with patient and nursing staff. Patient and nursing staff verbalized understanding and agree with plan of care. No additional questions or concerns reported.     ,Arthur Holms-C [facility] [phone] hours: [phone]

## 2014-12-30 ENCOUNTER — Non-Acute Institutional Stay (SKILLED_NURSING_FACILITY): Payer: Medicare Other | Admitting: Internal Medicine

## 2014-12-30 DIAGNOSIS — S72002S Fracture of unspecified part of neck of left femur, sequela: Secondary | ICD-10-CM | POA: Diagnosis not present

## 2014-12-30 DIAGNOSIS — S52532D Colles' fracture of left radius, subsequent encounter for closed fracture with routine healing: Secondary | ICD-10-CM

## 2014-12-30 DIAGNOSIS — F329 Major depressive disorder, single episode, unspecified: Secondary | ICD-10-CM

## 2014-12-30 DIAGNOSIS — J441 Chronic obstructive pulmonary disease with (acute) exacerbation: Secondary | ICD-10-CM | POA: Diagnosis not present

## 2014-12-30 DIAGNOSIS — D72829 Elevated white blood cell count, unspecified: Secondary | ICD-10-CM | POA: Diagnosis not present

## 2014-12-30 DIAGNOSIS — I10 Essential (primary) hypertension: Secondary | ICD-10-CM

## 2014-12-30 DIAGNOSIS — E782 Mixed hyperlipidemia: Secondary | ICD-10-CM | POA: Diagnosis not present

## 2014-12-30 DIAGNOSIS — F32A Depression, unspecified: Secondary | ICD-10-CM

## 2014-12-30 DIAGNOSIS — G4700 Insomnia, unspecified: Secondary | ICD-10-CM | POA: Diagnosis not present

## 2014-12-30 DIAGNOSIS — Z720 Tobacco use: Secondary | ICD-10-CM

## 2014-12-30 DIAGNOSIS — F17200 Nicotine dependence, unspecified, uncomplicated: Secondary | ICD-10-CM

## 2014-12-30 NOTE — Progress Notes (Signed)
Patient ID: Hailey Keith, female   DOB: 1935/09/03, 80 y.o.   MRN: [id number]     Facility: [facility]    PCP: Alesia Richards, MD  Code Status: full code  Allergies  Allergen Reactions  . Paxil [Paroxetine Hcl] Other (See Comments)    UNKNOWN    Chief Complaint  Patient presents with  . New Admit To SNF     HPI:  80 year old patient is here for short term rehabilitation post hospital admission from 12/25/14 - 12/28/14 post fall with left wrist fracture and left greater trochanter fracture with slight displacement. She had closed reduction of her left wrist and no surgical intervention to left leg. Her pain is currently under control with current regimen. She had a bowel movement yesterday but has not had complete evacuation. She smokes and is currently on nicotine patch. Also on o2 by nasal canula. Her daughter is at her bedside. She has PMH of HTN, HLD, depression, COPD with history of left upper lobe SCC s/p radiation therapy.  Review of Systems:  Constitutional: Negative for fever, chills, diaphoresis.  HENT: Negative for headache, congestion, nasal discharge Eyes: Negative for eye pain, blurred vision, double vision and discharge.  Respiratory: Negative for cough, shortness of breath and wheezing.   Cardiovascular: Negative for chest pain, palpitations. Positive for left leg swelling.  Gastrointestinal: Negative for heartburn, nausea, vomiting, abdominal pain. Has constipation Genitourinary: Negative for dysuria Musculoskeletal: Negative for falls in facility Skin: Negative for itching, rash.  Psychiatric/Behavioral: Negative for depression   Past Medical History  Diagnosis Date  . COPD (chronic obstructive pulmonary disease)   . Hypertension   . Dyslipidemia   . Hard of hearing   . Hx of radiation therapy 06/18/13- 07/23/13    LUL lung primary, 7020 cGy 26 sessions  . 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 Squamous cell  carcinoma lung 05/20/13    LUL  . Skin cancer     basal cell  . Type II or unspecified type diabetes mellitus without mention of complication, not stated as uncontrolled    Past Surgical History  Procedure Laterality Date  . Tonsillectomy      as child, repeat surgery early 20's  . Mohs surgery      "several times"  . Facial reconstruction surgery  25 years ago    to remove skin cancer, right side of face   Social History:   reports that she has been smoking Cigarettes.  She has a 30 pack-year smoking history. She does not have any smokeless tobacco history on file. She reports that she drinks alcohol. She reports that she does not use illicit drugs.  Family History  Problem Relation Age of Onset  . Hypertension    . Lung cancer Sister     tx w/radiation, met to stomach  . Pneumonia Mother     Deceased  . Heart attack Father     Deceased  . Cancer Sister     "back of neck"    Medications: Patient's Medications  New Prescriptions   No medications on file  Previous Medications   ALBUTEROL (PROVENTIL HFA;VENTOLIN HFA) 108 (90 BASE) MCG/ACT INHALER    1 to 2 inhalations every 4 hours as needed for Rescue Asthma   ASPIRIN 81 MG TABLET    Take 81 mg by mouth daily.   ATORVASTATIN (LIPITOR) 80 MG TABLET    Take 80 mg by mouth 2 (two) times a  week. WED AND SAT   CITALOPRAM (CELEXA) 40 MG TABLET    TAKE 1 TABLET BY MOUTH DAILY FOR MOOD   DEXTROMETHORPHAN-GUAIFENESIN (MUCINEX DM) 30-600 MG PER 12 HR TABLET    Take 1 tablet by mouth 2 (two) times daily.   DOXAZOSIN (CARDURA) 8 MG TABLET    TAKE 1 TABLET AT BEDTIME   ENOXAPARIN (LOVENOX) 40 MG/0.4ML INJECTION    Inject 0.4 mLs (40 mg total) into the skin daily.   IPRATROPIUM-ALBUTEROL (DUONEB) 0.5-2.5 (3) MG/3ML SOLN    Take 3 mLs by nebulization 4 (four) times daily.   LOSARTAN (COZAAR) 100 MG TABLET    Take 100 mg by mouth daily.   MELATONIN 5 MG CAPS    Take 5 mg by mouth at bedtime.   NICOTINE (NICODERM CQ - DOSED IN MG/24 HOURS)  21 MG/24HR PATCH    Place 1 patch (21 mg total) onto the skin daily.   OXYCODONE-ACETAMINOPHEN (PERCOCET/ROXICET) 5-325 MG PER TABLET    Take 2 tablets by mouth every 4 (four) hours as needed for moderate pain or severe pain.   PREDNISONE (DELTASONE) 5 MG TABLET    Take 1 tablet (5 mg total) by mouth 2 (two) times daily with a meal.   PREDNISONE (DELTASONE) 50 MG TABLET    1 tablet daily for 5 days  Modified Medications   No medications on file  Discontinued Medications   No medications on file     Physical Exam: Filed Vitals:   12/30/14 1711  BP: 127/63  Pulse: 72  Temp: 98.1 F (36.7 C)  Resp: 18  SpO2: 98%    General- elderly female, thin built, chronically ill appearing, in no acute distress Head- normocephalic, atraumatic Throat- moist mucus membrane Eyes- PERRLA, EOMI, no pallor, no icterus, no discharge Cardiovascular- normal s1,s2, no murmurs, palpable dorsalis pedis and radial pulses, 1+ left leg edema Respiratory- bilateral clear to auscultation, no wheeze, no rhonchi, no crackles, no use of accessory muscles Abdomen- bowel sounds present, soft, non tender Musculoskeletal- able to move all 4 extremities, left arm in cast, able to move her fingers and good circulation but has trace dependent edema to left hand Neurological- no focal deficit Skin- warm and dry Psychiatry- alert and oriented to person, place and time, normal mood and affect    Labs reviewed: Basic Metabolic Panel:  Recent Labs  03/31/14 1558  11/02/14 1203 11/23/14 1441 12/25/14 2325 12/26/14 0517 12/26/14 1238  NA 139  < > 142 143 138 136  --   K 3.9  < > 4.0 4.8 4.6 6.4* 3.9  CL 98  < > 101  --  100 103  --   CO2 29  < > 32 28  --  25  --   GLUCOSE 82  < > 175* 89 107* 102*  --   BUN 9  < > 12 15.$Remov'4 18 17  'FsMAxQ$ --   CREATININE 0.68  < > 0.76 0.7 0.80 0.84  --   CALCIUM 9.6  < > 10.1 9.7  --  8.5  --   MG 1.8  --  1.8  --   --   --   --   < > = values in this interval not displayed. Liver  Function Tests:  Recent Labs  11/02/14 1203 11/23/14 1441 12/26/14 0517  AST 22 24 43*  ALT $Re'15 18 19  'VEs$ ALKPHOS 76 72 60  BILITOT 0.5 0.48 1.4*  PROT 6.1 6.3* 5.0*  ALBUMIN 4.0 3.6 3.1*  No results for input(s): LIPASE, AMYLASE in the last 8760 hours. No results for input(s): AMMONIA in the last 8760 hours. CBC:  Recent Labs  07/22/14 1155 11/02/14 1203 11/23/14 1441 12/25/14 2325 12/26/14 0517  WBC 8.7 8.1 11.8*  --  13.3*  NEUTROABS 6.7 5.9 8.1*  --   --   HGB 14.0 14.1 14.3 14.6 12.2  HCT 44.1 43.6 44.2 43.0 37.9  MCV 91.5 92.0 92.7  --  95.0  PLT 240 221 210  --  197   Cardiac Enzymes: No results for input(s): CKTOTAL, CKMB, CKMBINDEX, TROPONINI in the last 8760 hours. BNP: Invalid input(s): POCBNP CBG:  Recent Labs  12/27/14 0635 12/28/14 0645 12/28/14 0651  GLUCAP 99 353* 103*    Assessment/Plan  Left hip fracture Conservative management for now. Touchdown weight bearing to left leg. Continue percocet 5/325mg  2 tabs every four hours as needed for pain. Will have patient work with PT/OT as tolerated to regain strength and restore function.  Fall precautions are in place. Continue lovenox 40mg  daily x 2 weeks for dvt prophylaxis. Has f/u with orthopedics  Left radius fracture S/p closed reduction. Continue percocet 5/325mg  2 tabs every four hours as needed for pain. Has f/u with orthopedics.   Copd exacerbation Stable. Complete current course of prednsione 50 mg daily for few more days, then to take chronic prednisone 5 mg bid. Continue o2, prn duoneb for now. Encouraged tobacco cessation  Leukocytosis Recent copd exacerbation and her prednisone could both be contributing to this. Monitor cbc with diff.   HTN Stable on cardura 8mg  daily and losartan 100mg  daily.   Tobacco use Continue nicotine patch for now. counselled on smoking cessation  Depression Stable, continue celexa 40 mg daily  Insomnia Continue melatonin 5 mg daily  HLD Continue  lipitor 80mg  twice a week home regimen   Goals of care: short term rehabilitation   Labs/tests ordered: cbc, bmp  Family/ staff Communication: reviewed care plan with patient and nursing supervisor    Blanchie Serve, MD  [facility] [phone] (Monday-Friday 8 am - 5 pm) [phone] (afterhours)

## 2014-12-31 DIAGNOSIS — S72112D Displaced fracture of greater trochanter of left femur, subsequent encounter for closed fracture with routine healing: Secondary | ICD-10-CM | POA: Diagnosis not present

## 2014-12-31 DIAGNOSIS — S52502A Unspecified fracture of the lower end of left radius, initial encounter for closed fracture: Secondary | ICD-10-CM | POA: Diagnosis not present

## 2015-01-05 LAB — CBC AND DIFFERENTIAL
HCT: 35 % — AB (ref 36–46)
Hemoglobin: 11.2 g/dL — AB (ref 12.0–16.0)
PLATELETS: 263 10*3/uL (ref 150–399)
WBC: 9.8 10^3/mL

## 2015-01-05 LAB — BASIC METABOLIC PANEL
BUN: 16 mg/dL (ref 4–21)
CREATININE: 0.7 mg/dL (ref 0.5–1.1)
GLUCOSE: 83 mg/dL
Potassium: 4.6 mmol/L (ref 3.4–5.3)
Sodium: 142 mmol/L (ref 137–147)

## 2015-01-10 ENCOUNTER — Other Ambulatory Visit: Payer: Self-pay | Admitting: *Deleted

## 2015-01-10 ENCOUNTER — Other Ambulatory Visit: Payer: Self-pay

## 2015-01-10 MED ORDER — OXYCODONE-ACETAMINOPHEN 5-325 MG PO TABS: ORAL_TABLET | ORAL | Status: DC

## 2015-01-10 NOTE — Telephone Encounter (Signed)
RX Fax to [facility] @ [phone]

## 2015-01-10 NOTE — Telephone Encounter (Signed)
[facility] 

## 2015-01-14 ENCOUNTER — Encounter: Payer: Self-pay | Admitting: Registered Nurse

## 2015-01-14 ENCOUNTER — Non-Acute Institutional Stay (SKILLED_NURSING_FACILITY): Payer: Medicare Other | Admitting: Registered Nurse

## 2015-01-14 DIAGNOSIS — E782 Mixed hyperlipidemia: Secondary | ICD-10-CM

## 2015-01-14 DIAGNOSIS — F32A Depression, unspecified: Secondary | ICD-10-CM

## 2015-01-14 DIAGNOSIS — F17200 Nicotine dependence, unspecified, uncomplicated: Secondary | ICD-10-CM

## 2015-01-14 DIAGNOSIS — D72829 Elevated white blood cell count, unspecified: Secondary | ICD-10-CM | POA: Diagnosis not present

## 2015-01-14 DIAGNOSIS — S72002S Fracture of unspecified part of neck of left femur, sequela: Secondary | ICD-10-CM | POA: Diagnosis not present

## 2015-01-14 DIAGNOSIS — Z720 Tobacco use: Secondary | ICD-10-CM

## 2015-01-14 DIAGNOSIS — F329 Major depressive disorder, single episode, unspecified: Secondary | ICD-10-CM

## 2015-01-14 DIAGNOSIS — S32020S Wedge compression fracture of second lumbar vertebra, sequela: Secondary | ICD-10-CM

## 2015-01-14 DIAGNOSIS — I10 Essential (primary) hypertension: Secondary | ICD-10-CM | POA: Diagnosis not present

## 2015-01-14 DIAGNOSIS — J441 Chronic obstructive pulmonary disease with (acute) exacerbation: Secondary | ICD-10-CM

## 2015-01-14 DIAGNOSIS — S52532S Colles' fracture of left radius, sequela: Secondary | ICD-10-CM

## 2015-01-14 DIAGNOSIS — G4700 Insomnia, unspecified: Secondary | ICD-10-CM

## 2015-01-14 NOTE — Progress Notes (Signed)
Patient ID: Hailey Keith, female   DOB: 06/10/1935, 80 y.o.   MRN: [id number]   Place of Service: [facility]  Allergies  Allergen Reactions  . Paxil [Paroxetine Hcl] Other (See Comments)    UNKNOWN    Code Status: Full Code  Goals of Care: Longevity/STR  Chief Complaint  Patient presents with  . Discharge Note    HPI 80 y.o. female with PMH of HTN, HLD, depression, COPD with history of left upper lobe SCC s/p radiation therapy, oxygen dependent, ongoing tobacco abuse among others is being seen for a discharge visit. She was here for short term rehab post hospital admission from 12/25/14 to 12/28/14 with left Colles wrist fracture and left greater trochanter fracture with slight displacement s/p fall at home. She also found to have L2 compression fracture. She is s/p closed reduction of left Colles fracture. Conservative/nonoperative management recommended given her comorbidities. She has worked well with therapy and is stable to be discharge home with [facility] PT/OT and DME (L-arm platform walker, 3-1). Seen in room today. No concerns reported.   Review of Systems Constitutional: Negative for fever, chills, and fatigue. HENT: Negative for ear pain, congestion, and sore throat Eyes: Negative for eye pain, eye discharge, and visual disturbance  Cardiovascular: Negative for chest pain, palpitations, and leg swelling Respiratory: Negative cough. Positive for shortness of breath and wheezing, but no more than usual  Gastrointestinal: Negative for nausea and vomiting. Negative for abdominal pain, diarrhea and constipation.  Genitourinary: Negative for dysuria Endocrine: Negative for polydipsia, polyphagia, and polyuria Musculoskeletal: Negative for uncontrolled pain Neurological: Negative for dizziness and headache.  Skin: Negative for rash and itchiness.   Psychiatric: Negative for depression  Past Medical History  Diagnosis Date  . COPD (chronic obstructive pulmonary  disease)   . Hypertension   . Dyslipidemia   . Hard of hearing   . Hx of radiation therapy 06/18/13- 07/23/13    LUL lung primary, 7020 cGy 26 sessions  . 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 Skin cancer     basal cell  . Type II or unspecified type diabetes mellitus without mention of complication, not stated as uncontrolled     Past Surgical History  Procedure Laterality Date  . Tonsillectomy      as child, repeat surgery early 20's  . Mohs surgery      "several times"  . Facial reconstruction surgery  25 years ago    to remove skin cancer, right side of face    History  Substance Use Topics  . Smoking status: Current Every Day Smoker -- 0.50 packs/day for 60 years    Types: Cigarettes  . Smokeless tobacco: Not on file     Comment: 06/05/13 currently 1/2 PPD, trying to quit  . Alcohol Use: Yes     Comment: occ wine, 2 drinks nightly    Family History  Problem Relation Age of Onset  . Hypertension    . Lung cancer Sister     tx w/radiation, met to stomach  . Pneumonia Mother     Deceased  . Heart attack Father     Deceased  . Cancer Sister     "back of neck"      Medication List       This list is accurate as of: 01/14/15  5:02 PM.  Always use your most recent med list.  albuterol 108 (90 BASE) MCG/ACT inhaler  Commonly known as:  PROVENTIL HFA;VENTOLIN HFA  1 to 2 inhalations every 4 hours as needed for Rescue Asthma     aspirin 81 MG tablet  Take 81 mg by mouth daily.     atorvastatin 80 MG tablet  Commonly known as:  LIPITOR  Take 80 mg by mouth 2 (two) times a week. WED AND SAT     citalopram 40 MG tablet  Commonly known as:  CELEXA  TAKE 1 TABLET BY MOUTH DAILY FOR MOOD     dextromethorphan-guaiFENesin 30-600 MG per 12 hr tablet  Commonly known as:  MUCINEX DM  Take 1 tablet by mouth 2 (two) times daily.     doxazosin 8 MG tablet  Commonly known as:  CARDURA  TAKE 1  TABLET AT BEDTIME     ipratropium-albuterol 0.5-2.5 (3) MG/3ML Soln  Commonly known as:  DUONEB  Take 3 mLs by nebulization 4 (four) times daily.     losartan 100 MG tablet  Commonly known as:  COZAAR  Take 100 mg by mouth daily.     Melatonin 5 MG Caps  Take 5 mg by mouth at bedtime.     nicotine 21 mg/24hr patch  Commonly known as:  NICODERM CQ - dosed in mg/24 hours  Place 1 patch (21 mg total) onto the skin daily.     oxyCODONE-acetaminophen 5-325 MG per tablet  Commonly known as:  PERCOCET/ROXICET  Take two tablets by mouth every four hours as needed for pain. Do not exceed 4gms of Tylenol in 24 hours     predniSONE 5 MG tablet  Commonly known as:  DELTASONE  Take 1 tablet (5 mg total) by mouth 2 (two) times daily with a meal.        Physical Exam  BP 118/60 mmHg  Pulse 76  Temp(Src) 98.9 F (37.2 C)  Resp 16  Ht 5' 4" (1.626 m)  Wt 142 lb (64.411 kg)  BMI 24.36 kg/m2  SpO2 94%  Constitutional: Frail elderly female in no acute distress. Conversant and pleasant HEENT: Normocephalic and atraumatic. PERRL. EOM intact. No icterus. Oral mucosa moist. Posterior pharynx clear of any exudate or lesions.  Neck: Supple and nontender. No lymphadenopathy, masses, or thyromegaly. No JVD or carotid bruits. Cardiac: Normal S1, S2. RRR without appreciable murmurs, rubs, or gallops. Distal pulses intact. Trace dependent edema.  Respiratory: Unlabored respiration. Breath sounds diminished with wheezes bilaterally. Oxygen via [place] at 3lpm in place GI: Audible bowel sounds in all quadrants. Soft, nontender, nondistended.  Musculoskeletal: LUE cast in place with good cap refill Skin: Warm and dry.  Neurological: Alert and oriented to person, place, and time. No focal deficits.  Psychiatric: Appropriate mood and affect.   Labs Reviewed  CBC Latest Ref Rng 01/05/2015 12/26/2014 12/25/2014  WBC - 9.8 13.3(H) -  Hemoglobin 12.0 - 16.0 g/dL 11.2(A) 12.2 14.6  Hematocrit 36 - 46 % 35(A)  37.9 43.0  Platelets 150 - 399 K/L 263 197 -    CMP Latest Ref Rng 01/05/2015 12/26/2014 12/26/2014  Glucose 70 - 99 mg/dL - - 102(H)  BUN 4 - 21 mg/dL 16 - 17  Creatinine 0.5 - 1.1 mg/dL 0.7 - 0.84  Sodium 137 - 147 mmol/L 142 - 136  Potassium 3.4 - 5.3 mmol/L 4.6 3.9 6.4(HH)  Chloride 96 - 112 mmol/L - - 103  CO2 19 - 32 mmol/L - - 25  Calcium 8.4 - 10.5 mg/dL - - 8.5  Total  Protein 6.0 - 8.3 g/dL - - 5.0(L)  Total Bilirubin 0.3 - 1.2 mg/dL - - 1.4(H)  Alkaline Phos 39 - 117 U/L - - 60  AST 0 - 37 U/L - - 43(H)  ALT 0 - 35 U/L - - 19    Lab Results  Component Value Date   HGBA1C 6.1* 11/02/2014    Lab Results  Component Value Date   TSH 1.905 11/02/2014    Diagnostic Studies Reviewed 12/25/14: CT left hip:  Avulsion fracture of the superior aspect of the greater trochanter with slight displacement. No transverse femoral neck or intertrochanteric fracture.  12/25/14: CT lumbar spine:  L2 compression fracture of indeterminate age. Correlate clinically for acute compression fracture related to recent fall. Moderate stenosis at L2-3 related to 5 mm retropulsion from L2. Mild stenosis also present L4-5.  12/25/14: Wrist xray: Collies fracture distal radius. Ulnar styloid avulsion.  12/26/14: EKG: NSR  12/27/14: CXR: Radiographically stable appearance of the chest with sequela of prior radiation therapy. No evidence of superimposed pneumonia  Assessment & Plan 1. Essential hypertension Stable. Continue cardura 20m daily and cozar losartan 10540mdaily. F/u with pcp  2. COPD exacerbation Resolved. Denies any respiratory distress. Oxygen dependent via [place] at 3lpm. Continue duoneb every four hours for wheezing/shortness of breath with prednisone 40m62mwice daily.   3. TOBACCO ABUSE Continue nicotine patch. Encourage smoking cessation  4. Mixed hyperlipidemia Continue lipitor 47m72mice a week on wed and sat.   5. Insomnia Stable. ontinue melatonin 40mg 60mly at bedtime.    6. Hip fracture, left, sequela Stable. Pain is adequately controlled with current regimen. Continue Percocet 5/3240mg 24mbs every four hours as needed. Continue to work with HH PT/[facility] for gait/strength/balance training to restore/maximize function and f/u with Dr. MurphyPercell Miller risk precautions  7. Colles' fracture of left radius, sequela S/p closed reduction. Poor surgical candidate. Continue percocet 5/3240mg 232ms every four hours as needed for pain. Encourage hand elevation to reduce swelling. Continue to f/u with Dr. Gramig Amedeo Plenty weeks.   8. L2 Compression fracture Acute vs. Chronic. Remains asymptomatic. Continue HH PT/OT. May benefit from outpatient DEXA scan given age, gender, and chronic steriod use.    9. Depression Mood stable. Continue celexa 40mg da67m   10. Leukocytosis Resolved.  PCP to monitor  Home health services:PT/OT DME required: L-arm platform walker, 3-1 PCP follow-up: Dr. McKeown Melford Aase/16 @ 9:30am 30-day supply of prescription medications provided with limited quantity of Percocet 5/3240mg   F70my/Staff Communication Plan of care discussed with patient and nursing staff. Patient and nursing staff verbalized understanding and agree with plan of care. No additional questions or concerns reported.    Damira Kem NguyeArthur HolmsNP-C [facility] ([phone]] After hours: [phone]

## 2015-01-18 DIAGNOSIS — F329 Major depressive disorder, single episode, unspecified: Secondary | ICD-10-CM | POA: Diagnosis not present

## 2015-01-18 DIAGNOSIS — M6281 Muscle weakness (generalized): Secondary | ICD-10-CM | POA: Diagnosis not present

## 2015-01-18 DIAGNOSIS — S72112D Displaced fracture of greater trochanter of left femur, subsequent encounter for closed fracture with routine healing: Secondary | ICD-10-CM | POA: Diagnosis not present

## 2015-01-18 DIAGNOSIS — I10 Essential (primary) hypertension: Secondary | ICD-10-CM | POA: Diagnosis not present

## 2015-01-18 DIAGNOSIS — S32028D Other fracture of second lumbar vertebra, subsequent encounter for fracture with routine healing: Secondary | ICD-10-CM | POA: Diagnosis not present

## 2015-01-18 DIAGNOSIS — Z720 Tobacco use: Secondary | ICD-10-CM | POA: Diagnosis not present

## 2015-01-18 DIAGNOSIS — J449 Chronic obstructive pulmonary disease, unspecified: Secondary | ICD-10-CM | POA: Diagnosis not present

## 2015-01-18 DIAGNOSIS — S52532D Colles' fracture of left radius, subsequent encounter for closed fracture with routine healing: Secondary | ICD-10-CM | POA: Diagnosis not present

## 2015-01-20 DIAGNOSIS — S52532D Colles' fracture of left radius, subsequent encounter for closed fracture with routine healing: Secondary | ICD-10-CM | POA: Diagnosis not present

## 2015-01-20 DIAGNOSIS — J449 Chronic obstructive pulmonary disease, unspecified: Secondary | ICD-10-CM | POA: Diagnosis not present

## 2015-01-20 DIAGNOSIS — F329 Major depressive disorder, single episode, unspecified: Secondary | ICD-10-CM | POA: Diagnosis not present

## 2015-01-20 DIAGNOSIS — S32028D Other fracture of second lumbar vertebra, subsequent encounter for fracture with routine healing: Secondary | ICD-10-CM | POA: Diagnosis not present

## 2015-01-20 DIAGNOSIS — S72112D Displaced fracture of greater trochanter of left femur, subsequent encounter for closed fracture with routine healing: Secondary | ICD-10-CM | POA: Diagnosis not present

## 2015-01-20 DIAGNOSIS — M6281 Muscle weakness (generalized): Secondary | ICD-10-CM | POA: Diagnosis not present

## 2015-01-20 DIAGNOSIS — Z720 Tobacco use: Secondary | ICD-10-CM | POA: Diagnosis not present

## 2015-01-20 DIAGNOSIS — I10 Essential (primary) hypertension: Secondary | ICD-10-CM | POA: Diagnosis not present

## 2015-01-25 DIAGNOSIS — I10 Essential (primary) hypertension: Secondary | ICD-10-CM | POA: Diagnosis not present

## 2015-01-25 DIAGNOSIS — S32028D Other fracture of second lumbar vertebra, subsequent encounter for fracture with routine healing: Secondary | ICD-10-CM | POA: Diagnosis not present

## 2015-01-25 DIAGNOSIS — M6281 Muscle weakness (generalized): Secondary | ICD-10-CM | POA: Diagnosis not present

## 2015-01-25 DIAGNOSIS — J449 Chronic obstructive pulmonary disease, unspecified: Secondary | ICD-10-CM | POA: Diagnosis not present

## 2015-01-25 DIAGNOSIS — F329 Major depressive disorder, single episode, unspecified: Secondary | ICD-10-CM | POA: Diagnosis not present

## 2015-01-25 DIAGNOSIS — Z720 Tobacco use: Secondary | ICD-10-CM | POA: Diagnosis not present

## 2015-01-25 DIAGNOSIS — S52532D Colles' fracture of left radius, subsequent encounter for closed fracture with routine healing: Secondary | ICD-10-CM | POA: Diagnosis not present

## 2015-01-25 DIAGNOSIS — S72112D Displaced fracture of greater trochanter of left femur, subsequent encounter for closed fracture with routine healing: Secondary | ICD-10-CM | POA: Diagnosis not present

## 2015-01-26 ENCOUNTER — Ambulatory Visit: Payer: Self-pay | Admitting: Internal Medicine

## 2015-01-26 DIAGNOSIS — I10 Essential (primary) hypertension: Secondary | ICD-10-CM | POA: Diagnosis not present

## 2015-01-26 DIAGNOSIS — S72112D Displaced fracture of greater trochanter of left femur, subsequent encounter for closed fracture with routine healing: Secondary | ICD-10-CM | POA: Diagnosis not present

## 2015-01-26 DIAGNOSIS — M6281 Muscle weakness (generalized): Secondary | ICD-10-CM | POA: Diagnosis not present

## 2015-01-26 DIAGNOSIS — S52532D Colles' fracture of left radius, subsequent encounter for closed fracture with routine healing: Secondary | ICD-10-CM | POA: Diagnosis not present

## 2015-01-26 DIAGNOSIS — S32028D Other fracture of second lumbar vertebra, subsequent encounter for fracture with routine healing: Secondary | ICD-10-CM | POA: Diagnosis not present

## 2015-01-26 DIAGNOSIS — J449 Chronic obstructive pulmonary disease, unspecified: Secondary | ICD-10-CM | POA: Diagnosis not present

## 2015-01-26 DIAGNOSIS — Z720 Tobacco use: Secondary | ICD-10-CM | POA: Diagnosis not present

## 2015-01-26 DIAGNOSIS — F329 Major depressive disorder, single episode, unspecified: Secondary | ICD-10-CM | POA: Diagnosis not present

## 2015-01-27 DIAGNOSIS — I10 Essential (primary) hypertension: Secondary | ICD-10-CM | POA: Diagnosis not present

## 2015-01-27 DIAGNOSIS — M6281 Muscle weakness (generalized): Secondary | ICD-10-CM | POA: Diagnosis not present

## 2015-01-27 DIAGNOSIS — S72112D Displaced fracture of greater trochanter of left femur, subsequent encounter for closed fracture with routine healing: Secondary | ICD-10-CM | POA: Diagnosis not present

## 2015-01-27 DIAGNOSIS — J449 Chronic obstructive pulmonary disease, unspecified: Secondary | ICD-10-CM | POA: Diagnosis not present

## 2015-01-27 DIAGNOSIS — S32028D Other fracture of second lumbar vertebra, subsequent encounter for fracture with routine healing: Secondary | ICD-10-CM | POA: Diagnosis not present

## 2015-01-27 DIAGNOSIS — S52532D Colles' fracture of left radius, subsequent encounter for closed fracture with routine healing: Secondary | ICD-10-CM | POA: Diagnosis not present

## 2015-01-27 DIAGNOSIS — F329 Major depressive disorder, single episode, unspecified: Secondary | ICD-10-CM | POA: Diagnosis not present

## 2015-01-27 DIAGNOSIS — Z720 Tobacco use: Secondary | ICD-10-CM | POA: Diagnosis not present

## 2015-01-28 DIAGNOSIS — S72112D Displaced fracture of greater trochanter of left femur, subsequent encounter for closed fracture with routine healing: Secondary | ICD-10-CM | POA: Diagnosis not present

## 2015-01-28 DIAGNOSIS — S52502D Unspecified fracture of the lower end of left radius, subsequent encounter for closed fracture with routine healing: Secondary | ICD-10-CM | POA: Diagnosis not present

## 2015-01-28 DIAGNOSIS — J449 Chronic obstructive pulmonary disease, unspecified: Secondary | ICD-10-CM | POA: Diagnosis not present

## 2015-01-30 ENCOUNTER — Encounter: Payer: Self-pay | Admitting: Internal Medicine

## 2015-01-30 NOTE — Progress Notes (Signed)
Patient ID: Hailey Keith, female   DOB: [birth year], 80 y.o.   MRN: [id number]   This very nice 80 y.o. [facility] presents for 3 month follow up with Severe End-Stage COPD, s/p Lung Ca, Hypertension, Hyperlipidemia, Pre-Diabetes and Vitamin D Deficiency.    Patient was recently hospitalized after a fall April 23-26/2016 for Lt wrist fx with closed reduction, Minor avulsion fx Lt hip Gr Trochanter w/o major fx, acute compression fx of L2 Vertebrae and subsequently incarcerated at a [facility] 4/24-5/13/2016 and has been home since with [facility] LPT. She is back at the house, currently she is only touch down on her left hip, and then June 3rd she will be released for weight bearing, she uses a walker when she can. She is doing PT at home and is now starting OT since her cast is off. It is very difficult for her to leave the house due to her arthritis and COPD.  In addition she has COPD, started back to smoking, on prednisone 10 mg daily for her COPD, she has O2 at home and wears it at night and with walking. Here she is at 93% RA.    Patient is treated for HTN & BP has been controlled at home. Today's BP: 114/62 mmHg. Patient has had no complaints of any cardiac type chest pain, palpitations, dyspnea/orthopnea/PND, dizziness, claudication, or dependent edema.   Hyperlipidemia is controlled with diet & meds. Patient denies myalgias or other med SE's. Last Lipids were Total  Chol 164; HDL 62; LDL  87; Trig 74 on 11/02/2014.   Also, the patient has history of PreDiabetes and has had no symptoms of reactive hypoglycemia, diabetic polys, paresthesias or visual blurring.  Last A1c was 6.1% on  11/02/2014.    Further, the patient also has history of Vitamin D Deficiency and supplements vitamin D without any suspected side-effects. Last vitamin D was 89 on 11/02/2014.     Medication Sig  . albuterol  HFA inhaler 1 to 2 inhalations every 4 hours as needed for Rescue Asthma  . aspirin 81 MG tablet Take 81 mg by mouth daily.  Marland Kitchen  atorvastatin 80 MG tablet Take 80 mg by mouth 2 (two) times a week. WED AND SAT  . citalopram 40 MG tablet TAKE 1 TABLET BY MOUTH DAILY FOR MOOD  . MUCINEX DM Take 1 tablet by mouth 2 (two) times daily.  Marland Kitchen doxazosin  8 MG tablet TAKE 1 TABLET AT BEDTIME  . DUONEB 0.5-2.5 (3) MG/3ML  Take 3 mLs by nebulization 4 (four) times daily.  Marland Kitchen losartan  100 MG tablet Take 100 mg by mouth daily.  . Melatonin 5 MG CAPS Take 5 mg by mouth at bedtime.  Marland Kitchen NICODERM CQ 21 mg/24hr patch Place 1 patch (21 mg total) onto the skin daily.  Marland Kitchen oxyCODONE-acetaminophen  5-325  Take two tablets by mouth every four hours as needed for pain.  . predniSONE 5 MG tablet Take 1 tablet (5 mg total) by mouth 2 (two) times daily with a meal.   Allergies  Allergen Reactions  . Paxil [Paroxetine Hcl] Other (See Comments)    UNKNOWN   PMHx:   Past Medical History  Diagnosis Date  . Hypertension   . Dyslipidemia   . Hard of hearing   . Hx of radiation therapy 06/18/13- 07/23/13    LUL lung primary, 7020 cGy 26 sessions  . 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 Skin cancer     basal cell  .  COPD (chronic obstructive pulmonary disease)   . prediabetes    Immunization History  Administered Date(s) Administered  . Influenza, High Dose Seasonal PF 07/22/2014  . Influenza,inj,Quad PF,36+ Mos 05/14/2013  . Pneumococcal Conjugate-13 04/29/2014  . Pneumococcal Polysaccharide-23 07/21/2009  . Td 03/20/2012  . Zoster 09/04/2009   Past Surgical History  Procedure Laterality Date  . Tonsillectomy      as child, repeat surgery early 20's  . Mohs surgery      "several times"  . Facial reconstruction surgery  25 years ago    to remove skin cancer, right side of face   FHx:    Reviewed / unchanged  SHx:    Reviewed / unchanged  Systems Review:  Constitutional: Denies fever, chills, wt changes, headaches, insomnia, fatigue, night sweats, change in appetite. Eyes: Denies redness, blurred vision, diplopia,  discharge, itchy, watery eyes.  ENT: Denies discharge, congestion, post nasal drip, epistaxis, sore throat, earache, hearing loss, dental pain, tinnitus, vertigo, sinus pain, snoring.  CV: Denies chest pain, palpitations, irregular heartbeat, syncope, dyspnea, diaphoresis, orthopnea, PND, claudication or edema. Respiratory: denies cough, dyspnea, DOE, pleurisy, hoarseness, laryngitis, wheezing.  Gastrointestinal: Denies dysphagia, odynophagia, heartburn, reflux, water brash, abdominal pain or cramps, nausea, vomiting, bloating, diarrhea, constipation, hematemesis, melena, hematochezia  or hemorrhoids. Genitourinary: Denies dysuria, frequency, urgency, nocturia, hesitancy, discharge, hematuria or flank pain. Musculoskeletal: + arthralgias, myalgias, stiffness, jt.  Pain Denies limping, strain/sprain.  Skin: Denies pruritus, rash, hives, warts, acne, eczema or change in skin lesion(s). Neuro: No weakness, tremor, incoordination, spasms, paresthesia or pain. Psychiatric: Denies confusion, memory loss or sensory loss. Endo: Denies change in weight, skin or hair change.  Heme/Lymph: No excessive bleeding, bruising or enlarged lymph nodes.  Physical Exam  BP 114/62 mmHg  Pulse 72  Temp(Src) 97 F (36.1 C)  Resp 20  Ht '5\' 4"'$  (1.626 m)  Appears well nourished and in no distress. Eyes: PERRLA, EOMs, conjunctiva no swelling or erythema. Sinuses: No frontal/maxillary tenderness ENT/Mouth: EAC's clear, TM's nl w/o erythema, bulging. Nares clear w/o erythema, swelling, exudates. Oropharynx clear without erythema or exudates. Oral hygiene is good. Tongue normal, non obstructing. Hearing intact.  Neck: Supple. Thyroid nl. Car 2+/2+ without bruits, nodes or JVD. Chest: Respirations decrease breath sounds w/o rales, rhonchi, wheezing or stridor.  Cor: Heart sounds normal w/ regular rate and rhythm without sig. murmurs, gallops, clicks, or rubs. Peripheral pulses normal and equal  With mild edema left leg   Abdomen: Soft & bowel sounds normal. Non-tender w/o guarding, rebound, hernias, masses, or organomegaly.  Lymphatics: Unremarkable.  Musculoskeletal: decreased ROM left leg and left arm in a cast, patient in a wheel chair due to non weight bearing left leg and long distance Skin: Warm, dry without exposed rashes, lesions or ecchymosis apparent.  Neuro: Cranial nerves intact, reflexes equal bilaterally. Sensory-motor testing grossly intact. Tendon reflexes grossly intact.  Pysch: Alert & oriented x 3.  Insight and judgement nl & appropriate. No ideations.  Assessment and Plan: 1. Essential hypertension Monitor for hypotension  2. Mixed hyperlipidemia -continue medications, check lipids, decrease fatty foods, increase activity.   3. Prediabetes Discussed general issues about diabetes pathophysiology and management., Educational material distributed., Suggested low cholesterol diet., Encouraged aerobic exercise., Discussed foot care., Reminded to get yearly retinal exam.  4. Vitamin D deficiency Continue supplement  5. Chronic obstructive pulmonary disease, unspecified COPD, unspecified chronic bronchitis type Continue prednisone for now, lungs CTAB  6. Medication management - CBC with Differential/Platelet - BASIC METABOLIC PANEL WITH GFR -  Hepatic function panel - Magnesium  7. TOBACCO ABUSE Smoking cessation-  instruction/counseling given, counseled patient on the dangers of tobacco use, advised patient to stop smoking, and reviewed strategies to maximize success, patient not ready to quit at this time.    8. Face to face Patient needs PT/OT at home due to recent fall and it is very taxing effort for her to leave her house due to imbalance, left hip fracture, and COPD.    Recommended regular exercise, BP monitoring, weight control, and discussed med and SE's. Recommended labs to assess and monitor clinical status. Further disposition pending results of labs. Over 30 minutes of  exam, counseling, chart review was performed

## 2015-02-01 ENCOUNTER — Encounter: Payer: Self-pay | Admitting: Physician Assistant

## 2015-02-01 ENCOUNTER — Ambulatory Visit (INDEPENDENT_AMBULATORY_CARE_PROVIDER_SITE_OTHER): Payer: Medicare Other | Admitting: Physician Assistant

## 2015-02-01 VITALS — BP 114/62 | HR 72 | Temp 97.0°F | Resp 20 | Ht 64.0 in

## 2015-02-01 DIAGNOSIS — J449 Chronic obstructive pulmonary disease, unspecified: Secondary | ICD-10-CM

## 2015-02-01 DIAGNOSIS — R7309 Other abnormal glucose: Secondary | ICD-10-CM | POA: Diagnosis not present

## 2015-02-01 DIAGNOSIS — E559 Vitamin D deficiency, unspecified: Secondary | ICD-10-CM | POA: Diagnosis not present

## 2015-02-01 DIAGNOSIS — E782 Mixed hyperlipidemia: Secondary | ICD-10-CM | POA: Diagnosis not present

## 2015-02-01 DIAGNOSIS — F17200 Nicotine dependence, unspecified, uncomplicated: Secondary | ICD-10-CM

## 2015-02-01 DIAGNOSIS — I10 Essential (primary) hypertension: Secondary | ICD-10-CM | POA: Diagnosis not present

## 2015-02-01 DIAGNOSIS — Z79899 Other long term (current) drug therapy: Secondary | ICD-10-CM | POA: Diagnosis not present

## 2015-02-01 DIAGNOSIS — R7303 Prediabetes: Secondary | ICD-10-CM

## 2015-02-01 LAB — BASIC METABOLIC PANEL WITH GFR
BUN: 16 mg/dL (ref 6–23)
CO2: 32 mEq/L (ref 19–32)
Calcium: 9.3 mg/dL (ref 8.4–10.5)
Chloride: 100 mEq/L (ref 96–112)
Creat: 0.68 mg/dL (ref 0.50–1.10)
GFR, EST NON AFRICAN AMERICAN: 85 mL/min
Glucose, Bld: 142 mg/dL — ABNORMAL HIGH (ref 70–99)
Potassium: 4.3 mEq/L (ref 3.5–5.3)
Sodium: 141 mEq/L (ref 135–145)

## 2015-02-01 LAB — CBC WITH DIFFERENTIAL/PLATELET
Basophils Absolute: 0 10*3/uL (ref 0.0–0.1)
Basophils Relative: 0 % (ref 0–1)
Eosinophils Absolute: 0.1 10*3/uL (ref 0.0–0.7)
Eosinophils Relative: 1 % (ref 0–5)
HEMATOCRIT: 42.4 % (ref 36.0–46.0)
Hemoglobin: 13.6 g/dL (ref 12.0–15.0)
LYMPHS ABS: 1.1 10*3/uL (ref 0.7–4.0)
LYMPHS PCT: 9 % — AB (ref 12–46)
MCH: 29.7 pg (ref 26.0–34.0)
MCHC: 32.1 g/dL (ref 30.0–36.0)
MCV: 92.6 fL (ref 78.0–100.0)
MONOS PCT: 7 % (ref 3–12)
MPV: 10.9 fL (ref 8.6–12.4)
Monocytes Absolute: 0.8 10*3/uL (ref 0.1–1.0)
NEUTROS ABS: 9.8 10*3/uL — AB (ref 1.7–7.7)
Neutrophils Relative %: 83 % — ABNORMAL HIGH (ref 43–77)
Platelets: 240 10*3/uL (ref 150–400)
RBC: 4.58 MIL/uL (ref 3.87–5.11)
RDW: 14.1 % (ref 11.5–15.5)
WBC: 11.8 10*3/uL — AB (ref 4.0–10.5)

## 2015-02-01 LAB — HEPATIC FUNCTION PANEL
ALBUMIN: 3.8 g/dL (ref 3.5–5.2)
ALT: 18 U/L (ref 0–35)
AST: 20 U/L (ref 0–37)
Alkaline Phosphatase: 69 U/L (ref 39–117)
BILIRUBIN TOTAL: 0.6 mg/dL (ref 0.2–1.2)
Bilirubin, Direct: 0.1 mg/dL (ref 0.0–0.3)
Indirect Bilirubin: 0.5 mg/dL (ref 0.2–1.2)
TOTAL PROTEIN: 5.9 g/dL — AB (ref 6.0–8.3)

## 2015-02-01 LAB — MAGNESIUM: MAGNESIUM: 1.8 mg/dL (ref 1.5–2.5)

## 2015-02-01 NOTE — Patient Instructions (Signed)
Please monitor your blood pressure, as we get older our body can not respond to a low blood pressure as well as it did when we were younger, for this reason we want a bit higher of a blood pressure as you get older to avoid dizziness and fatigue which can lead to falls. Pease call if your blood pressure is consistently above 150/90.   Please monitor your blood pressure. If it is getting below 130/80 AND you are having fatigue with exertion, dizziness we may need to cut your blood pressure medication in half. Please call the office if this is happening. Hypotension As your heart beats, it forces blood through your body. This force is called blood pressure. If you have hypotension, you have low blood pressure. When your blood pressure is too low, you may not get enough blood to your brain. You may feel weak, feel lightheaded, have a fast heartbeat, or even pass out (faint). HOME CARE  Drink enough fluids to keep your pee (urine) clear or pale yellow.  Take all medicines as told by your doctor.  Get up slowly after sitting or lying down.  Wear support stockings as told by your doctor.  Maintain a healthy diet by including foods such as fruits, vegetables, nuts, whole grains, and lean meats. GET HELP IF:  You are throwing up (vomiting) or have watery poop (diarrhea).  You have a fever for more than 2-3 days.  You feel more thirsty than usual.  You feel weak and tired. GET HELP RIGHT AWAY IF:   You pass out (faint).  You have chest pain or a fast or irregular heartbeat.  You lose feeling in part of your body.  You cannot move your arms or legs.  You have trouble speaking.  You get sweaty or feel lightheaded. MAKE SURE YOU:   Understand these instructions.  Will watch your condition.  Will get help right away if you are not doing well or get worse. Document Released: 11/14/2009 Document Revised: 04/22/2013 Document Reviewed: 02/20/2013 [facility] Patient Information 2015  [place], [place]. This information is not intended to replace advice given to you by your health care provider. Make sure you discuss any questions you have with your health care provider.

## 2015-02-02 ENCOUNTER — Ambulatory Visit: Payer: Self-pay | Admitting: Internal Medicine

## 2015-02-02 DIAGNOSIS — S72112D Displaced fracture of greater trochanter of left femur, subsequent encounter for closed fracture with routine healing: Secondary | ICD-10-CM | POA: Diagnosis not present

## 2015-02-02 DIAGNOSIS — I10 Essential (primary) hypertension: Secondary | ICD-10-CM | POA: Diagnosis not present

## 2015-02-02 DIAGNOSIS — Z720 Tobacco use: Secondary | ICD-10-CM | POA: Diagnosis not present

## 2015-02-02 DIAGNOSIS — F329 Major depressive disorder, single episode, unspecified: Secondary | ICD-10-CM | POA: Diagnosis not present

## 2015-02-02 DIAGNOSIS — J449 Chronic obstructive pulmonary disease, unspecified: Secondary | ICD-10-CM | POA: Diagnosis not present

## 2015-02-02 DIAGNOSIS — S52532D Colles' fracture of left radius, subsequent encounter for closed fracture with routine healing: Secondary | ICD-10-CM | POA: Diagnosis not present

## 2015-02-02 DIAGNOSIS — M6281 Muscle weakness (generalized): Secondary | ICD-10-CM | POA: Diagnosis not present

## 2015-02-02 DIAGNOSIS — S32028D Other fracture of second lumbar vertebra, subsequent encounter for fracture with routine healing: Secondary | ICD-10-CM | POA: Diagnosis not present

## 2015-02-03 DIAGNOSIS — S32028D Other fracture of second lumbar vertebra, subsequent encounter for fracture with routine healing: Secondary | ICD-10-CM | POA: Diagnosis not present

## 2015-02-03 DIAGNOSIS — S72112D Displaced fracture of greater trochanter of left femur, subsequent encounter for closed fracture with routine healing: Secondary | ICD-10-CM | POA: Diagnosis not present

## 2015-02-03 DIAGNOSIS — Z720 Tobacco use: Secondary | ICD-10-CM | POA: Diagnosis not present

## 2015-02-03 DIAGNOSIS — S52532D Colles' fracture of left radius, subsequent encounter for closed fracture with routine healing: Secondary | ICD-10-CM | POA: Diagnosis not present

## 2015-02-03 DIAGNOSIS — I10 Essential (primary) hypertension: Secondary | ICD-10-CM | POA: Diagnosis not present

## 2015-02-03 DIAGNOSIS — J449 Chronic obstructive pulmonary disease, unspecified: Secondary | ICD-10-CM | POA: Diagnosis not present

## 2015-02-03 DIAGNOSIS — F329 Major depressive disorder, single episode, unspecified: Secondary | ICD-10-CM | POA: Diagnosis not present

## 2015-02-03 DIAGNOSIS — M6281 Muscle weakness (generalized): Secondary | ICD-10-CM | POA: Diagnosis not present

## 2015-02-04 DIAGNOSIS — S52532D Colles' fracture of left radius, subsequent encounter for closed fracture with routine healing: Secondary | ICD-10-CM | POA: Diagnosis not present

## 2015-02-04 DIAGNOSIS — J449 Chronic obstructive pulmonary disease, unspecified: Secondary | ICD-10-CM | POA: Diagnosis not present

## 2015-02-04 DIAGNOSIS — S32028D Other fracture of second lumbar vertebra, subsequent encounter for fracture with routine healing: Secondary | ICD-10-CM | POA: Diagnosis not present

## 2015-02-04 DIAGNOSIS — M6281 Muscle weakness (generalized): Secondary | ICD-10-CM | POA: Diagnosis not present

## 2015-02-04 DIAGNOSIS — I10 Essential (primary) hypertension: Secondary | ICD-10-CM | POA: Diagnosis not present

## 2015-02-04 DIAGNOSIS — Z720 Tobacco use: Secondary | ICD-10-CM | POA: Diagnosis not present

## 2015-02-04 DIAGNOSIS — S72112D Displaced fracture of greater trochanter of left femur, subsequent encounter for closed fracture with routine healing: Secondary | ICD-10-CM | POA: Diagnosis not present

## 2015-02-04 DIAGNOSIS — F329 Major depressive disorder, single episode, unspecified: Secondary | ICD-10-CM | POA: Diagnosis not present

## 2015-02-08 DIAGNOSIS — Z720 Tobacco use: Secondary | ICD-10-CM | POA: Diagnosis not present

## 2015-02-08 DIAGNOSIS — F329 Major depressive disorder, single episode, unspecified: Secondary | ICD-10-CM | POA: Diagnosis not present

## 2015-02-08 DIAGNOSIS — S32028D Other fracture of second lumbar vertebra, subsequent encounter for fracture with routine healing: Secondary | ICD-10-CM | POA: Diagnosis not present

## 2015-02-08 DIAGNOSIS — J449 Chronic obstructive pulmonary disease, unspecified: Secondary | ICD-10-CM | POA: Diagnosis not present

## 2015-02-08 DIAGNOSIS — I10 Essential (primary) hypertension: Secondary | ICD-10-CM | POA: Diagnosis not present

## 2015-02-08 DIAGNOSIS — S72112D Displaced fracture of greater trochanter of left femur, subsequent encounter for closed fracture with routine healing: Secondary | ICD-10-CM | POA: Diagnosis not present

## 2015-02-08 DIAGNOSIS — S52532D Colles' fracture of left radius, subsequent encounter for closed fracture with routine healing: Secondary | ICD-10-CM | POA: Diagnosis not present

## 2015-02-08 DIAGNOSIS — M6281 Muscle weakness (generalized): Secondary | ICD-10-CM | POA: Diagnosis not present

## 2015-02-09 DIAGNOSIS — Z720 Tobacco use: Secondary | ICD-10-CM | POA: Diagnosis not present

## 2015-02-09 DIAGNOSIS — S32028D Other fracture of second lumbar vertebra, subsequent encounter for fracture with routine healing: Secondary | ICD-10-CM | POA: Diagnosis not present

## 2015-02-09 DIAGNOSIS — S52532D Colles' fracture of left radius, subsequent encounter for closed fracture with routine healing: Secondary | ICD-10-CM | POA: Diagnosis not present

## 2015-02-09 DIAGNOSIS — M6281 Muscle weakness (generalized): Secondary | ICD-10-CM | POA: Diagnosis not present

## 2015-02-09 DIAGNOSIS — J449 Chronic obstructive pulmonary disease, unspecified: Secondary | ICD-10-CM | POA: Diagnosis not present

## 2015-02-09 DIAGNOSIS — I10 Essential (primary) hypertension: Secondary | ICD-10-CM | POA: Diagnosis not present

## 2015-02-09 DIAGNOSIS — F329 Major depressive disorder, single episode, unspecified: Secondary | ICD-10-CM | POA: Diagnosis not present

## 2015-02-09 DIAGNOSIS — S72112D Displaced fracture of greater trochanter of left femur, subsequent encounter for closed fracture with routine healing: Secondary | ICD-10-CM | POA: Diagnosis not present

## 2015-02-14 DIAGNOSIS — M6281 Muscle weakness (generalized): Secondary | ICD-10-CM | POA: Diagnosis not present

## 2015-02-14 DIAGNOSIS — S52532D Colles' fracture of left radius, subsequent encounter for closed fracture with routine healing: Secondary | ICD-10-CM | POA: Diagnosis not present

## 2015-02-14 DIAGNOSIS — J449 Chronic obstructive pulmonary disease, unspecified: Secondary | ICD-10-CM | POA: Diagnosis not present

## 2015-02-14 DIAGNOSIS — F329 Major depressive disorder, single episode, unspecified: Secondary | ICD-10-CM | POA: Diagnosis not present

## 2015-02-14 DIAGNOSIS — I10 Essential (primary) hypertension: Secondary | ICD-10-CM | POA: Diagnosis not present

## 2015-02-14 DIAGNOSIS — S32028D Other fracture of second lumbar vertebra, subsequent encounter for fracture with routine healing: Secondary | ICD-10-CM | POA: Diagnosis not present

## 2015-02-14 DIAGNOSIS — Z720 Tobacco use: Secondary | ICD-10-CM | POA: Diagnosis not present

## 2015-02-14 DIAGNOSIS — S72112D Displaced fracture of greater trochanter of left femur, subsequent encounter for closed fracture with routine healing: Secondary | ICD-10-CM | POA: Diagnosis not present

## 2015-02-16 DIAGNOSIS — S72112D Displaced fracture of greater trochanter of left femur, subsequent encounter for closed fracture with routine healing: Secondary | ICD-10-CM | POA: Diagnosis not present

## 2015-02-16 DIAGNOSIS — I10 Essential (primary) hypertension: Secondary | ICD-10-CM | POA: Diagnosis not present

## 2015-02-16 DIAGNOSIS — S32028D Other fracture of second lumbar vertebra, subsequent encounter for fracture with routine healing: Secondary | ICD-10-CM | POA: Diagnosis not present

## 2015-02-16 DIAGNOSIS — Z720 Tobacco use: Secondary | ICD-10-CM | POA: Diagnosis not present

## 2015-02-16 DIAGNOSIS — F329 Major depressive disorder, single episode, unspecified: Secondary | ICD-10-CM | POA: Diagnosis not present

## 2015-02-16 DIAGNOSIS — M6281 Muscle weakness (generalized): Secondary | ICD-10-CM | POA: Diagnosis not present

## 2015-02-16 DIAGNOSIS — S52532D Colles' fracture of left radius, subsequent encounter for closed fracture with routine healing: Secondary | ICD-10-CM | POA: Diagnosis not present

## 2015-02-16 DIAGNOSIS — J449 Chronic obstructive pulmonary disease, unspecified: Secondary | ICD-10-CM | POA: Diagnosis not present

## 2015-02-21 DIAGNOSIS — S72112D Displaced fracture of greater trochanter of left femur, subsequent encounter for closed fracture with routine healing: Secondary | ICD-10-CM | POA: Diagnosis not present

## 2015-02-21 DIAGNOSIS — Z720 Tobacco use: Secondary | ICD-10-CM | POA: Diagnosis not present

## 2015-02-21 DIAGNOSIS — I10 Essential (primary) hypertension: Secondary | ICD-10-CM | POA: Diagnosis not present

## 2015-02-21 DIAGNOSIS — F329 Major depressive disorder, single episode, unspecified: Secondary | ICD-10-CM | POA: Diagnosis not present

## 2015-02-21 DIAGNOSIS — M6281 Muscle weakness (generalized): Secondary | ICD-10-CM | POA: Diagnosis not present

## 2015-02-21 DIAGNOSIS — S32028D Other fracture of second lumbar vertebra, subsequent encounter for fracture with routine healing: Secondary | ICD-10-CM | POA: Diagnosis not present

## 2015-02-21 DIAGNOSIS — S52532D Colles' fracture of left radius, subsequent encounter for closed fracture with routine healing: Secondary | ICD-10-CM | POA: Diagnosis not present

## 2015-02-21 DIAGNOSIS — J449 Chronic obstructive pulmonary disease, unspecified: Secondary | ICD-10-CM | POA: Diagnosis not present

## 2015-02-23 DIAGNOSIS — I10 Essential (primary) hypertension: Secondary | ICD-10-CM | POA: Diagnosis not present

## 2015-02-23 DIAGNOSIS — S72112D Displaced fracture of greater trochanter of left femur, subsequent encounter for closed fracture with routine healing: Secondary | ICD-10-CM | POA: Diagnosis not present

## 2015-02-23 DIAGNOSIS — S52532D Colles' fracture of left radius, subsequent encounter for closed fracture with routine healing: Secondary | ICD-10-CM | POA: Diagnosis not present

## 2015-02-23 DIAGNOSIS — J449 Chronic obstructive pulmonary disease, unspecified: Secondary | ICD-10-CM | POA: Diagnosis not present

## 2015-02-23 DIAGNOSIS — F329 Major depressive disorder, single episode, unspecified: Secondary | ICD-10-CM | POA: Diagnosis not present

## 2015-02-23 DIAGNOSIS — S32028D Other fracture of second lumbar vertebra, subsequent encounter for fracture with routine healing: Secondary | ICD-10-CM | POA: Diagnosis not present

## 2015-02-23 DIAGNOSIS — M6281 Muscle weakness (generalized): Secondary | ICD-10-CM | POA: Diagnosis not present

## 2015-02-23 DIAGNOSIS — Z720 Tobacco use: Secondary | ICD-10-CM | POA: Diagnosis not present

## 2015-02-25 DIAGNOSIS — S52502D Unspecified fracture of the lower end of left radius, subsequent encounter for closed fracture with routine healing: Secondary | ICD-10-CM | POA: Diagnosis not present

## 2015-02-25 DIAGNOSIS — S72112D Displaced fracture of greater trochanter of left femur, subsequent encounter for closed fracture with routine healing: Secondary | ICD-10-CM | POA: Diagnosis not present

## 2015-02-28 DIAGNOSIS — S52532D Colles' fracture of left radius, subsequent encounter for closed fracture with routine healing: Secondary | ICD-10-CM | POA: Diagnosis not present

## 2015-02-28 DIAGNOSIS — F329 Major depressive disorder, single episode, unspecified: Secondary | ICD-10-CM | POA: Diagnosis not present

## 2015-02-28 DIAGNOSIS — I10 Essential (primary) hypertension: Secondary | ICD-10-CM | POA: Diagnosis not present

## 2015-02-28 DIAGNOSIS — M6281 Muscle weakness (generalized): Secondary | ICD-10-CM | POA: Diagnosis not present

## 2015-02-28 DIAGNOSIS — J449 Chronic obstructive pulmonary disease, unspecified: Secondary | ICD-10-CM | POA: Diagnosis not present

## 2015-02-28 DIAGNOSIS — S32028D Other fracture of second lumbar vertebra, subsequent encounter for fracture with routine healing: Secondary | ICD-10-CM | POA: Diagnosis not present

## 2015-02-28 DIAGNOSIS — S72112D Displaced fracture of greater trochanter of left femur, subsequent encounter for closed fracture with routine healing: Secondary | ICD-10-CM | POA: Diagnosis not present

## 2015-02-28 DIAGNOSIS — Z720 Tobacco use: Secondary | ICD-10-CM | POA: Diagnosis not present

## 2015-03-01 ENCOUNTER — Other Ambulatory Visit: Payer: Self-pay | Admitting: Internal Medicine

## 2015-03-02 DIAGNOSIS — S72112D Displaced fracture of greater trochanter of left femur, subsequent encounter for closed fracture with routine healing: Secondary | ICD-10-CM | POA: Diagnosis not present

## 2015-03-02 DIAGNOSIS — Z720 Tobacco use: Secondary | ICD-10-CM | POA: Diagnosis not present

## 2015-03-02 DIAGNOSIS — S52532D Colles' fracture of left radius, subsequent encounter for closed fracture with routine healing: Secondary | ICD-10-CM | POA: Diagnosis not present

## 2015-03-02 DIAGNOSIS — M6281 Muscle weakness (generalized): Secondary | ICD-10-CM | POA: Diagnosis not present

## 2015-03-02 DIAGNOSIS — S32028D Other fracture of second lumbar vertebra, subsequent encounter for fracture with routine healing: Secondary | ICD-10-CM | POA: Diagnosis not present

## 2015-03-02 DIAGNOSIS — F329 Major depressive disorder, single episode, unspecified: Secondary | ICD-10-CM | POA: Diagnosis not present

## 2015-03-02 DIAGNOSIS — I10 Essential (primary) hypertension: Secondary | ICD-10-CM | POA: Diagnosis not present

## 2015-03-02 DIAGNOSIS — J449 Chronic obstructive pulmonary disease, unspecified: Secondary | ICD-10-CM | POA: Diagnosis not present

## 2015-03-08 DIAGNOSIS — S52532D Colles' fracture of left radius, subsequent encounter for closed fracture with routine healing: Secondary | ICD-10-CM | POA: Diagnosis not present

## 2015-03-08 DIAGNOSIS — F329 Major depressive disorder, single episode, unspecified: Secondary | ICD-10-CM | POA: Diagnosis not present

## 2015-03-08 DIAGNOSIS — Z720 Tobacco use: Secondary | ICD-10-CM | POA: Diagnosis not present

## 2015-03-08 DIAGNOSIS — S32028D Other fracture of second lumbar vertebra, subsequent encounter for fracture with routine healing: Secondary | ICD-10-CM | POA: Diagnosis not present

## 2015-03-08 DIAGNOSIS — S72112D Displaced fracture of greater trochanter of left femur, subsequent encounter for closed fracture with routine healing: Secondary | ICD-10-CM | POA: Diagnosis not present

## 2015-03-08 DIAGNOSIS — I10 Essential (primary) hypertension: Secondary | ICD-10-CM | POA: Diagnosis not present

## 2015-03-08 DIAGNOSIS — M6281 Muscle weakness (generalized): Secondary | ICD-10-CM | POA: Diagnosis not present

## 2015-03-08 DIAGNOSIS — J449 Chronic obstructive pulmonary disease, unspecified: Secondary | ICD-10-CM | POA: Diagnosis not present

## 2015-03-09 ENCOUNTER — Ambulatory Visit: Payer: Self-pay | Admitting: Internal Medicine

## 2015-03-10 DIAGNOSIS — I10 Essential (primary) hypertension: Secondary | ICD-10-CM | POA: Diagnosis not present

## 2015-03-10 DIAGNOSIS — S72112D Displaced fracture of greater trochanter of left femur, subsequent encounter for closed fracture with routine healing: Secondary | ICD-10-CM | POA: Diagnosis not present

## 2015-03-10 DIAGNOSIS — S32028D Other fracture of second lumbar vertebra, subsequent encounter for fracture with routine healing: Secondary | ICD-10-CM | POA: Diagnosis not present

## 2015-03-10 DIAGNOSIS — F329 Major depressive disorder, single episode, unspecified: Secondary | ICD-10-CM | POA: Diagnosis not present

## 2015-03-10 DIAGNOSIS — M6281 Muscle weakness (generalized): Secondary | ICD-10-CM | POA: Diagnosis not present

## 2015-03-10 DIAGNOSIS — S52532D Colles' fracture of left radius, subsequent encounter for closed fracture with routine healing: Secondary | ICD-10-CM | POA: Diagnosis not present

## 2015-03-10 DIAGNOSIS — J449 Chronic obstructive pulmonary disease, unspecified: Secondary | ICD-10-CM | POA: Diagnosis not present

## 2015-03-10 DIAGNOSIS — Z720 Tobacco use: Secondary | ICD-10-CM | POA: Diagnosis not present

## 2015-03-14 DIAGNOSIS — M6281 Muscle weakness (generalized): Secondary | ICD-10-CM | POA: Diagnosis not present

## 2015-03-14 DIAGNOSIS — S72112D Displaced fracture of greater trochanter of left femur, subsequent encounter for closed fracture with routine healing: Secondary | ICD-10-CM | POA: Diagnosis not present

## 2015-03-14 DIAGNOSIS — I10 Essential (primary) hypertension: Secondary | ICD-10-CM | POA: Diagnosis not present

## 2015-03-14 DIAGNOSIS — Z720 Tobacco use: Secondary | ICD-10-CM | POA: Diagnosis not present

## 2015-03-14 DIAGNOSIS — S52532D Colles' fracture of left radius, subsequent encounter for closed fracture with routine healing: Secondary | ICD-10-CM | POA: Diagnosis not present

## 2015-03-14 DIAGNOSIS — S32028D Other fracture of second lumbar vertebra, subsequent encounter for fracture with routine healing: Secondary | ICD-10-CM | POA: Diagnosis not present

## 2015-03-14 DIAGNOSIS — J449 Chronic obstructive pulmonary disease, unspecified: Secondary | ICD-10-CM | POA: Diagnosis not present

## 2015-03-14 DIAGNOSIS — F329 Major depressive disorder, single episode, unspecified: Secondary | ICD-10-CM | POA: Diagnosis not present

## 2015-03-16 DIAGNOSIS — F329 Major depressive disorder, single episode, unspecified: Secondary | ICD-10-CM | POA: Diagnosis not present

## 2015-03-16 DIAGNOSIS — I10 Essential (primary) hypertension: Secondary | ICD-10-CM | POA: Diagnosis not present

## 2015-03-16 DIAGNOSIS — S72112D Displaced fracture of greater trochanter of left femur, subsequent encounter for closed fracture with routine healing: Secondary | ICD-10-CM | POA: Diagnosis not present

## 2015-03-16 DIAGNOSIS — Z720 Tobacco use: Secondary | ICD-10-CM | POA: Diagnosis not present

## 2015-03-16 DIAGNOSIS — M6281 Muscle weakness (generalized): Secondary | ICD-10-CM | POA: Diagnosis not present

## 2015-03-16 DIAGNOSIS — S52532D Colles' fracture of left radius, subsequent encounter for closed fracture with routine healing: Secondary | ICD-10-CM | POA: Diagnosis not present

## 2015-03-16 DIAGNOSIS — J449 Chronic obstructive pulmonary disease, unspecified: Secondary | ICD-10-CM | POA: Diagnosis not present

## 2015-03-16 DIAGNOSIS — S32028D Other fracture of second lumbar vertebra, subsequent encounter for fracture with routine healing: Secondary | ICD-10-CM | POA: Diagnosis not present

## 2015-03-21 ENCOUNTER — Telehealth: Payer: Self-pay | Admitting: Internal Medicine

## 2015-03-21 ENCOUNTER — Other Ambulatory Visit: Payer: Self-pay | Admitting: Internal Medicine

## 2015-03-21 ENCOUNTER — Encounter: Payer: Self-pay | Admitting: Internal Medicine

## 2015-03-21 NOTE — Assessment & Plan Note (Signed)
Finances problem with her inhalers.  Plan- Want to see how well she can manage using mostly her nebulizer machine. Ok to try off Advair and Spiriva.  Prevnar 13 discussed and given

## 2015-03-21 NOTE — Telephone Encounter (Signed)
Please see if TP can work her in tomorowpp

## 2015-03-21 NOTE — Telephone Encounter (Signed)
Spoke with pt's daughter, [name]. States that pt is having DOE and back pain. Cough is present at times but is dry and makes back pain worse. Denies chest tightness or wheezing. Pt does not want to move due to the pain her back. [name] would like CY's recommendations.  Allergies  Allergen Reactions  . Paxil [Paroxetine Hcl] Other (See Comments)    UNKNOWN   Current Outpatient Prescriptions on File Prior to Visit  Medication Sig Dispense Refill  . albuterol (PROVENTIL HFA;VENTOLIN HFA) 108 (90 BASE) MCG/ACT inhaler 1 to 2 inhalations every 4 hours as needed for Rescue Asthma 1 Inhaler 99  . aspirin 81 MG tablet Take 81 mg by mouth daily.    Marland Kitchen atorvastatin (LIPITOR) 80 MG tablet Take 80 mg by mouth 2 (two) times a week. WED AND SAT    . citalopram (CELEXA) 40 MG tablet TAKE 1 TABLET BY MOUTH DAILY FOR MOOD 90 tablet 2  . dextromethorphan-guaiFENesin (MUCINEX DM) 30-600 MG per 12 hr tablet Take 1 tablet by mouth 2 (two) times daily. 10 tablet 0  . doxazosin (CARDURA) 8 MG tablet TAKE 1 TABLET AT BEDTIME 90 tablet 0  . ipratropium-albuterol (DUONEB) 0.5-2.5 (3) MG/3ML SOLN Take 3 mLs by nebulization 4 (four) times daily. 360 mL 99  . losartan (COZAAR) 100 MG tablet Take 100 mg by mouth daily.    . Melatonin 5 MG CAPS Take 5 mg by mouth at bedtime.    Marland Kitchen oxyCODONE-acetaminophen (PERCOCET/ROXICET) 5-325 MG per tablet Take two tablets by mouth every four hours as needed for pain. Do not exceed 4gms of Tylenol in 24 hours 360 tablet 0  . predniSONE (DELTASONE) 5 MG tablet Take 1 tablet (5 mg total) by mouth 2 (two) times daily with a meal. 100 tablet 5  . Vitamin D, Ergocalciferol, (DRISDOL) 50000 UNITS CAPS capsule TAKE ONE CAPSULE EVERY DAY 30 capsule 2   No current facility-administered medications on file prior to visit.   CY - please advise. Thanks.

## 2015-03-21 NOTE — Assessment & Plan Note (Signed)
We try to build her commitment to stop smoking- advised support available

## 2015-03-21 NOTE — Telephone Encounter (Signed)
Called and spoke to pt's daughter. Appt made with TP on 7.19.16 at 1100. Pt's daughter verbalized understanding and denied any further questions or concerns at this time.

## 2015-03-21 NOTE — Assessment & Plan Note (Signed)
Ongoing f/u with Oncology

## 2015-03-22 ENCOUNTER — Inpatient Hospital Stay: Admission: RE | Admit: 2015-03-22 | Payer: Medicare Other | Source: Ambulatory Visit

## 2015-03-22 ENCOUNTER — Encounter: Payer: Self-pay | Admitting: Adult Health

## 2015-03-22 ENCOUNTER — Ambulatory Visit (INDEPENDENT_AMBULATORY_CARE_PROVIDER_SITE_OTHER)
Admission: RE | Admit: 2015-03-22 | Discharge: 2015-03-22 | Disposition: A | Discharge status: Home / Self Care | Payer: Medicare Other | Source: Ambulatory Visit | Attending: Adult Health | Admitting: Adult Health

## 2015-03-22 ENCOUNTER — Ambulatory Visit (INDEPENDENT_AMBULATORY_CARE_PROVIDER_SITE_OTHER): Payer: Medicare Other | Admitting: Adult Health

## 2015-03-22 VITALS — BP 138/72 | HR 87 | Temp 98.4°F | Ht 64.0 in | Wt 149.0 lb

## 2015-03-22 DIAGNOSIS — J9601 Acute respiratory failure with hypoxia: Secondary | ICD-10-CM | POA: Diagnosis not present

## 2015-03-22 DIAGNOSIS — R0600 Dyspnea, unspecified: Secondary | ICD-10-CM | POA: Diagnosis not present

## 2015-03-22 DIAGNOSIS — J449 Chronic obstructive pulmonary disease, unspecified: Secondary | ICD-10-CM

## 2015-03-22 NOTE — Patient Instructions (Signed)
Increase Prednisone '5mg'$  2 Twice daily  For 1 week.  Set up CT chest -PE protocol  Mucinex DM Twice daily  As needed  Cough/congestioin  follow up Dr. Annamaria Boots  In 3-4 weeks and As needed   Please contact office for sooner follow up if symptoms do not improve or worsen or seek emergency care

## 2015-03-22 NOTE — Progress Notes (Signed)
Subjective:    Patient ID: Hailey Keith, female    DOB: May 15, 1935, 80 y.o.   MRN: [id number]  HPI 80 yo female with COPD , Hypoxic Resp Failure on oxygen  At bedtime  And As needed  , chronic steroids '5mg'$  Twice daily   Lung cancer -stage IIB non-small cell lung cancer, squamous cell carcinoma status post curative radiotherapy to the left upper lobe lung mass with partial response  03/22/2015 Acute OV : COPD -Parrett NP  Pt presents for an acute office visit.  Complains of 10 days of cough, congestion and increased dyspnea with back pain.  Back pain between scapula, rating back pain a 3/10. Pt c/o increase in SOB with any acitivty, mild prod cough with creamy colored mucus x 1 week. Pt denies CP/tightness. Took Levaquin for 7 days without change.  No hemoptysis , chest pain, orthopnea, edema or fever.   O2 was decreased with walking in office . Drops to 86% on RA.  Has O2 with her but not wearing it. Wears it at night and As needed  .   Had fall in April with left Hip and wrist x in April.  Compression fx of L2-age inderminate.   Went to rehab briefly. Doing well  Denies calf pain or swelling.  Squamous cell lung cancer dx 04/2013 s/p XRT  Last CT chest 11/2014 with regression of LUL nodule.  New nodule in LLL .  ILD changes -slightly progressive,  Has planned CT in 05/2015 .    Review of Systems Constitutional:   No  weight loss, night sweats,  Fevers, chills,  +fatigue, or  lassitude.  HEENT:   No headaches,  Difficulty swallowing,  Tooth/dental problems, or  Sore throat,                No sneezing, itching, ear ache, nasal congestion, post nasal drip,   CV:  No chest pain,  Orthopnea, PND, swelling in lower extremities, anasarca, dizziness, palpitations, syncope.   GI  No heartburn, indigestion, abdominal pain, nausea, vomiting, diarrhea, change in bowel habits, loss of appetite, bloody stools.   Resp:  No excess mucus, no productive cough,  No non-productive cough,  No  coughing up of blood.  No change in color of mucus.  No wheezing.  No chest wall deformity  Skin: no rash or lesions.  GU: no dysuria, change in color of urine, no urgency or frequency.  No flank pain, no hematuria   MS:  No joint pain or swelling.  No decreased range of motion.  No back pain.  Psych:  No change in mood or affect. No depression or anxiety.  No memory loss.        Objective:   Physical Exam GEN: A/Ox3; pleasant , NAD, elderly and frail   HEENT:  /AT,  EACs-clear, TMs-wnl, NOSE-clear, THROAT-clear, no lesions, no postnasal drip or exudate noted.   NECK:  Supple w/ fair ROM; no JVD; normal carotid impulses w/o bruits; no thyromegaly or nodules palpated; no lymphadenopathy.  RESP  Decreased BS in bases , no accessory muscle use, no dullness to percussion  CARD:  RRR, no m/r/g  , no peripheral edema, pulses intact, no cyanosis or clubbing. Neg calf pain, neg homans sign   GI:   Soft & nt; nml bowel sounds; no organomegaly or masses detected.  Musco: Warm bil, no deformities or joint swelling noted.   Neuro: alert, no focal deficits noted.    Skin: Warm, no lesions or rashes  Assessment & Plan:

## 2015-03-22 NOTE — Assessment & Plan Note (Signed)
?  resolving flare  CXR today shows no acute process.   Plan  Increase Prednisone '5mg'$  2 Twice daily  For 1 week.   Mucinex DM Twice daily  As needed  Cough/congestioin  follow up Dr. Annamaria Boots  In 3-4 weeks and As needed   Please contact office for sooner follow up if symptoms do not improve or worsen or seek emergency care

## 2015-03-22 NOTE — Assessment & Plan Note (Signed)
Worsening Dyspnea/DOE ,  Advised to wear Oxygen with activity  Check CT chest -PE protocol as recent hip fx and hx of lung cancer to r/o PE  ?back pain may be due to additonal compression fx or chronic back isseus  If CT neg would refer to PCP for back pain issues.   Late add , pt did not get CT done as planned , will have done tomorrow . Pt rescheduled as needed labs prior.  Please contact office for sooner follow up if symptoms do not improve or worsen or seek emergency care

## 2015-03-23 ENCOUNTER — Ambulatory Visit (INDEPENDENT_AMBULATORY_CARE_PROVIDER_SITE_OTHER)
Admission: RE | Admit: 2015-03-23 | Discharge: 2015-03-23 | Disposition: A | Discharge status: Home / Self Care | Payer: Medicare Other | Source: Ambulatory Visit | Attending: Adult Health | Admitting: Adult Health

## 2015-03-23 ENCOUNTER — Telehealth: Payer: Self-pay | Admitting: Internal Medicine

## 2015-03-23 ENCOUNTER — Other Ambulatory Visit (INDEPENDENT_AMBULATORY_CARE_PROVIDER_SITE_OTHER): Payer: Medicare Other

## 2015-03-23 DIAGNOSIS — R918 Other nonspecific abnormal finding of lung field: Secondary | ICD-10-CM | POA: Diagnosis not present

## 2015-03-23 DIAGNOSIS — R0600 Dyspnea, unspecified: Secondary | ICD-10-CM | POA: Diagnosis not present

## 2015-03-23 DIAGNOSIS — C3490 Malignant neoplasm of unspecified part of unspecified bronchus or lung: Secondary | ICD-10-CM | POA: Diagnosis not present

## 2015-03-23 LAB — BASIC METABOLIC PANEL
BUN: 11 mg/dL (ref 6–23)
CHLORIDE: 100 meq/L (ref 96–112)
CO2: 34 mEq/L — ABNORMAL HIGH (ref 19–32)
CREATININE: 0.7 mg/dL (ref 0.40–1.20)
Calcium: 9.3 mg/dL (ref 8.4–10.5)
GFR: 86.01 mL/min (ref >60.00)
GLUCOSE: 101 mg/dL — AB (ref 70–99)
POTASSIUM: 3.7 meq/L (ref 3.5–5.1)
Sodium: 139 mEq/L (ref 135–145)

## 2015-03-23 MED ORDER — IOHEXOL 350 MG/ML SOLN
80.0000 mL | Freq: Once | INTRAVENOUS | Status: AC | PRN
  Administered 2015-03-23: 80 mL via INTRAVENOUS

## 2015-03-23 NOTE — Telephone Encounter (Signed)
error 

## 2015-03-24 NOTE — Progress Notes (Signed)
Quick Note:  Called and spoke to pt's daughter, [name]. Reiterated the results per TP. Pt has a PCP, Dr. Melford Aase, as their office is currently closed I will call Dr. Idell Pickles office on 7.22.16 to schedule visit for pt. Regina verbalized understanding. ______

## 2015-03-25 NOTE — Progress Notes (Signed)
Quick Note:  ATC pt's daughter, [name]. Unable to leave VM. WCB. Called Dr. Idell Pickles office [phone]) and scheduled pt an appt for Monday 7.25.16 at 4:00pm. Will need to inform pt's daughter. ______

## 2015-03-25 NOTE — Progress Notes (Signed)
Quick Note:  Called and spoke to pt's daughter, [name]. Informed her of the appt on Monday 7.25.16 with Dr. Melford Aase, PCP. CT results sent to PCP. [name] verbalized understanding and denied any further questions or concerns at this time. ______

## 2015-03-28 ENCOUNTER — Ambulatory Visit (INDEPENDENT_AMBULATORY_CARE_PROVIDER_SITE_OTHER): Payer: Medicare Other | Admitting: Physician Assistant

## 2015-03-28 ENCOUNTER — Encounter: Payer: Self-pay | Admitting: Physician Assistant

## 2015-03-28 VITALS — BP 122/68 | HR 82 | Temp 98.4°F | Resp 18

## 2015-03-28 DIAGNOSIS — M4854XS Collapsed vertebra, not elsewhere classified, thoracic region, sequela of fracture: Secondary | ICD-10-CM | POA: Diagnosis not present

## 2015-03-28 MED ORDER — PREDNISONE 20 MG PO TABS: ORAL_TABLET | ORAL | Status: AC

## 2015-03-28 MED ORDER — OXYCODONE-ACETAMINOPHEN 5-325 MG PO TABS: ORAL_TABLET | ORAL | Status: DC

## 2015-03-28 NOTE — Patient Instructions (Signed)
Vertebral Fracture You have a fracture of one or more vertebra. These are the bony parts that form the spine. Minor vertebral fractures happen when people fall. Osteoporosis is associated with many of these fractures. Hospital care may not be necessary for minor compression fractures that are stable. However, multiple fractures of the spine or unstable injuries can cause severe pain and even damage the spinal cord. A spinal cord injury may cause paralysis, numbness, or loss of normal bowel and bladder control.  Normally there is pain and stiffness in the back for 3 to 6 weeks after a vertebral fracture. Bed rest for several days, pain medicine, and a slow return to activity is often the only treatment that is needed depending on the location of the fracture. Neck and back braces may be helpful in reducing pain and increasing mobility. When your pain allows, you should begin walking or swimming to help maintain your endurance. Exercises to improve motion and to strengthen the back may also be useful after the initial pain improves. Treatment for osteoporosis may be essential for full recovery. This will help reduce your risk of vertebral fractures with a future fall. During the first few days after a spine fracture you may feel nauseated or vomit. If this is severe, hospital care with IV fluids will be needed.  Arrange for follow-up care as recommended to assure proper long-term care and prevention of further spine injury.  SEEK IMMEDIATE MEDICAL CARE IF:  You have increasing pain, vomiting, or are unable to move around at all.  You develop numbness, tingling, weakness, or paralysis of any part of your body.  You develop a loss of normal bowel or bladder control.  You have difficulty breathing, cough, fever, chest or abdominal pain. MAKE SURE YOU:   Understand these instructions.  Will watch your condition.  Will get help right away if you are not doing well or get worse. Document Released:  09/27/2004 Document Revised: 11/12/2011 Document Reviewed: 04/12/2009 [facility] Patient Information 2015 [place], [place]. This information is not intended to replace advice given to you by your health care provider. Make sure you discuss any questions you have with your health care provider.   Back, Compression Fracture A compression fracture happens when a force is put upon the length of your spine. Slipping and falling on your bottom are examples of such a force. When this happens, sometimes the force is great enough to compress the building blocks (vertebral bodies) of your spine. Although this causes a lot of pain, this can usually be treated at home, unless your caregiver feels hospitalization is needed for pain control. Your backbone (spinal column) is made up of 24 main vertebral bodies in addition to the sacrum and coccyx (see illustration). These are held together by tough fibrous tissues (ligaments) and by support of your muscles. Nerve roots pass through the openings between the vertebrae. A sudden wrenching move, injury, or a fall may cause a compression fracture of one of the vertebral bodies. This may result in back pain or spread of pain into the belly (abdomen), the buttocks, and down the leg into the foot. Pain may also be created by muscle spasm alone. Large studies have been undertaken to determine the best possible course of action to help your back following injury and also to prevent future problems. The recommendations are as follows. FOLLOWING A COMPRESSION FRACTURE: Do the following only if advised by your caregiver.   If a back brace has been suggested or provided, wear it as  directed.  Do not stop wearing the back brace unless instructed by your caregiver.  When allowed to return to regular activities, avoid a sedentary lifestyle. Actively exercise. Sporadic weekend binges of tennis, racquetball, or waterskiing may actually aggravate or create problems, especially if you are  not in condition for that activity.  Avoid sports requiring sudden body movements until you are in condition for them. Swimming and walking are safer activities.  Maintain good posture.  Avoid obesity.  If not already done, you should have a DEXA scan. Based on the results, be treated for osteoporosis. FOLLOWING ACUTE (SUDDEN) INJURY:  Only take over-the-counter or prescription medicines for pain, discomfort, or fever as directed by your caregiver.  Use bed rest for only the most extreme acute episode. Prolonged bed rest may aggravate your condition. Ice used for acute conditions is effective. Use a large plastic bag filled with ice. Wrap it in a towel. This also provides excellent pain relief. This may be continuous. Or use it for 30 minutes every 2 hours during acute phase, then as needed. Heat for 30 minutes prior to activities is helpful.  As soon as the acute phase (the time when your back is too painful for you to do normal activities) is over, it is important to resume normal activities and work [employer] information [employer]. Back injuries can cause potentially marked changes in lifestyle. So it is important to attack these problems aggressively.  See your caregiver for continued problems. He or she can help or refer you for appropriate exercises, physical therapy, and work hardening if needed.  If you are given narcotic medications for your condition, for the next 24 hours do not:  Drive.  Operate machinery or power tools.  Sign legal documents.  Do not drink alcohol, or take sleeping pills or other medications that may interfere with treatment. If your caregiver has given you a follow-up appointment, it is very important to keep that appointment. Not keeping the appointment could result in a chronic or permanent injury, pain, and disability. If there is any problem keeping the appointment, you must call back to this facility for assistance.  SEEK IMMEDIATE MEDICAL CARE IF:  You develop  numbness, tingling, weakness, or problems with the use of your arms or legs.  You develop severe back pain not relieved with medications.  You have changes in bowel or bladder control.  You have increasing pain in any areas of the body. Document Released: 08/20/2005 Document Revised: 01/04/2014 Document Reviewed: 03/24/2008 [facility] Patient Information 2015 [place], [place]. This information is not intended to replace advice given to you by your health care provider. Make sure you discuss any questions you have with your health care provider.

## 2015-03-28 NOTE — Progress Notes (Signed)
Subjective:    Patient ID: Hailey Keith, female    DOB: 06-26-1935, 80 y.o.   MRN: [id number]  HPI 80 y.o. female with severe end stage COPD, s/p lung cancer, HTN, chol, preDM, vitamin D, high fall risk. She has been having thoracic back pain x 3 weeks with shortness of breath. So she was taken to her pulmonary doctor, complains of SOB and upper back pain, CXR was negative, and CT chest ruled out PE but showed new compression fracture of T5 wedge. When she had her fall in April, she was only on 1 oxycodone twice a day but then with her back pain, she is getting 1 pill every 4 hours of oxycodone 5/325. Not a surgical canidate. Has seen Dr. Nelva Bush in the past for pain management. No chest pain, no dizziness.   Back pain hurts all the time, worse with moving, change in position, sitting up straight, deep breaths, can radiate around her entire body.   Blood pressure 122/68, pulse 82, temperature 98.4 F (36.9 C), temperature source Temporal, resp. rate 18.  Current Outpatient Prescriptions on File Prior to Visit  Medication Sig Dispense Refill  . albuterol (PROVENTIL HFA;VENTOLIN HFA) 108 (90 BASE) MCG/ACT inhaler 1 to 2 inhalations every 4 hours as needed for Rescue Asthma 1 Inhaler 99  . aspirin 81 MG tablet Take 81 mg by mouth daily.    Marland Kitchen atorvastatin (LIPITOR) 80 MG tablet Take 80 mg by mouth 2 (two) times a week. WED AND SAT    . citalopram (CELEXA) 40 MG tablet TAKE 1 TABLET BY MOUTH DAILY FOR MOOD 90 tablet 2  . dextromethorphan-guaiFENesin (MUCINEX DM) 30-600 MG per 12 hr tablet Take 1 tablet by mouth 2 (two) times daily. 10 tablet 0  . doxazosin (CARDURA) 8 MG tablet TAKE 1 TABLET AT BEDTIME 90 tablet 0  . ipratropium-albuterol (DUONEB) 0.5-2.5 (3) MG/3ML SOLN Take 3 mLs by nebulization 4 (four) times daily. 360 mL 99  . losartan (COZAAR) 100 MG tablet Take 100 mg by mouth daily.    Marland Kitchen oxyCODONE-acetaminophen (PERCOCET/ROXICET) 5-325 MG per tablet Take two tablets by mouth every four  hours as needed for pain. Do not exceed 4gms of Tylenol in 24 hours 360 tablet 0  . predniSONE (DELTASONE) 5 MG tablet Take 1 tablet (5 mg total) by mouth 2 (two) times daily with a meal. 100 tablet 5  . Vitamin D, Ergocalciferol, (DRISDOL) 50000 UNITS CAPS capsule TAKE ONE CAPSULE EVERY DAY (Patient taking differently: TAKE ONE CAPSULE TWICE PER WEEK) 30 capsule 2   No current facility-administered medications on file prior to visit.   Past Medical History  Diagnosis Date  . Hypertension   . Dyslipidemia   . Hard of hearing   . Hx of radiation therapy 06/18/13- 07/23/13    LUL lung primary, 7020 cGy 26 sessions  . 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 Skin cancer     basal cell  . COPD (chronic obstructive pulmonary disease)   . prediabetes    Review of Systems  Constitutional: Positive for fatigue. Negative for fever, chills, diaphoresis, activity change, appetite change and unexpected weight change.  HENT: Negative.   Respiratory: Positive for cough and shortness of breath. Negative for apnea, choking, chest tightness, wheezing and stridor.   Cardiovascular: Negative for chest pain, palpitations and leg swelling.  Gastrointestinal: Negative.   Genitourinary: Negative.   Musculoskeletal: Positive for back pain and gait problem. Negative for myalgias, joint swelling, arthralgias, neck  pain and neck stiffness.  Skin: Negative for rash.  Neurological: Negative.  Negative for dizziness.       Objective:   Physical Exam  Constitutional: She is oriented to person, place, and time. She appears well-developed and well-nourished.  HENT:  Head: Normocephalic and atraumatic.  Eyes: Conjunctivae are normal. Pupils are equal, round, and reactive to light.  Neck: Normal range of motion. Neck supple.  Cardiovascular: Normal rate and regular rhythm.   Pulmonary/Chest: Effort normal. She has wheezes.  Diffuse decreased breath sounds, has O2 via [place]  Abdominal: Soft. There is no  tenderness.  Musculoskeletal: She exhibits tenderness (thoracic back, decreased ROM due to pain. ).  In wheel chair due to pain/breathing  Lymphadenopathy:    She has no cervical adenopathy.  Neurological: She is alert and oriented to person, place, and time. No cranial nerve deficit.  Skin: Skin is warm and dry. No rash (no rash on back/chest) noted.       Assessment & Plan:  Back pain from new compression fracture-  No CP, + SOB but has COPD and had negative CT chest, very mechanical pain.  Last DEXA was 2014 will get new one Will refer to Dr. Nelva Bush, will refill short term oxycodone, and will increase presdnisone and do a taper  Future Appointments Date Time Provider [place]  04/04/2015 4:00 PM Unk Pinto, MD [facility] None  04/13/2015 11:15 AM Deneise Lever, MD [facility] None  05/04/2015 10:00 AM Starlyn Skeans, PA-C [facility] None  05/05/2015 10:45 AM [facility] ONLY [facility] None  05/05/2015 11:30 AM [facility] 2 [facility] [place]  05/11/2015 10:30 AM Curt Bears, MD [facility] None  06/27/2015 10:00 AM Unk Pinto, MD [facility] None

## 2015-03-30 DIAGNOSIS — J449 Chronic obstructive pulmonary disease, unspecified: Secondary | ICD-10-CM | POA: Diagnosis not present

## 2015-04-04 ENCOUNTER — Ambulatory Visit: Payer: Self-pay | Admitting: Internal Medicine

## 2015-04-07 ENCOUNTER — Encounter: Payer: Self-pay | Admitting: Internal Medicine

## 2015-04-07 DIAGNOSIS — S22000A Wedge compression fracture of unspecified thoracic vertebra, initial encounter for closed fracture: Secondary | ICD-10-CM | POA: Diagnosis not present

## 2015-04-13 ENCOUNTER — Ambulatory Visit: Payer: Medicare Other | Admitting: Internal Medicine

## 2015-04-18 ENCOUNTER — Other Ambulatory Visit: Payer: Self-pay | Admitting: Internal Medicine

## 2015-04-19 DIAGNOSIS — M5134 Other intervertebral disc degeneration, thoracic region: Secondary | ICD-10-CM | POA: Diagnosis not present

## 2015-04-30 DIAGNOSIS — J449 Chronic obstructive pulmonary disease, unspecified: Secondary | ICD-10-CM | POA: Diagnosis not present

## 2015-05-01 ENCOUNTER — Inpatient Hospital Stay (HOSPITAL_COMMUNITY)
Admission: EM | Admit: 2015-05-01 | Discharge: 2015-05-06 | DRG: 177 | Disposition: A | Discharge status: Home Health | Payer: Medicare Other | Attending: Internal Medicine | Admitting: Internal Medicine

## 2015-05-01 ENCOUNTER — Emergency Department (HOSPITAL_COMMUNITY): Payer: Medicare Other

## 2015-05-01 ENCOUNTER — Encounter (HOSPITAL_COMMUNITY): Payer: Self-pay | Admitting: Emergency Medicine

## 2015-05-01 DIAGNOSIS — Z66 Do not resuscitate: Secondary | ICD-10-CM | POA: Diagnosis present

## 2015-05-01 DIAGNOSIS — Z515 Encounter for palliative care: Secondary | ICD-10-CM

## 2015-05-01 DIAGNOSIS — G9340 Encephalopathy, unspecified: Secondary | ICD-10-CM | POA: Diagnosis present

## 2015-05-01 DIAGNOSIS — E782 Mixed hyperlipidemia: Secondary | ICD-10-CM | POA: Diagnosis present

## 2015-05-01 DIAGNOSIS — E860 Dehydration: Secondary | ICD-10-CM | POA: Diagnosis present

## 2015-05-01 DIAGNOSIS — Z79899 Other long term (current) drug therapy: Secondary | ICD-10-CM

## 2015-05-01 DIAGNOSIS — Z7982 Long term (current) use of aspirin: Secondary | ICD-10-CM | POA: Diagnosis not present

## 2015-05-01 DIAGNOSIS — Z9981 Dependence on supplemental oxygen: Secondary | ICD-10-CM

## 2015-05-01 DIAGNOSIS — R0602 Shortness of breath: Secondary | ICD-10-CM | POA: Diagnosis not present

## 2015-05-01 DIAGNOSIS — J690 Pneumonitis due to inhalation of food and vomit: Principal | ICD-10-CM | POA: Diagnosis present

## 2015-05-01 DIAGNOSIS — M546 Pain in thoracic spine: Secondary | ICD-10-CM | POA: Diagnosis not present

## 2015-05-01 DIAGNOSIS — F0390 Unspecified dementia without behavioral disturbance: Secondary | ICD-10-CM

## 2015-05-01 DIAGNOSIS — F17210 Nicotine dependence, cigarettes, uncomplicated: Secondary | ICD-10-CM | POA: Diagnosis not present

## 2015-05-01 DIAGNOSIS — Z8249 Family history of ischemic heart disease and other diseases of the circulatory system: Secondary | ICD-10-CM | POA: Diagnosis not present

## 2015-05-01 DIAGNOSIS — M4854XS Collapsed vertebra, not elsewhere classified, thoracic region, sequela of fracture: Secondary | ICD-10-CM

## 2015-05-01 DIAGNOSIS — Z79891 Long term (current) use of opiate analgesic: Secondary | ICD-10-CM | POA: Diagnosis not present

## 2015-05-01 DIAGNOSIS — J189 Pneumonia, unspecified organism: Secondary | ICD-10-CM | POA: Diagnosis not present

## 2015-05-01 DIAGNOSIS — J441 Chronic obstructive pulmonary disease with (acute) exacerbation: Secondary | ICD-10-CM | POA: Diagnosis not present

## 2015-05-01 DIAGNOSIS — J449 Chronic obstructive pulmonary disease, unspecified: Secondary | ICD-10-CM

## 2015-05-01 DIAGNOSIS — Z888 Allergy status to other drugs, medicaments and biological substances status: Secondary | ICD-10-CM

## 2015-05-01 DIAGNOSIS — Z923 Personal history of irradiation: Secondary | ICD-10-CM

## 2015-05-01 DIAGNOSIS — Z85118 Personal history of other malignant neoplasm of bronchus and lung: Secondary | ICD-10-CM | POA: Diagnosis not present

## 2015-05-01 DIAGNOSIS — D638 Anemia in other chronic diseases classified elsewhere: Secondary | ICD-10-CM | POA: Diagnosis present

## 2015-05-01 DIAGNOSIS — E871 Hypo-osmolality and hyponatremia: Secondary | ICD-10-CM | POA: Diagnosis not present

## 2015-05-01 DIAGNOSIS — J984 Other disorders of lung: Secondary | ICD-10-CM | POA: Diagnosis not present

## 2015-05-01 DIAGNOSIS — I10 Essential (primary) hypertension: Secondary | ICD-10-CM | POA: Diagnosis not present

## 2015-05-01 DIAGNOSIS — R630 Anorexia: Secondary | ICD-10-CM

## 2015-05-01 DIAGNOSIS — IMO0002 Reserved for concepts with insufficient information to code with codable children: Secondary | ICD-10-CM

## 2015-05-01 DIAGNOSIS — J13 Pneumonia due to Streptococcus pneumoniae: Secondary | ICD-10-CM | POA: Diagnosis not present

## 2015-05-01 DIAGNOSIS — A419 Sepsis, unspecified organism: Secondary | ICD-10-CM | POA: Diagnosis not present

## 2015-05-01 DIAGNOSIS — M4854XA Collapsed vertebra, not elsewhere classified, thoracic region, initial encounter for fracture: Secondary | ICD-10-CM | POA: Diagnosis present

## 2015-05-01 DIAGNOSIS — M545 Low back pain: Secondary | ICD-10-CM | POA: Diagnosis present

## 2015-05-01 DIAGNOSIS — F17200 Nicotine dependence, unspecified, uncomplicated: Secondary | ICD-10-CM | POA: Diagnosis present

## 2015-05-01 DIAGNOSIS — F419 Anxiety disorder, unspecified: Secondary | ICD-10-CM | POA: Diagnosis present

## 2015-05-01 DIAGNOSIS — Z7952 Long term (current) use of systemic steroids: Secondary | ICD-10-CM

## 2015-05-01 DIAGNOSIS — Z85828 Personal history of other malignant neoplasm of skin: Secondary | ICD-10-CM | POA: Diagnosis not present

## 2015-05-01 DIAGNOSIS — M4856XA Collapsed vertebra, not elsewhere classified, lumbar region, initial encounter for fracture: Secondary | ICD-10-CM | POA: Diagnosis present

## 2015-05-01 DIAGNOSIS — R7309 Other abnormal glucose: Secondary | ICD-10-CM | POA: Diagnosis not present

## 2015-05-01 DIAGNOSIS — H9190 Unspecified hearing loss, unspecified ear: Secondary | ICD-10-CM | POA: Diagnosis present

## 2015-05-01 DIAGNOSIS — N179 Acute kidney failure, unspecified: Secondary | ICD-10-CM | POA: Diagnosis present

## 2015-05-01 DIAGNOSIS — J9621 Acute and chronic respiratory failure with hypoxia: Secondary | ICD-10-CM | POA: Diagnosis not present

## 2015-05-01 DIAGNOSIS — J9622 Acute and chronic respiratory failure with hypercapnia: Secondary | ICD-10-CM | POA: Diagnosis not present

## 2015-05-01 DIAGNOSIS — S22059A Unspecified fracture of T5-T6 vertebra, initial encounter for closed fracture: Secondary | ICD-10-CM | POA: Diagnosis not present

## 2015-05-01 DIAGNOSIS — Z7189 Other specified counseling: Secondary | ICD-10-CM | POA: Diagnosis not present

## 2015-05-01 DIAGNOSIS — M159 Polyosteoarthritis, unspecified: Secondary | ICD-10-CM

## 2015-05-01 DIAGNOSIS — F329 Major depressive disorder, single episode, unspecified: Secondary | ICD-10-CM | POA: Diagnosis present

## 2015-05-01 DIAGNOSIS — R7303 Prediabetes: Secondary | ICD-10-CM | POA: Diagnosis present

## 2015-05-01 DIAGNOSIS — M150 Primary generalized (osteo)arthritis: Secondary | ICD-10-CM

## 2015-05-01 DIAGNOSIS — M5126 Other intervertebral disc displacement, lumbar region: Secondary | ICD-10-CM | POA: Diagnosis not present

## 2015-05-01 DIAGNOSIS — Z801 Family history of malignant neoplasm of trachea, bronchus and lung: Secondary | ICD-10-CM | POA: Diagnosis not present

## 2015-05-01 DIAGNOSIS — M549 Dorsalgia, unspecified: Secondary | ICD-10-CM

## 2015-05-01 DIAGNOSIS — R0902 Hypoxemia: Secondary | ICD-10-CM | POA: Diagnosis not present

## 2015-05-01 LAB — COMPREHENSIVE METABOLIC PANEL
ALBUMIN: 2.5 g/dL — AB (ref 3.5–5.0)
ALT: 24 U/L (ref 14–54)
AST: 30 U/L (ref 15–41)
Alkaline Phosphatase: 72 U/L (ref 38–126)
Anion gap: 4 — ABNORMAL LOW (ref 5–15)
BUN: 62 mg/dL — ABNORMAL HIGH (ref 6–20)
CO2: 37 mmol/L — AB (ref 22–32)
Calcium: 9.9 mg/dL (ref 8.9–10.3)
Chloride: 93 mmol/L — ABNORMAL LOW (ref 101–111)
Creatinine, Ser: 1.32 mg/dL — ABNORMAL HIGH (ref 0.44–1.00)
GFR calc non Af Amer: 38 mL/min — ABNORMAL LOW (ref >60)
GFR, EST AFRICAN AMERICAN: 44 mL/min — AB (ref >60)
GLUCOSE: 80 mg/dL (ref 65–99)
POTASSIUM: 4.8 mmol/L (ref 3.5–5.1)
SODIUM: 134 mmol/L — AB (ref 135–145)
Total Bilirubin: 0.7 mg/dL (ref 0.3–1.2)
Total Protein: 5.6 g/dL — ABNORMAL LOW (ref 6.5–8.1)

## 2015-05-01 LAB — URINALYSIS, ROUTINE W REFLEX MICROSCOPIC
Bilirubin Urine: NEGATIVE
Glucose, UA: NEGATIVE mg/dL
Hgb urine dipstick: NEGATIVE
Ketones, ur: NEGATIVE mg/dL
Nitrite: NEGATIVE
PH: 5.5 (ref 5.0–8.0)
Protein, ur: NEGATIVE mg/dL
Specific Gravity, Urine: 1.015 (ref 1.005–1.030)
UROBILINOGEN UA: 1 mg/dL (ref 0.0–1.0)

## 2015-05-01 LAB — URINE MICROSCOPIC-ADD ON

## 2015-05-01 LAB — CBC
HCT: 37.2 % (ref 36.0–46.0)
Hemoglobin: 11.4 g/dL — ABNORMAL LOW (ref 12.0–15.0)
MCH: 29.4 pg (ref 26.0–34.0)
MCHC: 30.6 g/dL (ref 30.0–36.0)
MCV: 95.9 fL (ref 78.0–100.0)
Platelets: 285 10*3/uL (ref 150–400)
RBC: 3.88 MIL/uL (ref 3.87–5.11)
RDW: 14.5 % (ref 11.5–15.5)
WBC: 13.9 10*3/uL — ABNORMAL HIGH (ref 4.0–10.5)

## 2015-05-01 LAB — I-STAT CG4 LACTIC ACID, ED: LACTIC ACID, VENOUS: 0.5 mmol/L (ref 0.5–2.0)

## 2015-05-01 MED ORDER — DEXTROSE 5 % IV SOLN
500.0000 mg | Freq: Once | INTRAVENOUS | Status: AC
  Administered 2015-05-01: 500 mg via INTRAVENOUS
  Filled 2015-05-01: qty 500

## 2015-05-01 MED ORDER — DOXAZOSIN MESYLATE 4 MG PO TABS
8.0000 mg | ORAL_TABLET | Freq: Every day | ORAL | Status: DC
  Administered 2015-05-01 – 2015-05-05 (×3): 8 mg via ORAL
  Filled 2015-05-01 (×2): qty 2
  Filled 2015-05-01: qty 8

## 2015-05-01 MED ORDER — SODIUM CHLORIDE 0.9 % IV SOLN: INTRAVENOUS | Status: DC

## 2015-05-01 MED ORDER — GUAIFENESIN-DM 100-10 MG/5ML PO SYRP: 5.0000 mL | ORAL_SOLUTION | ORAL | Status: DC | PRN

## 2015-05-01 MED ORDER — DEXTROSE 5 % IV SOLN
1.0000 g | Freq: Once | INTRAVENOUS | Status: AC
  Administered 2015-05-01: 1 g via INTRAVENOUS
  Filled 2015-05-01: qty 10

## 2015-05-01 MED ORDER — ASPIRIN 81 MG PO TABS
81.0000 mg | ORAL_TABLET | Freq: Every day | ORAL | Status: DC
  Administered 2015-05-03 – 2015-05-04 (×2): 81 mg via ORAL
  Filled 2015-05-01 (×7): qty 1

## 2015-05-01 MED ORDER — METHYLPREDNISOLONE SODIUM SUCC 125 MG IJ SOLR: 80.0000 mg | Freq: Once | INTRAMUSCULAR | Status: DC

## 2015-05-01 MED ORDER — SENNOSIDES-DOCUSATE SODIUM 8.6-50 MG PO TABS: 1.0000 | ORAL_TABLET | Freq: Every evening | ORAL | Status: DC | PRN

## 2015-05-01 MED ORDER — ALBUTEROL SULFATE (2.5 MG/3ML) 0.083% IN NEBU
5.0000 mg | INHALATION_SOLUTION | RESPIRATORY_TRACT | Status: AC | PRN
  Administered 2015-05-02: 5 mg via RESPIRATORY_TRACT
  Filled 2015-05-01: qty 6

## 2015-05-01 MED ORDER — ATORVASTATIN CALCIUM 40 MG PO TABS
80.0000 mg | ORAL_TABLET | ORAL | Status: DC
  Administered 2015-05-04: 80 mg via ORAL
  Filled 2015-05-01: qty 2

## 2015-05-01 MED ORDER — SODIUM CHLORIDE 0.9 % IJ SOLN
3.0000 mL | Freq: Two times a day (BID) | INTRAMUSCULAR | Status: DC
  Administered 2015-05-02 – 2015-05-05 (×8): 3 mL via INTRAVENOUS

## 2015-05-01 MED ORDER — LORAZEPAM 2 MG/ML IJ SOLN
0.5000 mg | Freq: Once | INTRAMUSCULAR | Status: AC
  Administered 2015-05-01: 0.5 mg via INTRAVENOUS
  Filled 2015-05-01: qty 1

## 2015-05-01 MED ORDER — SODIUM CHLORIDE 0.9 % IV BOLUS (SEPSIS)
1000.0000 mL | Freq: Once | INTRAVENOUS | Status: AC
  Administered 2015-05-01: 1000 mL via INTRAVENOUS

## 2015-05-01 MED ORDER — ALBUTEROL SULFATE (2.5 MG/3ML) 0.083% IN NEBU
5.0000 mg | INHALATION_SOLUTION | Freq: Once | RESPIRATORY_TRACT | Status: AC
  Administered 2015-05-01: 5 mg via RESPIRATORY_TRACT
  Filled 2015-05-01: qty 6

## 2015-05-01 MED ORDER — HYDROCODONE-ACETAMINOPHEN 5-325 MG PO TABS
1.0000 | ORAL_TABLET | Freq: Four times a day (QID) | ORAL | Status: DC | PRN
  Administered 2015-05-01 – 2015-05-02 (×2): 1 via ORAL
  Filled 2015-05-01 (×2): qty 1

## 2015-05-01 MED ORDER — LOSARTAN POTASSIUM 50 MG PO TABS
100.0000 mg | ORAL_TABLET | Freq: Every day | ORAL | Status: DC
  Administered 2015-05-03 – 2015-05-06 (×4): 100 mg via ORAL
  Filled 2015-05-01 (×5): qty 2

## 2015-05-01 MED ORDER — ALBUTEROL SULFATE (2.5 MG/3ML) 0.083% IN NEBU
2.5000 mg | INHALATION_SOLUTION | RESPIRATORY_TRACT | Status: DC | PRN
  Filled 2015-05-01: qty 3

## 2015-05-01 MED ORDER — SODIUM CHLORIDE 0.9 % IV BOLUS (SEPSIS)
500.0000 mL | Freq: Once | INTRAVENOUS | Status: AC
  Administered 2015-05-01: 500 mL via INTRAVENOUS

## 2015-05-01 MED ORDER — DEXTROSE 5 % IV SOLN
500.0000 mg | INTRAVENOUS | Status: DC
  Administered 2015-05-02 – 2015-05-03 (×2): 500 mg via INTRAVENOUS
  Filled 2015-05-01 (×2): qty 500

## 2015-05-01 MED ORDER — IPRATROPIUM BROMIDE 0.02 % IN SOLN
0.5000 mg | Freq: Once | RESPIRATORY_TRACT | Status: AC
  Administered 2015-05-01: 0.5 mg via RESPIRATORY_TRACT
  Filled 2015-05-01: qty 2.5

## 2015-05-01 MED ORDER — ACETAMINOPHEN 650 MG RE SUPP: 650.0000 mg | Freq: Four times a day (QID) | RECTAL | Status: DC | PRN

## 2015-05-01 MED ORDER — CITALOPRAM HYDROBROMIDE 20 MG PO TABS: 40.0000 mg | ORAL_TABLET | Freq: Every day | ORAL | Status: DC

## 2015-05-01 MED ORDER — GUAIFENESIN ER 600 MG PO TB12
1200.0000 mg | ORAL_TABLET | Freq: Two times a day (BID) | ORAL | Status: DC
  Administered 2015-05-01 – 2015-05-06 (×7): 1200 mg via ORAL
  Filled 2015-05-01 (×8): qty 2

## 2015-05-01 MED ORDER — SODIUM CHLORIDE 0.9 % IV SOLN
INTRAVENOUS | Status: DC
  Administered 2015-05-01 – 2015-05-02 (×2): via INTRAVENOUS

## 2015-05-01 MED ORDER — ENOXAPARIN SODIUM 40 MG/0.4ML ~~LOC~~ SOLN
40.0000 mg | SUBCUTANEOUS | Status: DC
  Administered 2015-05-01 – 2015-05-05 (×5): 40 mg via SUBCUTANEOUS
  Filled 2015-05-01 (×6): qty 0.4

## 2015-05-01 MED ORDER — METHYLPREDNISOLONE SODIUM SUCC 125 MG IJ SOLR
60.0000 mg | Freq: Three times a day (TID) | INTRAMUSCULAR | Status: DC
  Administered 2015-05-02: 60 mg via INTRAVENOUS
  Filled 2015-05-01: qty 2

## 2015-05-01 MED ORDER — DEXTROSE 5 % IV SOLN: 1.0000 g | INTRAVENOUS | Status: DC

## 2015-05-01 MED ORDER — ACETAMINOPHEN 325 MG PO TABS: 650.0000 mg | ORAL_TABLET | Freq: Four times a day (QID) | ORAL | Status: DC | PRN

## 2015-05-01 NOTE — ED Provider Notes (Addendum)
CSN: [id number]     Arrival date & time 05/01/15  1433 History   First MD Initiated Contact with Patient 05/01/15 1459     Chief Complaint  Patient presents with  . Weakness  . Altered Mental Status     (Consider location/radiation/quality/duration/timing/severity/associated sxs/prior Treatment) Patient is a 80 y.o. female presenting with weakness and altered mental status. The history is provided by the patient and a relative. The history is limited by the condition of the patient.  Weakness Associated symptoms include shortness of breath. Pertinent negatives include no chest pain, no abdominal pain and no headaches.  Altered Mental Status Presenting symptoms: confusion   Associated symptoms: weakness   Associated symptoms: no abdominal pain, no fever, no headaches, no rash and no vomiting   Patient w hx dementia, 'stage IV copd', noted by family to have decreased po intake x 4-5 days, and increased generalized weakness.  Pt limited historian, denies pain, fever, or other specific c/o - level 5 caveat - dementia. Family denies recent fevers. No recent trauma or fall. Compliant w normal meds, no recent change in meds, except family tried giving levaquin once a day for the past 4 days (which they had left over).  Family notes increase in non prod cough, and lower than normal sats on o2, and sounding congested. . Uses home o2 3 liters. No other uri c/o. No chest pain. No abd pain. No vomiting or diarrhea. No gu c/o.      Past Medical History  Diagnosis Date  . Hypertension   . Dyslipidemia   . Hard of hearing   . Hx of radiation therapy 06/18/13- 07/23/13    LUL lung primary, 7020 cGy 26 sessions  . 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 Skin cancer     basal cell  . COPD (chronic obstructive pulmonary disease)   . prediabetes    Past Surgical History  Procedure Laterality Date  . Tonsillectomy      as child, repeat surgery early 20's  . Mohs surgery      "several times"   . Facial reconstruction surgery  25 years ago    to remove skin cancer, right side of face   Family History  Problem Relation Age of Onset  . Hypertension    . Lung cancer Sister     tx w/radiation, met to stomach  . Pneumonia Mother     Deceased  . Heart attack Father     Deceased  . Cancer Sister     "back of neck"   Social History  Substance Use Topics  . Smoking status: Current Every Day Smoker -- 0.50 packs/day for 60 years    Types: Cigarettes  . Smokeless tobacco: None     Comment: 06/05/13 currently 1/2 PPD, trying to quit  . Alcohol Use: Yes     Comment: occ wine, 2 drinks nightly   OB History    No data available     Review of Systems  Constitutional: Negative for fever and chills.  HENT: Negative for sore throat.   Eyes: Negative for redness.  Respiratory: Positive for cough and shortness of breath.   Cardiovascular: Negative for chest pain.  Gastrointestinal: Negative for vomiting, abdominal pain and diarrhea.  Genitourinary: Negative for dysuria.  Musculoskeletal: Positive for back pain. Negative for neck pain.       Prior thoracic compression fx - recent steroid inj for same.   Skin: Negative for rash.  Neurological: Positive for  weakness. Negative for numbness and headaches.  Hematological: Does not bruise/bleed easily.  Psychiatric/Behavioral: Positive for confusion.      Allergies  Paxil  Home Medications   Prior to Admission medications   Medication Sig Start Date End Date Taking? Authorizing Provider  aspirin 81 MG tablet Take 81 mg by mouth daily.    Historical Provider, MD  atorvastatin (LIPITOR) 80 MG tablet Take 80 mg by mouth 2 (two) times a week. WED AND SAT    Historical Provider, MD  citalopram (CELEXA) 40 MG tablet TAKE 1 TABLET BY MOUTH DAILY FOR MOOD 12/24/14   Quentin Mulling, PA-C  dextromethorphan-guaiFENesin [facility] DM) 30-600 MG per 12 hr tablet Take 1 tablet by mouth 2 (two) times daily. 12/28/14   Nishant Dhungel, MD   doxazosin (CARDURA) 8 MG tablet TAKE 1 TABLET AT BEDTIME 03/21/15   Courtney Forcucci, PA-C  ipratropium-albuterol (DUONEB) 0.5-2.5 (3) MG/3ML SOLN TAKE 3 MLS BY NEBULIZATION 4 (FOUR) TIMES DAILY. 04/18/15   Quentin Mulling, PA-C  levofloxacin (LEVAQUIN) 500 MG tablet TAKE 1 TABLET (500 MG TOTAL) BY MOUTH DAILY. FOR LUNG INFECTION 04/18/15   Quentin Mulling, PA-C  losartan (COZAAR) 100 MG tablet Take 100 mg by mouth daily.    Historical Provider, MD  oxyCODONE-acetaminophen (PERCOCET/ROXICET) 5-325 MG per tablet Take two tablets by mouth every four hours as needed for pain. Do not exceed 4gms of Tylenol in 24 hours 03/28/15   Quentin Mulling, PA-C  predniSONE (DELTASONE) 5 MG tablet Take 1 tablet (5 mg total) by mouth 2 (two) times daily with a meal. 01/03/15   Nishant Dhungel, MD  PROAIR HFA 108 (90 BASE) MCG/ACT inhaler 1 TO 2 INHALATIONS EVERY 4 HOURS AS NEEDED FOR RESCUE ASTHMA 04/18/15   Quentin Mulling, PA-C  Vitamin D, Ergocalciferol, (DRISDOL) 50000 UNITS CAPS capsule TAKE ONE CAPSULE EVERY DAY Patient taking differently: TAKE ONE CAPSULE TWICE PER WEEK 03/01/15   Quentin Mulling, PA-C   BP 99/63 mmHg  Pulse 78  Temp(Src) 98.8 F (37.1 C) (Oral)  Resp 16  SpO2 90% Physical Exam  Constitutional: She appears well-developed and well-nourished. No distress.  HENT:  Head: Atraumatic.  Mouth/Throat: Oropharynx is clear and moist.  Eyes: Conjunctivae are normal. Pupils are equal, round, and reactive to light. No scleral icterus.  Neck: Neck supple. No tracheal deviation present.  No stiffness or rigidity  Cardiovascular: Normal rate, regular rhythm, normal heart sounds and intact distal pulses.   Pulmonary/Chest: Effort normal. No respiratory distress.  Non prod cough, upper resp congestion, rhonchi right.   Abdominal: Soft. Normal appearance and bowel sounds are normal. She exhibits no distension. There is no tenderness.  Genitourinary:  No cva tenderness  Musculoskeletal: She exhibits no edema  or tenderness.  Neurological: She is alert.  Awake and alert. Speech fluent. Mildly confused. Moves bil ext purposefully w good strength.   Skin: Skin is warm and dry. No rash noted. She is not diaphoretic.  Psychiatric: She has a normal mood and affect.  Nursing note and vitals reviewed.   ED Course  Procedures (including critical care time) Labs Review Results for orders placed or performed during the hospital encounter of 05/01/15  CBC  Result Value Ref Range   WBC 13.9 (H) 4.0 - 10.5 K/uL   RBC 3.88 3.87 - 5.11 MIL/uL   Hemoglobin 11.4 (L) 12.0 - 15.0 g/dL   HCT 37.6 28.3 - 15.1 %   MCV 95.9 78.0 - 100.0 fL   MCH 29.4 26.0 - 34.0 pg  MCHC 30.6 30.0 - 36.0 g/dL   RDW 14.5 11.5 - 15.5 %   Platelets 285 150 - 400 K/uL  Comprehensive metabolic panel  Result Value Ref Range   Sodium 134 (L) 135 - 145 mmol/L   Potassium 4.8 3.5 - 5.1 mmol/L   Chloride 93 (L) 101 - 111 mmol/L   CO2 37 (H) 22 - 32 mmol/L   Glucose, Bld 80 65 - 99 mg/dL   BUN 62 (H) 6 - 20 mg/dL   Creatinine, Ser 1.32 (H) 0.44 - 1.00 mg/dL   Calcium 9.9 8.9 - 10.3 mg/dL   Total Protein 5.6 (L) 6.5 - 8.1 g/dL   Albumin 2.5 (L) 3.5 - 5.0 g/dL   AST 30 15 - 41 U/L   ALT 24 14 - 54 U/L   Alkaline Phosphatase 72 38 - 126 U/L   Total Bilirubin 0.7 0.3 - 1.2 mg/dL   GFR calc non Af Amer 38 (L) >60 mL/min   GFR calc Af Amer 44 (L) >60 mL/min   Anion gap 4 (L) 5 - 15  Urinalysis, Routine w reflex microscopic (not at [facility])  Result Value Ref Range   Color, Urine YELLOW YELLOW   APPearance CLEAR CLEAR   Specific Gravity, Urine 1.015 1.005 - 1.030   pH 5.5 5.0 - 8.0   Glucose, UA NEGATIVE NEGATIVE mg/dL   Hgb urine dipstick NEGATIVE NEGATIVE   Bilirubin Urine NEGATIVE NEGATIVE   Ketones, ur NEGATIVE NEGATIVE mg/dL   Protein, ur NEGATIVE NEGATIVE mg/dL   Urobilinogen, UA 1.0 0.0 - 1.0 mg/dL   Nitrite NEGATIVE NEGATIVE   Leukocytes, UA TRACE (A) NEGATIVE  Urine microscopic-add on  Result Value Ref Range    Squamous Epithelial / LPF RARE RARE   WBC, UA 3-6 <3 WBC/hpf   Casts HYALINE CASTS (A) NEGATIVE  I-Stat CG4 Lactic Acid, ED  Result Value Ref Range   Lactic Acid, Venous 0.50 0.5 - 2.0 mmol/L   Dg Chest 2 View  05/01/2015   CLINICAL DATA:  Increasing lethargy and anorexia. Altered mental status and decreased PO intake.  EXAM: CHEST  2 VIEW  COMPARISON:  03/22/2015, chest CT 03/23/2015 as well as chest CT 11/23/2014  FINDINGS: Patient slightly rotated to the right. Lungs are adequately inflated and demonstrate patchy airspace opacification over the right mid to lower lung. There is stable left perihilar density scarring over the left upper lobe. Mild chronic bibasilar interstitial disease. No evidence of effusion. Cardiomediastinal silhouette is within normal. There is a stable compression fracture over the mid to upper thoracic spine as well as over the upper lumbar spine.  IMPRESSION: New patchy airspace process over the right mid to lower lung likely a pneumonia. Chronic left perihilar density in left upper lobe scarring. Mild chronic bibasilar interstitial disease.  Stable thoracic and lumbar spine compression fractures.   Electronically Signed   By: Marin Olp M.D.   On: 05/01/2015 15:54      I have personally reviewed and evaluated these images and lab results as part of my medical decision-making.    MDM   Iv ns bolus. Labs. Urine. Cxr.  Reviewed nursing notes and prior charts for additional history.   Wbc elev.  cxr with new infiltrate, and fam reports increased cough and lower sats than normal on pulse ox/3 liters, chest congestion on exam - will rx for suspected cap.    Rocephin and zithromax iv.  Given v poor po intake, weakness/confusion, pna - will admit.  Lajean Saver, MD 05/01/15 [phone]

## 2015-05-01 NOTE — H&P (Addendum)
History and Physical  [place] Bridwell JOA:[id number] DOB: 1935/04/12 DOA: 05/01/2015  Referring physician: Dr. Lajean Saver, EDP PCP: Alesia Richards, MD  Outpatient Specialists:  1. Pulmonology: Dr. Baird Lyons 2. Orthopedics: Dr. Nelva Bush 3. Oncology: Dr. Curt Bears  Chief Complaint: Generalized weakness, confusion, poor appetite, cough, urinary incontinence.  HPI: Hailey Keith is a 80 y.o. female with history of COPD, chronic hypoxic respiratory failure on home oxygen 3 L/m continuously, ongoing tobacco abuse, lung cancer (LUL) status post radiation-said to be in remission, HTN, HLD, depression, hard of hearing, recent T5 compression fracture status post steroid injection, presented to the [facility] ED on 05/01/15 with above complaints. Patient unable to provide history secondary to her altered mental status. History is obtained from patient's daughter at bedside. Patient was hospitalized 12/25/14-12/28/14 following which she was in rehabilitation for 4 weeks. Has not done really well since then. Recently seen by pulmonology on 03/22/15 for worsening dyspnea, back pain. Underwent CTA chest which was negative for PE but showed T5 compression fracture. On Tuesday she had steroid injection to the back. Since then she has had a rapid decline with complaints of generalized weakness and poor point where she is not getting up from bed, poor appetite, decreased urinary output, urinary incontinence at times, foul smelling urine, cough with intermittent thick biege colored sputum, rattling sound in the chest, no worsening of chronic dyspnea, no reported fever or chills, worsening confusion but no chest pain. Daughter looked up on the Internet and was concerned that she might be having a urinary tract infection and started her on Levaquin that she had leftover at home. Patient completed 4 days of levofloxacin without improvement in her condition and hence presented to the ED. In the  emergency department, temperature 99.58F, hypoxic at 85% on oxygen, creatinine 1.32, WBC 13.9, hemoglobin 11.4 and chest x-ray shows new patchy airspace process in the right mid to lower lung likely a pneumonia. Hospitalist admission was requested.   Review of Systems: All systems reviewed and apart from history of presenting illness, are negative.  Past Medical History  Diagnosis Date  . Hypertension   . Dyslipidemia   . Hard of hearing   . Hx of radiation therapy 06/18/13- 07/23/13    LUL lung primary, 7020 cGy 26 sessions  . 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 Skin cancer     basal cell  . COPD (chronic obstructive pulmonary disease)   . prediabetes    Past Surgical History  Procedure Laterality Date  . Tonsillectomy      as child, repeat surgery early 20's  . Mohs surgery      "several times"  . Facial reconstruction surgery  25 years ago    to remove skin cancer, right side of face   Social History:  reports that she has been smoking Cigarettes.  She has a 30 pack-year smoking history. She does not have any smokeless tobacco history on file. She reports that she drinks alcohol. She reports that she does not use illicit drugs. Divorced. Patient lives with her daughter. Does not ambulate much over the last few months and more so has not ambulated at all in the last 4- 5 days. Has been smoking up to a week ago/4 cigarettes per day. No alcohol intake as per daughter.  Allergies  Allergen Reactions  . Paxil [Paroxetine Hcl] Other (See Comments)    UNKNOWN    Family History  Problem Relation Age of  Onset  . Hypertension    . Lung cancer Sister     tx w/radiation, met to stomach  . Pneumonia Mother     Deceased  . Heart attack Father     Deceased  . Cancer Sister     "back of neck"    Prior to Admission medications   Medication Sig Start Date End Date Taking? Authorizing Provider  aspirin 81 MG tablet Take 81 mg by mouth daily.   Yes Historical Provider, MD    atorvastatin (LIPITOR) 80 MG tablet Take 80 mg by mouth 2 (two) times a week. WED AND Sun   Yes Historical Provider, MD  citalopram (CELEXA) 40 MG tablet TAKE 1 TABLET BY MOUTH DAILY FOR MOOD 12/24/14  Yes Vicie Mutters, PA-C  doxazosin (CARDURA) 8 MG tablet TAKE 1 TABLET AT BEDTIME 03/21/15  Yes Courtney Forcucci, PA-C  guaifenesin (HUMIBID E) 400 MG TABS tablet Take 400 mg by mouth 2 (two) times daily.   Yes Historical Provider, MD  ipratropium-albuterol (DUONEB) 0.5-2.5 (3) MG/3ML SOLN TAKE 3 MLS BY NEBULIZATION 4 (FOUR) TIMES DAILY. 04/18/15  Yes Vicie Mutters, PA-C  levofloxacin (LEVAQUIN) 500 MG tablet TAKE 1 TABLET (500 MG TOTAL) BY MOUTH DAILY. FOR LUNG INFECTION 04/18/15  Yes Vicie Mutters, PA-C  losartan (COZAAR) 100 MG tablet Take 100 mg by mouth daily.   Yes Historical Provider, MD  Oxycodone HCl 10 MG TABS Take 10 mg by mouth every 4 (four) hours as needed (for pain).   Yes Historical Provider, MD  oxyCODONE-acetaminophen (PERCOCET/ROXICET) 5-325 MG per tablet Take two tablets by mouth every four hours as needed for pain. Do not exceed 4gms of Tylenol in 24 hours 03/28/15  Yes Vicie Mutters, PA-C  predniSONE (DELTASONE) 5 MG tablet Take 1 tablet (5 mg total) by mouth 2 (two) times daily with a meal. 01/03/15  Yes Nishant Dhungel, MD  PROAIR HFA 108 (90 BASE) MCG/ACT inhaler 1 TO 2 INHALATIONS EVERY 4 HOURS AS NEEDED FOR RESCUE ASTHMA 04/18/15  Yes Vicie Mutters, PA-C  Vitamin D, Ergocalciferol, (DRISDOL) 50000 UNITS CAPS capsule TAKE ONE CAPSULE EVERY DAY Patient taking differently: TAKE ONE CAPSULE TWICE PER WEEK on Sunday's and Wednesday's 03/01/15  Yes Vicie Mutters, PA-C   Physical Exam: Filed Vitals:   05/01/15 1438 05/01/15 1449 05/01/15 1644  BP:  99/63 127/57  Pulse:  78 87  Temp:  98.8 F (37.1 C) 99.2 F (37.3 C)  TempSrc:  Oral Rectal  Resp:  16 20  SpO2: 85% 90% 89%     General exam: Moderately built and poorly nourished, elderly female patient, chronically  ill-looking and disheveled, lying propped up on the gurney , intermittently mildly restless and in mild respiratory distress.  Head, eyes and ENT: Nontraumatic and normocephalic. Pupils equally reacting to light and accommodation. Oral mucosa dry.  Neck: Supple. No JVD, carotid bruit or thyromegaly.  Lymphatics: No lymphadenopathy.  Respiratory system: Reduced breath sounds bilaterally with scattered bilateral few medium pitched expiratory rhonchi and basal crackles. Mild increased work of breathing.  Cardiovascular system: S1 and S2 heard, RRR. No JVD, murmurs, gallops, clicks or pedal edema.  Gastrointestinal system: Abdomen is nondistended, soft and nontender. Normal bowel sounds heard. No organomegaly or masses appreciated.  Central nervous system: Alert and oriented only to self. No focal neurological deficits.  Extremities: Symmetric 5 x 5 power. Peripheral pulses symmetrically felt.   Skin: No rashes or acute findings.  Musculoskeletal system: Negative exam.  Psychiatry: Pleasant and cooperative.   Labs on  Admission:  Basic Metabolic Panel:  Recent Labs Lab 05/01/15 1535  NA 134*  K 4.8  CL 93*  CO2 37*  GLUCOSE 80  BUN 62*  CREATININE 1.32*  CALCIUM 9.9   Liver Function Tests:  Recent Labs Lab 05/01/15 1535  AST 30  ALT 24  ALKPHOS 72  BILITOT 0.7  PROT 5.6*  ALBUMIN 2.5*   No results for input(s): LIPASE, AMYLASE in the last 168 hours. No results for input(s): AMMONIA in the last 168 hours. CBC:  Recent Labs Lab 05/01/15 1535  WBC 13.9*  HGB 11.4*  HCT 37.2  MCV 95.9  PLT 285   Cardiac Enzymes: No results for input(s): CKTOTAL, CKMB, CKMBINDEX, TROPONINI in the last 168 hours.  BNP (last 3 results) No results for input(s): PROBNP in the last 8760 hours. CBG: No results for input(s): GLUCAP in the last 168 hours.  Radiological Exams on Admission: Dg Chest 2 View  05/01/2015   CLINICAL DATA:  Increasing lethargy and anorexia. Altered  mental status and decreased PO intake.  EXAM: CHEST  2 VIEW  COMPARISON:  03/22/2015, chest CT 03/23/2015 as well as chest CT 11/23/2014  FINDINGS: Patient slightly rotated to the right. Lungs are adequately inflated and demonstrate patchy airspace opacification over the right mid to lower lung. There is stable left perihilar density scarring over the left upper lobe. Mild chronic bibasilar interstitial disease. No evidence of effusion. Cardiomediastinal silhouette is within normal. There is a stable compression fracture over the mid to upper thoracic spine as well as over the upper lumbar spine.  IMPRESSION: New patchy airspace process over the right mid to lower lung likely a pneumonia. Chronic left perihilar density in left upper lobe scarring. Mild chronic bibasilar interstitial disease.  Stable thoracic and lumbar spine compression fractures.   Electronically Signed   By: Marin Olp M.D.   On: 05/01/2015 15:54    EKG: None seen in Epic for today. We'll order.   Assessment/Plan Principal Problem:   Community acquired pneumonia Active Problems:   Mixed hyperlipidemia   TOBACCO ABUSE   Essential hypertension   Prediabetes   Dehydration   COPD exacerbation   Acute on chronic respiratory failure with hypoxia   Acute encephalopathy   Acute kidney injury   Community-acquired pneumonia (right mid-lower lung) - >90 days out of SNF - Started empirically on IV Rocephin and azithromycin, continue same - Check sputum culture, urine Legionella and streptococcal antigen. Blood cultures if she spikes temperature >101F  COPD exacerbation - Precipitated by pneumonia - IV antibiotics, IV steroids, bronchodilator nebulizations, oxygen support  Acute on chronic respiratory failure with hypoxia (on home oxygen 3 L/m)  - Precipitated by pneumonia and COPD exacerbation - Treat as above - Daughter has made patient DO NOT RESUSCITATE but would like to try BiPAP if needed  Dehydration with  hyponatremia - Brief IV fluids  Acute kidney injury - Brief IV fluids and follow BMP in a.m.  Acute encephalopathy - Secondary to acute illness complicating underlying possible mild dementia. No focal signs. Treat as above and follow clinically  Essential hypertension - Controlled. Continue home medications  Tobacco abuse - Cessation counseling when able  Pre-diabetes - Monitor CBGs and SSI as needed  Lung cancer, status post radiation - Said to be in remission. Supposed to have CT chest next week.    DVT prophylaxis: Lovenox Code Status: DO NOT RESUSCITATE. Daughter however wishes to try BiPAP if needed  Family Communication: Discussed with patient's daughter at bedside.  Disposition Plan: Admit to stepdown unit. Possible discharge in 3-4 days. May likely need SNF again at discharge.   Time spent: 75 minutes  Richa Shor, MD, FACP, FHM. [facility] Pager [phone]  If 7PM-7AM, please contact night-coverage [email or web address] Password [facility] 05/01/2015, 6:35 PM

## 2015-05-01 NOTE — ED Notes (Signed)
Pt is from home.  Around Wednesday Pt began to deteriorate mentally and not eating (Hx of Demntia and COPD) Family saw dark urine and gave her some "leftover" respiratory antibiotics for the last 4 days with no improvement.  Increasing lethargy and anorexia.  No BMs for last few days but is voiding.  Pt is oriented to self and family members, somewhat to situation.

## 2015-05-01 NOTE — ED Notes (Signed)
Patient transported to X-ray 

## 2015-05-01 NOTE — ED Notes (Signed)
Bed: [facility] Expected date:  Expected time:  Means of arrival:  Comments: EMS- 80yo F, confused/UTI?

## 2015-05-02 ENCOUNTER — Inpatient Hospital Stay (HOSPITAL_COMMUNITY): Payer: Medicare Other

## 2015-05-02 DIAGNOSIS — J9622 Acute and chronic respiratory failure with hypercapnia: Secondary | ICD-10-CM

## 2015-05-02 LAB — BASIC METABOLIC PANEL
Anion gap: 7 (ref 5–15)
BUN: 39 mg/dL — AB (ref 6–20)
CHLORIDE: 102 mmol/L (ref 101–111)
CO2: 32 mmol/L (ref 22–32)
Calcium: 9.5 mg/dL (ref 8.9–10.3)
Creatinine, Ser: 0.79 mg/dL (ref 0.44–1.00)
GFR calc Af Amer: >60 mL/min (ref >60)
GFR calc non Af Amer: >60 mL/min (ref >60)
GLUCOSE: 71 mg/dL (ref 65–99)
POTASSIUM: 3.9 mmol/L (ref 3.5–5.1)
Sodium: 141 mmol/L (ref 135–145)

## 2015-05-02 LAB — BLOOD GAS, ARTERIAL
Acid-Base Excess: 1.7 mmol/L (ref 0.0–2.0)
Bicarbonate: 30.4 mEq/L — ABNORMAL HIGH (ref 20.0–24.0)
Drawn by: 276051
O2 Content: 5 L/min
O2 SAT: 80.9 %
PCO2 ART: 71.5 mmHg — AB (ref 35.0–45.0)
PO2 ART: 54.5 mmHg — AB (ref 80.0–100.0)
Patient temperature: 98.6
TCO2: 28.4 mmol/L (ref 0–100)
pH, Arterial: 7.252 — ABNORMAL LOW (ref 7.350–7.450)

## 2015-05-02 LAB — CBC
HEMATOCRIT: 37.8 % (ref 36.0–46.0)
Hemoglobin: 11.7 g/dL — ABNORMAL LOW (ref 12.0–15.0)
MCH: 29.7 pg (ref 26.0–34.0)
MCHC: 31 g/dL (ref 30.0–36.0)
MCV: 95.9 fL (ref 78.0–100.0)
Platelets: 263 10*3/uL (ref 150–400)
RBC: 3.94 MIL/uL (ref 3.87–5.11)
RDW: 14.4 % (ref 11.5–15.5)
WBC: 12.6 10*3/uL — ABNORMAL HIGH (ref 4.0–10.5)

## 2015-05-02 LAB — MRSA PCR SCREENING: MRSA by PCR: NEGATIVE

## 2015-05-02 MED ORDER — SODIUM CHLORIDE 0.9 % IV SOLN: INTRAVENOUS | Status: AC

## 2015-05-02 MED ORDER — LORAZEPAM 2 MG/ML IJ SOLN
0.5000 mg | Freq: Once | INTRAMUSCULAR | Status: AC
  Administered 2015-05-02: 0.5 mg via INTRAVENOUS
  Filled 2015-05-02: qty 1

## 2015-05-02 MED ORDER — IPRATROPIUM-ALBUTEROL 0.5-2.5 (3) MG/3ML IN SOLN
3.0000 mL | RESPIRATORY_TRACT | Status: DC
  Administered 2015-05-02 – 2015-05-06 (×25): 3 mL via RESPIRATORY_TRACT
  Filled 2015-05-02 (×26): qty 3

## 2015-05-02 MED ORDER — HYDRALAZINE HCL 20 MG/ML IJ SOLN
10.0000 mg | Freq: Four times a day (QID) | INTRAMUSCULAR | Status: DC | PRN
  Administered 2015-05-02 – 2015-05-04 (×2): 10 mg via INTRAVENOUS
  Filled 2015-05-02 (×2): qty 1

## 2015-05-02 MED ORDER — ALBUTEROL (5 MG/ML) CONTINUOUS INHALATION SOLN
5.0000 mg/h | INHALATION_SOLUTION | RESPIRATORY_TRACT | Status: DC
  Filled 2015-05-02: qty 20

## 2015-05-02 MED ORDER — ALBUTEROL SULFATE (2.5 MG/3ML) 0.083% IN NEBU
5.0000 mg | INHALATION_SOLUTION | RESPIRATORY_TRACT | Status: DC | PRN
  Administered 2015-05-02: 5 mg via RESPIRATORY_TRACT

## 2015-05-02 MED ORDER — CHLORHEXIDINE GLUCONATE 0.12 % MT SOLN
15.0000 mL | Freq: Two times a day (BID) | OROMUCOSAL | Status: DC
  Administered 2015-05-02 – 2015-05-04 (×5): 15 mL via OROMUCOSAL
  Filled 2015-05-02 (×6): qty 15

## 2015-05-02 MED ORDER — IPRATROPIUM-ALBUTEROL 0.5-2.5 (3) MG/3ML IN SOLN: 3.0000 mL | Freq: Four times a day (QID) | RESPIRATORY_TRACT | Status: DC

## 2015-05-02 MED ORDER — PIPERACILLIN-TAZOBACTAM 3.375 G IVPB
3.3750 g | Freq: Three times a day (TID) | INTRAVENOUS | Status: DC
  Administered 2015-05-02 – 2015-05-04 (×6): 3.375 g via INTRAVENOUS
  Filled 2015-05-02 (×5): qty 50

## 2015-05-02 MED ORDER — METHYLPREDNISOLONE SODIUM SUCC 125 MG IJ SOLR
60.0000 mg | Freq: Four times a day (QID) | INTRAMUSCULAR | Status: DC
  Administered 2015-05-02 – 2015-05-03 (×4): 60 mg via INTRAVENOUS
  Filled 2015-05-02 (×4): qty 2

## 2015-05-02 MED ORDER — CETYLPYRIDINIUM CHLORIDE 0.05 % MT LIQD
7.0000 mL | Freq: Two times a day (BID) | OROMUCOSAL | Status: DC
  Administered 2015-05-02 – 2015-05-04 (×5): 7 mL via OROMUCOSAL

## 2015-05-02 NOTE — Progress Notes (Signed)
ANTIBIOTIC CONSULT NOTE - INITIAL  Pharmacy Consult for Zosyn Indication: aspiration pneumonia  Allergies  Allergen Reactions  . Paxil [Paroxetine Hcl] Other (See Comments)    UNKNOWN    Patient Measurements: Height: '5\' 4"'$  (162.6 cm) Weight: 149 lb 11.1 oz (67.9 kg) IBW/kg (Calculated) : 54.7 Adjusted Body Weight:   Vital Signs: Temp: 97.1 F (36.2 C) (08/29 0630) Temp Source: Axillary (08/29 0630) BP: 102/72 mmHg (08/29 0830) Pulse Rate: 95 (08/29 0830) Intake/Output from previous day: 08/28 0701 - 08/29 0700 In: 1070 [P.O.:180; I.V.:890] Out: -  Intake/Output from this shift: Total I/O In: 186.3 [I.V.:186.3] Out: -   Labs:  Recent Labs  05/01/15 1535 05/02/15 0544  WBC 13.9* 12.6*  HGB 11.4* 11.7*  PLT 285 263  CREATININE 1.32* 0.79   Estimated Creatinine Clearance: 54.9 mL/min (by C-G formula based on Cr of 0.79). No results for input(s): VANCOTROUGH, VANCOPEAK, VANCORANDOM, GENTTROUGH, GENTPEAK, GENTRANDOM, TOBRATROUGH, TOBRAPEAK, TOBRARND, AMIKACINPEAK, AMIKACINTROU, AMIKACIN in the last 72 hours.   Microbiology: No results found for this or any previous visit (from the past 720 hour(s)).  Medical History: Past Medical History  Diagnosis Date  . Hypertension   . Dyslipidemia   . Hard of hearing   . Hx of radiation therapy 06/18/13- 07/23/13    LUL lung primary, 7020 cGy 26 sessions  . 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 Skin cancer     basal cell  . COPD (chronic obstructive pulmonary disease)   . prediabetes      Assessment: 66 yoF with PMH chronic respiratory failure in setting of COPD (on home O2) and h/o lung cancer, smoker, admitted with acute respiratory failure, CAP, dehydration, acute encephalopathy. CXR shows right sided patchy infiltrates and left perihilar airspace disease. Azithromycin and Ceftriaxone started on admission for CAP.  Due to concern for aspiration, changing ceftriaxone to Zosyn and continuing azithromycin.  WBC  elevated, afebrile, CrCl>20 ml/min.  Goal of Therapy:  Doses adjusted per renal function Eradication of infection  Plan:  Zosyn 3.375g IV q8h (4 hour infusion time).   Hershal Coria 05/02/2015,9:34 AM

## 2015-05-02 NOTE — Consult Note (Signed)
Name: Hailey Keith MRN: [id number] DOB: [birth year]    ADMISSION DATE:  05/01/2015 CONSULTATION DATE:  8/29  REFERRING MD :  Algis Liming  CHIEF COMPLAINT:  Acute on chronic respiratory failure   BRIEF PATIENT DESCRIPTION:  This is a 80 year old female w/ chronic respiratory failure in the setting of COPD (on home O2 and pred chronically). Also has h/o LUL lung cancer s/p XRT (now on observational therapy). Sill smokes. Most recently her primary complaints have been related to T5 compression fracture that has been responsible for significant pain. She was admitted w/ working dx of acute on chronic respiratory failure (sats 85% on oxygen), CAP, dehydration and Acute encephalopathy. Apparently while awaiting transfer to the SDU had significant anxiety. Med record review shows two separate doses of IV ativan. PCCM asked to see on 8/29 after pt found to be more lethargic w/ PCO2 in 70s.  SIGNIFICANT EVENTS    STUDIES:     HISTORY OF PRESENT ILLNESS:   This is a 80 year old female w/ chronic respiratory failure in the setting of COPD (on home O2 and pred chronically). Also has h/o LUL lung cancer s/p XRT (now on observational therapy). Most recently her primary complaints have been related to T5 compression fracture that has been responsible for significant pain. She had recently had an steroid injection by Ortho for this. She presented to the ER on 8/28 w/ 5 day decline (since her steroid injection). This included: decreased appetite, worsening fatigue, decreased UOP, intermittent urinary incontinence and then more recently cough w/ thick beige colored sputum, chest rattling, and worsening confusion. Her daughter had been concerned about a UTI and placed her on left over levaquin for this about 4 d prior to admit. When her symptoms did not improve she brought her to the hospital. On evaluation she was found to be confused, anxious. PCXR showed right sided patchy infiltrates and Left perihilar  airspace disease. She was admitted w/ working dx of acute on chronic respiratory failure (sats 85% on oxygen), CAP, dehydration and Acute encephalopathy. Apparently while awaiting transfer to the SDU had significant anxiety. Med record review shows two separate doses of IV ativan. PCCM asked to see on 8/29 after pt found to be more lethargic w/ PCO2 in 70s.  PAST MEDICAL HISTORY :   has a past medical history of Hypertension; Dyslipidemia; Hard of hearing; radiation therapy (06/18/13- 07/23/13); Lung cancer (05/20/13); Skin cancer; COPD (chronic obstructive pulmonary disease); and prediabetes.  has past surgical history that includes Tonsillectomy; Mohs surgery; and Facial reconstruction surgery (25 years ago). Prior to Admission medications   Medication Sig Start Date End Date Taking? Authorizing Provider  aspirin 81 MG tablet Take 81 mg by mouth daily.   Yes Historical Provider, MD  atorvastatin (LIPITOR) 80 MG tablet Take 80 mg by mouth 2 (two) times a week. WED AND Sun   Yes Historical Provider, MD  citalopram (CELEXA) 40 MG tablet TAKE 1 TABLET BY MOUTH DAILY FOR MOOD 12/24/14  Yes Vicie Mutters, PA-C  doxazosin (CARDURA) 8 MG tablet TAKE 1 TABLET AT BEDTIME 03/21/15  Yes Courtney Forcucci, PA-C  guaifenesin (HUMIBID E) 400 MG TABS tablet Take 400 mg by mouth 2 (two) times daily.   Yes Historical Provider, MD  ipratropium-albuterol (DUONEB) 0.5-2.5 (3) MG/3ML SOLN TAKE 3 MLS BY NEBULIZATION 4 (FOUR) TIMES DAILY. 04/18/15  Yes Vicie Mutters, PA-C  levofloxacin (LEVAQUIN) 500 MG tablet TAKE 1 TABLET (500 MG TOTAL) BY MOUTH DAILY. FOR LUNG INFECTION 04/18/15  Yes Vicie Mutters, PA-C  losartan (COZAAR) 100 MG tablet Take 100 mg by mouth daily.   Yes Historical Provider, MD  Oxycodone HCl 10 MG TABS Take 10 mg by mouth every 4 (four) hours as needed (for pain).   Yes Historical Provider, MD  oxyCODONE-acetaminophen (PERCOCET/ROXICET) 5-325 MG per tablet Take two tablets by mouth every four hours as  needed for pain. Do not exceed 4gms of Tylenol in 24 hours 03/28/15  Yes Vicie Mutters, PA-C  predniSONE (DELTASONE) 5 MG tablet Take 1 tablet (5 mg total) by mouth 2 (two) times daily with a meal. 01/03/15  Yes Nishant Dhungel, MD  PROAIR HFA 108 (90 BASE) MCG/ACT inhaler 1 TO 2 INHALATIONS EVERY 4 HOURS AS NEEDED FOR RESCUE ASTHMA 04/18/15  Yes Vicie Mutters, PA-C  Vitamin D, Ergocalciferol, (DRISDOL) 50000 UNITS CAPS capsule TAKE ONE CAPSULE EVERY DAY Patient taking differently: TAKE ONE CAPSULE TWICE PER WEEK on Sunday's and Wednesday's 03/01/15  Yes Vicie Mutters, PA-C   Allergies  Allergen Reactions  . Paxil [Paroxetine Hcl] Other (See Comments)    UNKNOWN    FAMILY HISTORY:  family history includes Cancer in her sister; Heart attack in her father; Hypertension in an other family member; Lung cancer in her sister; Pneumonia in her mother. SOCIAL HISTORY:  reports that she has been smoking Cigarettes.  She has a 30 pack-year smoking history. She does not have any smokeless tobacco history on file. She reports that she drinks alcohol. She reports that she does not use illicit drugs.  REVIEW OF SYSTEMS:   Unable d/t delirium   SUBJECTIVE:  Acute on chronically ill appearing white female on NIPPV VITAL SIGNS: Temp:  [97.1 F (36.2 C)-99.2 F (37.3 C)] 97.1 F (36.2 C) (08/29 0630) Pulse Rate:  [78-108] 95 (08/29 0830) Resp:  [16-21] 18 (08/29 0830) BP: (99-149)/(57-109) 102/72 mmHg (08/29 0830) SpO2:  [82 %-97 %] 89 % (08/29 0830) FiO2 (%):  [45 %-50 %] 50 % (08/29 0830) Weight:  [66.5 kg (146 lb 9.7 oz)-67.9 kg (149 lb 11.1 oz)] 67.9 kg (149 lb 11.1 oz) (08/29 0830)  PHYSICAL EXAMINATION: General:  Chronically ill appearing female, currently encephalopathic, responding to gentle tactile stim, but not verbal Neuro:  Lethargic moves all ext, non-focal.  HEENT:  MM dry, neck veins flat, very poor dentition Cardiovascular:  rrr w/out murmur  Lungs:  Decreased t/o. Remarkably  decreased R>L. Marked accessory muscle use  Abdomen:  Soft, non-tender. + bowel sounds  Musculoskeletal:  Intact  Skin:  Scattered areas of ecchymosis    Recent Labs Lab 05/01/15 1535 05/02/15 0544  NA 134* 141  K 4.8 3.9  CL 93* 102  CO2 37* 32  BUN 62* 39*  CREATININE 1.32* 0.79  GLUCOSE 80 71    Recent Labs Lab 05/01/15 1535 05/02/15 0544  HGB 11.4* 11.7*  HCT 37.2 37.8  WBC 13.9* 12.6*  PLT 285 263   ABG    Component Value Date/Time   PHART 7.252* 05/02/2015 0750   PCO2ART 71.5* 05/02/2015 0750   PO2ART 54.5* 05/02/2015 0750   HCO3 30.4* 05/02/2015 0750   TCO2 28.4 05/02/2015 0750   O2SAT 80.9 05/02/2015 0750    Recent Labs Lab 05/01/15 1535 05/01/15 1735 05/02/15 0544  WBC 13.9*  --  12.6*  LATICACIDVEN  --  0.50  --         Dg Chest 2 View  05/01/2015   CLINICAL DATA:  Increasing lethargy and anorexia. Altered mental status and decreased PO intake.  EXAM: CHEST  2 VIEW  COMPARISON:  03/22/2015, chest CT 03/23/2015 as well as chest CT 11/23/2014  FINDINGS: Patient slightly rotated to the right. Lungs are adequately inflated and demonstrate patchy airspace opacification over the right mid to lower lung. There is stable left perihilar density scarring over the left upper lobe. Mild chronic bibasilar interstitial disease. No evidence of effusion. Cardiomediastinal silhouette is within normal. There is a stable compression fracture over the mid to upper thoracic spine as well as over the upper lumbar spine.  IMPRESSION: New patchy airspace process over the right mid to lower lung likely a pneumonia. Chronic left perihilar density in left upper lobe scarring. Mild chronic bibasilar interstitial disease.  Stable thoracic and lumbar spine compression fractures.   Electronically Signed   By: Marin Olp M.D.   On: 05/01/2015 15:54   Dg Chest Port 1 View  05/02/2015   CLINICAL DATA:  Hypoxia.  Shortness of breath.  EXAM: PORTABLE CHEST - 1 VIEW  COMPARISON:   05/01/2015  FINDINGS: Stable abnormal bilateral interstitial accentuation with minimally improved airspace opacities above the right minor fissure and in the right middle lobe. Thickening of the minor fissure may reflect some pleural fluid on the right. Heart size within normal limits for technique.  The patient is rotated to the right on today's radiograph, reducing diagnostic sensitivity and specificity. There is considerable underlying emphysema.  IMPRESSION: 1. Emphysema with superimposed diffuse interstitial accentuation, and mild airspace opacities in the right upper lobe and right middle lobe. There is no current cardiomegaly, raising the possibilities of atypical pneumonia, noncardiogenic edema, or drug reaction superimposed on underlying emphysema. The right basilar airspace opacities have minimally improved compared to yesterday. 2. Suspected trace right pleural effusion.   Electronically Signed   By: Van Clines M.D.   On: 05/02/2015 08:33    ASSESSMENT / PLAN:  Acute on Chronic Hypoxic and Hypercarbic Respiratory failure in the setting of CAP vs aspiration c/b AECOPD and sedative effect of benzos.  On-going tobacco abuse   Discussion Has chronic resp failure (on chronic O2 and prednisone). Calculated CO2 in 50s at baseline. Now here w/ CAP vs aspiration and acute encephalopathy d/t her infection. Suspect that her worsening MS is r/t benzos over night. She is not a candidate for mechanical ventilation d/t her chronic resp failure and very poor chance she would come off from a vent. She does respond to gentle tactile stimulation. Think that reversing the benzos would likely cause her more distress at this point so therefore will hold the course.   Plan Cont NIPPV Scheduled BDs Will change rocephin to zosyn to cover for aspiration  Cont azithro  Scheduled solumedrol  Wean O2 Hold all sedating meds and narcotics No further escalation   Sepsis in setting of CAP vs aspiration.  Lactic acid 0.5 Plan Cont IVFs Avoid hypotension Cont abx  Dehydration w/ mild AKI-->improved Plan Avoid hypotension Cont IVFs  Acute encephalopathy  Likely d/t sepsis. Worsened by hypercarbia. Did have the recent spinal injection. Not sure if this is significant or not but ? meningitis from contamination would be on the D-dx.  Plan Cont supportive care Stop all sedating meds  Anemia of chronic disease Plan Trend CBC LMWH   Erick Colace ACNP-BC [place] Pager # [phone] OR # [phone] if no answer   05/02/2015, 8:52 AM

## 2015-05-02 NOTE — Progress Notes (Signed)
On initial assessment patient showing labored breathing with accessory muscles.  Respiratory therapy and MD called.  Pt placed on venturi mask at 10 L.  MD placed order for step-down.  Patient transported to ICU, report given to RN.

## 2015-05-02 NOTE — Progress Notes (Signed)
PT Cancellation Note  Patient Details Name: [name] MRN: [id number] DOB: June 05, 1935   Cancelled Treatment:    Reason Eval/Treat Not Completed: Medical issues which prohibited therapy Pt currently on BiPAP and not appropriate to work with therapy today per RN.  Will check back as schedule permits.   Nyal Schachter,[name] 05/02/2015, 12:19 PM Carmelia Bake, PT, DPT 05/02/2015 Pager: [phone]

## 2015-05-02 NOTE — Progress Notes (Signed)
RN noticed pt labored breathing got worst and using her accessory muscles, N.P. Informed and she ordered to give breathing treatment and follow up with the rounding physician. M.D. Is assessing the patient at this time.

## 2015-05-02 NOTE — Progress Notes (Addendum)
PROGRESS NOTE    Hailey Keith HRC:[id number] DOB: 04/24/1935 DOA: 05/01/2015 PCP: Alesia Richards, MD  Outpatient Specialists:  1. Pulmonology: Dr. Baird Lyons 2. Orthopedics: Dr. Nelva Bush 3. Oncology: Dr. Curt Bears  HPI/Brief narrative 80 y.o. female with history of COPD, chronic hypoxic respiratory failure on home oxygen 3 L/m continuously, ongoing tobacco abuse, lung cancer (LUL) status post radiation-said to be in remission, HTN, HLD, depression, hard of hearing, recent T5 compression fracture status post steroid injection, presented to the [facility] ED on 05/01/15 with 4 days history of progressive productive generalized weakness, confusion, poor appetite, cough and urinary incontinence. In the emergency department, temperature 99.57F, hypoxic at 85% on oxygen, creatinine 1.32, WBC 13.9, hemoglobin 11.4 and chest x-ray shows new patchy airspace process in the right mid to lower lung likely a pneumonia.   Prior to seeing patient in the ED, after discussing with EDP-initial plan was to admit to telemetry. However after I assessed the patient in the ED, she seemed more appropriate for stepdown unit. I discussed with Dr. Ashok Cordia and advised him to change admission to stepdown unit. I too placed orders for admission to stepdown unit. She was admitted for community-acquired pneumonia, COPD exacerbation, acute on chronic respiratory failure and acute encephalopathy. Due to unknown reasons, patient however was admitted to telemetry. Overnight and up to this morning, she was given 2 small doses of IV Ativan probably for agitation. I was contacted by patient's nurse at approximately 7:30 AM stating that patient was in respiratory distress. On evaluation, she was somnolent, had mild-to-moderate respiratory distress with active accessory muscles. She was moved to stepdown unit and CCM was consulted. All of this was updated to daughter at bedside. Requested patient's RN and [address] [place]  to investigate why this happened.  Assessment/Plan:  Community-acquired pneumonia (right mid-lower lung) - >90 days out of SNF - Started empirically on IV Rocephin and azithromycin - Check sputum culture, urine Legionella and streptococcal antigen. Blood cultures if she spikes temperature >101F - due to worsened respiratory status on 8/29 and concern for aspiration, antibiotics were changed from IV Rocephin to Zosyn.  COPD exacerbation - Precipitated by pneumonia - IV antibiotics, IV steroids, bronchodilator nebulizations, oxygen support  Acute on chronic respiratory failure with hypoxia (on home oxygen 3 L/m) & hypercapnia - Precipitated by pneumonia and COPD exacerbation - Daughter has made patient DO NOT RESUSCITATE but would like to try BiPAP if needed - Patient in extremis on 8/29-likely precipitated by Ativan. Transferred to stepdown unit. Obtained stat ABG (hypercapnic, hypoxic acidosis) and chest x-ray. Placed on BiPAP. CCM consulted.  Sepsis - Met sepsis criteria on 05/02/15. Secondary to worsening pneumonia and mental status. Continue IV fluids and antibiotics as above. Lactate normal.  Dehydration with hyponatremia - Improved after IV fluids.  Acute kidney injury - Brief IV fluids on admission and acute kidney injury resolved.  Acute encephalopathy - Secondary to acute illness complicating underlying possible mild dementia. No focal signs. Treat as above and follow clinically - Mental status worse today secondary to hypercapnia and sedation. Avoid unnecessary sedating medications. Monitor with management as above  Essential hypertension - Controlled. Continue home medications  Tobacco abuse - Cessation counseling when able  Pre-diabetes - Monitor CBGs and SSI as needed  Lung cancer, status post radiation - Said to be in remission. Supposed to have CT chest next week.  Anemia - Stable   DVT prophylaxis: Lovenox Code Status: DO NOT RESUSCITATE. Daughter  however wishes to try BiPAP if  needed  Family Communication: Discussed with patient's daughter at bedside.  Disposition Plan: Admit to stepdown unit. Possible discharge in 3-4 days. May likely need SNF again at discharge.  Consultation  PCCM  Procedures  BiPAP   Antibiotics:  IV Rocephin 8/28 > 8/29  IV Zosyn 8/29 >  IV azithromycin   Subjective: Overnight events noted. Difficulty breathing this morning. Patient somnolent and unable to provide any history.  Objective: Filed Vitals:   05/02/15 1500 05/02/15 1515 05/02/15 1520 05/02/15 1600  BP:    143/92  Pulse: 75 73  85  Temp:    97.7 F (36.5 C)  TempSrc:    Axillary  Resp: $Remo'18 18  24  'OxVwf$ Height:      Weight:      SpO2: 95% 94% 92% 96%    Intake/Output Summary (Last 24 hours) at 05/02/15 1724 Last data filed at 05/02/15 1600  Gross per 24 hour  Intake 1426.25 ml  Output      0 ml  Net 1426.25 ml   Filed Weights   05/01/15 1903 05/02/15 0830  Weight: 66.5 kg (146 lb 9.7 oz) 67.9 kg (149 lb 11.1 oz)     Exam:  General exam: Ill-looking elderly female propped up in bed with mild to moderate respiratory distress and active accessory muscles. Respiratory system: Reduced breath sounds bilaterally with scattered bilateral expiratory rhonchi and occasional basal crackles.  Cardiovascular system: S1 & S2 heard, RRR. No JVD, murmurs, gallops, clicks or pedal edema. Telemetry: Sinus rhythm  Gastrointestinal system: Abdomen is nondistended, soft and nontender. Normal bowel sounds heard. Central nervous system: Somnolent, barely arousable this morning and does not follow instructions. Restless.. No focal neurological deficits. Extremities: Symmetric 5 x 5 power.   Data Reviewed: Basic Metabolic Panel:  Recent Labs Lab 05/01/15 1535 05/02/15 0544  NA 134* 141  K 4.8 3.9  CL 93* 102  CO2 37* 32  GLUCOSE 80 71  BUN 62* 39*  CREATININE 1.32* 0.79  CALCIUM 9.9 9.5   Liver Function Tests:  Recent Labs Lab  05/01/15 1535  AST 30  ALT 24  ALKPHOS 72  BILITOT 0.7  PROT 5.6*  ALBUMIN 2.5*   No results for input(s): LIPASE, AMYLASE in the last 168 hours. No results for input(s): AMMONIA in the last 168 hours. CBC:  Recent Labs Lab 05/01/15 1535 05/02/15 0544  WBC 13.9* 12.6*  HGB 11.4* 11.7*  HCT 37.2 37.8  MCV 95.9 95.9  PLT 285 263   Cardiac Enzymes: No results for input(s): CKTOTAL, CKMB, CKMBINDEX, TROPONINI in the last 168 hours. BNP (last 3 results) No results for input(s): PROBNP in the last 8760 hours. CBG: No results for input(s): GLUCAP in the last 168 hours.  Recent Results (from the past 240 hour(s))  MRSA PCR Screening     Status: None   Collection Time: 05/02/15  8:21 AM  Result Value Ref Range Status   MRSA by PCR NEGATIVE NEGATIVE Final    Comment:        The GeneXpert MRSA Assay (FDA approved for NASAL specimens only), is one component of a comprehensive MRSA colonization surveillance program. It is not intended to diagnose MRSA infection nor to guide or monitor treatment for MRSA infections.            Studies: Dg Chest 2 View  05/01/2015   CLINICAL DATA:  Increasing lethargy and anorexia. Altered mental status and decreased PO intake.  EXAM: CHEST  2 VIEW  COMPARISON:  03/22/2015, chest  CT 03/23/2015 as well as chest CT 11/23/2014  FINDINGS: Patient slightly rotated to the right. Lungs are adequately inflated and demonstrate patchy airspace opacification over the right mid to lower lung. There is stable left perihilar density scarring over the left upper lobe. Mild chronic bibasilar interstitial disease. No evidence of effusion. Cardiomediastinal silhouette is within normal. There is a stable compression fracture over the mid to upper thoracic spine as well as over the upper lumbar spine.  IMPRESSION: New patchy airspace process over the right mid to lower lung likely a pneumonia. Chronic left perihilar density in left upper lobe scarring. Mild  chronic bibasilar interstitial disease.  Stable thoracic and lumbar spine compression fractures.   Electronically Signed   By: Marin Olp M.D.   On: 05/01/2015 15:54   Dg Chest Port 1 View  05/02/2015   CLINICAL DATA:  Hypoxia.  Shortness of breath.  EXAM: PORTABLE CHEST - 1 VIEW  COMPARISON:  05/01/2015  FINDINGS: Stable abnormal bilateral interstitial accentuation with minimally improved airspace opacities above the right minor fissure and in the right middle lobe. Thickening of the minor fissure may reflect some pleural fluid on the right. Heart size within normal limits for technique.  The patient is rotated to the right on today's radiograph, reducing diagnostic sensitivity and specificity. There is considerable underlying emphysema.  IMPRESSION: 1. Emphysema with superimposed diffuse interstitial accentuation, and mild airspace opacities in the right upper lobe and right middle lobe. There is no current cardiomegaly, raising the possibilities of atypical pneumonia, noncardiogenic edema, or drug reaction superimposed on underlying emphysema. The right basilar airspace opacities have minimally improved compared to yesterday. 2. Suspected trace right pleural effusion.   Electronically Signed   By: Van Clines M.D.   On: 05/02/2015 08:33        Scheduled Meds: . antiseptic oral rinse  7 mL Mouth Rinse q12n4p  . aspirin  81 mg Oral Daily  . [START ON 05/04/2015] atorvastatin  80 mg Oral Once per day on Sun Wed  . azithromycin  500 mg Intravenous Q24H  . chlorhexidine  15 mL Mouth Rinse BID  . doxazosin  8 mg Oral QHS  . enoxaparin (LOVENOX) injection  40 mg Subcutaneous Q24H  . guaiFENesin  1,200 mg Oral BID  . ipratropium-albuterol  3 mL Nebulization Q4H  . losartan  100 mg Oral Daily  . methylPREDNISolone (SOLU-MEDROL) injection  60 mg Intravenous Q6H  . piperacillin-tazobactam (ZOSYN)  IV  3.375 g Intravenous Q8H  . sodium chloride  3 mL Intravenous Q12H   Continuous  Infusions:    Principal Problem:   Community acquired pneumonia Active Problems:   Mixed hyperlipidemia   TOBACCO ABUSE   Essential hypertension   Prediabetes   Dehydration   COPD exacerbation   Acute on chronic respiratory failure with hypoxia   Acute encephalopathy   Acute kidney injury    Time spent: 76 minutes   Damari Suastegui, MD, FACP, FHM. [facility] Pager [phone]  If 7PM-7AM, please contact night-coverage [email or web address] Password [email or web address] 05/02/2015, 5:24 PM    LOS: 1 day

## 2015-05-02 NOTE — Care Management Note (Signed)
Case Management Note  Patient Details  Name: PORSCHE NOGUCHI MRN: [id number] Date of Birth: 08/08/1935  Subjective/Objective:                 resp failure acute and chronic    Action/Plan:Date:  May 02, 2015 U.R. performed for needs and level of care. Will continue to follow for Case Management needs.  Velva Harman, RN, BSN, [place]   [phone]   Expected Discharge Date:                  Expected Discharge Plan:  Home/Self Care  In-House Referral:  NA  Discharge planning Services  CM Consult  Post Acute Care Choice:  NA Choice offered to:  NA  DME Arranged:    DME Agency:     HH Arranged:    [place] Agency:     Status of Service:  Completed, signed off  Medicare Important Message Given:    Date Medicare IM Given:    Medicare IM give by:    Date Additional Medicare IM Given:    Additional Medicare Important Message give by:     If discussed at [place] of Stay Meetings, dates discussed:    Additional Comments:  Leeroy Cha, RN 05/02/2015, 9:49 AM

## 2015-05-03 ENCOUNTER — Telehealth: Payer: Self-pay | Admitting: Medical Oncology

## 2015-05-03 DIAGNOSIS — J9622 Acute and chronic respiratory failure with hypercapnia: Secondary | ICD-10-CM

## 2015-05-03 DIAGNOSIS — J690 Pneumonitis due to inhalation of food and vomit: Principal | ICD-10-CM

## 2015-05-03 DIAGNOSIS — J9621 Acute and chronic respiratory failure with hypoxia: Secondary | ICD-10-CM | POA: Insufficient documentation

## 2015-05-03 LAB — CBC
HEMATOCRIT: 40.4 % (ref 36.0–46.0)
Hemoglobin: 12.3 g/dL (ref 12.0–15.0)
MCH: 29.2 pg (ref 26.0–34.0)
MCHC: 30.4 g/dL (ref 30.0–36.0)
MCV: 96 fL (ref 78.0–100.0)
PLATELETS: 310 10*3/uL (ref 150–400)
RBC: 4.21 MIL/uL (ref 3.87–5.11)
RDW: 14.3 % (ref 11.5–15.5)
WBC: 14.4 10*3/uL — AB (ref 4.0–10.5)

## 2015-05-03 LAB — EXPECTORATED SPUTUM ASSESSMENT W REFEX TO RESP CULTURE

## 2015-05-03 LAB — BASIC METABOLIC PANEL
ANION GAP: 10 (ref 5–15)
BUN: 31 mg/dL — ABNORMAL HIGH (ref 6–20)
CALCIUM: 10 mg/dL (ref 8.9–10.3)
CO2: 31 mmol/L (ref 22–32)
CREATININE: 0.82 mg/dL (ref 0.44–1.00)
Chloride: 102 mmol/L (ref 101–111)
GLUCOSE: 191 mg/dL — AB (ref 65–99)
Potassium: 4.7 mmol/L (ref 3.5–5.1)
Sodium: 143 mmol/L (ref 135–145)

## 2015-05-03 LAB — HIV ANTIBODY (ROUTINE TESTING W REFLEX): HIV SCREEN 4TH GENERATION: NONREACTIVE

## 2015-05-03 LAB — EXPECTORATED SPUTUM ASSESSMENT W GRAM STAIN, RFLX TO RESP C

## 2015-05-03 LAB — STREP PNEUMONIAE URINARY ANTIGEN: STREP PNEUMO URINARY ANTIGEN: POSITIVE — AB

## 2015-05-03 MED ORDER — SODIUM CHLORIDE 0.9 % IV SOLN
INTRAVENOUS | Status: DC
Start: 2015-05-03 — End: 2015-05-04
  Administered 2015-05-03: 14:00:00 via INTRAVENOUS

## 2015-05-03 MED ORDER — HALOPERIDOL LACTATE 5 MG/ML IJ SOLN: 1.0000 mg | Freq: Four times a day (QID) | INTRAMUSCULAR | Status: DC | PRN

## 2015-05-03 MED ORDER — METHYLPREDNISOLONE SODIUM SUCC 125 MG IJ SOLR
60.0000 mg | Freq: Two times a day (BID) | INTRAMUSCULAR | Status: DC
  Administered 2015-05-03 – 2015-05-04 (×2): 60 mg via INTRAVENOUS
  Filled 2015-05-03 (×2): qty 2

## 2015-05-03 MED ORDER — OXYCODONE-ACETAMINOPHEN 5-325 MG PO TABS
2.0000 | ORAL_TABLET | Freq: Once | ORAL | Status: AC
  Administered 2015-05-03: 2 via ORAL
  Filled 2015-05-03: qty 2

## 2015-05-03 MED ORDER — HYDRALAZINE HCL 20 MG/ML IJ SOLN
10.0000 mg | Freq: Once | INTRAMUSCULAR | Status: AC
  Administered 2015-05-03: 10 mg via INTRAVENOUS
  Filled 2015-05-03: qty 1

## 2015-05-03 MED ORDER — LORAZEPAM 2 MG/ML IJ SOLN
1.0000 mg | Freq: Once | INTRAMUSCULAR | Status: AC
  Administered 2015-05-03: 1 mg via INTRAVENOUS
  Filled 2015-05-03: qty 1

## 2015-05-03 MED ORDER — METOPROLOL TARTRATE 1 MG/ML IV SOLN
5.0000 mg | Freq: Once | INTRAVENOUS | Status: AC
  Administered 2015-05-03: 2.5 mg via INTRAVENOUS
  Filled 2015-05-03: qty 5

## 2015-05-03 NOTE — Evaluation (Signed)
Physical Therapy Evaluation Patient Details Name: Hailey Keith MRN: [id number] DOB: 07/31/1935 Today's Date: 05/03/2015   History of Present Illness  80 y.o. female with history of COPD, chronic hypoxic respiratory failure on home oxygen 3 L/m continuously, ongoing tobacco abuse, lung cancer (LUL) status post radiation-said to be in remission, HTN, HLD, depression, hard of hearing, recent T5 compression fracture status post steroid injection, presented to the [facility] ED on 05/01/15 with CAP.   Clinical Impression  Pt admitted with above diagnosis. Pt currently with functional limitations due to the deficits listed below (see PT Problem List). +2 max assist for bed to recliner transfer.  Pt will benefit from skilled PT to increase their independence and safety with mobility to allow discharge to the venue listed below.       Follow Up Recommendations SNF;Supervision/Assistance - 24 hour    Equipment Recommendations  None recommended by PT    Recommendations for Other Services       Precautions / Restrictions Precautions Precautions: Fall Precaution Comments: monitor O2 Restrictions Weight Bearing Restrictions: No      Mobility  Bed Mobility Overal bed mobility: Needs Assistance Bed Mobility: Supine to Sit     Supine to sit: +2 for physical assistance;Max assist     General bed mobility comments: assist for LEs and to raise trunk  Transfers Overall transfer level: Needs assistance Equipment used: Rolling walker (2 wheeled) Transfers: Sit to/from [employer] Transfers Sit to Stand: +2 physical assistance;Max assist Stand pivot transfers: +2 physical assistance;Max assist       General transfer comment: assist to rise, steady, pivot; verbal cues for hand placement, increased time  Ambulation/Gait             General Gait Details: HR 123, SaO2 90% on 15L ventimask  Stairs            Wheelchair Mobility    Modified Rankin (Stroke  Patients Only)       Balance Overall balance assessment: Needs assistance   Sitting balance-Leahy Scale: Fair       Standing balance-Leahy Scale: Poor                               Pertinent Vitals/Pain Pain Assessment: No/denies pain    Home Living Family/patient expects to be discharged to:: Private residence Living Arrangements: Children Available Help at Discharge: Family;Available 24 hours/day Type of Home: House Home Access: Level entry     Home Layout: One level Home Equipment: Walker - 2 wheels;Hand held shower head;Bedside commode;Transport chair;Tub bench      Prior Function           Comments: walked independently, assist for dressing     Hand Dominance        Extremity/Trunk Assessment   Upper Extremity Assessment: Generalized weakness           Lower Extremity Assessment: Generalized weakness         Communication   Communication: HOH  Cognition Arousal/Alertness: Lethargic   Overall Cognitive Status: Impaired/Different from baseline Area of Impairment: Orientation;Attention;Memory;Following commands;Awareness;Problem solving                    General Comments      Exercises        Assessment/Plan    PT Assessment Patient needs continued PT services  PT Diagnosis Difficulty walking;Generalized weakness;Altered mental status   PT Problem List Decreased strength;Decreased activity  tolerance;Decreased balance;Cardiopulmonary status limiting activity;Decreased mobility  PT Treatment Interventions Gait training;Functional mobility training;Therapeutic activities;Patient/family education;Balance training;Therapeutic exercise   PT Goals (Current goals can be found in the Care Plan section) Acute Rehab PT Goals Patient Stated Goal: to walk PT Goal Formulation: With family Time For Goal Achievement: 05/17/15 Potential to Achieve Goals: Fair    Frequency Min 3X/week   Barriers to discharge         Co-evaluation               End of Session Equipment Utilized During Treatment: Gait belt;Oxygen Activity Tolerance: Patient limited by fatigue Patient left: in chair;with call bell/phone within reach;with family/visitor present Nurse Communication: Mobility status         Time: 7793-9030 PT Time Calculation (min) (ACUTE ONLY): 20 min   Charges:   PT Evaluation $Initial PT Evaluation Tier I: 1 Procedure     PT G CodesBlondell Reveal Keith 05/03/2015, 1:12 PM [phone]

## 2015-05-03 NOTE — Telephone Encounter (Signed)
Note to Hailey Keith -daughter requests results.

## 2015-05-03 NOTE — Progress Notes (Signed)
Pt currently on 45% VM and tolerating well at this time.  RT will hold BIPAP for now.  RT to monitor and assess as needed.

## 2015-05-03 NOTE — Progress Notes (Signed)
RN took pt off BIPAP @ 0240. Pt not tolerating. Pt placed on 3L [place]. Sats 94%.

## 2015-05-03 NOTE — Progress Notes (Signed)
Name: Hailey Keith MRN: [id number] DOB: [birth year]    ADMISSION DATE:  05/01/2015 CONSULTATION DATE:  8/29  REFERRING MD :  Algis Liming  CHIEF COMPLAINT:  Acute on chronic respiratory failure   BRIEF PATIENT DESCRIPTION:  This is a 80 year old female w/ chronic respiratory failure in the setting of COPD (on home O2 and pred chronically). Also has h/o LUL lung cancer s/p XRT (now on observational therapy). Sill smokes. Most recently her primary complaints have been related to T5 compression fracture that has been responsible for significant pain. She was admitted w/ working dx of acute on chronic respiratory failure (sats 85% on oxygen), CAP, dehydration and Acute encephalopathy. Apparently while awaiting transfer to the SDU had significant anxiety. Med record review shows two separate doses of IV ativan. PCCM asked to see on 8/29 after pt found to be more lethargic w/ PCO2 in 70s.  SIGNIFICANT EVENTS    STUDIES:    SUBJECTIVE:  Still confused   VITAL SIGNS: Temp:  [97.7 F (36.5 C)-99.9 F (37.7 C)] 98.3 F (36.8 C) (08/30 0812) Pulse Rate:  [73-86] 83 (08/30 0026) Resp:  [16-28] 28 (08/30 0026) BP: (117-194)/(59-107) 194/74 mmHg (08/30 0113) SpO2:  [91 %-98 %] 97 % (08/30 0812) FiO2 (%):  [40 %-100 %] 100 % (08/30 0812)  PHYSICAL EXAMINATION: General:  Chronically ill appearing female, currently encephalopathic, more awake.  Neuro:  More awake, still encephalopathic, non-focal.  HEENT:  MM dry, neck veins flat, very poor dentition Cardiovascular:  rrr w/out murmur  Lungs: Marked accessory muscle use, prolonged exp wheeze Abdomen:  Soft, non-tender. + bowel sounds  Musculoskeletal:  Intact  Skin:  Scattered areas of ecchymosis    Recent Labs Lab 05/01/15 1535 05/02/15 0544 05/03/15 0405  NA 134* 141 143  K 4.8 3.9 4.7  CL 93* 102 102  CO2 37* 32 31  BUN 62* 39* 31*  CREATININE 1.32* 0.79 0.82  GLUCOSE 80 71 191*    Recent Labs Lab 05/01/15 1535  05/02/15 0544 05/03/15 0405  HGB 11.4* 11.7* 12.3  HCT 37.2 37.8 40.4  WBC 13.9* 12.6* 14.4*  PLT 285 263 310   ABG    Component Value Date/Time   PHART 7.252* 05/02/2015 0750   PCO2ART 71.5* 05/02/2015 0750   PO2ART 54.5* 05/02/2015 0750   HCO3 30.4* 05/02/2015 0750   TCO2 28.4 05/02/2015 0750   O2SAT 80.9 05/02/2015 0750    Recent Labs Lab 05/01/15 1535 05/01/15 1735 05/02/15 0544 05/03/15 0405  WBC 13.9*  --  12.6* 14.4*  LATICACIDVEN  --  0.50  --   --     Dg Chest 2 View  05/01/2015   CLINICAL DATA:  Increasing lethargy and anorexia. Altered mental status and decreased PO intake.  EXAM: CHEST  2 VIEW  COMPARISON:  03/22/2015, chest CT 03/23/2015 as well as chest CT 11/23/2014  FINDINGS: Patient slightly rotated to the right. Lungs are adequately inflated and demonstrate patchy airspace opacification over the right mid to lower lung. There is stable left perihilar density scarring over the left upper lobe. Mild chronic bibasilar interstitial disease. No evidence of effusion. Cardiomediastinal silhouette is within normal. There is a stable compression fracture over the mid to upper thoracic spine as well as over the upper lumbar spine.  IMPRESSION: New patchy airspace process over the right mid to lower lung likely a pneumonia. Chronic left perihilar density in left upper lobe scarring. Mild chronic bibasilar interstitial disease.  Stable thoracic and lumbar spine compression  fractures.   Electronically Signed   By: Marin Olp M.D.   On: 05/01/2015 15:54   Dg Chest Port 1 View  05/02/2015   CLINICAL DATA:  Hypoxia.  Shortness of breath.  EXAM: PORTABLE CHEST - 1 VIEW  COMPARISON:  05/01/2015  FINDINGS: Stable abnormal bilateral interstitial accentuation with minimally improved airspace opacities above the right minor fissure and in the right middle lobe. Thickening of the minor fissure may reflect some pleural fluid on the right. Heart size within normal limits for technique.   The patient is rotated to the right on today's radiograph, reducing diagnostic sensitivity and specificity. There is considerable underlying emphysema.  IMPRESSION: 1. Emphysema with superimposed diffuse interstitial accentuation, and mild airspace opacities in the right upper lobe and right middle lobe. There is no current cardiomegaly, raising the possibilities of atypical pneumonia, noncardiogenic edema, or drug reaction superimposed on underlying emphysema. The right basilar airspace opacities have minimally improved compared to yesterday. 2. Suspected trace right pleural effusion.   Electronically Signed   By: Van Clines M.D.   On: 05/02/2015 08:33    ASSESSMENT / PLAN:  Acute on Chronic Hypoxic and Hypercarbic Respiratory failure in the setting of CAP vs aspiration c/b AECOPD and sedative effect of benzos.  On-going tobacco abuse   Discussion Has chronic resp failure (on chronic O2 and prednisone). Calculated CO2 in 50s at baseline. Now here w/ CAP vs aspiration and acute encephalopathy d/t her infection. A little better today. Got ativan last night to facilitate BIPAP and then was more sedated. I suspect that her WOB is always labored. She needs palliative involvement simply given her advanced lung disease.   Plan D/c NIPPV Scheduled BDs Cont current abx Cont azithro  Scheduled solumedrol -->decrease dose to see if this helps w/ delirium  Wean O2 Hold all sedating meds and narcotics-->haldol if needed as it will not suppress respiratory drive.  No further escalation   Sepsis in setting of CAP vs aspiration. Lactic acid 0.5 Plan Cont IVFs Avoid hypotension Cont abx  Acute encephalopathy  Likely d/t sepsis. Now more anxious and delirious than sedated c/w exam on 8/29 Plan Cont supportive care Stop all sedating meds  Anemia of chronic disease Plan Trend CBC LMWH   Erick Colace ACNP-BC [place] Pager # [phone] OR # [phone] if no  answer   05/03/2015, 9:20 AM

## 2015-05-03 NOTE — Progress Notes (Signed)
Initial Nutrition Assessment  DOCUMENTATION CODES:   Not applicable  INTERVENTION:  - Will order Boost Plus with diet advancement - RD will continue to monitor for needs  NUTRITION DIAGNOSIS:   Inadequate oral intake related to inability to eat as evidenced by NPO status.  GOAL:   Patient will meet greater than or equal to 90% of their needs  MONITOR:   Diet advancement, Weight trends, Labs, I & O's  REASON FOR ASSESSMENT:   Low Braden  ASSESSMENT:   80 y.o. female with history of COPD, chronic hypoxic respiratory failure on home oxygen 3 L/m continuously, ongoing tobacco abuse, lung cancer (LUL) status post radiation-said to be in remission, HTN, HLD, depression, hard of hearing, recent T5 compression fracture status post steroid injection, presented to the [facility] ED on 05/01/15 with above complaints. Patient unable to provide history secondary to her altered mental status. History is obtained from patient's daughter at bedside. Patient was hospitalized 12/25/14-12/28/14 following which she was in rehabilitation for 4 weeks. Has not done really well since then. Recently seen by pulmonology on 03/22/15 for worsening dyspnea, back pain. Underwent CTA chest which was negative for PE but showed T5 compression fracture. On Tuesday she had steroid injection to the back. Since then she has had a rapid decline with complaints of generalized weakness and poor point where she is not getting up from bed, poor appetite, decreased urinary output, urinary incontinence at times, foul smelling urine, cough with intermittent thick biege colored sputum, rattling sound in the chest.  Pt seen for low Braden. BMI indicates overweight status. Pt has been NPO since admission. She has hx of dementia with recent worsening mental decline and sleeping at time of visit. Daughter at bedside and reports all information.  Pt usually has a very good appetite, eats whatever she wants without restrictions,  and does not have chewing or swallowing difficulties; has dentures and sometimes needs meat to be softer. She does drink Boost at home.   For the past 3-4 days PTA pt was refusing meals and drinks and, per daughter, seemed more confused than usual. On one occasion pt was given scrambled eggs and was chewing without swallowing even with prompting and was pocketing the food.   Daughter states no recent weight changes. Chart review indicates 17 lb weight gain in the past 5 months. No muscle or fat wasting noted.  Unable to meet needs. Medications reviewed. Labs reviewed; BUN elevated.    Diet Order:  Diet NPO time specified Except for: Sips with Meds  Skin:  Reviewed, no issues  Last BM:  PTA  Height:   Ht Readings from Last 1 Encounters:  05/02/15 '5\' 4"'$  (1.626 m)    Weight:   Wt Readings from Last 1 Encounters:  05/02/15 149 lb 11.1 oz (67.9 kg)    Ideal Body Weight:  54.54 kg (kg)  BMI:  Body mass index is 25.68 kg/(m^2).  Estimated Nutritional Needs:   Kcal:  1350-1550  Protein:  55-65 grams  Fluid:  2.2 L/day  EDUCATION NEEDS:   No education needs identified at this time     Jarome Matin, RD, LDN Inpatient Clinical Dietitian Pager # [phone] After hours/weekend pager # [phone]

## 2015-05-03 NOTE — Progress Notes (Addendum)
PROGRESS NOTE    Guys TTS:[id number] DOB: 06/14/1935 DOA: 05/01/2015 PCP: Alesia Richards, MD  Outpatient Specialists:  1. Pulmonology: Dr. Baird Lyons 2. Orthopedics: Dr. Nelva Bush 3. Oncology: Dr. Curt Bears  HPI/Brief narrative 80 y.o. female with history of COPD, chronic hypoxic respiratory failure on home oxygen 3 L/m continuously, ongoing tobacco abuse, lung cancer (LUL) status post radiation-said to be in remission, HTN, HLD, depression, hard of hearing, recent T5 compression fracture status post steroid injection, presented to the [facility] ED on 05/01/15 with 4 days history of progressive productive generalized weakness, confusion, poor appetite, cough and urinary incontinence. In the emergency department, temperature 99.59F, hypoxic at 85% on oxygen, creatinine 1.32, WBC 13.9, hemoglobin 11.4 and chest x-ray shows new patchy airspace process in the right mid to lower lung likely a pneumonia. Admitted for CAP, COPD exacerbation, acute on chronic respiratory failure and acute encephalopathy. On 8/29, due to worsening respiratory status patient was transferred from telemetry to stepdown unit. CCM consulted. Some improvement. Remains in stepdown unit. Palliative care consulted for goals of care. Avoid benzodiazepines   Assessment/Plan:  Community-acquired pneumonia (right mid-lower lung)/? Asp PNA 8/29 - >90 days out of SNF - On admission, started empirically on IV Rocephin and azithromycin  - Check sputum culture (? Unable to send), urine Legionella and streptococcal antigen (positive). Blood cultures if she spikes temperature >101F - due to worsened respiratory status on 8/29 and concern for aspiration, antibiotics were changed from IV Rocephin to Zosyn.  COPD exacerbation - Precipitated by pneumonia - IV antibiotics, IV steroids, bronchodilator nebulizations, oxygen support - BiPAP when necessary - Improving. Taper oxygen down to maintain  saturations between 88-92 percent. Discussed with ICU RN. Reduce Solu-Medrol.  Acute on chronic respiratory failure with hypoxia (on home oxygen 3 L/m) & hypercapnia - Precipitated by pneumonia and COPD exacerbation - Daughter has made patient DO NOT RESUSCITATE but would like to try BiPAP if needed - Patient in extremis on 8/29-likely precipitated by Ativan. Transferred to stepdown unit. Obtained stat ABG (hypercapnic, hypoxic acidosis) and chest x-ray. Placed on BiPAP. CCM consulted. - Gradually improving. - Again received Ativan overnight for agitation/help remain on BiPAP. Avoid benzodiazepines. Added Haldol IV when necessary for agitation (QTC OK).  Sepsis - Met sepsis criteria on 05/02/15. Secondary to worsening pneumonia and mental status. Continue IV fluids and antibiotics as above. Lactate normal. Improving.   Dehydration with hyponatremia - Improved after IV fluids.  Acute kidney injury - Brief IV fluids on admission and acute kidney injury resolved.  Acute encephalopathy - Secondary to acute illness complicating underlying possible mild dementia. No focal signs. Treat as above and follow clinically - Mental status worse 8/29 secondary to hypercapnia and sedation. Avoid Ativan. Monitor with management as above  - Use when necessary IV Haldol. Avoid benzodiazepines. Nothing by mouth until consistently alert to take by mouth. - Improving.  Essential hypertension - Controlled. Continue home medications. Overnight received her dose of metoprolol for sinus tachycardia.  Tobacco abuse - Cessation counseling when able  Pre-diabetes - Monitor CBGs and SSI as needed  Lung cancer, status post radiation - Said to be in remission. Supposed to have CT chest next week.  Anemia - Stable  Back pain, status post spinal injection recently - Site looks okay. No neck stiffness. Low index of suspicion for meningitis.   DVT prophylaxis: Lovenox Code Status: DO NOT RESUSCITATE.  Daughter however wishes to try BiPAP if needed  Family Communication: Discussed with patient's daughter at  bedside 8/30.  Disposition Plan: will likely need SNF at discharge. DC when medically stable, possibly in the next 3-4 days   Consultation  PCCM  Procedures  BiPAP   Antibiotics:  IV Rocephin 8/28 > 8/29  IV Zosyn 8/29 >  IV azithromycin 8/28 >  Subjective: Patient confused and unable to provide history. Agitation overnight and received a dose of Ativan. Currently on oxygen 6 L/m via facemask. Patient asking for water to drink.  Objective: Filed Vitals:   05/03/15 0240 05/03/15 0409 05/03/15 0812 05/03/15 0918  BP:    149/89  Pulse:    104  Temp:  98.7 F (37.1 C) 98.3 F (36.8 C)   TempSrc:  Axillary Axillary   Resp:    24  Height:      Weight:      SpO2: 94% 98% 97% 94%    Intake/Output Summary (Last 24 hours) at 05/03/15 1228 Last data filed at 05/03/15 0800  Gross per 24 hour  Intake 499.17 ml  Output    250 ml  Net 249.17 ml   Filed Weights   05/01/15 1903 05/02/15 0830  Weight: 66.5 kg (146 lb 9.7 oz) 67.9 kg (149 lb 11.1 oz)     Exam:  General exam: Ill-looking elderly female propped up in bed. Looks better than she did 8/29 AM. No respiratory distress. Respiratory system: Improved breath sounds bilaterally. Still harsh. Reduced rhonchi.  Cardiovascular system: S1 & S2 heard, RRR. No JVD, murmurs, gallops, clicks or pedal edema. Telemetry: Sinus rhythm . Sinus tachycardia in the 140s early this morning. Gastrointestinal system: Abdomen is nondistended, soft and nontender. Normal bowel sounds heard. Central nervous system: Somnolent, but easily arousable and confused. Restless. No focal neurological deficits. Extremities: Symmetric 5 x 5 power.   Data Reviewed: Basic Metabolic Panel:  Recent Labs Lab 05/01/15 1535 05/02/15 0544 05/03/15 0405  NA 134* 141 143  K 4.8 3.9 4.7  CL 93* 102 102  CO2 37* 32 31  GLUCOSE 80 71 191*  BUN  62* 39* 31*  CREATININE 1.32* 0.79 0.82  CALCIUM 9.9 9.5 10.0   Liver Function Tests:  Recent Labs Lab 05/01/15 1535  AST 30  ALT 24  ALKPHOS 72  BILITOT 0.7  PROT 5.6*  ALBUMIN 2.5*   No results for input(s): LIPASE, AMYLASE in the last 168 hours. No results for input(s): AMMONIA in the last 168 hours. CBC:  Recent Labs Lab 05/01/15 1535 05/02/15 0544 05/03/15 0405  WBC 13.9* 12.6* 14.4*  HGB 11.4* 11.7* 12.3  HCT 37.2 37.8 40.4  MCV 95.9 95.9 96.0  PLT 285 263 310   Cardiac Enzymes: No results for input(s): CKTOTAL, CKMB, CKMBINDEX, TROPONINI in the last 168 hours. BNP (last 3 results) No results for input(s): PROBNP in the last 8760 hours. CBG: No results for input(s): GLUCAP in the last 168 hours.  Recent Results (from the past 240 hour(s))  MRSA PCR Screening     Status: None   Collection Time: 05/02/15  8:21 AM  Result Value Ref Range Status   MRSA by PCR NEGATIVE NEGATIVE Final    Comment:        The GeneXpert MRSA Assay (FDA approved for NASAL specimens only), is one component of a comprehensive MRSA colonization surveillance program. It is not intended to diagnose MRSA infection nor to guide or monitor treatment for MRSA infections.            Studies: Dg Chest 2 View  05/01/2015   CLINICAL  DATA:  Increasing lethargy and anorexia. Altered mental status and decreased PO intake.  EXAM: CHEST  2 VIEW  COMPARISON:  03/22/2015, chest CT 03/23/2015 as well as chest CT 11/23/2014  FINDINGS: Patient slightly rotated to the right. Lungs are adequately inflated and demonstrate patchy airspace opacification over the right mid to lower lung. There is stable left perihilar density scarring over the left upper lobe. Mild chronic bibasilar interstitial disease. No evidence of effusion. Cardiomediastinal silhouette is within normal. There is a stable compression fracture over the mid to upper thoracic spine as well as over the upper lumbar spine.  IMPRESSION:  New patchy airspace process over the right mid to lower lung likely a pneumonia. Chronic left perihilar density in left upper lobe scarring. Mild chronic bibasilar interstitial disease.  Stable thoracic and lumbar spine compression fractures.   Electronically Signed   By: Marin Olp M.D.   On: 05/01/2015 15:54   Dg Chest Port 1 View  05/02/2015   CLINICAL DATA:  Hypoxia.  Shortness of breath.  EXAM: PORTABLE CHEST - 1 VIEW  COMPARISON:  05/01/2015  FINDINGS: Stable abnormal bilateral interstitial accentuation with minimally improved airspace opacities above the right minor fissure and in the right middle lobe. Thickening of the minor fissure may reflect some pleural fluid on the right. Heart size within normal limits for technique.  The patient is rotated to the right on today's radiograph, reducing diagnostic sensitivity and specificity. There is considerable underlying emphysema.  IMPRESSION: 1. Emphysema with superimposed diffuse interstitial accentuation, and mild airspace opacities in the right upper lobe and right middle lobe. There is no current cardiomegaly, raising the possibilities of atypical pneumonia, noncardiogenic edema, or drug reaction superimposed on underlying emphysema. The right basilar airspace opacities have minimally improved compared to yesterday. 2. Suspected trace right pleural effusion.   Electronically Signed   By: Van Clines M.D.   On: 05/02/2015 08:33        Scheduled Meds: . antiseptic oral rinse  7 mL Mouth Rinse q12n4p  . aspirin  81 mg Oral Daily  . [START ON 05/04/2015] atorvastatin  80 mg Oral Once per day on Sun Wed  . azithromycin  500 mg Intravenous Q24H  . chlorhexidine  15 mL Mouth Rinse BID  . doxazosin  8 mg Oral QHS  . enoxaparin (LOVENOX) injection  40 mg Subcutaneous Q24H  . guaiFENesin  1,200 mg Oral BID  . ipratropium-albuterol  3 mL Nebulization Q4H  . losartan  100 mg Oral Daily  . methylPREDNISolone (SOLU-MEDROL) injection  60 mg  Intravenous Q12H  . piperacillin-tazobactam (ZOSYN)  IV  3.375 g Intravenous Q8H  . sodium chloride  3 mL Intravenous Q12H   Continuous Infusions:    Principal Problem:   Community acquired pneumonia Active Problems:   Mixed hyperlipidemia   TOBACCO ABUSE   Essential hypertension   Prediabetes   Dehydration   COPD exacerbation   Acute on chronic respiratory failure with hypoxia   Acute encephalopathy   Acute kidney injury    Time spent: 9 minutes   Ashlee Player, MD, FACP, FHM. [facility] Pager [phone]  If 7PM-7AM, please contact night-coverage [email or web address] Password [email or web address] 05/03/2015, 12:28 PM    LOS: 2 days

## 2015-05-03 NOTE — Care Management Important Message (Signed)
Important Message  Patient Details  Name: Hailey Keith MRN: [id number] Date of Birth: 1935-05-06   Medicare Important Message Given:  Yes-second notification given    Camillo Flaming 05/03/2015, 10:28 [place] Message  Patient Details  Name: Hailey Keith MRN: [id number] Date of Birth: 09/24/1934   Medicare Important Message Given:  Yes-second notification given    Camillo Flaming 05/03/2015, 10:28 AM

## 2015-05-04 ENCOUNTER — Other Ambulatory Visit: Payer: Self-pay | Admitting: Medical Oncology

## 2015-05-04 ENCOUNTER — Inpatient Hospital Stay (HOSPITAL_COMMUNITY): Payer: Medicare Other

## 2015-05-04 ENCOUNTER — Ambulatory Visit: Payer: Self-pay | Admitting: Internal Medicine

## 2015-05-04 DIAGNOSIS — Z7189 Other specified counseling: Secondary | ICD-10-CM

## 2015-05-04 DIAGNOSIS — J189 Pneumonia, unspecified organism: Secondary | ICD-10-CM

## 2015-05-04 DIAGNOSIS — Z515 Encounter for palliative care: Secondary | ICD-10-CM

## 2015-05-04 DIAGNOSIS — J9621 Acute and chronic respiratory failure with hypoxia: Secondary | ICD-10-CM

## 2015-05-04 DIAGNOSIS — M546 Pain in thoracic spine: Secondary | ICD-10-CM

## 2015-05-04 LAB — LEGIONELLA ANTIGEN, URINE

## 2015-05-04 MED ORDER — CETYLPYRIDINIUM CHLORIDE 0.05 % MT LIQD
7.0000 mL | Freq: Two times a day (BID) | OROMUCOSAL | Status: DC
  Administered 2015-05-04 – 2015-05-06 (×4): 7 mL via OROMUCOSAL

## 2015-05-04 MED ORDER — PREDNISONE 20 MG PO TABS
40.0000 mg | ORAL_TABLET | Freq: Every day | ORAL | Status: DC
  Administered 2015-05-04 – 2015-05-06 (×3): 40 mg via ORAL
  Filled 2015-05-04 (×3): qty 2

## 2015-05-04 MED ORDER — OXYCODONE-ACETAMINOPHEN 7.5-325 MG PO TABS
1.0000 | ORAL_TABLET | Freq: Four times a day (QID) | ORAL | Status: DC | PRN
  Administered 2015-05-04 – 2015-05-06 (×7): 1 via ORAL
  Filled 2015-05-04 (×7): qty 1

## 2015-05-04 MED ORDER — AMOXICILLIN-POT CLAVULANATE 875-125 MG PO TABS
1.0000 | ORAL_TABLET | Freq: Two times a day (BID) | ORAL | Status: DC
  Administered 2015-05-04 – 2015-05-06 (×5): 1 via ORAL
  Filled 2015-05-04 (×5): qty 1

## 2015-05-04 MED ORDER — FUROSEMIDE 10 MG/ML IJ SOLN
40.0000 mg | Freq: Once | INTRAMUSCULAR | Status: AC
  Administered 2015-05-04: 40 mg via INTRAVENOUS
  Filled 2015-05-04: qty 4

## 2015-05-04 NOTE — Progress Notes (Signed)
RT provided Flutter device with instruction.

## 2015-05-04 NOTE — Progress Notes (Signed)
Patient ID: Hailey Keith, female   DOB: 11/18/1934, 80 y.o.   MRN: [id number]  [facility] PROGRESS NOTE  Hailey Keith RFF:[id number] DOB: [birth year] DOA: 05/01/2015 PCP: Alesia Richards, MD   Brief narrative:    80 y.o. female with history of COPD, chronic hypoxic respiratory failure on home oxygen 3 L/m continuously, ongoing tobacco abuse, lung cancer (LUL) status post radiation-said to be in remission, HTN, HLD, depression, hard of hearing, recent T5 compression fracture status post steroid injection, presented to the [facility] ED on 05/01/15 with 4 days history of progressive generalized weakness, confusion, poor appetite, cough and urinary incontinence.  In the emergency department, T 99.90F, hypoxic at 85% on oxygen, creatinine 1.32, WBC 13.9, hemoglobin 11.4 and chest x-ray with new patchy airspace process in the right mid to lower lung likely a pneumonia. Admitted for CAP, COPD exacerbation, acute on chronic respiratory failure and acute encephalopathy. On 8/29, due to worsening respiratory status patient was transferred from telemetry to stepdown unit. CCM consulted. Palliative care consulted for goals of care. Avoid benzodiazepines  Assessment/Plan:    Community-acquired pneumonia (right mid-lower lung)/? Asp PNA 8/29, strep pneumo + - On admission, started empirically on IV Rocephin and azithromycin  - per PCCM, will change to Augmentin 8/31 - continue scheduled BD's  - agree with d/c azithro (had 4 days of levaquin prior to admit so has had adequate atypical coverage) - Change solumedrol to pred 40 mg PO QD; wean by 10 mg every 4d until back to baseline - IS and flutter  - smoking cessation already provided by PCCM team and Dr. Algis Liming  - Hold all sedating meds and narcotics - -> haldol if needed as it will not suppress respiratory drive.   COPD exacerbation - Precipitated by pneumonia - IV antibiotics, IV steroids, bronchodilator  nebulizations, oxygen support - taper IV steroids   Acute on chronic respiratory failure with hypoxia (on home oxygen 3 L/m) & hypercapnia - Precipitated by pneumonia and COPD exacerbation - improving, management as noted above   Sepsis - Met sepsis criteria on 05/02/15. Secondary to worsening pneumonia and mental status - ABX as noted above - sepsis etiology resolving   Dehydration with hyponatremia - Improved after IV fluids.  Acute kidney injury - resolved with IVF   Acute encephalopathy - Secondary to acute illness complicating underlying possible mild dementia. No focal signs. Treat as above and follow clinically - Mental status worse 8/29 secondary to hypercapnia and sedation. Avoid Ativan. Monitor with management as above  - Use when necessary IV Haldol. Avoid benzodiazepines.   Essential hypertension - continue Losartan  - add hydralazine as needed for better BP control   Tobacco abuse - Cessation counseling done   Pre-diabetes - Monitor CBGs and SSI as needed  Lung cancer, status post radiation - Said to be in remission. Supposed to have CT chest next week.  Back pain, status post spinal injection recently - Site looks okay. No neck stiffness. Low index of suspicion for meningitis.  DVT prophylaxis - Lovenox SQ  Code Status: DNR Family Communication:  plan of care discussed with the patient Disposition Plan: To be determined   IV access:  Peripheral IV  Procedures and diagnostic studies:    Dg Chest 2 View 05/01/2015    New patchy airspace process over the right mid to lower lung likely a pneumonia. Chronic left perihilar density in left upper lobe scarring. Mild chronic bibasilar interstitial disease.  Stable thoracic and lumbar spine compression fractures.  Dg Chest Port 1 View 05/04/2015  Mildly worsened interstitial and alveolar opacities bilaterally.     Dg Chest Port 1 View 05/02/2015  Emphysema with superimposed diffuse interstitial  accentuation, and mild airspace opacities in the right upper lobe and right middle lobe. There is no current cardiomegaly, raising the possibilities of atypical pneumonia, noncardiogenic edema, or drug reaction superimposed on underlying emphysema. The right basilar airspace opacities have minimally improved compared to yesterday. 2. Suspected trace right pleural effusion.    Medical Consultants:  PCCM PCT  Other Consultants:  PT  IAnti-Infectives:    IV Rocephin 8/28 --> 8/29  IV Zosyn 8/29 --> 8/31  IV azithromycin 8/28 --> 8/31  Augmentin 8/31 -->  Faye Ramsay, MD  [facility] Pager [phone]  If 7PM-7AM, please contact night-coverage [email or web address] Password [facility] 05/04/2015, 4:41 PM   LOS: 3 days   HPI/Subjective: No events overnight.   Objective: Filed Vitals:   05/04/15 0900 05/04/15 1000 05/04/15 1100 05/04/15 1200  BP: 157/79 181/68    Pulse: 110 107    Temp:    97.8 F (36.6 C)  TempSrc:    Oral  Resp: 20 23    Height:      Weight:      SpO2: 93% 93% 98%     Intake/Output Summary (Last 24 hours) at 05/04/15 1641 Last data filed at 05/04/15 1000  Gross per 24 hour  Intake  502.5 ml  Output   1760 ml  Net -1257.5 ml    Exam:   General:  Pt is alert, not in acute distress  Cardiovascular: Regular rate and rhythm,  no rubs, no gallops  Respiratory: Clear to auscultation bilaterally, no wheezing, diminished breath sounds at bases   Abdomen: Soft, non tender, non distended, bowel sounds present, no guarding  Data Reviewed: Basic Metabolic Panel:  Recent Labs Lab 05/01/15 1535 05/02/15 0544 05/03/15 0405  NA 134* 141 143  K 4.8 3.9 4.7  CL 93* 102 102  CO2 37* 32 31  GLUCOSE 80 71 191*  BUN 62* 39* 31*  CREATININE 1.32* 0.79 0.82  CALCIUM 9.9 9.5 10.0   Liver Function Tests:  Recent Labs Lab 05/01/15 1535  AST 30  ALT 24  ALKPHOS 72  BILITOT 0.7  PROT 5.6*  ALBUMIN 2.5*   CBC:  Recent Labs Lab 05/01/15 1535  05/02/15 0544 05/03/15 0405  WBC 13.9* 12.6* 14.4*  HGB 11.4* 11.7* 12.3  HCT 37.2 37.8 40.4  MCV 95.9 95.9 96.0  PLT 285 263 310    Recent Results (from the past 240 hour(s))  MRSA PCR Screening     Status: None   Collection Time: 05/02/15  8:21 AM  Result Value Ref Range Status   MRSA by PCR NEGATIVE NEGATIVE Final    Comment:        The GeneXpert MRSA Assay (FDA approved for NASAL specimens only), is one component of a comprehensive MRSA colonization surveillance program. It is not intended to diagnose MRSA infection nor to guide or monitor treatment for MRSA infections.   Culture, sputum-assessment     Status: None   Collection Time: 05/03/15  4:35 PM  Result Value Ref Range Status   Specimen Description Expect. Sput  Final   Special Requests NONE  Final   Sputum evaluation   Final    THIS SPECIMEN IS ACCEPTABLE. RESPIRATORY CULTURE REPORT TO FOLLOW.   Report Status 05/03/2015 FINAL  Final  Culture, respiratory (NON-Expectorated)     Status: None (Preliminary result)  Collection Time: 05/03/15  4:35 PM  Result Value Ref Range Status   Specimen Description SPUTUM  Final   Special Requests NONE  Final   Gram Stain   Final    FEW WBC PRESENT,BOTH PMN AND MONONUCLEAR RARE SQUAMOUS EPITHELIAL CELLS PRESENT NO ORGANISMS SEEN Performed at [facility]    Culture PENDING  Incomplete   Report Status PENDING  Incomplete     Scheduled Meds: . amoxicillin-clavulanate  1 tablet Oral Q12H  . antiseptic oral rinse  7 mL Mouth Rinse q12n4p  . aspirin  81 mg Oral Daily  . atorvastatin  80 mg Oral Once per day on Sun Wed  . chlorhexidine  15 mL Mouth Rinse BID  . doxazosin  8 mg Oral QHS  . enoxaparin (LOVENOX) injection  40 mg Subcutaneous Q24H  . guaiFENesin  1,200 mg Oral BID  . ipratropium-albuterol  3 mL Nebulization Q4H  . losartan  100 mg Oral Daily  . predniSONE  40 mg Oral Q breakfast  . sodium chloride  3 mL Intravenous Q12H   Continuous Infusions:

## 2015-05-04 NOTE — Progress Notes (Signed)
Pt currently on 3 LPM [place] and tolerating well at this time.  Pt appears in no distress at this time, RT will hold BIPAP.  RT to monitor and assess as needed.

## 2015-05-04 NOTE — Consult Note (Signed)
Consultation Note Date: 05/04/2015   Patient Name: Hailey Keith  DOB: [birth year]  MRN: [id number]  Age / Sex: 80 y.o., female   PCP: Unk Pinto, MD Referring Physician: Theodis Blaze, MD  Reason for Consultation: Establishing goals of care, Non pain symptom management, Pain control and Psychosocial/spiritual support  Palliative Care Assessment and Plan Summary of Established Goals of Care and Medical Treatment Preferences    Palliative Care Discussion Held Today:    This NP Wadie Lessen reviewed medical records, received report from team, assessed the patient and then meet at the patient's bedside along with her daughter Barnett Applebaum to discuss diagnosis,  prognosis, GOC, EOL wishes, disposition and options.  A detailed discussion was had today regarding advanced directives.  Concepts specific to code status, artifical feeding and hydration, continued IV antibiotics and rehospitalization was had.  The difference between a aggressive medical intervention path  and a palliative comfort care path for this patient at this time was had.  Values and goals of care important to patient and family were attempted to be elicited.  Concept of Palliative Care was discussed   Questions and concerns addressed.  Family encouraged to call with questions or concerns.  PMT will continue to support holistically.   Contacts/Participants in Discussion: Primary Decision Maker: Daughter/ Willia Craze   Goals of Care/Code Status/Advance Care Planning:   Code Status:  DNR/DNI                                     BiPap -yes  Antibiotics: yes  Diagnostics: yes  Treat the treatable, watchful waiting and hopeful for improvement.     Symptom Management:    Pain:  Percocet 7.5-325 mg every 6 hrs prn  Dyspnea:  Psycho-social/Spiritual:   Support System: Daughter and family   Prognosis:   Pending outcomes  Discharge Planning:  Pending outcomes, but daughter leans to taking her mother home  if possible.  Will depend on her response to treatment      Chief Complaint: weakness, increased BOB, back pain  History of Present Illness:  80 y.o. female with history of COPD, chronic hypoxic respiratory failure on home oxygen 3 L/m continuously, ongoing tobacco abuse, lung cancer (LUL) status post radiation-stable at this time, HTN, HLD, depression, hard of hearing, recent T5 compression fracture status post steroid injection, presented to the [facility] ED on 05/01/15 with above complaints.   Patient unable to provide history secondary to her altered mental status. History is obtained from patient's daughter at bedside.   Patient was hospitalized 12/25/14-12/28/14 following which she was in rehabilitation for 4 weeks. Has not done really well since then.   Recently seen by pulmonology on 03/22/15 for worsening dyspnea, back pain. Underwent CTA chest which was negative for PE but showed T5 compression fracture. On Tuesday she had steroid injection to the back. Since then she has had a rapid decline with complaints of generalized weakness and poor point where she is not getting up from bed, poor appetite, decreased urinary output, urinary incontinence at times, foul smelling urine, cough with intermittent thick biege colored sputum, rattling sound in the chest, no worsening of chronic dyspnea, no reported fever or chills, worsening confusion but no chest pain.   Daughter looked up on the Internet and was concerned that she might be having a urinary tract infection and started her on Levaquin that she had leftover at home. Patient  completed 4 days of levofloxacin without improvement in her condition and hence presented to the ED. In the emergency department, temperature 99.28F, hypoxic at 85% on oxygen, creatinine 1.32, WBC 13.9, hemoglobin 11.4 and chest x-ray shows new patchy airspace process in the right mid to lower lung likely a pneumonia. Hospitalist admission was requested.  Weak and  frail, family faced with advanced directive decisions and anticipatory care needs   Primary Diagnoses  Present on Admission:  . Dehydration . Community acquired pneumonia . COPD exacerbation . Acute on chronic respiratory failure with hypoxia . Acute encephalopathy . Essential hypertension . Mixed hyperlipidemia . Prediabetes . TOBACCO ABUSE . Acute kidney injury  Palliative Review of Systems:    -"terrible" back pain   I have reviewed the medical record, interviewed the patient and family, and examined the patient. The following aspects are pertinent.  Past Medical History  Diagnosis Date  . Hypertension   . Dyslipidemia   . Hard of hearing   . Hx of radiation therapy 06/18/13- 07/23/13    LUL lung primary, 7020 cGy 26 sessions  . 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 Skin cancer     basal cell  . COPD (chronic obstructive pulmonary disease)   . prediabetes    Social History   Social History  . Marital Status: Single    Spouse Name: N/A  . Number of Children: 1  . Years of Education: N/A   Occupational History  . retired    Social History Main Topics  . Smoking status: Current Every Day Smoker -- 0.50 packs/day for 60 years    Types: Cigarettes  . Smokeless tobacco: None     Comment: 06/05/13 currently 1/2 PPD, trying to quit  . Alcohol Use: Yes     Comment: occ wine, 2 drinks nightly  . Drug Use: No  . Sexual Activity: Not Asked   Other Topics Concern  . None   Social History Narrative   Family History  Problem Relation Age of Onset  . Hypertension    . Lung cancer Sister     tx w/radiation, met to stomach  . Pneumonia Mother     Deceased  . Heart attack Father     Deceased  . Cancer Sister     "back of neck"   Scheduled Meds: . antiseptic oral rinse  7 mL Mouth Rinse q12n4p  . aspirin  81 mg Oral Daily  . atorvastatin  80 mg Oral Once per day on Sun Wed  . azithromycin  500 mg Intravenous Q24H  . chlorhexidine  15 mL Mouth Rinse  BID  . doxazosin  8 mg Oral QHS  . enoxaparin (LOVENOX) injection  40 mg Subcutaneous Q24H  . guaiFENesin  1,200 mg Oral BID  . ipratropium-albuterol  3 mL Nebulization Q4H  . losartan  100 mg Oral Daily  . methylPREDNISolone (SOLU-MEDROL) injection  60 mg Intravenous Q12H  . piperacillin-tazobactam (ZOSYN)  IV  3.375 g Intravenous Q8H  . sodium chloride  3 mL Intravenous Q12H   Continuous Infusions: . sodium chloride 50 mL/hr at 05/03/15 1358   PRN Meds:.acetaminophen **OR** acetaminophen, albuterol, guaiFENesin-dextromethorphan, haloperidol lactate, hydrALAZINE, senna-docusate Medications Prior to Admission:  Prior to Admission medications   Medication Sig Start Date End Date Taking? Authorizing Provider  aspirin 81 MG tablet Take 81 mg by mouth daily.   Yes Historical Provider, MD  atorvastatin (LIPITOR) 80 MG tablet Take 80 mg by mouth 2 (two) times a week.  WED AND Sun   Yes Historical Provider, MD  citalopram (CELEXA) 40 MG tablet TAKE 1 TABLET BY MOUTH DAILY FOR MOOD 12/24/14  Yes Vicie Mutters, PA-C  doxazosin (CARDURA) 8 MG tablet TAKE 1 TABLET AT BEDTIME 03/21/15  Yes Courtney Forcucci, PA-C  guaifenesin (HUMIBID E) 400 MG TABS tablet Take 400 mg by mouth 2 (two) times daily.   Yes Historical Provider, MD  ipratropium-albuterol (DUONEB) 0.5-2.5 (3) MG/3ML SOLN TAKE 3 MLS BY NEBULIZATION 4 (FOUR) TIMES DAILY. 04/18/15  Yes Vicie Mutters, PA-C  levofloxacin (LEVAQUIN) 500 MG tablet TAKE 1 TABLET (500 MG TOTAL) BY MOUTH DAILY. FOR LUNG INFECTION 04/18/15  Yes Vicie Mutters, PA-C  losartan (COZAAR) 100 MG tablet Take 100 mg by mouth daily.   Yes Historical Provider, MD  Oxycodone HCl 10 MG TABS Take 10 mg by mouth every 4 (four) hours as needed (for pain).   Yes Historical Provider, MD  oxyCODONE-acetaminophen (PERCOCET/ROXICET) 5-325 MG per tablet Take two tablets by mouth every four hours as needed for pain. Do not exceed 4gms of Tylenol in 24 hours 03/28/15  Yes Vicie Mutters, PA-C    predniSONE (DELTASONE) 5 MG tablet Take 1 tablet (5 mg total) by mouth 2 (two) times daily with a meal. 01/03/15  Yes Nishant Dhungel, MD  PROAIR HFA 108 (90 BASE) MCG/ACT inhaler 1 TO 2 INHALATIONS EVERY 4 HOURS AS NEEDED FOR RESCUE ASTHMA 04/18/15  Yes Vicie Mutters, PA-C  Vitamin D, Ergocalciferol, (DRISDOL) 50000 UNITS CAPS capsule TAKE ONE CAPSULE EVERY DAY Patient taking differently: TAKE ONE CAPSULE TWICE PER WEEK on Sunday's and Wednesday's 03/01/15  Yes Vicie Mutters, PA-C   Allergies  Allergen Reactions  . Paxil [Paroxetine Hcl] Other (See Comments)    UNKNOWN   CBC:    Component Value Date/Time   WBC 14.4* 05/03/2015 0405   WBC 9.8 01/05/2015   WBC 11.8* 11/23/2014 1441   HGB 12.3 05/03/2015 0405   HGB 14.3 11/23/2014 1441   HCT 40.4 05/03/2015 0405   HCT 44.2 11/23/2014 1441   PLT 310 05/03/2015 0405   PLT 210 11/23/2014 1441   MCV 96.0 05/03/2015 0405   MCV 92.7 11/23/2014 1441   NEUTROABS 9.8* 02/01/2015 1230   NEUTROABS 8.1* 11/23/2014 1441   LYMPHSABS 1.1 02/01/2015 1230   LYMPHSABS 2.0 11/23/2014 1441   MONOABS 0.8 02/01/2015 1230   MONOABS 1.2* 11/23/2014 1441   EOSABS 0.1 02/01/2015 1230   EOSABS 0.4 11/23/2014 1441   BASOSABS 0.0 02/01/2015 1230   BASOSABS 0.0 11/23/2014 1441   Comprehensive Metabolic Panel:    Component Value Date/Time   NA 143 05/03/2015 0405   NA 142 01/05/2015   NA 143 11/23/2014 1441   K 4.7 05/03/2015 0405   K 4.8 11/23/2014 1441   CL 102 05/03/2015 0405   CO2 31 05/03/2015 0405   CO2 28 11/23/2014 1441   BUN 31* 05/03/2015 0405   BUN 16 01/05/2015   BUN 15.4 11/23/2014 1441   CREATININE 0.82 05/03/2015 0405   CREATININE 0.68 02/01/2015 1230   CREATININE 0.7 01/05/2015   CREATININE 0.7 11/23/2014 1441   GLUCOSE 191* 05/03/2015 0405   GLUCOSE 89 11/23/2014 1441   CALCIUM 10.0 05/03/2015 0405   CALCIUM 9.7 11/23/2014 1441   AST 30 05/01/2015 1535   AST 24 11/23/2014 1441   ALT 24 05/01/2015 1535   ALT 18 11/23/2014  1441   ALKPHOS 72 05/01/2015 1535   ALKPHOS 72 11/23/2014 1441   BILITOT 0.7 05/01/2015 1535   BILITOT  0.48 11/23/2014 1441   PROT 5.6* 05/01/2015 1535   PROT 6.3* 11/23/2014 1441   ALBUMIN 2.5* 05/01/2015 1535   ALBUMIN 3.6 11/23/2014 1441    Physical Exam:  Vital Signs: BP 162/59 mmHg  Pulse 106  Temp(Src) 98.8 F (37.1 C) (Axillary)  Resp 29  Ht $R'5\' 4"'bZ$  (1.626 m)  Wt 67.9 kg (149 lb 11.1 oz)  BMI 25.68 kg/m2  SpO2 98% SpO2: SpO2: 98 % O2 Device: O2 Device: Venturi Mask O2 Flow Rate: O2 Flow Rate (L/min): 10 L/min Intake/output summary:  Intake/Output Summary (Last 24 hours) at 05/04/15 0705 Last data filed at 05/04/15 0430  Gross per 24 hour  Intake  902.5 ml  Output   1160 ml  Net -257.5 ml   LBM: Last BM Date: 05/03/15 Baseline Weight: Weight: 66.5 kg (146 lb 9.7 oz) Most recent weight: Weight: 67.9 kg (149 lb 11.1 oz)  Exam Findings:   General: chronically ill appearing, dyspneic  HEENT: dry buccal membranes, no exudate CVS: tacycardic Resp: tachepneic, decreased BS trhoughout  Abd: soft NT +BS Extrem: without edema Skin: warm and dry Neuro: lethargic, but follows commands           Palliative Performance Scale: 30 %                Additional Data Reviewed: Recent Labs     05/02/15  0544  05/03/15  0405  WBC  12.6*  14.4*  HGB  11.7*  12.3  PLT  263  310  NA  141  143  BUN  39*  31*  CREATININE  0.79  0.82    Time In: 1200 Time Out: 1330 Time Total:  90 min  Greater than 50%  of this time was spent counseling and coordinating care related to the above assessment and plan.   Signed by: Wadie Lessen, NP  Knox Royalty, NP  05/04/2015, 7:05 AM  Please contact Palliative Medicine Team phone at [phone] for questions and concerns.   See AMION for contact information

## 2015-05-04 NOTE — Progress Notes (Signed)
Name: Hailey Keith MRN: [id number] DOB: 15-May-1935    ADMISSION DATE:  05/01/2015 CONSULTATION DATE:  8/29  REFERRING MD :  Algis Liming  CHIEF COMPLAINT:  Acute on chronic respiratory failure   BRIEF PATIENT DESCRIPTION:   This is a 80 year old female w/ chronic respiratory failure in the setting of COPD (on home O2 and pred chronically). Also has h/o LUL lung cancer s/p XRT (now on observational therapy). Sill smokes. Most recently her primary complaints have been related to T5 compression fracture that has been responsible for significant pain. She was admitted w/ working dx of acute on chronic respiratory failure (sats 85% on oxygen), CAP, dehydration and Acute encephalopathy. Apparently while awaiting transfer to the SDU had significant anxiety. Med record review shows two separate doses of IV ativan. PCCM asked to see on 8/29 after pt found to be more lethargic w/ PCO2 in 70s.  SIGNIFICANT EVENTS    STUDIES:    SUBJECTIVE:  A little less confused.    VITAL SIGNS: Temp:  [98.2 F (36.8 C)-99.8 F (37.7 C)] 98.8 F (37.1 C) (08/31 0400) Pulse Rate:  [86-123] 86 (08/31 0800) Resp:  [23-29] 23 (08/31 0800) BP: (149-219)/(59-89) 219/86 mmHg (08/31 0800) SpO2:  [90 %-98 %] 98 % (08/31 0808) FiO2 (%):  [45 %-55 %] 45 % (08/31 0348) 8 liters (aerosol mask)  PHYSICAL EXAMINATION: General:  Chronically ill appearing female, very hard of hearing. Less confused Neuro:  More awake, oriented x3.  HEENT:  MM dry, neck veins flat, very poor dentition Cardiovascular:  rrr w/out murmur  Lungs: Marked accessory muscle use, prolonged exp wheeze  Abdomen:  Soft, non-tender. + bowel sounds  Musculoskeletal:  Intact  Skin:  Scattered areas of ecchymosis    Recent Labs Lab 05/01/15 1535 05/02/15 0544 05/03/15 0405  NA 134* 141 143  K 4.8 3.9 4.7  CL 93* 102 102  CO2 37* 32 31  BUN 62* 39* 31*  CREATININE 1.32* 0.79 0.82  GLUCOSE 80 71 191*    Recent Labs Lab  05/01/15 1535 05/02/15 0544 05/03/15 0405  HGB 11.4* 11.7* 12.3  HCT 37.2 37.8 40.4  WBC 13.9* 12.6* 14.4*  PLT 285 263 310   ABG    Component Value Date/Time   PHART 7.252* 05/02/2015 0750   PCO2ART 71.5* 05/02/2015 0750   PO2ART 54.5* 05/02/2015 0750   HCO3 30.4* 05/02/2015 0750   TCO2 28.4 05/02/2015 0750   O2SAT 80.9 05/02/2015 0750    Recent Labs Lab 05/01/15 1535 05/01/15 1735 05/02/15 0544 05/03/15 0405  WBC 13.9*  --  12.6* 14.4*  LATICACIDVEN  --  0.50  --   --     Dg Chest Port 1 View  05/04/2015   CLINICAL DATA:  Pneumonia  EXAM: PORTABLE CHEST - 1 VIEW  COMPARISON:  05/02/2015  FINDINGS: There is mild worsening of interstitial and alveolar opacities. No large effusion is evident. No pneumothorax is evident.  IMPRESSION: Mildly worsened interstitial and alveolar opacities bilaterally.   Electronically Signed   By: Andreas Newport M.D.   On: 05/04/2015 05:35  aeration a little worse. Mostly basilar atx, but alveolar changes could represent mild ALI  ASSESSMENT / PLAN:  Acute on Chronic Hypoxic and Hypercarbic Respiratory failure in the setting of CAP vs aspiration c/b AECOPD and sedative effect of benzos.  On-going tobacco abuse   Discussion Has chronic resp failure (on chronic O2 and prednisone). Calculated CO2 in 50s at baseline. Now here w/ CAP vs aspiration and  acute encephalopathy d/t her infection. Clinically improved. CXR lagging behind and a little worse but don't think that represents her current status. We are weaning O2 and mobilizing.  Plan Scheduled BDs Change zosyn to Augmentin Dc azithro (had 4 days of levaquin prior to admit so has had adequate atypical coverage) Change solumedrol to pred 40; wean by 10 mg every 4d until back to baseline IV lasix X1 Encourage IS and flutter Smoking cessation (reviewed today and must continue daily) Wean O2/ mobilize  Hold all sedating meds and narcotics-->haldol if needed as it will not suppress  respiratory drive.  No further escalation   Acute encephalopathy (multifactorial: sepsis and meds)-->improving  Plan Cont supportive care Stopped all sedating meds  Anemia of chronic disease Plan Trend CBC LMWH  We will s/o. She needs to f/u in our office w/in 5-7 d of d/c. Can call Marni Griffon to assist w/ this.   Erick Colace ACNP-BC [place] Pager # [phone] OR # [phone] if no answer   05/04/2015, 8:37 AM

## 2015-05-04 NOTE — Plan of Care (Signed)
Problem: Phase I Progression Outcomes Goal: OOB as tolerated unless otherwise ordered Outcome: Progressing Pt OOB this afternoon to chair, dyspnea noted upon exertion. O2 sats dropped to low 80's but did not maintain.

## 2015-05-04 NOTE — Telephone Encounter (Addendum)
Per Julien Nordmann I told daughter re scan results from July and to keep sept appt. or call if she needs to r/s. He will set up future scan at her sept appt.I left her a message that pt does not need to come tomorrow for labs.

## 2015-05-05 ENCOUNTER — Other Ambulatory Visit: Payer: Medicare Other

## 2015-05-05 ENCOUNTER — Inpatient Hospital Stay (HOSPITAL_COMMUNITY): Payer: Medicare Other

## 2015-05-05 ENCOUNTER — Ambulatory Visit (HOSPITAL_COMMUNITY): Payer: Medicare Other

## 2015-05-05 ENCOUNTER — Telehealth: Payer: Self-pay | Admitting: Internal Medicine

## 2015-05-05 LAB — CBC
HEMATOCRIT: 35.5 % — AB (ref 36.0–46.0)
Hemoglobin: 11.6 g/dL — ABNORMAL LOW (ref 12.0–15.0)
MCH: 30.6 pg (ref 26.0–34.0)
MCHC: 32.7 g/dL (ref 30.0–36.0)
MCV: 93.7 fL (ref 78.0–100.0)
Platelets: 269 10*3/uL (ref 150–400)
RBC: 3.79 MIL/uL — ABNORMAL LOW (ref 3.87–5.11)
RDW: 14.2 % (ref 11.5–15.5)
WBC: 14 10*3/uL — AB (ref 4.0–10.5)

## 2015-05-05 LAB — BASIC METABOLIC PANEL
ANION GAP: 5 (ref 5–15)
BUN: 23 mg/dL — ABNORMAL HIGH (ref 6–20)
CALCIUM: 9 mg/dL (ref 8.9–10.3)
CO2: 35 mmol/L — ABNORMAL HIGH (ref 22–32)
CREATININE: 0.57 mg/dL (ref 0.44–1.00)
Chloride: 94 mmol/L — ABNORMAL LOW (ref 101–111)
Glucose, Bld: 113 mg/dL — ABNORMAL HIGH (ref 65–99)
Potassium: 3.6 mmol/L (ref 3.5–5.1)
SODIUM: 134 mmol/L — AB (ref 135–145)

## 2015-05-05 MED ORDER — ASPIRIN EC 81 MG PO TBEC
81.0000 mg | DELAYED_RELEASE_TABLET | Freq: Every day | ORAL | Status: DC
  Administered 2015-05-05 – 2015-05-06 (×2): 81 mg via ORAL
  Filled 2015-05-05: qty 1

## 2015-05-05 MED ORDER — GADOBENATE DIMEGLUMINE 529 MG/ML IV SOLN
15.0000 mL | Freq: Once | INTRAVENOUS | Status: AC | PRN
  Administered 2015-05-05: 13 mL via INTRAVENOUS

## 2015-05-05 MED ORDER — BOOST PLUS PO LIQD
237.0000 mL | Freq: Three times a day (TID) | ORAL | Status: DC
  Administered 2015-05-06: 237 mL via ORAL
  Filled 2015-05-05 (×6): qty 237

## 2015-05-05 NOTE — Progress Notes (Signed)
Physical Therapy Treatment Patient Details Name: Hailey Keith MRN: [id number] DOB: [birth year] Today's Date: 05/05/2015    History of Present Illness 80 y.o. female with history of COPD, chronic hypoxic respiratory failure on home oxygen 3 L/m continuously, ongoing tobacco abuse, lung cancer (LUL) status post radiation-said to be in remission, HTN, HLD, depression, hard of hearing, recent T5 compression fracture status post steroid injection, presented to the [facility] ED on 05/01/15 with CAP.     PT Comments    Pt in bed on 3 lts with daughter in room.  Pt required MAX encouragement to get OOB due to fatigue level and back pain.  Pt was able to self rise and transition to sitting EOB with Min Assist.  Assisted with amb a limited distance + 2 assist for safety as daughter followed with recliner.  Pt remained on 3 lts with sats decreasing to 89%.  Education on purse lip breathing and rest break required after amb 20 feet.  MAX c/o mid back pain.  Assisted with amb another 20 feet then back to bed.    Follow Up Recommendations  SNF (evaluating LPT rec SNF however daughter indicated home)     Equipment Recommendations       Recommendations for Other Services       Precautions / Restrictions Precautions Precautions: Fall Precaution Comments: monitor O2 Restrictions Weight Bearing Restrictions: No    Mobility  Bed Mobility Overal bed mobility: Needs Assistance Bed Mobility: Supine to Sit;Sit to Supine     Supine to sit: Min assist     General bed mobility comments: with MAX encouragement and increased time pt was able to self rise and sit EOB with Min Assist.    Transfers Overall transfer level: Needs assistance Equipment used: Rolling walker (2 wheeled) Transfers: Sit to/from Stand Sit to Stand: Min assist;+2 safety/equipment         General transfer comment: 50% VC's on proper hand placement and MAX encouragement, pt was able to self rise off bed and sit in  recliner a brief rest period.  Back pain inhibited sitting up a long period.   Ambulation/Gait Ambulation/Gait assistance: Min assist;Mod assist;+2 safety/equipment Ambulation Distance (Feet): 40 Feet (20 feet x 2) Assistive device: Rolling walker (2 wheeled) Gait Pattern/deviations: Step-to pattern;Narrow base of support;Shuffle;Trunk flexed Gait velocity: decreased   General Gait Details: amb on 3 lts lowest sat 89% and HR 105 with 3/4 DOE.  Max VC's on purse lip breathing.  Limited amb distance due to dyspnea.   Stairs            Wheelchair Mobility    Modified Rankin (Stroke Patients Only)       Balance                                    Cognition Arousal/Alertness: Awake/alert Behavior During Therapy: WFL for tasks assessed/performed                        Exercises      General Comments        Pertinent Vitals/Pain Pain Assessment: Faces Faces Pain Scale: Hurts whole lot Pain Location: mid back Pain Descriptors / Indicators: Constant;Discomfort;Grimacing Pain Intervention(s): Monitored during session;Repositioned    Home Living                      Prior Function  PT Goals (current goals can now be found in the care plan section) Progress towards PT goals: Progressing toward goals    Frequency  Min 3X/week    PT Plan      Co-evaluation             End of Session Equipment Utilized During Treatment: Gait belt;Oxygen Activity Tolerance: Patient limited by fatigue;Treatment limited secondary to medical complications (Comment) (COPD) Patient left: in bed;with call bell/phone within reach;with family/visitor present     Time: 1435-1500 PT Time Calculation (min) (ACUTE ONLY): 25 min  Charges:  $Gait Training: 8-22 mins $Therapeutic Activity: 8-22 mins                    G Codes:      Rica Koyanagi  PTA [facility]  Acute  Rehab Pager      [phone]

## 2015-05-05 NOTE — Telephone Encounter (Signed)
Per pof cx lab....done..the patient is in the hospital

## 2015-05-05 NOTE — Progress Notes (Addendum)
Patient ID: Hailey Keith, female   DOB: Jun 21, 1935, 80 y.o.   MRN: [id number]  [facility] PROGRESS NOTE  Hailey Keith VQK:[id number] DOB: 1935-08-06 DOA: 05/01/2015 PCP: Nadean Corwin, MD   Brief narrative:    80 y.o. female with history of COPD, chronic hypoxic respiratory failure on home oxygen 3 L/m continuously, ongoing tobacco abuse, lung cancer (LUL) status post radiation-said to be in remission, HTN, HLD, depression, hard of hearing, recent T5 compression fracture status post steroid injection, presented to the [facility] ED on 05/01/15 with 4 days history of progressive generalized weakness, confusion, poor appetite, cough and urinary incontinence.  In the emergency department, T 99.44F, hypoxic at 85% on oxygen, creatinine 1.32, WBC 13.9, hemoglobin 11.4 and chest x-ray with new patchy airspace process in the right mid to lower lung likely a pneumonia. Admitted for CAP, COPD exacerbation, acute on chronic respiratory failure and acute encephalopathy. On 8/29, due to worsening respiratory status patient was transferred from telemetry to stepdown unit. CCM consulted. Palliative care consulted for goals of care. Avoid benzodiazepines  Assessment/Plan:    Community-acquired pneumonia (right mid-lower lung)/? Asp PNA 8/29, strep pneumo + - On admission, started empirically on IV Rocephin and azithromycin  - per PCCM, changed to Augmentin 8/31 - continue scheduled BD's  - Zithromax has been d/c 8/31 - Changed solumedrol to pred 40 mg PO QD; wean by 10 mg every 4d until back to baseline - today is Prednisone 40 mg day #3 - IS and flutter  - smoking cessation already provided - Hold all sedating meds and narcotics --> haldol if needed as it will not suppress respiratory drive.   COPD exacerbation - Precipitated by pneumonia - pt clinically improving, currently on 2 L via [place]  - continue prednisone taper, ABX, BD's  - check oxygen saturation with  ambulation   Acute on chronic respiratory failure with hypoxia (on home oxygen 3 L/m) & hypercapnia - Precipitated by pneumonia and COPD exacerbation - improving, management as noted above   Sepsis - Met sepsis criteria on 05/02/15. Secondary to worsening pneumonia and mental status - ABX as noted above - sepsis etiology resolved but WBC still bit elevated (likely from steroids)  Dehydration with hyponatremia - resolved  - tolerating diet well   Acute kidney injury - resolved with IVF   Acute encephalopathy - Secondary to acute illness complicating underlying possible mild dementia. No focal signs. Treat as above and follow clinically - Mental status worse 8/29 secondary to hypercapnia and sedation. Avoid Ativan. Monitor with management as above  - Use when necessary IV Haldol. Avoid benzodiazepines.  - pt at baseline mental status this AM   Upper and lower back pain - with known thoracic and lumbar compression fractures, confirmed with MRI - IR consulted for consideration of vertebroplasty, appreciate assistance  - plan for vertebroplasty 9/6  Essential hypertension - continue Losartan  - add hydralazine as needed for better BP control   Tobacco abuse - Cessation counseling done   Pre-diabetes - reasonable inpatient control  - Monitor CBGs and SSI as needed  Lung cancer, status post radiation - Said to be in remission. Supposed to have CT chest next week.  DVT prophylaxis - Lovenox SQ  Code Status: DNR Family Communication:  plan of care discussed with the patient and daughter at bedside  Disposition Plan: Home after vertebroplasty   IV access:  Peripheral IV  Procedures and diagnostic studies:    Dg Chest 2 View 05/01/2015  New patchy airspace process over the right mid to lower lung likely a pneumonia. Chronic left perihilar density in left upper lobe scarring. Mild chronic bibasilar interstitial disease.  Stable thoracic and lumbar spine compression  fractures.     Dg Chest Port 1 View 05/04/2015  Mildly worsened interstitial and alveolar opacities bilaterally.     Dg Chest Port 1 View 05/02/2015  Emphysema with superimposed diffuse interstitial accentuation, and mild airspace opacities in the right upper lobe and right middle lobe. There is no current cardiomegaly, raising the possibilities of atypical pneumonia, noncardiogenic edema, or drug reaction superimposed on underlying emphysema. The right basilar airspace opacities have minimally improved compared to yesterday. 2. Suspected trace right pleural effusion.    Medical Consultants:  PCCM PCT  Other Consultants:  PT  IAnti-Infectives:   IV Rocephin 8/28 --> 8/29 IV Zosyn 8/29 --> 8/31 IV azithromycin 8/28 --> 8/31 Augmentin 8/31 -->  Faye Ramsay, MD  [facility] Pager [phone]  If 7PM-7AM, please contact night-coverage [email or web address] Password [facility] 05/05/2015, 6:41 AM   LOS: 4 days   HPI/Subjective: No events overnight. Still with significant lower and upper back pain.    Objective: Filed Vitals:   05/05/15 0200 05/05/15 0337 05/05/15 0400 05/05/15 0600  BP: 135/47  124/48 136/50  Pulse: 88  90 88  Temp:   99.1 F (37.3 C)   TempSrc:   Axillary   Resp: $Remo'19  23 22  'Bhdyf$ Height:      Weight:      SpO2: 94% 99% 93% 93%    Intake/Output Summary (Last 24 hours) at 05/05/15 0641 Last data filed at 05/05/15 0600  Gross per 24 hour  Intake   1200 ml  Output   2125 ml  Net   -925 ml    Exam:   General:  Pt is alert, not in acute distress  Cardiovascular: Regular rate and rhythm,  no rubs, no gallops  Respiratory: Clear to auscultation bilaterally, no wheezing, diminished breath sounds at bases   Abdomen: Soft, non tender, non distended, bowel sounds present, no guarding  Data Reviewed: Basic Metabolic Panel:  Recent Labs Lab 05/01/15 1535 05/02/15 0544 05/03/15 0405 05/05/15 0346  NA 134* 141 143 134*  K 4.8 3.9 4.7 3.6  CL 93* 102 102 94*  CO2 37*  32 31 35*  GLUCOSE 80 71 191* 113*  BUN 62* 39* 31* 23*  CREATININE 1.32* 0.79 0.82 0.57  CALCIUM 9.9 9.5 10.0 9.0   Liver Function Tests:  Recent Labs Lab 05/01/15 1535  AST 30  ALT 24  ALKPHOS 72  BILITOT 0.7  PROT 5.6*  ALBUMIN 2.5*   CBC:  Recent Labs Lab 05/01/15 1535 05/02/15 0544 05/03/15 0405 05/05/15 0346  WBC 13.9* 12.6* 14.4* 14.0*  HGB 11.4* 11.7* 12.3 11.6*  HCT 37.2 37.8 40.4 35.5*  MCV 95.9 95.9 96.0 93.7  PLT 285 263 310 269    Recent Results (from the past 240 hour(s))  MRSA PCR Screening     Status: None   Collection Time: 05/02/15  8:21 AM  Result Value Ref Range Status   MRSA by PCR NEGATIVE NEGATIVE Final    Comment:        The GeneXpert MRSA Assay (FDA approved for NASAL specimens only), is one component of a comprehensive MRSA colonization surveillance program. It is not intended to diagnose MRSA infection nor to guide or monitor treatment for MRSA infections.   Culture, sputum-assessment     Status: None   Collection Time:  05/03/15  4:35 PM  Result Value Ref Range Status   Specimen Description Expect. Sput  Final   Special Requests NONE  Final   Sputum evaluation   Final    THIS SPECIMEN IS ACCEPTABLE. RESPIRATORY CULTURE REPORT TO FOLLOW.   Report Status 05/03/2015 FINAL  Final  Culture, respiratory (NON-Expectorated)     Status: None (Preliminary result)   Collection Time: 05/03/15  4:35 PM  Result Value Ref Range Status   Specimen Description SPUTUM  Final   Special Requests NONE  Final   Gram Stain   Final    FEW WBC PRESENT,BOTH PMN AND MONONUCLEAR RARE SQUAMOUS EPITHELIAL CELLS PRESENT NO ORGANISMS SEEN Performed at [facility]    Culture PENDING  Incomplete   Report Status PENDING  Incomplete     Scheduled Meds: . amoxicillin-clavulanate  1 tablet Oral Q12H  . antiseptic oral rinse  7 mL Mouth Rinse BID  . aspirin  81 mg Oral Daily  . atorvastatin  80 mg Oral Once per day on Sun Wed  . doxazosin  8 mg  Oral QHS  . enoxaparin (LOVENOX) injection  40 mg Subcutaneous Q24H  . guaiFENesin  1,200 mg Oral BID  . ipratropium-albuterol  3 mL Nebulization Q4H  . losartan  100 mg Oral Daily  . predniSONE  40 mg Oral Q breakfast  . sodium chloride  3 mL Intravenous Q12H   Continuous Infusions:

## 2015-05-05 NOTE — Progress Notes (Signed)
Patient is on 3L [place] sat 96%. Patient tolerating well. Bipap is not needed at this time. RT will continue to monitor as needed.

## 2015-05-05 NOTE — Progress Notes (Signed)
Patient ID: Hailey Keith, female   DOB: 1935/06/21, 80 y.o.   MRN: [id number] Request received for possible kyphoplasty on patient with history of back pain as well as thoracic and lumbar compression fractures. Following discussion with patient's daughter she states that her mother's pain is primarily in the upper to mid back although she does have pain in the lower back as well. Based on these symptoms we will obtain MRI thoracic and lumbar spine to assess for acuity of fractures. Following completion of the above study we will follow up with patient and daughter regarding findings and eligibility for possible kyphoplasty.

## 2015-05-06 LAB — BASIC METABOLIC PANEL
Anion gap: 6 (ref 5–15)
BUN: 14 mg/dL (ref 6–20)
CHLORIDE: 97 mmol/L — AB (ref 101–111)
CO2: 36 mmol/L — AB (ref 22–32)
CREATININE: 0.49 mg/dL (ref 0.44–1.00)
Calcium: 9.1 mg/dL (ref 8.9–10.3)
GFR calc non Af Amer: >60 mL/min (ref >60)
Glucose, Bld: 85 mg/dL (ref 65–99)
Potassium: 3.4 mmol/L — ABNORMAL LOW (ref 3.5–5.1)
Sodium: 139 mmol/L (ref 135–145)

## 2015-05-06 LAB — CBC
HEMATOCRIT: 36.7 % (ref 36.0–46.0)
HEMOGLOBIN: 11.6 g/dL — AB (ref 12.0–15.0)
MCH: 29.7 pg (ref 26.0–34.0)
MCHC: 31.6 g/dL (ref 30.0–36.0)
MCV: 93.9 fL (ref 78.0–100.0)
Platelets: 230 10*3/uL (ref 150–400)
RBC: 3.91 MIL/uL (ref 3.87–5.11)
RDW: 14.1 % (ref 11.5–15.5)
WBC: 11.3 10*3/uL — ABNORMAL HIGH (ref 4.0–10.5)

## 2015-05-06 LAB — CULTURE, RESPIRATORY W GRAM STAIN: Culture: NORMAL

## 2015-05-06 LAB — CULTURE, RESPIRATORY

## 2015-05-06 MED ORDER — ALBUTEROL SULFATE (2.5 MG/3ML) 0.083% IN NEBU: 2.5000 mg | INHALATION_SOLUTION | RESPIRATORY_TRACT | Status: DC | PRN

## 2015-05-06 MED ORDER — AMOXICILLIN-POT CLAVULANATE 875-125 MG PO TABS: 1.0000 | ORAL_TABLET | Freq: Two times a day (BID) | ORAL | Status: DC

## 2015-05-06 MED ORDER — OXYCODONE-ACETAMINOPHEN 5-325 MG PO TABS: ORAL_TABLET | ORAL | Status: AC

## 2015-05-06 MED ORDER — PREDNISONE 10 MG PO TABS: ORAL_TABLET | ORAL | Status: DC

## 2015-05-06 NOTE — Clinical Social Work Note (Signed)
Clinical Social Work Assessment  Patient Details  Name: Hailey Keith MRN: [id number] Date of Birth: August 11, 1935  Date of referral:  05/06/15               Reason for consult:  Discharge Planning                Permission sought to share information with:  Family Supports Permission granted to share information::  Yes, Verbal Permission Granted  Name::     Willia Craze  Agency::     Relationship::  daughter  Contact Information:  [phone]  Housing/Transportation Living arrangements for the past 2 months:  [place] of Information:  Adult Children Patient Interpreter Needed:  None Criminal Activity/Legal Involvement Pertinent to Current Situation/Hospitalization:  No - Comment as needed Significant Relationships:  Adult Children Lives with:  Adult Children Do you feel safe going back to the place where you live?  Yes Need for family participation in patient care:  Yes (Comment)  Care giving concerns:  Pt lives with pt daughter. PT recommending SNF. RNCM contacted CSW stating that pt family interested in [facility].   Social Worker assessment / plan:  CSW received phone call from [facility] stating that pt family not agreeable to SNF and requesting [employer] Per RNCM, pt does not meet criteria to remain in the hospital and discharge needed today.   CSW completed FL2 and sent information to [facility].   CSW followed up with pt daughter in hallway along with RNCM. Pt daughter expressed that she thought about it further after speaking with RNCM earlier and pt daughter now wants pt to return home. Pt daughter discussed with RN about the care and feels that pt daughter can manage pt care at home. Pt daughter reports that she has all the equipment she needs at home and can transport pt to her outpatient procedure on Tuesday.   CSW notified [facility]ai[facility] Cowman that pt not longer requiring SNF.   No further social work needs identified at this time.  CSW signing  off.   Employment status:  Retired Nurse, adult PT Recommendations:  [place], [place] / Referral to community resources:  [place]  Patient/Family's Response to care:  Pt daughter supportive and actively involved in pt care. Pt daughter can provide 24 hour care and feel pt will be more satisfied at home.   Patient/Family's Understanding of and Emotional Response to Diagnosis, Current Treatment, and Prognosis:  Pt daughter displayed understanding about pt diagnosis and treatment plan and understands that pt will be discharged home this evening.   Emotional Assessment Appearance:  Appears stated age Attitude/Demeanor/Rapport:  Other (appropriate) Affect (typically observed):  Appropriate Orientation:  Oriented to Self, Oriented to Place, Oriented to  Time, Oriented to Situation Alcohol / Substance use:  Not Applicable Psych involvement (Current and /or in the community):  No (Comment)  Discharge Needs  Concerns to be addressed:  Discharge Planning Concerns Readmission within the last 30 days:  No Current discharge risk:  Physical Impairment Barriers to Discharge:  No Barriers Identified   [place], [place], LCSW 05/06/2015, 5:13 PM  [phone]

## 2015-05-06 NOTE — Progress Notes (Signed)
Spoke with pt at daughter at [place] concerning discharge plans. Pt and daughter agreed with [facility], however changed their minds to go home with no HH at present time. Explained to both pt and daughter hospital stay and insurance. Both understand pt is ready to discharge.

## 2015-05-06 NOTE — Progress Notes (Signed)
Reviewed discharge information with patient and caregiver. Answered all questions. Patient/caregiver able to teach back medications and reasons to contact MD/911. Patient verbalizes importance of PCP follow up appointment and kyphoplasty on 9/6. Reviewed time/date/and location for procedure with daughter with Oneta Rack. Brigitte Pulse, RN

## 2015-05-06 NOTE — Discharge Summary (Signed)
Physician Discharge Summary  Hailey Keith JHE:[id number] DOB: 05-13-1935 DOA: 05/01/2015  PCP: Alesia Richards, MD  Admit date: 05/01/2015 Discharge date: 05/06/2015  Recommendations for Outpatient Follow-up:  1. Pt will need to follow up with PCP in 2-3 weeks post discharge 2. Please obtain BMP to evaluate electrolytes and kidney function 3. Please also check CBC to evaluate Hg and Hct levels 4. Pt scheduled for vertebroplasty at [facility] on Sept 6th, 2016  Discharge Diagnoses:  Principal Problem:   Community acquired pneumonia Active Problems:   Mixed hyperlipidemia   TOBACCO ABUSE   Essential hypertension   Prediabetes   Dehydration   COPD exacerbation   Acute on chronic respiratory failure with hypoxia   Acute encephalopathy   Acute kidney injury   Aspiration pneumonia   Acute on chronic respiratory failure with hypercapnia   Palliative care encounter   Back pain   DNR (do not resuscitate) discussion   Discharge Condition: Stable  Diet recommendation: Heart healthy diet discussed in details    Brief narrative:    80 y.o. female with history of COPD, chronic hypoxic respiratory failure on home oxygen 3 L/m continuously, ongoing tobacco abuse, lung cancer (LUL) status post radiation-said to be in remission, HTN, HLD, depression, hard of hearing, recent T5 compression fracture status post steroid injection, presented to the [facility] ED on 05/01/15 with 4 days history of progressive generalized weakness, confusion, poor appetite, cough and urinary incontinence.  In the emergency department, T 99.40F, hypoxic at 85% on oxygen, creatinine 1.32, WBC 13.9, hemoglobin 11.4 and chest x-ray with new patchy airspace process in the right mid to lower lung likely a pneumonia. Admitted for CAP, COPD exacerbation, acute on chronic respiratory failure and acute encephalopathy. On 8/29, due to worsening respiratory status patient was transferred from telemetry to stepdown  unit. CCM consulted. Palliative care consulted for goals of care. Avoid benzodiazepines  Assessment/Plan:    Community-acquired pneumonia (right mid-lower lung)/? Asp PNA 8/29, strep pneumo + - On admission, started empirically on IV Rocephin and azithromycin  - per PCCM, changed to Augmentin 8/31 - continue scheduled BD's  - Zithromax has been d/c 8/31 - Changed solumedrol to pred 40 mg PO QD; wean by 10 mg every 4d until back to baseline  COPD exacerbation - Precipitated by pneumonia - pt clinically improving - continue prednisone taper, ABX, BD's   Acute on chronic respiratory failure with hypoxia (on home oxygen 3 L/m) & hypercapnia - Precipitated by pneumonia and COPD exacerbation - improving, management as noted above   Sepsis - Met sepsis criteria on 05/02/15. Secondary to worsening pneumonia and mental status - ABX as noted above - sepsis etiology resolved   Dehydration with hyponatremia - resolved  - tolerating diet well   Acute kidney injury - resolved with IVF   Acute encephalopathy - Secondary to acute illness complicating underlying possible mild dementia. No focal signs. Treat as above and follow clinically - pt at baseline mental status this AM   Upper and lower back pain - with known thoracic and lumbar compression fractures, confirmed with MRI - IR consulted for consideration of vertebroplasty, appreciate assistance  - plan for vertebroplasty 9/6  Essential hypertension - continue Losartan   Tobacco abuse - Cessation counseling done   Pre-diabetes - reasonable inpatient control   Lung cancer, status post radiation - Said to be in remission. Supposed to have CT chest next week.   Code Status: DNR Family Communication: plan of care discussed with the patient and  daughter at bedside  Disposition Plan: Home   IV access:  Peripheral IV  Procedures and diagnostic studies:   Dg Chest 2 View 05/01/2015 New patchy airspace  process over the right mid to lower lung likely a pneumonia. Chronic left perihilar density in left upper lobe scarring. Mild chronic bibasilar interstitial disease. Stable thoracic and lumbar spine compression fractures.   Dg Chest Port 1 View 05/04/2015 Mildly worsened interstitial and alveolar opacities bilaterally.   Dg Chest Port 1 View 05/02/2015 Emphysema with superimposed diffuse interstitial accentuation, and mild airspace opacities in the right upper lobe and right middle lobe. There is no current cardiomegaly, raising the possibilities of atypical pneumonia, noncardiogenic edema, or drug reaction superimposed on underlying emphysema. The right basilar airspace opacities have minimally improved compared to yesterday. 2. Suspected trace right pleural effusion.   Medical Consultants:  PCCM PCT  Other Consultants:  PT  IAnti-Infectives:   IV Rocephin 8/28 --> 8/29 IV Zosyn 8/29 --> 8/31 IV azithromycin 8/28 --> 8/31 Augmentin 8/31 -->       Discharge Exam: Filed Vitals:   05/06/15 1400  BP: 105/49  Pulse: 85  Temp: 98.2 F (36.8 C)  Resp: 18   Filed Vitals:   05/06/15 0330 05/06/15 0550 05/06/15 0826 05/06/15 1400  BP:  132/60  105/49  Pulse: 82 79  85  Temp:  98.7 F (37.1 C)  98.2 F (36.8 C)  TempSrc:  Oral  Axillary  Resp: _0 Height:      Weight:      SpO2: 95% 96% 94% 93%    General: Pt is alert, follows commands appropriately, not in acute distress Cardiovascular: Regular rate and rhythm, no rubs, no gallops Respiratory: Clear to auscultation bilaterally, no wheezing, no crackles, no rhonchi Abdominal: Soft, non tender, non distended, bowel sounds +, no guarding  Discharge Instructions  Discharge Instructions    Diet - low sodium heart healthy    Complete by:  As directed      Increase activity slowly    Complete by:  As directed             Medication List    STOP taking these medications        levofloxacin 500  MG tablet  Commonly known as:  LEVAQUIN      TAKE these medications        amoxicillin-clavulanate 875-125 MG per tablet  Commonly known as:  AUGMENTIN  Take 1 tablet by mouth every 12 (twelve) hours.     aspirin 81 MG tablet  Take 81 mg by mouth daily.     atorvastatin 80 MG tablet  Commonly known as:  LIPITOR  Take 80 mg by mouth 2 (two) times a week. WED AND Sun     citalopram 40 MG tablet  Commonly known as:  CELEXA  TAKE 1 TABLET BY MOUTH DAILY FOR MOOD     doxazosin 8 MG tablet  Commonly known as:  CARDURA  TAKE 1 TABLET AT BEDTIME     guaifenesin 400 MG Tabs tablet  Commonly known as:  HUMIBID E  Take 400 mg by mouth 2 (two) times daily.     ipratropium-albuterol 0.5-2.5 (3) MG/3ML Soln  Commonly known as:  DUONEB  TAKE 3 MLS BY NEBULIZATION 4 (FOUR) TIMES DAILY.     losartan 100 MG tablet  Commonly known as:  COZAAR  Take 100 mg by mouth daily.     Oxycodone HCl 10 MG Tabs  Take  10 mg by mouth every 4 (four) hours as needed (for pain).     oxyCODONE-acetaminophen 5-325 MG per tablet  Commonly known as:  PERCOCET/ROXICET  Take two tablets by mouth every four hours as needed for pain. Do not exceed 4gms of Tylenol in 24 hours     predniSONE 10 MG tablet  Commonly known as:  DELTASONE  Take 40 mg tablet tomorrow 9/3. Starting 9/4 - 9/7 take 30 mg tablet daily, starting 9/8 - 9/11 take 20 mg tablet daily, starting 9/12 - 9/15 take 10 mg tablet daily and then stop     PROAIR HFA 108 (90 BASE) MCG/ACT inhaler  Generic drug:  albuterol  1 TO 2 INHALATIONS EVERY 4 HOURS AS NEEDED FOR RESCUE ASTHMA     albuterol (2.5 MG/3ML) 0.083% nebulizer solution  Commonly known as:  PROVENTIL  Take 3 mLs (2.5 mg total) by nebulization every 4 (four) hours as needed for wheezing.     Vitamin D (Ergocalciferol) 50000 UNITS Caps capsule  Commonly known as:  DRISDOL  TAKE ONE CAPSULE EVERY DAY            Follow-up Information    Follow up with Alesia Richards,  MD.   Specialty:  Internal Medicine   Contact information:   [address] [place] [address] [phone]       Call Faye Ramsay, MD.   Specialty:  Internal Medicine   Why:  As needed call my cell phone [phone]   Contact information:   [address] [place] [address] [phone]        The results of significant diagnostics from this hospitalization (including imaging, microbiology, ancillary and laboratory) are listed below for reference.     Microbiology: Recent Results (from the past 240 hour(s))  MRSA PCR Screening     Status: None   Collection Time: 05/02/15  8:21 AM  Result Value Ref Range Status   MRSA by PCR NEGATIVE NEGATIVE Final    Comment:        The GeneXpert MRSA Assay (FDA approved for NASAL specimens only), is one component of a comprehensive MRSA colonization surveillance program. It is not intended to diagnose MRSA infection nor to guide or monitor treatment for MRSA infections.   Culture, sputum-assessment     Status: None   Collection Time: 05/03/15  4:35 PM  Result Value Ref Range Status   Specimen Description Expect. Sput  Final   Special Requests NONE  Final   Sputum evaluation   Final    THIS SPECIMEN IS ACCEPTABLE. RESPIRATORY CULTURE REPORT TO FOLLOW.   Report Status 05/03/2015 FINAL  Final  Culture, respiratory (NON-Expectorated)     Status: None   Collection Time: 05/03/15  4:35 PM  Result Value Ref Range Status   Specimen Description SPUTUM  Final   Special Requests NONE  Final   Gram Stain   Final    FEW WBC PRESENT,BOTH PMN AND MONONUCLEAR RARE SQUAMOUS EPITHELIAL CELLS PRESENT NO ORGANISMS SEEN Performed at [facility]    Culture   Final    NORMAL OROPHARYNGEAL FLORA Performed at [facility]    Report Status 05/06/2015 FINAL  Final     Labs: Basic Metabolic Panel:  Recent Labs Lab 05/01/15 1535 05/02/15 0544 05/03/15 0405 05/05/15 0346  05/06/15 0533  NA 134* 141 143 134* 139  K 4.8 3.9 4.7 3.6 3.4*  CL 93* 102 102 94* 97*  CO2 37* 32 31 35*  36*  GLUCOSE 80 71 191* 113* 85  BUN 62* 39* 31* 23* 14  CREATININE 1.32* 0.79 0.82 0.57 0.49  CALCIUM 9.9 9.5 10.0 9.0 9.1   Liver Function Tests:  Recent Labs Lab 05/01/15 1535  AST 30  ALT 24  ALKPHOS 72  BILITOT 0.7  PROT 5.6*  ALBUMIN 2.5*   No results for input(s): LIPASE, AMYLASE in the last 168 hours. No results for input(s): AMMONIA in the last 168 hours. CBC:  Recent Labs Lab 05/01/15 1535 05/02/15 0544 05/03/15 0405 05/05/15 0346 05/06/15 0533  WBC 13.9* 12.6* 14.4* 14.0* 11.3*  HGB 11.4* 11.7* 12.3 11.6* 11.6*  HCT 37.2 37.8 40.4 35.5* 36.7  MCV 95.9 95.9 96.0 93.7 93.9  PLT 285 263 310 269 230   Cardiac Enzymes: No results for input(s): CKTOTAL, CKMB, CKMBINDEX, TROPONINI in the last 168 hours. BNP: BNP (last 3 results) No results for input(s): BNP in the last 8760 hours.  ProBNP (last 3 results) No results for input(s): PROBNP in the last 8760 hours.  CBG: No results for input(s): GLUCAP in the last 168 hours.   SIGNED: Time coordinating discharge: 30 minutes  Faye Ramsay, MD  [facility] 05/06/2015, 4:23 PM Pager [phone]  If 7PM-7AM, please contact night-coverage [email or web address] Password [email or web address]

## 2015-05-06 NOTE — Discharge Instructions (Signed)
Vertebral Fracture  You have a fracture of one or more vertebra. These are the bony parts that form the spine. Minor vertebral fractures happen when people fall. Osteoporosis is associated with many of these fractures. Hospital care may not be necessary for minor compression fractures that are stable. However, multiple fractures of the spine or unstable injuries can cause severe pain and even damage the spinal cord. A spinal cord injury may cause paralysis, numbness, or loss of normal bowel and bladder control.   Normally there is pain and stiffness in the back for 3 to 6 weeks after a vertebral fracture. Bed rest for several days, pain medicine, and a slow return to activity is often the only treatment that is needed depending on the location of the fracture. Neck and back braces may be helpful in reducing pain and increasing mobility. When your pain allows, you should begin walking or swimming to help maintain your endurance. Exercises to improve motion and to strengthen the back may also be useful after the initial pain improves. Treatment for osteoporosis may be essential for full recovery. This will help reduce your risk of vertebral fractures with a future fall.  During the first few days after a spine fracture you may feel nauseated or vomit. If this is severe, hospital care with IV fluids will be needed.   Arrange for follow-up care as recommended to assure proper long-term care and prevention of further spine injury.   SEEK IMMEDIATE MEDICAL CARE IF:   You have increasing pain, vomiting, or are unable to move around at all.   You develop numbness, tingling, weakness, or paralysis of any part of your body.   You develop a loss of normal bowel or bladder control.   You have difficulty breathing, cough, fever, chest or abdominal pain.  MAKE SURE YOU:    Understand these instructions.   Will watch your condition.   Will get help right away if you are not doing well or get worse.  Document Released:  09/27/2004 Document Revised: 11/12/2011 Document Reviewed: 04/12/2009  [employer] Patient Information 2015 [employer]. This information is not intended to replace advice given to you by your health care provider. Make sure you discuss any questions you have with your health care provider.

## 2015-05-06 NOTE — Progress Notes (Signed)
Patient ID: Hailey Keith, female   DOB: [birth year], 80 y.o.   MRN: [id number] Recent T/L spine MRI reviewed by Dr. Estanislado Pandy. Pt has acute compression fractures at T4/5 levels and is a candidate for vertebroplasty at these sites. Pt ate breakfast at 0900 today and received lovenox at 2200 9/1. Due to these factors and need for transport to [facility] for procedure, start time would be too late today to perform procedure; therefore plans are for procedure to be done on 9/6 at [facility] [place] at 1000. Pt can remain at [facility] or be transferred to [facility] in interim per primary team's choice. Will cont to monitor for now. Tent plans d/w pt/daughter briefly this am.

## 2015-05-10 ENCOUNTER — Other Ambulatory Visit: Payer: Self-pay | Admitting: Radiology

## 2015-05-10 ENCOUNTER — Ambulatory Visit (HOSPITAL_COMMUNITY)
Admit: 2015-05-10 | Discharge: 2015-05-10 | Disposition: A | Discharge status: Home / Self Care | Payer: Medicare Other | Source: Ambulatory Visit | Attending: Interventional Radiology | Admitting: Interventional Radiology

## 2015-05-10 ENCOUNTER — Other Ambulatory Visit (HOSPITAL_COMMUNITY): Payer: Self-pay | Admitting: Interventional Radiology

## 2015-05-10 ENCOUNTER — Ambulatory Visit (HOSPITAL_COMMUNITY)
Admission: RE | Admit: 2015-05-10 | Discharge: 2015-05-10 | Disposition: A | Discharge status: Home / Self Care | Payer: Medicare Other | Source: Ambulatory Visit | Attending: Interventional Radiology | Admitting: Interventional Radiology

## 2015-05-10 ENCOUNTER — Encounter (HOSPITAL_COMMUNITY): Payer: Self-pay

## 2015-05-10 DIAGNOSIS — M5124 Other intervertebral disc displacement, thoracic region: Secondary | ICD-10-CM | POA: Insufficient documentation

## 2015-05-10 DIAGNOSIS — IMO0002 Reserved for concepts with insufficient information to code with codable children: Secondary | ICD-10-CM

## 2015-05-10 DIAGNOSIS — M4854XA Collapsed vertebra, not elsewhere classified, thoracic region, initial encounter for fracture: Secondary | ICD-10-CM | POA: Diagnosis not present

## 2015-05-10 DIAGNOSIS — Z85118 Personal history of other malignant neoplasm of bronchus and lung: Secondary | ICD-10-CM | POA: Insufficient documentation

## 2015-05-10 DIAGNOSIS — F17210 Nicotine dependence, cigarettes, uncomplicated: Secondary | ICD-10-CM | POA: Insufficient documentation

## 2015-05-10 DIAGNOSIS — Z7982 Long term (current) use of aspirin: Secondary | ICD-10-CM | POA: Diagnosis not present

## 2015-05-10 DIAGNOSIS — E785 Hyperlipidemia, unspecified: Secondary | ICD-10-CM | POA: Insufficient documentation

## 2015-05-10 DIAGNOSIS — R7309 Other abnormal glucose: Secondary | ICD-10-CM | POA: Diagnosis not present

## 2015-05-10 DIAGNOSIS — M546 Pain in thoracic spine: Secondary | ICD-10-CM

## 2015-05-10 DIAGNOSIS — I10 Essential (primary) hypertension: Secondary | ICD-10-CM | POA: Insufficient documentation

## 2015-05-10 DIAGNOSIS — J449 Chronic obstructive pulmonary disease, unspecified: Secondary | ICD-10-CM | POA: Diagnosis not present

## 2015-05-10 DIAGNOSIS — Z85828 Personal history of other malignant neoplasm of skin: Secondary | ICD-10-CM | POA: Insufficient documentation

## 2015-05-10 DIAGNOSIS — S22000A Wedge compression fracture of unspecified thoracic vertebra, initial encounter for closed fracture: Secondary | ICD-10-CM | POA: Insufficient documentation

## 2015-05-10 LAB — CBC
HEMATOCRIT: 38.2 % (ref 36.0–46.0)
HEMOGLOBIN: 11.9 g/dL — AB (ref 12.0–15.0)
MCH: 29.2 pg (ref 26.0–34.0)
MCHC: 31.2 g/dL (ref 30.0–36.0)
MCV: 93.9 fL (ref 78.0–100.0)
Platelets: 229 10*3/uL (ref 150–400)
RBC: 4.07 MIL/uL (ref 3.87–5.11)
RDW: 14 % (ref 11.5–15.5)
WBC: 13.5 10*3/uL — ABNORMAL HIGH (ref 4.0–10.5)

## 2015-05-10 LAB — BASIC METABOLIC PANEL
ANION GAP: 4 — AB (ref 5–15)
BUN: 9 mg/dL (ref 6–20)
CALCIUM: 8.9 mg/dL (ref 8.9–10.3)
CO2: 34 mmol/L — AB (ref 22–32)
Chloride: 98 mmol/L — ABNORMAL LOW (ref 101–111)
Creatinine, Ser: 0.62 mg/dL (ref 0.44–1.00)
GFR calc non Af Amer: >60 mL/min (ref >60)
GLUCOSE: 104 mg/dL — AB (ref 65–99)
POTASSIUM: 5.1 mmol/L (ref 3.5–5.1)
Sodium: 136 mmol/L (ref 135–145)

## 2015-05-10 LAB — APTT: aPTT: 20 seconds — ABNORMAL LOW (ref 24–37)

## 2015-05-10 LAB — PROTIME-INR
INR: 0.94 (ref 0.00–1.49)
PROTHROMBIN TIME: 12.7 s (ref 11.6–15.2)

## 2015-05-10 MED ORDER — FENTANYL CITRATE (PF) 100 MCG/2ML IJ SOLN
INTRAMUSCULAR | Status: AC | PRN
  Administered 2015-05-10: 25 ug via INTRAVENOUS
  Administered 2015-05-10 (×2): 12.5 ug via INTRAVENOUS

## 2015-05-10 MED ORDER — MIDAZOLAM HCL 2 MG/2ML IJ SOLN
INTRAMUSCULAR | Status: AC
  Filled 2015-05-10: qty 4

## 2015-05-10 MED ORDER — CEFAZOLIN SODIUM-DEXTROSE 2-3 GM-% IV SOLR
2.0000 g | Freq: Once | INTRAVENOUS | Status: AC
  Administered 2015-05-10: 2 g via INTRAVENOUS

## 2015-05-10 MED ORDER — CEFAZOLIN SODIUM-DEXTROSE 2-3 GM-% IV SOLR
INTRAVENOUS | Status: AC
  Filled 2015-05-10: qty 50

## 2015-05-10 MED ORDER — IOHEXOL 300 MG/ML  SOLN
50.0000 mL | Freq: Once | INTRAMUSCULAR | Status: DC | PRN
  Administered 2015-05-10: 5 mL
  Filled 2015-05-10: qty 50

## 2015-05-10 MED ORDER — TOBRAMYCIN SULFATE 1.2 G IJ SOLR
INTRAMUSCULAR | Status: AC
  Filled 2015-05-10: qty 1.2

## 2015-05-10 MED ORDER — HYDROMORPHONE HCL 1 MG/ML IJ SOLN
INTRAMUSCULAR | Status: AC
  Filled 2015-05-10: qty 1

## 2015-05-10 MED ORDER — SODIUM CHLORIDE 0.9 % IV SOLN
Freq: Once | INTRAVENOUS | Status: AC
  Administered 2015-05-10: 09:00:00 via INTRAVENOUS

## 2015-05-10 MED ORDER — GELATIN ABSORBABLE 12-7 MM EX MISC
CUTANEOUS | Status: AC
  Filled 2015-05-10: qty 1

## 2015-05-10 MED ORDER — BUPIVACAINE HCL (PF) 0.25 % IJ SOLN
INTRAMUSCULAR | Status: AC
  Filled 2015-05-10: qty 30

## 2015-05-10 MED ORDER — MIDAZOLAM HCL 2 MG/2ML IJ SOLN
INTRAMUSCULAR | Status: AC | PRN
  Administered 2015-05-10: 0.5 mg via INTRAVENOUS

## 2015-05-10 MED ORDER — FENTANYL CITRATE (PF) 100 MCG/2ML IJ SOLN
INTRAMUSCULAR | Status: AC
  Filled 2015-05-10: qty 4

## 2015-05-10 MED ORDER — SODIUM CHLORIDE 0.9 % IV SOLN: INTRAVENOUS | Status: AC

## 2015-05-10 NOTE — Discharge Instructions (Signed)
1.No stooping ,bending or lifting more than 10 lbs for 2 weeks. 2.Use walker to ambulate for 2 weeks. 3.RTC in 2 weeks. 4.No driving for 2 weeks   KYPHOPLASTY/VERTEBROPLASTY DISCHARGE INSTRUCTIONS  Medications: (check all that apply)     Resume all home medications as before procedure.                  Continue your pain medications as prescribed as needed.  Over the next 3-5 days, decrease your pain medication as tolerated.  Over the counter medications (i.e. Tylenol, ibuprofen, and aleve) may be substituted once severe/moderate pain symptoms have subsided.   Wound Care: - Bandages may be removed the day following your procedure.  You may get your incision wet once bandages are removed.  Bandaids may be used to cover the incisions until scab formation.  Topical ointments are optional.  - If you develop a fever greater than 101 degrees, have increased skin redness at the incision sites or pus-like oozing from incisions occurring within 1 week of the procedure, contact radiology at [phone] or [phone].  - Ice pack to back for 15-20 minutes 2-3 time per day for first 2-3 days post procedure.  The ice will expedite muscle healing and help with the pain from the incisions.   Activity: - Bedrest today with limited activity for 24 hours post procedure.  - No driving for 48 hours.  - Increase your activity as tolerated after bedrest (with assistance if necessary).  - Refrain from any strenuous activity or heavy lifting (greater than 10 lbs.).   Follow up: - Contact radiology at [phone] or [phone] if any questions/concerns.  - A physician assistant from radiology will contact you in approximately 1 week.  - If a biopsy was performed at the time of your procedure, your referring physician should receive the results in usually 2-3 days.

## 2015-05-10 NOTE — Sedation Documentation (Signed)
Patient denies pain and is resting comfortably.  

## 2015-05-10 NOTE — H&P (Signed)
Chief Complaint: Patient was seen in consultation today for back pain; T4/5 fx at the request of Dr Lucky Cowboy  Referring Physician(s): DR Oneta Rack  History of Present Illness: Hailey Keith is a 80 y.o. female   Pt fell 12/2014; suffered hip and wrist fx No surgery--healed nicely on own Did well for months Developed onset back pain 8./2016- no known injury Work up 05/05/2015:  IMPRESSION: MRI THORACIC SPINE IMPRESSION:  1. Acute anterior compression fracture of T5 with approximately 80% of anterior height loss with 2 mm of bony retropulsion. No significant stenosis. This as of worsened relative to prior CT from 03/23/2015. 2. Additional acute compression fracture through the inferior endplate of T4 with approximately 10-20% of central height loss without bony retropulsion. 3. Central/left paracentral disc protrusion at T4-5 with resultant mild canal stenosis, likely related to adjacent compression fractures. 4. Small central disc protrusion at T6-7 without stenosis. 5. Small bilateral pleural effusions with associated bibasilar atelectasis.  Now scheduled for Thoracic 4 and 5 vertebroplasty/kyphoplasty procedure Wbc 13.5 (on Prednisone)  Past Medical History  Diagnosis Date  . Hypertension   . Dyslipidemia   . Hard of hearing   . Hx of radiation therapy 06/18/13- 07/23/13    LUL lung primary, 7020 cGy 26 sessions  . 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 Skin cancer     basal cell  . COPD (chronic obstructive pulmonary disease)   . prediabetes     Past Surgical History  Procedure Laterality Date  . Tonsillectomy      as child, repeat surgery early 20's  . Mohs surgery      "several times"  . Facial reconstruction surgery  25 years ago    to remove skin cancer, right side of face    Allergies: Paxil  Medications: Prior to Admission medications   Medication Sig Start Date End Date Taking? Authorizing Provider  albuterol (PROVENTIL)  (2.5 MG/3ML) 0.083% nebulizer solution Take 3 mLs (2.5 mg total) by nebulization every 4 (four) hours as needed for wheezing. 05/06/15  Yes Dorothea Ogle, MD  amoxicillin-clavulanate (AUGMENTIN) 875-125 MG per tablet Take 1 tablet by mouth every 12 (twelve) hours. 05/06/15  Yes Dorothea Ogle, MD  aspirin 81 MG tablet Take 81 mg by mouth daily.   Yes Historical Provider, MD  citalopram (CELEXA) 40 MG tablet TAKE 1 TABLET BY MOUTH DAILY FOR MOOD 12/24/14  Yes Quentin Mulling, PA-C  doxazosin (CARDURA) 8 MG tablet TAKE 1 TABLET AT BEDTIME 03/21/15  Yes Courtney Forcucci, PA-C  guaifenesin (HUMIBID E) 400 MG TABS tablet Take 400 mg by mouth 2 (two) times daily.   Yes Historical Provider, MD  ipratropium-albuterol (DUONEB) 0.5-2.5 (3) MG/3ML SOLN TAKE 3 MLS BY NEBULIZATION 4 (FOUR) TIMES DAILY. 04/18/15  Yes Quentin Mulling, PA-C  losartan (COZAAR) 100 MG tablet Take 100 mg by mouth daily.   Yes Historical Provider, MD  Oxycodone HCl 10 MG TABS Take 10 mg by mouth every 4 (four) hours as needed (for pain).   Yes Historical Provider, MD  oxyCODONE-acetaminophen (PERCOCET/ROXICET) 5-325 MG per tablet Take two tablets by mouth every four hours as needed for pain. Do not exceed 4gms of Tylenol in 24 hours 05/06/15  Yes Dorothea Ogle, MD  predniSONE (DELTASONE) 10 MG tablet Take 40 mg tablet tomorrow 9/3. Starting 9/4 - 9/7 take 30 mg tablet daily, starting 9/8 - 9/11 take 20 mg tablet daily, starting 9/12 - 9/15 take 10 mg tablet  daily and then stop 05/06/15  Yes Theodis Blaze, MD  PROAIR HFA 108 [phone] BASE) MCG/ACT inhaler 1 TO 2 INHALATIONS EVERY 4 HOURS AS NEEDED FOR RESCUE ASTHMA 04/18/15  Yes Vicie Mutters, PA-C  atorvastatin (LIPITOR) 80 MG tablet Take 80 mg by mouth 2 (two) times a week. WED AND Sun    Historical Provider, MD  Vitamin D, Ergocalciferol, (DRISDOL) 50000 UNITS CAPS capsule TAKE ONE CAPSULE EVERY DAY Patient taking differently: TAKE ONE CAPSULE TWICE PER WEEK on Sunday's and Wednesday's 03/01/15   Vicie Mutters, PA-C     Family History  Problem Relation Age of Onset  . Hypertension    . Lung cancer Sister     tx w/radiation, met to stomach  . Pneumonia Mother     Deceased  . Heart attack Father     Deceased  . Cancer Sister     "back of neck"    Social History   Social History  . Marital Status: Single    Spouse Name: N/A  . Number of Children: 1  . Years of Education: N/A   Occupational History  . retired    Social History Main Topics  . Smoking status: Current Every Day Smoker -- 0.50 packs/day for 60 years    Types: Cigarettes  . Smokeless tobacco: None     Comment: 06/05/13 currently 1/2 PPD, trying to quit  . Alcohol Use: Yes     Comment: occ wine, 2 drinks nightly  . Drug Use: No  . Sexual Activity: Not Asked   Other Topics Concern  . None   Social History Narrative    Review of Systems: A 12 point ROS discussed and pertinent positives are indicated in the HPI above.  All other systems are negative.  Review of Systems  Constitutional: Positive for activity change and appetite change. Negative for fever.  Respiratory: Negative for shortness of breath and wheezing.   Cardiovascular: Negative for chest pain.  Musculoskeletal: Positive for back pain.  Neurological: Positive for weakness.  Psychiatric/Behavioral: Negative for behavioral problems and confusion.    Vital Signs: BP 120/58 mmHg  Pulse 79  Temp(Src) 97.9 F (36.6 C) (Oral)  Resp 22  Ht $R'5\' 4"'Dt$  (1.626 m)  Wt 140 lb (63.504 kg)  BMI 24.02 kg/m2  SpO2 94%  Physical Exam  Constitutional: She is oriented to person, place, and time.  Cardiovascular: Normal rate, regular rhythm and normal heart sounds.   Pulmonary/Chest: Effort normal and breath sounds normal.  Abdominal: Soft. Bowel sounds are normal. There is no tenderness.  Musculoskeletal: Normal range of motion. She exhibits tenderness.  Middle back pain  Neurological: She is alert and oriented to person, place, and time.  Skin: Skin  is warm and dry.  Psychiatric: She has a normal mood and affect. Her behavior is normal. Judgment and thought content normal.  dtr also in room for consent and explanation of procedure  Nursing note and vitals reviewed.   Mallampati Score:  MD Evaluation Airway: WNL Heart: WNL Abdomen: WNL Chest/ Lungs: WNL ASA  Classification: 3 Mallampati/Airway Score: Two  Imaging: Dg Chest 2 View  05/01/2015   CLINICAL DATA:  Increasing lethargy and anorexia. Altered mental status and decreased PO intake.  EXAM: CHEST  2 VIEW  COMPARISON:  03/22/2015, chest CT 03/23/2015 as well as chest CT 11/23/2014  FINDINGS: Patient slightly rotated to the right. Lungs are adequately inflated and demonstrate patchy airspace opacification over the right mid to lower lung. There is stable left  perihilar density scarring over the left upper lobe. Mild chronic bibasilar interstitial disease. No evidence of effusion. Cardiomediastinal silhouette is within normal. There is a stable compression fracture over the mid to upper thoracic spine as well as over the upper lumbar spine.  IMPRESSION: New patchy airspace process over the right mid to lower lung likely a pneumonia. Chronic left perihilar density in left upper lobe scarring. Mild chronic bibasilar interstitial disease.  Stable thoracic and lumbar spine compression fractures.   Electronically Signed   By: Marin Olp M.D.   On: 05/01/2015 15:54   Mr Thoracic Spine W Wo Contrast  05/05/2015   CLINICAL DATA:  Initial evaluation for possible T5 fracture, back pain. History of lung cancer, skin cancer.  EXAM: MRI THORACIC AND LUMBAR SPINE WITHOUT AND WITH CONTRAST  TECHNIQUE: Multiplanar and multiecho pulse sequences of the thoracic and lumbar spine were obtained without and with intravenous contrast.  CONTRAST:  57mL MULTIHANCE GADOBENATE DIMEGLUMINE 529 MG/ML IV SOLN  COMPARISON:  Prior CT from 03/23/2015 and 12/25/2014.  FINDINGS: MR THORACIC SPINE FINDINGS  Trace  anterolisthesis of C7 on T1. Vertebral bodies are otherwise normally aligned with preservation of the normal thoracic kyphosis. No listhesis.  Signal intensity within the thoracic spinal cord is normal. Evaluation somewhat limited on this exam as the axial images through the upper thoracic spine are vertically flipped due to technical error.  There is an anterior compression deformity of the T5 vertebral body with focal kyphosis of the thoracic spine at this level. This fracture demonstrates hypo intense precontrast T1 signal intensity with hyperintense STIR signal intensity with postcontrast enhancement, most consistent with an acute compression fracture. There is approximately 80% of anterior height loss with 2 mm bony retropulsion. No significant canal stenosis. This has worsened relative to prior CT from 03/23/2015.  Additional concavity with linear T1 hypo intense, STIR hyperintense signal intensity with postcontrast enhancement seen through the inferior endplate of T4. There is approximately 10-20% of central height loss without bony retropulsion.  There is associated central/left paracentral disc protrusion of the T4-5 intervertebral disc with resultant partial effacement of the ventral thecal sac and mild canal stenosis.  No other compression fracture identified. Vertebral body heights otherwise maintained. No other marrow edema. Probable small benign hemangiomas within the T9 and T11 vertebral bodies. These are also seen on prior CT of the chest.  Degenerative spondylolysis noted within the partially visualized lower cervical spine. Small central disc protrusion at T6-7 without significant stenosis. Bilateral facet arthrosis present at T11-12 without stenosis.  Small bilateral pleural effusions with associated atelectasis noted. Paraspinous soft tissues otherwise within normal limits.  MR LUMBAR SPINE FINDINGS  Slight straightening and reversal of the normal lumbar lordosis at L2. Vertebral bodies otherwise  normally aligned. There is a chronic compression fracture of L2 with approximately 60% of height loss and 2 mm of bony retropulsion. No marrow edema to suggest that this is acute or subacute in nature. Vertebral body heights otherwise maintained without acute fracture. No marrow edema. The vague enhancement with associated hyperintense T1/T2 hyperintense signal intensity within the posterior aspect of T11 most consistent with a probable benign hemangioma, as seen on prior CT. No other abnormal enhancement or focal osseous lesion.  Conus medullaris terminates normally at the L1 level. Signal intensity within the visualized cord is normal. Nerve roots of the cauda equina within normal limits.  Paraspinous soft tissues demonstrate no acute abnormality. Fatty atrophy noted within the paraspinous musculature.  T11-12:  Negative.  T12-L1: Shallow  right paracentral disc protrusion with associated annular fissure flattens the right ventral thecal sac without significant canal stenosis or neural impingement. Foramina are widely patent.  L1-2: Diffuse degenerative disc bulge with disc desiccation. Associated annular fissure posteriorly. Disc bulging slightly eccentric to the right. Flattening of the ventral thecal sac without significant canal stenosis. Foramina are widely patent.  L2-3: Diffuse disc bulge with disc desiccation. Associated annular fissure posteriorly. There is bony retropulsion of approximately 2 mm of the anterior aspect of the posterior L2 vertebral body related to the chronic compression fracture. Mild lateral recess crowding bilaterally. No significant foraminal narrowing.  L3-4: Mild diffuse annular disc bulge with disc desiccation. Associated posterior annular fissure. Mild facet and ligamentous hypertrophy. No focal disc herniation. No significant canal or foraminal stenosis.  L4-5: Mild diffuse disc bulge with disc desiccation. Associated annular fissure posteriorly. No focal disc herniation. Mild to  moderate bilateral facet arthrosis with ligamentous hypertrophy. There is resultant mild lateral recess stenosis bilaterally. Foramina remain widely patent.  L5-S1: Mild diffuse disc bulge with disc desiccation. Posterior annular fissure. No significant canal or foraminal stenosis. No focal disc herniation.  Nonenhancing nerve root sleeve cyst noted posterior to the S2 segment.  IMPRESSION: MRI THORACIC SPINE IMPRESSION:  1. Acute anterior compression fracture of T5 with approximately 80% of anterior height loss with 2 mm of bony retropulsion. No significant stenosis. This as of worsened relative to prior CT from 03/23/2015. 2. Additional acute compression fracture through the inferior endplate of T4 with approximately 10-20% of central height loss without bony retropulsion. 3. Central/left paracentral disc protrusion at T4-5 with resultant mild canal stenosis, likely related to adjacent compression fractures. 4. Small central disc protrusion at T6-7 without stenosis. 5. Small bilateral pleural effusions with associated bibasilar atelectasis.  MRI LUMBAR SPINE IMPRESSION:  1. No acute abnormality within the lumbar spine. Previously noted compression fracture of L2 is chronic. 2. Mild multilevel degenerative spondylolysis and facet arthrosis as detailed above. No significant stenosis within the lumbar spine.   Electronically Signed   By: Jeannine Boga M.D.   On: 05/05/2015 22:25   Mr Lumbar Spine W Wo Contrast  05/05/2015   CLINICAL DATA:  Initial evaluation for possible T5 fracture, back pain. History of lung cancer, skin cancer.  EXAM: MRI THORACIC AND LUMBAR SPINE WITHOUT AND WITH CONTRAST  TECHNIQUE: Multiplanar and multiecho pulse sequences of the thoracic and lumbar spine were obtained without and with intravenous contrast.  CONTRAST:  101mL MULTIHANCE GADOBENATE DIMEGLUMINE 529 MG/ML IV SOLN  COMPARISON:  Prior CT from 03/23/2015 and 12/25/2014.  FINDINGS: MR THORACIC SPINE FINDINGS  Trace  anterolisthesis of C7 on T1. Vertebral bodies are otherwise normally aligned with preservation of the normal thoracic kyphosis. No listhesis.  Signal intensity within the thoracic spinal cord is normal. Evaluation somewhat limited on this exam as the axial images through the upper thoracic spine are vertically flipped due to technical error.  There is an anterior compression deformity of the T5 vertebral body with focal kyphosis of the thoracic spine at this level. This fracture demonstrates hypo intense precontrast T1 signal intensity with hyperintense STIR signal intensity with postcontrast enhancement, most consistent with an acute compression fracture. There is approximately 80% of anterior height loss with 2 mm bony retropulsion. No significant canal stenosis. This has worsened relative to prior CT from 03/23/2015.  Additional concavity with linear T1 hypo intense, STIR hyperintense signal intensity with postcontrast enhancement seen through the inferior endplate of T4. There is approximately 10-20% of central  height loss without bony retropulsion.  There is associated central/left paracentral disc protrusion of the T4-5 intervertebral disc with resultant partial effacement of the ventral thecal sac and mild canal stenosis.  No other compression fracture identified. Vertebral body heights otherwise maintained. No other marrow edema. Probable small benign hemangiomas within the T9 and T11 vertebral bodies. These are also seen on prior CT of the chest.  Degenerative spondylolysis noted within the partially visualized lower cervical spine. Small central disc protrusion at T6-7 without significant stenosis. Bilateral facet arthrosis present at T11-12 without stenosis.  Small bilateral pleural effusions with associated atelectasis noted. Paraspinous soft tissues otherwise within normal limits.  MR LUMBAR SPINE FINDINGS  Slight straightening and reversal of the normal lumbar lordosis at L2. Vertebral bodies otherwise  normally aligned. There is a chronic compression fracture of L2 with approximately 60% of height loss and 2 mm of bony retropulsion. No marrow edema to suggest that this is acute or subacute in nature. Vertebral body heights otherwise maintained without acute fracture. No marrow edema. The vague enhancement with associated hyperintense T1/T2 hyperintense signal intensity within the posterior aspect of T11 most consistent with a probable benign hemangioma, as seen on prior CT. No other abnormal enhancement or focal osseous lesion.  Conus medullaris terminates normally at the L1 level. Signal intensity within the visualized cord is normal. Nerve roots of the cauda equina within normal limits.  Paraspinous soft tissues demonstrate no acute abnormality. Fatty atrophy noted within the paraspinous musculature.  T11-12:  Negative.  T12-L1: Shallow right paracentral disc protrusion with associated annular fissure flattens the right ventral thecal sac without significant canal stenosis or neural impingement. Foramina are widely patent.  L1-2: Diffuse degenerative disc bulge with disc desiccation. Associated annular fissure posteriorly. Disc bulging slightly eccentric to the right. Flattening of the ventral thecal sac without significant canal stenosis. Foramina are widely patent.  L2-3: Diffuse disc bulge with disc desiccation. Associated annular fissure posteriorly. There is bony retropulsion of approximately 2 mm of the anterior aspect of the posterior L2 vertebral body related to the chronic compression fracture. Mild lateral recess crowding bilaterally. No significant foraminal narrowing.  L3-4: Mild diffuse annular disc bulge with disc desiccation. Associated posterior annular fissure. Mild facet and ligamentous hypertrophy. No focal disc herniation. No significant canal or foraminal stenosis.  L4-5: Mild diffuse disc bulge with disc desiccation. Associated annular fissure posteriorly. No focal disc herniation. Mild to  moderate bilateral facet arthrosis with ligamentous hypertrophy. There is resultant mild lateral recess stenosis bilaterally. Foramina remain widely patent.  L5-S1: Mild diffuse disc bulge with disc desiccation. Posterior annular fissure. No significant canal or foraminal stenosis. No focal disc herniation.  Nonenhancing nerve root sleeve cyst noted posterior to the S2 segment.  IMPRESSION: MRI THORACIC SPINE IMPRESSION:  1. Acute anterior compression fracture of T5 with approximately 80% of anterior height loss with 2 mm of bony retropulsion. No significant stenosis. This as of worsened relative to prior CT from 03/23/2015. 2. Additional acute compression fracture through the inferior endplate of T4 with approximately 10-20% of central height loss without bony retropulsion. 3. Central/left paracentral disc protrusion at T4-5 with resultant mild canal stenosis, likely related to adjacent compression fractures. 4. Small central disc protrusion at T6-7 without stenosis. 5. Small bilateral pleural effusions with associated bibasilar atelectasis.  MRI LUMBAR SPINE IMPRESSION:  1. No acute abnormality within the lumbar spine. Previously noted compression fracture of L2 is chronic. 2. Mild multilevel degenerative spondylolysis and facet arthrosis as detailed above. No significant  stenosis within the lumbar spine.   Electronically Signed   By: Jeannine Boga M.D.   On: 05/05/2015 22:25   Dg Chest Port 1 View  05/04/2015   CLINICAL DATA:  Pneumonia  EXAM: PORTABLE CHEST - 1 VIEW  COMPARISON:  05/02/2015  FINDINGS: There is mild worsening of interstitial and alveolar opacities. No large effusion is evident. No pneumothorax is evident.  IMPRESSION: Mildly worsened interstitial and alveolar opacities bilaterally.   Electronically Signed   By: Andreas Newport M.D.   On: 05/04/2015 05:35   Dg Chest Port 1 View  05/02/2015   CLINICAL DATA:  Hypoxia.  Shortness of breath.  EXAM: PORTABLE CHEST - 1 VIEW  COMPARISON:   05/01/2015  FINDINGS: Stable abnormal bilateral interstitial accentuation with minimally improved airspace opacities above the right minor fissure and in the right middle lobe. Thickening of the minor fissure may reflect some pleural fluid on the right. Heart size within normal limits for technique.  The patient is rotated to the right on today's radiograph, reducing diagnostic sensitivity and specificity. There is considerable underlying emphysema.  IMPRESSION: 1. Emphysema with superimposed diffuse interstitial accentuation, and mild airspace opacities in the right upper lobe and right middle lobe. There is no current cardiomegaly, raising the possibilities of atypical pneumonia, noncardiogenic edema, or drug reaction superimposed on underlying emphysema. The right basilar airspace opacities have minimally improved compared to yesterday. 2. Suspected trace right pleural effusion.   Electronically Signed   By: Van Clines M.D.   On: 05/02/2015 08:33    Labs:  CBC:  Recent Labs  05/03/15 0405 05/05/15 0346 05/06/15 0533 05/10/15 0837  WBC 14.4* 14.0* 11.3* 13.5*  HGB 12.3 11.6* 11.6* 11.9*  HCT 40.4 35.5* 36.7 38.2  PLT 310 269 230 229    COAGS:  Recent Labs  12/26/14 0517 05/10/15 0837  INR 1.01 0.94  APTT 26 20*    BMP:  Recent Labs  05/02/15 0544 05/03/15 0405 05/05/15 0346 05/06/15 0533  NA 141 143 134* 139  K 3.9 4.7 3.6 3.4*  CL 102 102 94* 97*  CO2 32 31 35* 36*  GLUCOSE 71 191* 113* 85  BUN 39* 31* 23* 14  CALCIUM 9.5 10.0 9.0 9.1  CREATININE 0.79 0.82 0.57 0.49  GFRNONAA >60 >60 >60 >60  GFRAA >60 >60 >60 >60    LIVER FUNCTION TESTS:  Recent Labs  11/23/14 1441 12/26/14 0517 02/01/15 1230 05/01/15 1535  BILITOT 0.48 1.4* 0.6 0.7  AST 24 43* 20 30  ALT $Re'18 19 18 24  'yfo$ ALKPHOS 72 60 69 72  PROT 6.3* 5.0* 5.9* 5.6*  ALBUMIN 3.6 3.1* 3.8 2.5*    TUMOR MARKERS: No results for input(s): AFPTM, CEA, CA199, CHROMGRNA in the last 8760  hours.  Assessment and Plan:  Back pain x 4 -5 weeks Pain meds without relief New fractures at Thoracic 4 and 5 per MRI Now scheduled for KP/VP  Risks and Benefits discussed with the patient and dtr including, but not limited to education regarding the natural healing process of compression fractures without intervention, bleeding, infection, cement migration which may cause spinal cord damage, paralysis, pulmonary embolism or even death. All of the patient's and family questions were answered, patient is agreeable to proceed. Consent signed and in chart.    Thank you for this interesting consult.  I greatly enjoyed meeting Hailey Keith and look forward to participating in their care.  A copy of this report was sent to the requesting provider on  this date.  Signed: Rosalio Catterton A 05/10/2015, 9:00 AM   I spent a total of  30 Minutes   in face to face in clinical consultation, greater than 50% of which was counseling/coordinating care for KP/VP T4 and 5

## 2015-05-10 NOTE — Sedation Documentation (Signed)
MD aware of O2sats of 78%. Pt on 3L home O2. Pt currently A+Ox3. No other VS compensation.

## 2015-05-10 NOTE — Procedures (Signed)
S/P T4 and T5 VP

## 2015-05-11 ENCOUNTER — Ambulatory Visit: Payer: Medicare Other | Admitting: Internal Medicine

## 2015-05-11 ENCOUNTER — Telehealth: Payer: Self-pay | Admitting: Internal Medicine

## 2015-05-11 NOTE — Telephone Encounter (Signed)
returned call and r/s appt per pt dtr request....pt ok and aware

## 2015-05-21 ENCOUNTER — Other Ambulatory Visit: Payer: Self-pay | Admitting: Internal Medicine

## 2015-05-31 DIAGNOSIS — J449 Chronic obstructive pulmonary disease, unspecified: Secondary | ICD-10-CM | POA: Diagnosis not present

## 2015-06-01 ENCOUNTER — Telehealth (HOSPITAL_COMMUNITY): Payer: Self-pay

## 2015-06-01 ENCOUNTER — Other Ambulatory Visit: Payer: Self-pay | Admitting: Physician Assistant

## 2015-06-01 NOTE — Telephone Encounter (Signed)
Called to schedule f/u appt with Dr. Estanislado Pandy. Pt's daughter stated that she was doing great and they did not want to come in for a f/u. I told them to just give [place] a call if things change and we can schedule an appointment. AW

## 2015-06-06 ENCOUNTER — Ambulatory Visit (HOSPITAL_BASED_OUTPATIENT_CLINIC_OR_DEPARTMENT_OTHER): Payer: Medicare Other | Admitting: Internal Medicine

## 2015-06-06 ENCOUNTER — Telehealth: Payer: Self-pay | Admitting: Internal Medicine

## 2015-06-06 ENCOUNTER — Encounter: Payer: Self-pay | Admitting: Internal Medicine

## 2015-06-06 VITALS — BP 126/59 | HR 94 | Temp 98.3°F | Resp 18 | Ht 64.0 in | Wt 140.8 lb

## 2015-06-06 DIAGNOSIS — C3412 Malignant neoplasm of upper lobe, left bronchus or lung: Secondary | ICD-10-CM

## 2015-06-06 DIAGNOSIS — C3490 Malignant neoplasm of unspecified part of unspecified bronchus or lung: Secondary | ICD-10-CM

## 2015-06-06 NOTE — Telephone Encounter (Signed)
per pof to sch pt appt-gave pt copy of avs-adv pt [facility] will call to sch pt scan

## 2015-06-06 NOTE — Progress Notes (Signed)
[place] [phone]   Fax:([phone]) [place], [place] [address] [place] [address]  DIAGNOSIS: Stage IIB (T3, N0, M0) non-small cell lung cancer, squamous cell carcinoma of the left upper lung diagnosed in August of 2014.  PRIOR THERAPY: Curative radiotherapy to the left upper lobe lung mass under the care of Dr. Valere Dross completed on 07/23/2013.  CURRENT THERAPY: Observation.  INTERVAL HISTORY: Hailey Keith 80 y.o. female returns to the clinic today for followup visit accompanied by her daughter and granddaughter. The patient is doing very well today with no specific complaints except for the baseline shortness of breath increased with exertion. She underwent vertebroplasty for compression fracture of the T4 and T5 by interventional radiology on 05/16/2015. Her back pain improved compared to 2 weeks ago. She had CT angiogram of the chest performed on 03/23/2015 that showed no evidence for disease progression. She was also treated in late August 2016 for community-acquired pneumonia. She denied having any significant chest pain , cough or hemoptysis. She denied having any fever or chills, no nausea or vomiting. The patient is here today for evaluation and discussion of her condition.  MEDICAL HISTORY: Past Medical History  Diagnosis Date  . Hypertension   . Dyslipidemia   . Hard of hearing   . Hx of radiation therapy 06/18/13- 07/23/13    LUL lung primary, 7020 cGy 26 sessions  . Lung cancer ([place]) 05/20/13    LUL, squamous cell  . Skin cancer     basal cell  . COPD (chronic obstructive pulmonary disease) ([place])   . prediabetes     ALLERGIES:  is allergic to paxil.  MEDICATIONS:  Current Outpatient Prescriptions  Medication Sig Dispense Refill  . aspirin 81 MG tablet Take 81 mg by mouth daily.    Marland Kitchen atorvastatin (LIPITOR) 80 MG tablet TAKE 1 TABLET EVERY DAY FOR CHOLESTEROL 90  tablet 0  . citalopram (CELEXA) 40 MG tablet TAKE 1 TABLET BY MOUTH DAILY FOR MOOD 90 tablet 2  . doxazosin (CARDURA) 8 MG tablet TAKE 1 TABLET AT BEDTIME 90 tablet 0  . guaifenesin (HUMIBID E) 400 MG TABS tablet Take 400 mg by mouth 2 (two) times daily.    Marland Kitchen ipratropium-albuterol (DUONEB) 0.5-2.5 (3) MG/3ML SOLN TAKE 3 MLS BY NEBULIZATION 4 (FOUR) TIMES DAILY. 360 mL 8  . losartan (COZAAR) 100 MG tablet Take 100 mg by mouth daily.    Marland Kitchen oxyCODONE-acetaminophen (PERCOCET/ROXICET) 5-325 MG per tablet Take two tablets by mouth every four hours as needed for pain. Do not exceed 4gms of Tylenol in 24 hours 60 tablet 0  . predniSONE (DELTASONE) 5 MG tablet Take 5 mg by mouth 2 (two) times daily with a meal.    . PROAIR HFA 108 (90 BASE) MCG/ACT inhaler 1 TO 2 INHALATIONS EVERY 4 HOURS AS NEEDED FOR RESCUE ASTHMA 8.5 Inhaler 3  . Vitamin D, Ergocalciferol, (DRISDOL) 50000 UNITS CAPS capsule TAKE ONE CAPSULE EVERY DAY 30 capsule 2  . albuterol (PROVENTIL) (2.5 MG/3ML) 0.083% nebulizer solution Take 3 mLs (2.5 mg total) by nebulization every 4 (four) hours as needed for wheezing. (Patient not taking: Reported on 06/06/2015) 75 mL 1  . Oxycodone HCl 10 MG TABS Take 10 mg by mouth every 4 (four) hours as needed (for pain).     No current facility-administered medications for this visit.    SURGICAL HISTORY:  Past Surgical History  Procedure Laterality Date  .  Tonsillectomy      as child, repeat surgery early 20's  . Mohs surgery      "several times"  . Facial reconstruction surgery  25 years ago    to remove skin cancer, right side of face    REVIEW OF SYSTEMS:  A comprehensive review of systems was negative except for: Respiratory: positive for dyspnea on exertion   PHYSICAL EXAMINATION: General appearance: alert, cooperative and no distress Head: Normocephalic, without obvious abnormality, atraumatic Neck: no adenopathy, no JVD, supple, symmetrical, trachea midline and thyroid not enlarged,  symmetric, no tenderness/mass/nodules Lymph nodes: Cervical, supraclavicular, and axillary nodes normal. Resp: clear to auscultation bilaterally Back: symmetric, no curvature. ROM normal. No CVA tenderness. Cardio: regular rate and rhythm, S1, S2 normal, no murmur, click, rub or gallop GI: soft, non-tender; bowel sounds normal; no masses,  no organomegaly Extremities: extremities normal, atraumatic, no cyanosis or edema  ECOG PERFORMANCE STATUS: 2 - Symptomatic, <50% confined to bed  Blood pressure 126/59, pulse 94, temperature 98.3 F (36.8 C), temperature source Oral, resp. rate 18, height '5\' 4"'$  (1.626 m), weight 140 lb 12.8 oz (63.866 kg), SpO2 96 %.  LABORATORY DATA: Lab Results  Component Value Date   WBC 13.5* 05/10/2015   HGB 11.9* 05/10/2015   HCT 38.2 05/10/2015   MCV 93.9 05/10/2015   PLT 229 05/10/2015      Chemistry      Component Value Date/Time   NA 136 05/10/2015 0837   NA 142 01/05/2015   NA 143 11/23/2014 1441   K 5.1 05/10/2015 0837   K 4.8 11/23/2014 1441   CL 98* 05/10/2015 0837   CO2 34* 05/10/2015 0837   CO2 28 11/23/2014 1441   BUN 9 05/10/2015 0837   BUN 16 01/05/2015   BUN 15.4 11/23/2014 1441   CREATININE 0.62 05/10/2015 0837   CREATININE 0.68 02/01/2015 1230   CREATININE 0.7 01/05/2015   CREATININE 0.7 11/23/2014 1441   GLU 83 01/05/2015      Component Value Date/Time   CALCIUM 8.9 05/10/2015 0837   CALCIUM 9.7 11/23/2014 1441   ALKPHOS 72 05/01/2015 1535   ALKPHOS 72 11/23/2014 1441   AST 30 05/01/2015 1535   AST 24 11/23/2014 1441   ALT 24 05/01/2015 1535   ALT 18 11/23/2014 1441   BILITOT 0.7 05/01/2015 1535   BILITOT 0.48 11/23/2014 1441       RADIOGRAPHIC STUDIES: Ir Vertebroplasty Cervicothoracic Inj  05/16/2015   CLINICAL DATA:  Patient with severely painful compression fractures at T4 and T5.  EXAM: IR VERTEBROPLASTY ADDL INJECTION; IR VERTEBROPLASTY CERVICOTHORACIC INJ AT T4 AND T5  MEDICATIONS: Versed 0.5 mg IV, Fentanyl  50 mcg IV.  ANESTHESIA/SEDATION: Total Moderate Sedation Time:  45 minutes.  FLUOROSCOPY TIME:  17.1 minutes.  PROCEDURE: Following a full explanation of the procedure along with the potential associated complications, an informed witnessed consent was obtained.  The patient was placed prone on the fluoroscopic table. Nasal oxygen was administered. Physiologic monitoring was performed throughout the duration of the procedure. The skin overlying the thoracic region was prepped and draped in the usual sterile fashion. The T4 and T5 vertebral bodies were identified and the right pedicle at T4, and left pedicle at T5 were infiltrated with 0.25% Bupivacaine. This was then followed by the advancement of a 13-gauge Cook needle through the plate to the pedicle into the anterior one-third at T4 and T5. A gentle contrast injection demonstrated a trabecular pattern of contrast and paraspinous veins. This necessitated the  use of Gel-Foam pledgets prior to the injection of the methylmethacrylate mixture.  At this time, methylmethacrylate mixture was reconstituted. Under biplane intermittent fluoroscopy, the methylmethacrylate was then injected into the T4 and T5 vertebral bodies with filling of the vertebral bodies at these 2 levels.  No extravasation was noted into the disk spaces or posteriorly into the spinal canal. No epidural venous contamination was seen.  The needles were then removed. Hemostasis was achieved at the skin entry site.  There were no acute complications. Patient tolerated the procedure well. The patient was observed for 3 hours and discharged in good condition.  IMPRESSION: Status post vertebral body augmentation for painful compression fractures at T4 and T5 using vertebroplasty technique.   Electronically Signed   By: Luanne Bras M.D.   On: 05/11/2015 09:07   Ir Vertebroplasty Add'l Injection  05/16/2015   CLINICAL DATA:  Patient with severely painful compression fractures at T4 and T5.  EXAM: IR  VERTEBROPLASTY ADDL INJECTION; IR VERTEBROPLASTY CERVICOTHORACIC INJ AT T4 AND T5  MEDICATIONS: Versed 0.5 mg IV, Fentanyl 50 mcg IV.  ANESTHESIA/SEDATION: Total Moderate Sedation Time:  45 minutes.  FLUOROSCOPY TIME:  17.1 minutes.  PROCEDURE: Following a full explanation of the procedure along with the potential associated complications, an informed witnessed consent was obtained.  The patient was placed prone on the fluoroscopic table. Nasal oxygen was administered. Physiologic monitoring was performed throughout the duration of the procedure. The skin overlying the thoracic region was prepped and draped in the usual sterile fashion. The T4 and T5 vertebral bodies were identified and the right pedicle at T4, and left pedicle at T5 were infiltrated with 0.25% Bupivacaine. This was then followed by the advancement of a 13-gauge Cook needle through the plate to the pedicle into the anterior one-third at T4 and T5. A gentle contrast injection demonstrated a trabecular pattern of contrast and paraspinous veins. This necessitated the use of Gel-Foam pledgets prior to the injection of the methylmethacrylate mixture.  At this time, methylmethacrylate mixture was reconstituted. Under biplane intermittent fluoroscopy, the methylmethacrylate was then injected into the T4 and T5 vertebral bodies with filling of the vertebral bodies at these 2 levels.  No extravasation was noted into the disk spaces or posteriorly into the spinal canal. No epidural venous contamination was seen.  The needles were then removed. Hemostasis was achieved at the skin entry site.  There were no acute complications. Patient tolerated the procedure well. The patient was observed for 3 hours and discharged in good condition.  IMPRESSION: Status post vertebral body augmentation for painful compression fractures at T4 and T5 using vertebroplasty technique.   Electronically Signed   By: Luanne Bras M.D.   On: 05/11/2015 09:07    ASSESSMENT AND  PLAN: This is a very pleasant 80 years old white female with history of stage IIB non-small cell lung cancer, squamous cell carcinoma status post curative radiotherapy to the left upper lobe lung mass with partial response. Her last CT angiogram of the chest on 03/23/2015 showed no evidence for disease progression. I discussed the scan results with the patient and her daughter.  I recommended for her to continue on observation for now with repeat CT scan of the chest in 4 months. She was advised to call immediately if she has any concerning symptoms in the interval. The patient voices understanding of current disease status and treatment options and is in agreement with the current care plan.  All questions were answered. The patient knows to call  the clinic with any problems, questions or concerns. We can certainly see the patient much sooner if necessary.  Disclaimer: This note was dictated with voice recognition software. Similar sounding words can inadvertently be transcribed and may not be corrected upon review.

## 2015-06-09 ENCOUNTER — Other Ambulatory Visit: Payer: Self-pay | Admitting: Internal Medicine

## 2015-06-23 DIAGNOSIS — C44329 Squamous cell carcinoma of skin of other parts of face: Secondary | ICD-10-CM | POA: Diagnosis not present

## 2015-06-23 DIAGNOSIS — D485 Neoplasm of uncertain behavior of skin: Secondary | ICD-10-CM | POA: Diagnosis not present

## 2015-06-23 DIAGNOSIS — L570 Actinic keratosis: Secondary | ICD-10-CM | POA: Diagnosis not present

## 2015-06-23 DIAGNOSIS — D0439 Carcinoma in situ of skin of other parts of face: Secondary | ICD-10-CM | POA: Diagnosis not present

## 2015-06-23 DIAGNOSIS — C44311 Basal cell carcinoma of skin of nose: Secondary | ICD-10-CM | POA: Diagnosis not present

## 2015-06-23 DIAGNOSIS — L821 Other seborrheic keratosis: Secondary | ICD-10-CM | POA: Diagnosis not present

## 2015-06-27 ENCOUNTER — Encounter: Payer: Self-pay | Admitting: Internal Medicine

## 2015-06-27 ENCOUNTER — Ambulatory Visit (INDEPENDENT_AMBULATORY_CARE_PROVIDER_SITE_OTHER): Payer: Medicare Other | Admitting: Internal Medicine

## 2015-06-27 VITALS — BP 122/78 | HR 80 | Temp 97.5°F | Resp 18 | Ht 63.5 in | Wt 148.4 lb

## 2015-06-27 DIAGNOSIS — R7309 Other abnormal glucose: Secondary | ICD-10-CM | POA: Diagnosis not present

## 2015-06-27 DIAGNOSIS — Z0001 Encounter for general adult medical examination with abnormal findings: Secondary | ICD-10-CM

## 2015-06-27 DIAGNOSIS — Z789 Other specified health status: Secondary | ICD-10-CM

## 2015-06-27 DIAGNOSIS — Z1389 Encounter for screening for other disorder: Secondary | ICD-10-CM

## 2015-06-27 DIAGNOSIS — I10 Essential (primary) hypertension: Secondary | ICD-10-CM

## 2015-06-27 DIAGNOSIS — Z79899 Other long term (current) drug therapy: Secondary | ICD-10-CM | POA: Diagnosis not present

## 2015-06-27 DIAGNOSIS — Z23 Encounter for immunization: Secondary | ICD-10-CM | POA: Diagnosis not present

## 2015-06-27 DIAGNOSIS — Z6825 Body mass index (BMI) 25.0-25.9, adult: Secondary | ICD-10-CM

## 2015-06-27 DIAGNOSIS — E782 Mixed hyperlipidemia: Secondary | ICD-10-CM | POA: Diagnosis not present

## 2015-06-27 DIAGNOSIS — Z1212 Encounter for screening for malignant neoplasm of rectum: Secondary | ICD-10-CM

## 2015-06-27 DIAGNOSIS — Z9181 History of falling: Secondary | ICD-10-CM

## 2015-06-27 DIAGNOSIS — Z1331 Encounter for screening for depression: Secondary | ICD-10-CM

## 2015-06-27 DIAGNOSIS — J449 Chronic obstructive pulmonary disease, unspecified: Secondary | ICD-10-CM | POA: Diagnosis not present

## 2015-06-27 DIAGNOSIS — C3490 Malignant neoplasm of unspecified part of unspecified bronchus or lung: Secondary | ICD-10-CM

## 2015-06-27 DIAGNOSIS — F329 Major depressive disorder, single episode, unspecified: Secondary | ICD-10-CM | POA: Diagnosis not present

## 2015-06-27 DIAGNOSIS — E559 Vitamin D deficiency, unspecified: Secondary | ICD-10-CM

## 2015-06-27 DIAGNOSIS — R6889 Other general symptoms and signs: Secondary | ICD-10-CM | POA: Diagnosis not present

## 2015-06-27 DIAGNOSIS — R7303 Prediabetes: Secondary | ICD-10-CM | POA: Diagnosis not present

## 2015-06-27 DIAGNOSIS — F32A Depression, unspecified: Secondary | ICD-10-CM

## 2015-06-27 DIAGNOSIS — Z0000 Encounter for general adult medical examination without abnormal findings: Secondary | ICD-10-CM | POA: Insufficient documentation

## 2015-06-27 LAB — CBC WITH DIFFERENTIAL/PLATELET
BASOS PCT: 0 % (ref 0–1)
Basophils Absolute: 0 10*3/uL (ref 0.0–0.1)
Eosinophils Absolute: 0.2 10*3/uL (ref 0.0–0.7)
Eosinophils Relative: 2 % (ref 0–5)
HCT: 40.1 % (ref 36.0–46.0)
HEMOGLOBIN: 12.8 g/dL (ref 12.0–15.0)
LYMPHS ABS: 2.8 10*3/uL (ref 0.7–4.0)
Lymphocytes Relative: 24 % (ref 12–46)
MCH: 29.3 pg (ref 26.0–34.0)
MCHC: 31.9 g/dL (ref 30.0–36.0)
MCV: 91.8 fL (ref 78.0–100.0)
MONOS PCT: 10 % (ref 3–12)
MPV: 10 fL (ref 8.6–12.4)
Monocytes Absolute: 1.2 10*3/uL — ABNORMAL HIGH (ref 0.1–1.0)
NEUTROS ABS: 7.6 10*3/uL (ref 1.7–7.7)
NEUTROS PCT: 64 % (ref 43–77)
Platelets: 219 10*3/uL (ref 150–400)
RBC: 4.37 MIL/uL (ref 3.87–5.11)
RDW: 15.5 % (ref 11.5–15.5)
WBC: 11.8 10*3/uL — ABNORMAL HIGH (ref 4.0–10.5)

## 2015-06-27 LAB — TSH: TSH: 1.14 u[IU]/mL (ref 0.350–4.500)

## 2015-06-27 NOTE — Progress Notes (Signed)
Patient ID: Hailey Keith, female   DOB: [birth year], 80 y.o.   MRN: [id number]  Medicare  Annual Preventative Visit And Comprehensive Evaluation,  Examination & Management     This very nice 80 y.o.DWF presents for presents for a  presents for a M/C Preventative Visit & comprehensive evaluation and management of multiple medical co-morbidities.  Patient has been followed for HTN, Prediabetes, Hyperlipidemia, COPD, hx/o Lung Cancer and Vitamin D Deficiency.     Patient has long standing COPD and is currently on continuous O2 at 2 LPM and this maintain her O2 sat's in the low 90's, but today in the office at rest off of O2 her O2 sat was 85. Patient was dx'd with a LUL squamous cell lung Ca dx'd in Aug 2014 and tx'd by curative Radiotherapy completed Nov 2014 and is followed expectantly by Dr Julien Nordmann. Patient relates she quit smoking in Aug 2016 after being admitted with a CAP. Of note patient has also had prior hx/o excision of skin cancers of her face.       HTN predates since 64. Patient's BP has been controlled at home and patient denies any cardiac symptoms as chest pain, palpitations, shortness of breath, dizziness or ankle swelling. Today's BP: 122/78 mmHg      Patient's hyperlipidemia is controlled with diet and medications. Patient denies myalgias or other medication SE's. Last lipids were at goal - Cholesterol 164; HDL 62; LDL 87; Triglycerides 74 on 11/02/2014.     Patient has prediabetes predating since 2013 with A1c 6.1% and patient denies reactive hypoglycemic symptoms, visual blurring, diabetic polys, or paresthesias. Last A1c was still 6.1% on 11/02/2014.     Finally, patient has history of Vitamin D Deficiency of "18" in 2008 and last Vitamin D was 89 on 11/02/2014.     Medication Sig  . albuterol  (2.5 MG/3ML) 0.083% neb soln Take 3 mLs (2.5 mg total) by nebulization every 4 (four) hours as needed for wheezing.  Marland Kitchen aspirin 81 MG tablet Take 81 mg by mouth daily.  Marland Kitchen atorvastatin  (LIPITOR) 80 MG tablet TAKE 1 TABLET EVERY DAY FOR CHOLESTEROL  . citalopram (CELEXA) 40 MG tablet TAKE 1 TABLET BY MOUTH DAILY FOR MOOD  . doxazosin (CARDURA) 8 MG tablet TAKE 1 TABLET AT BEDTIME  . guaifenesin (HUMIBID E) 400 MG TABS t Take 400 mg by mouth 2 (two) times daily.  Marland Kitchen ipratropium-albuterol (DUONEB) 0.5-2.5 (3) MG/3ML SOLN TAKE 3 MLS BY NEBULIZATION 4 (FOUR) TIMES DAILY.  Marland Kitchen losartan (COZAAR) 100 MG tablet Take 100 mg by mouth daily.  . Oxycodone HCl 10 MG TABS Take 10 mg by mouth every 4 (four) hours as needed (for pain).  Richardo Hanks  5-325  Take two tablets by mouth every four hours as needed for pain. Do not exceed 4gms of Tylenol in 24 hours  . predniSONE  5 MG tablet Take 5 mg by mouth 2 (two) times daily with a meal.  . PROAIR HFA  inhaler 1 TO 2 INHALATIONS EVERY 4 HOURS AS NEEDED FOR RESCUE ASTHMA  . Vitamin D 50,000 UNITS CAPS  TAKE ONE CAPSULE EVERY DAY  . ipratropium-albuterol (DUONEB) 0.5-2.5 (3) MG/3ML SOLN TAKE 3 MLS BY NEBULIZATION 4 (FOUR) TIMES DAILY.   Allergies  Allergen Reactions  . Paxil [Paroxetine Hcl] Other (See Comments)    UNKNOWN   Past Medical History  Diagnosis Date  . Hypertension   . Dyslipidemia   . Hard of hearing   . Hx of radiation therapy  06/18/13- 07/23/13    LUL lung primary, 7020 cGy 26 sessions  . Lung cancer ([place]) 05/20/13    LUL, squamous cell  . Skin cancer     basal cell  . COPD (chronic obstructive pulmonary disease) ([place])   . prediabetes    Health Maintenance  Topic Date Due  . FOOT EXAM  04/01/2015  . URINE MICROALBUMIN  04/01/2015  . INFLUENZA VACCINE  04/04/2015  . HEMOGLOBIN A1C  05/05/2015  . OPHTHALMOLOGY EXAM  11/08/2015  . TETANUS/TDAP  03/20/2022  . DEXA SCAN  Completed  . ZOSTAVAX  Completed  . PNA vac Low Risk Adult  Completed   Immunization History  Administered Date(s) Administered  . Influenza, High Dose Seasonal PF 07/22/2014, 06/27/2015  . Influenza,inj,Quad PF,36+ Mos 05/14/2013  . Pneumococcal  Conjugate-13 04/29/2014  . Pneumococcal Polysaccharide-23 07/21/2009  . Td 03/20/2012  . Zoster 09/04/2009   Past Surgical History  Procedure Laterality Date  . Tonsillectomy      as child, repeat surgery early 20's  . Mohs surgery      "several times"  . Facial reconstruction surgery  25 years ago    to remove skin cancer, right side of face   Family History  Problem Relation Age of Onset  . Hypertension    . Lung cancer Sister     tx w/radiation, met to stomach  . Pneumonia Mother     Deceased  . Heart attack Father     Deceased  . Cancer Sister     "back of neck"   Social History  Substance Use Topics  . Smoking status: Former Smoker -- 0.50 packs/day for 60 years    Types: Cigarettes    Quit date: 05/12/2015  . Smokeless tobacco: None     Comment: 06/05/13 currently 1/2 PPD, trying to quit  . Alcohol Use: Yes     Comment: occ wine, 2 drinks nightly    ROS Constitutional: Denies fever, chills, weight loss/gain, headaches, insomnia,  night sweats, and change in appetite. Does c/o fatigue. Eyes: Denies redness, blurred vision, diplopia, discharge, itchy, watery eyes.  ENT: Denies discharge, congestion, post nasal drip, epistaxis, sore throat, earache, hearing loss, dental pain, Tinnitus, Vertigo, Sinus pain, snoring.  Cardio: Denies chest pain, palpitations, irregular heartbeat, syncope, dyspnea, diaphoresis, orthopnea, PND, claudication, edema Respiratory: denies cough, dyspnea, DOE, pleurisy, hoarseness, laryngitis, wheezing.  Gastrointestinal: Denies dysphagia, heartburn, reflux, water brash, pain, cramps, nausea, vomiting, bloating, diarrhea, constipation, hematemesis, melena, hematochezia, jaundice, hemorrhoids Genitourinary: Denies dysuria, frequency, urgency, nocturia, hesitancy, discharge, hematuria, flank pain Breast: Breast lumps, nipple discharge, bleeding.  Musculoskeletal: Denies arthralgia, myalgia, stiffness, Jt. Swelling, pain, limp, and strain/sprain.  Denies falls. Skin: Denies puritis, rash, hives, warts, acne, eczema, changing in skin lesion Neuro: No weakness, tremor, incoordination, spasms, paresthesia, pain Psychiatric: Denies confusion, memory loss, sensory loss. Denies Depression. Endocrine: Denies change in weight, skin, hair change, nocturia, and paresthesia, diabetic polys, visual blurring, hyper / hypo glycemic episodes.  Heme/Lymph: No excessive bleeding, bruising, enlarged lymph nodes.  Physical Exam  BP 122/78 mmHg  Pulse 80  Temp(Src) 97.5 F (36.4 C)  Resp 18  Ht 5' 3.5" (1.613 m)  Wt 148 lb 6.4 oz (67.314 kg)  BMI 25.87 kg/m2  General Appearance: Well nourished and in no apparent distress. Eyes: PERRLA, EOMs, conjunctiva no swelling or erythema, normal fundi and vessels. Sinuses: No frontal/maxillary tenderness ENT/Mouth: EACs patent / TMs  nl. Nares clear without erythema, swelling, mucoid exudates. Oral hygiene is good. No erythema, swelling, or  exudate. Tongue normal, non-obstructing. Tonsils not swollen or erythematous. Hearing normal.  Neck: Supple, thyroid normal. No bruits, nodes or JVD. Respiratory: Respiratory effort normal.  BS equal and clear bilateral without rales, rhonci, wheezing or stridor. Cardio: Heart sounds are normal with regular rate and rhythm and no murmurs, rubs or gallops. Peripheral pulses are normal and equal bilaterally without edema. No aortic or femoral bruits. Chest: symmetric with normal excursions and percussion. Breasts: Symmetric, without lumps, nipple discharge, retractions, or fibrocystic changes.  Abdomen: Flat, soft, with bowel sounds. Nontender, no guarding, rebound, hernias, masses, or organomegaly.  Lymphatics: Non tender without lymphadenopathy.  Musculoskeletal: Full ROM all peripheral extremities, joint stability, 5/5 strength, and normal gait. Skin: Warm and dry without rashes, lesions, cyanosis, clubbing or  ecchymosis.  Neuro: Cranial nerves intact, reflexes equal  bilaterally. Normal muscle tone, no cerebellar symptoms. Sensation intact by monofilament testing to the toes bilaterally.  Pysch: Alert and oriented X 3, normal affect, Insight and Judgment appropriate.   Assessment and Plan  1. Encounter for general adult medical examination with abnormal findings   2. Essential hypertension  - Microalbumin / creatinine urine ratio - EKG 12-Lead - [place], RETROPERITNL ABD,  LTD  3. Mixed hyperlipidemia   4. Prediabetes  - Hemoglobin A1c - Insulin, random  5. Vitamin D deficiency  - Vit D  25 hydroxy   6. COPD mixed type ([place])   7. Depression, controlled   8. Squamous cell carcinoma of bronchus   9. Screening for rectal cancer  - POC Hemoccult Bld/Stl   10. Depression screen   11. Need for prophylactic vaccination and inoculation against influenza  - Flu vaccine HIGH DOSE PF (Fluzone High dose)  12. Medication management  - Urinalysis, Routine w reflex microscopic  - CBC with Differential/Platelet - BASIC METABOLIC PANEL WITH GFR - Hepatic function panel - Magnesium - Lipid panel - TSH   Continue prudent diet as discussed, weight control, BP monitoring, regular exercise, and medications. Discussed med's effects and SE's. Screening labs and tests as requested with regular follow-up as recommended.  Over 40 minutes of exam, counseling, chart review was performed.

## 2015-06-27 NOTE — Patient Instructions (Signed)

## 2015-06-28 LAB — LIPID PANEL
CHOL/HDL RATIO: 2.1 ratio (ref <=5.0)
Cholesterol: 168 mg/dL (ref 125–200)
HDL: 81 mg/dL (ref >=46)
LDL CALC: 72 mg/dL (ref <130)
Triglycerides: 74 mg/dL (ref <150)
VLDL: 15 mg/dL (ref <30)

## 2015-06-28 LAB — BASIC METABOLIC PANEL WITH GFR
BUN: 13 mg/dL (ref 7–25)
CHLORIDE: 100 mmol/L (ref 98–110)
CO2: 34 mmol/L — AB (ref 20–31)
CREATININE: 0.63 mg/dL (ref 0.60–0.93)
Calcium: 9.5 mg/dL (ref 8.6–10.4)
GFR, Est African American: >89 mL/min (ref >=60)
GFR, Est Non African American: 86 mL/min (ref >=60)
Glucose, Bld: 71 mg/dL (ref 65–99)
Potassium: 4 mmol/L (ref 3.5–5.3)
SODIUM: 141 mmol/L (ref 135–146)

## 2015-06-28 LAB — HEMOGLOBIN A1C
HEMOGLOBIN A1C: 6.4 % — AB (ref <5.7)
Mean Plasma Glucose: 137 mg/dL — ABNORMAL HIGH (ref <117)

## 2015-06-28 LAB — HEPATIC FUNCTION PANEL
ALT: 14 U/L (ref 6–29)
AST: 17 U/L (ref 10–35)
Albumin: 3.6 g/dL (ref 3.6–5.1)
Alkaline Phosphatase: 59 U/L (ref 33–130)
BILIRUBIN DIRECT: 0.1 mg/dL (ref <=0.2)
Indirect Bilirubin: 0.5 mg/dL (ref 0.2–1.2)
Total Bilirubin: 0.6 mg/dL (ref 0.2–1.2)
Total Protein: 5.6 g/dL — ABNORMAL LOW (ref 6.1–8.1)

## 2015-06-28 LAB — URINALYSIS, ROUTINE W REFLEX MICROSCOPIC
Bilirubin Urine: NEGATIVE
GLUCOSE, UA: NEGATIVE
HGB URINE DIPSTICK: NEGATIVE
Ketones, ur: NEGATIVE
LEUKOCYTES UA: NEGATIVE
Nitrite: NEGATIVE
PH: 7.5 (ref 5.0–8.0)
Protein, ur: NEGATIVE
Specific Gravity, Urine: 1.014 (ref 1.001–1.035)

## 2015-06-28 LAB — MICROALBUMIN / CREATININE URINE RATIO
Creatinine, Urine: 64 mg/dL (ref 20–320)
MICROALB UR: 0.5 mg/dL
Microalb Creat Ratio: 8 mcg/mg creat (ref <30)

## 2015-06-28 LAB — INSULIN, RANDOM: INSULIN: 5.4 u[IU]/mL (ref 2.0–19.6)

## 2015-06-28 LAB — MAGNESIUM: MAGNESIUM: 1.9 mg/dL (ref 1.5–2.5)

## 2015-06-28 LAB — VITAMIN D 25 HYDROXY (VIT D DEFICIENCY, FRACTURES): VIT D 25 HYDROXY: 89 ng/mL (ref 30–100)

## 2015-06-29 ENCOUNTER — Other Ambulatory Visit: Payer: Self-pay | Admitting: Internal Medicine

## 2015-06-30 DIAGNOSIS — J449 Chronic obstructive pulmonary disease, unspecified: Secondary | ICD-10-CM | POA: Diagnosis not present

## 2015-07-22 DIAGNOSIS — S52532D Colles' fracture of left radius, subsequent encounter for closed fracture with routine healing: Secondary | ICD-10-CM | POA: Diagnosis not present

## 2015-07-22 DIAGNOSIS — S72112D Displaced fracture of greater trochanter of left femur, subsequent encounter for closed fracture with routine healing: Secondary | ICD-10-CM | POA: Diagnosis not present

## 2015-07-22 DIAGNOSIS — J449 Chronic obstructive pulmonary disease, unspecified: Secondary | ICD-10-CM | POA: Diagnosis not present

## 2015-07-22 DIAGNOSIS — J45909 Unspecified asthma, uncomplicated: Secondary | ICD-10-CM | POA: Diagnosis not present

## 2015-07-22 DIAGNOSIS — S32028D Other fracture of second lumbar vertebra, subsequent encounter for fracture with routine healing: Secondary | ICD-10-CM | POA: Diagnosis not present

## 2015-07-25 DIAGNOSIS — C44329 Squamous cell carcinoma of skin of other parts of face: Secondary | ICD-10-CM | POA: Diagnosis not present

## 2015-07-31 DIAGNOSIS — J449 Chronic obstructive pulmonary disease, unspecified: Secondary | ICD-10-CM | POA: Diagnosis not present

## 2015-08-21 DIAGNOSIS — J449 Chronic obstructive pulmonary disease, unspecified: Secondary | ICD-10-CM | POA: Diagnosis not present

## 2015-08-21 DIAGNOSIS — S32028D Other fracture of second lumbar vertebra, subsequent encounter for fracture with routine healing: Secondary | ICD-10-CM | POA: Diagnosis not present

## 2015-08-29 ENCOUNTER — Other Ambulatory Visit: Payer: Self-pay | Admitting: Physician Assistant

## 2015-08-30 DIAGNOSIS — J449 Chronic obstructive pulmonary disease, unspecified: Secondary | ICD-10-CM | POA: Diagnosis not present

## 2015-09-11 ENCOUNTER — Other Ambulatory Visit: Payer: Self-pay | Admitting: Internal Medicine

## 2015-09-21 DIAGNOSIS — S32028D Other fracture of second lumbar vertebra, subsequent encounter for fracture with routine healing: Secondary | ICD-10-CM | POA: Diagnosis not present

## 2015-09-21 DIAGNOSIS — J449 Chronic obstructive pulmonary disease, unspecified: Secondary | ICD-10-CM | POA: Diagnosis not present

## 2015-09-27 ENCOUNTER — Ambulatory Visit: Payer: Self-pay | Admitting: Internal Medicine

## 2015-09-30 DIAGNOSIS — J449 Chronic obstructive pulmonary disease, unspecified: Secondary | ICD-10-CM | POA: Diagnosis not present

## 2015-10-05 ENCOUNTER — Encounter: Payer: Self-pay | Admitting: Internal Medicine

## 2015-10-05 ENCOUNTER — Ambulatory Visit (INDEPENDENT_AMBULATORY_CARE_PROVIDER_SITE_OTHER): Payer: Medicare Other | Admitting: Internal Medicine

## 2015-10-05 VITALS — BP 134/80 | HR 47 | Temp 98.3°F | Resp 16 | Ht 64.0 in | Wt 168.0 lb

## 2015-10-05 DIAGNOSIS — R7303 Prediabetes: Secondary | ICD-10-CM

## 2015-10-05 DIAGNOSIS — Z7189 Other specified counseling: Secondary | ICD-10-CM

## 2015-10-05 DIAGNOSIS — I10 Essential (primary) hypertension: Secondary | ICD-10-CM | POA: Diagnosis not present

## 2015-10-05 DIAGNOSIS — F329 Major depressive disorder, single episode, unspecified: Secondary | ICD-10-CM

## 2015-10-05 DIAGNOSIS — E559 Vitamin D deficiency, unspecified: Secondary | ICD-10-CM

## 2015-10-05 DIAGNOSIS — Z6825 Body mass index (BMI) 25.0-25.9, adult: Secondary | ICD-10-CM

## 2015-10-05 DIAGNOSIS — S52502D Unspecified fracture of the lower end of left radius, subsequent encounter for closed fracture with routine healing: Secondary | ICD-10-CM

## 2015-10-05 DIAGNOSIS — S22000D Wedge compression fracture of unspecified thoracic vertebra, subsequent encounter for fracture with routine healing: Secondary | ICD-10-CM

## 2015-10-05 DIAGNOSIS — R6889 Other general symptoms and signs: Secondary | ICD-10-CM | POA: Diagnosis not present

## 2015-10-05 DIAGNOSIS — J449 Chronic obstructive pulmonary disease, unspecified: Secondary | ICD-10-CM

## 2015-10-05 DIAGNOSIS — J9622 Acute and chronic respiratory failure with hypercapnia: Secondary | ICD-10-CM | POA: Diagnosis not present

## 2015-10-05 DIAGNOSIS — C3490 Malignant neoplasm of unspecified part of unspecified bronchus or lung: Secondary | ICD-10-CM | POA: Diagnosis not present

## 2015-10-05 DIAGNOSIS — Z79899 Other long term (current) drug therapy: Secondary | ICD-10-CM | POA: Diagnosis not present

## 2015-10-05 DIAGNOSIS — F32A Depression, unspecified: Secondary | ICD-10-CM

## 2015-10-05 DIAGNOSIS — Z0001 Encounter for general adult medical examination with abnormal findings: Secondary | ICD-10-CM

## 2015-10-05 DIAGNOSIS — E785 Hyperlipidemia, unspecified: Secondary | ICD-10-CM

## 2015-10-05 DIAGNOSIS — S72002S Fracture of unspecified part of neck of left femur, sequela: Secondary | ICD-10-CM

## 2015-10-05 DIAGNOSIS — R7309 Other abnormal glucose: Secondary | ICD-10-CM | POA: Diagnosis not present

## 2015-10-05 DIAGNOSIS — Z0000 Encounter for general adult medical examination without abnormal findings: Secondary | ICD-10-CM

## 2015-10-05 DIAGNOSIS — E782 Mixed hyperlipidemia: Secondary | ICD-10-CM

## 2015-10-05 LAB — CBC WITH DIFFERENTIAL/PLATELET
BASOS ABS: 0 10*3/uL (ref 0.0–0.1)
Basophils Relative: 0 % (ref 0–1)
EOS ABS: 0.3 10*3/uL (ref 0.0–0.7)
EOS PCT: 3 % (ref 0–5)
HEMATOCRIT: 41.6 % (ref 36.0–46.0)
Hemoglobin: 13.2 g/dL (ref 12.0–15.0)
Lymphocytes Relative: 20 % (ref 12–46)
Lymphs Abs: 2.2 10*3/uL (ref 0.7–4.0)
MCH: 29.3 pg (ref 26.0–34.0)
MCHC: 31.7 g/dL (ref 30.0–36.0)
MCV: 92.4 fL (ref 78.0–100.0)
MONO ABS: 1.2 10*3/uL — AB (ref 0.1–1.0)
MPV: 10 fL (ref 8.6–12.4)
Monocytes Relative: 11 % (ref 3–12)
Neutro Abs: 7.2 10*3/uL (ref 1.7–7.7)
Neutrophils Relative %: 66 % (ref 43–77)
PLATELETS: 212 10*3/uL (ref 150–400)
RBC: 4.5 MIL/uL (ref 3.87–5.11)
RDW: 13.4 % (ref 11.5–15.5)
WBC: 10.9 10*3/uL — AB (ref 4.0–10.5)

## 2015-10-05 LAB — HEPATIC FUNCTION PANEL
ALBUMIN: 3.7 g/dL (ref 3.6–5.1)
ALK PHOS: 56 U/L (ref 33–130)
ALT: 13 U/L (ref 6–29)
AST: 19 U/L (ref 10–35)
BILIRUBIN TOTAL: 0.5 mg/dL (ref 0.2–1.2)
Bilirubin, Direct: 0.1 mg/dL (ref <=0.2)
Indirect Bilirubin: 0.4 mg/dL (ref 0.2–1.2)
TOTAL PROTEIN: 5.6 g/dL — AB (ref 6.1–8.1)

## 2015-10-05 LAB — BASIC METABOLIC PANEL WITH GFR
BUN: 9 mg/dL (ref 7–25)
CALCIUM: 9.4 mg/dL (ref 8.6–10.4)
CO2: 34 mmol/L — ABNORMAL HIGH (ref 20–31)
Chloride: 99 mmol/L (ref 98–110)
Creat: 0.66 mg/dL (ref 0.60–0.93)
GFR, EST NON AFRICAN AMERICAN: 85 mL/min (ref >=60)
GLUCOSE: 86 mg/dL (ref 65–99)
POTASSIUM: 4.3 mmol/L (ref 3.5–5.3)
Sodium: 141 mmol/L (ref 135–146)

## 2015-10-05 LAB — LIPID PANEL
Cholesterol: 164 mg/dL (ref 125–200)
HDL: 84 mg/dL (ref >=46)
LDL CALC: 68 mg/dL (ref <130)
TRIGLYCERIDES: 59 mg/dL (ref <150)
Total CHOL/HDL Ratio: 2 Ratio (ref <=5.0)
VLDL: 12 mg/dL (ref <30)

## 2015-10-05 LAB — TSH: TSH: 1.552 u[IU]/mL (ref 0.350–4.500)

## 2015-10-05 LAB — HEMOGLOBIN A1C
Hgb A1c MFr Bld: 6.6 % — ABNORMAL HIGH (ref <5.7)
Mean Plasma Glucose: 143 mg/dL — ABNORMAL HIGH (ref <117)

## 2015-10-05 MED ORDER — HYDROCODONE-ACETAMINOPHEN 5-325 MG PO TABS: 2.0000 | ORAL_TABLET | ORAL | Status: AC | PRN

## 2015-10-05 MED ORDER — AZITHROMYCIN 250 MG PO TABS: ORAL_TABLET | ORAL | Status: DC

## 2015-10-05 NOTE — Progress Notes (Signed)
Patient ID: Hailey Keith, female   DOB: Jan 17, 1935, 81 y.o.   MRN: [id number]  MEDICARE ANNUAL WELLNESS VISIT AND FOLLOW UP Assessment:   1. Prediabetes -cont diet and exercise - Hemoglobin A1c  2. Essential hypertension -avoid salt -DASH diet -monitor at home -well controlled currently -compression socks - TSH  3. Hyperlipidemia -diet and exercise - Lipid panel  4. Medication management - CBC with Differential/Platelet - BASIC METABOLIC PANEL WITH GFR - Hepatic function panel  5. COPD mixed type ([place]) -on chronic O2 -seems like a possible COPD exacerbation -cont scheduled nebs -zpak  6. Squamous cell carcinoma of bronchus, unspecified laterality (HCC) -appears to be resolved -follows with oncology  7. Acute on chronic respiratory failure with hypercapnia (HCC) -follows with oncology  8. Hip fracture, left, sequela -resolved  9. Distal radius fracture, left, closed, with routine healing, subsequent encounter -resolved  10. Thoracic compression fracture, with routine healing, subsequent encounter Mostly resolved -still occasional pain medication use -stepped down from oxycodone to hydrocodone  11. Mixed hyperlipidemia -cont diet and exercise  12. Depression, controlled -well controlled currently  13. Vitamin D deficiency -cont supplement  14. DNR (do not resuscitate) discussion -discussed today and she still wishes that this is the case  33. Medicare annual wellness visit, subsequent -due next year  28. BMI 25.87,  adult -cont diet and exercise    Over 30 minutes of exam, counseling, chart review, and critical decision making was performed  Plan:   During the course of the visit the patient was educated and counseled about appropriate screening and preventive services including:    Pneumococcal vaccine   Influenza vaccine  Prevnar 13  Td vaccine  Screening electrocardiogram  Colorectal cancer screening  Diabetes  screening  Glaucoma screening  Nutrition counseling   Conditions/risks identified: BMI: Discussed weight loss, diet, and increase physical activity.  Increase physical activity: AHA recommends 150 minutes of physical activity a week.  Medications reviewed Diabetes is at goal, ACE/ARB therapy: Yes. Urinary Incontinence is not an issue: discussed non pharmacology and pharmacology options.  Fall risk: high- discussed PT, home fall assessment, medications.    Subjective:  Hailey Keith is a 81 y.o. female who presents for Medicare Annual Wellness Visit and 3 month follow up for HTN, hyperlipidemia, prediabetes, and vitamin D Def.  Date of last medicare wellness visit was is 06/27/15.  Her blood pressure has been controlled at home, today their BP is   She does not workout. She denies chest pain, shortness of breath, dizziness.  She is not on cholesterol medication and denies myalgias. Her cholesterol is at goal. The cholesterol last visit was:   Lab Results  Component Value Date   CHOL 168 06/27/2015   HDL 81 06/27/2015   LDLCALC 72 06/27/2015   TRIG 74 06/27/2015   CHOLHDL 2.1 06/27/2015   She has been working on diet and exercise for prediabetes, and denies foot ulcerations, hyperglycemia, hypoglycemia , increased appetite, nausea, paresthesia of the feet, polydipsia, polyuria, visual disturbances, vomiting and weight loss. Last A1C in the office was:  Lab Results  Component Value Date   HGBA1C 6.4* 06/27/2015   Last GFR NonAA   Lab Results  Component Value Date   [facility] 86 06/27/2015   AA  Lab Results  Component Value Date   GFRAA >89 06/27/2015   Patient is on Vitamin D supplement.   Lab Results  Component Value Date   VD25OH 12 06/27/2015     She is thinking that  she will likely get cleared from Dr. Earlie Server.  She is due to have a CT of her chest done next week.    She is due to have more skin cancer removed from her face in both March and April.  She is  getting better from her compression fracture in her back.  She very rarely has to take pain medication.  [place] [facility] Database review was negative for abuse.    She has quit smoking since her last visit.    She is occasionally having some issues with leg swelling.  She does eat a lot of salt per her daughters report.    Medication Review: Current Outpatient Prescriptions on File Prior to Visit  Medication Sig Dispense Refill  . albuterol (PROVENTIL) (2.5 MG/3ML) 0.083% nebulizer solution Take 3 mLs (2.5 mg total) by nebulization every 4 (four) hours as needed for wheezing. 75 mL 1  . aspirin 81 MG tablet Take 81 mg by mouth daily.    Marland Kitchen atorvastatin (LIPITOR) 80 MG tablet TAKE 1 TABLET EVERY DAY FOR CHOLESTEROL 90 tablet 1  . citalopram (CELEXA) 40 MG tablet TAKE 1 TABLET BY MOUTH DAILY FOR MOOD 90 tablet 2  . doxazosin (CARDURA) 8 MG tablet TAKE 1 TABLET AT BEDTIME 90 tablet 2  . guaifenesin (HUMIBID E) 400 MG TABS tablet Take 400 mg by mouth 2 (two) times daily.    Marland Kitchen ipratropium-albuterol (DUONEB) 0.5-2.5 (3) MG/3ML SOLN TAKE 3 MLS BY NEBULIZATION 4 (FOUR) TIMES DAILY. 360 mL 99  . losartan (COZAAR) 100 MG tablet TAKE 1 TABLET BY MOUTH DAILY AS NEEDED FOR BLOOD PRESSURE 90 tablet 1  . Oxycodone HCl 10 MG TABS Take 10 mg by mouth every 4 (four) hours as needed (for pain).    Marland Kitchen oxyCODONE-acetaminophen (PERCOCET/ROXICET) 5-325 MG per tablet Take two tablets by mouth every four hours as needed for pain. Do not exceed 4gms of Tylenol in 24 hours 60 tablet 0  . predniSONE (DELTASONE) 5 MG tablet Take 5 mg by mouth 2 (two) times daily with a meal.    . PROAIR HFA 108 (90 BASE) MCG/ACT inhaler 1 TO 2 INHALATIONS EVERY 4 HOURS AS NEEDED FOR RESCUE ASTHMA 8.5 Inhaler 3  . Vitamin D, Ergocalciferol, (DRISDOL) 50000 UNITS CAPS capsule TAKE ONE CAPSULE EVERY DAY 30 capsule 2   No current facility-administered medications on file prior to visit.    Current Problems (verified) Patient Active Problem List    Diagnosis Date Noted  . Medicare annual wellness visit, subsequent 06/27/2015  . BMI 25.87,  adult 06/27/2015  . Thoracic compression fracture ([place])   . Palliative care encounter 05/04/2015  . DNR (do not resuscitate) discussion 05/04/2015  . Acute on chronic respiratory failure with hypercapnia ([place])   . Hip fracture, left ([place]) 12/26/2014  . Distal radius fracture, left 12/26/2014  . Prediabetes 09/30/2013  . Vitamin D deficiency 09/30/2013  . Medication management 09/30/2013  . Bronchogenic carcinoma squamous cell type ([place]) 05/14/2013  . Mixed hyperlipidemia 06/21/2007  . TOBACCO ABUSE 06/21/2007  . Depression, controlled 06/21/2007  . Essential hypertension 06/21/2007  . COPD mixed type ([place]) 06/21/2007    Screening Tests Immunization History  Administered Date(s) Administered  . Influenza, High Dose Seasonal PF 07/22/2014, 06/27/2015  . Influenza,inj,Quad PF,36+ Mos 05/14/2013  . Pneumococcal Conjugate-13 04/29/2014  . Pneumococcal Polysaccharide-23 07/21/2009  . Td 03/20/2012  . Zoster 09/04/2009    Preventative care: Last colonoscopy: 2011, ordered colonoscopy  Prior vaccinations: TD or Tdap: 2013  Influenza: 2016  Pneumococcal: 2010 Prevnar13: 2015 Shingles/Zostavax: 2011  Names of Other Physician/Practitioners you currently use: 1. [facility] here for primary care 2. Dr. Delman Cheadle, eye doctor, last visit 2016 3. Doesn't see one and is due to get a new pair of dentures, dentist, last visit  Patient Care Team: Unk Pinto, MD as PCP - General (Internal Medicine) Suella Broad, [name], [name], [name], [name], [name], MD as Consulting Physician  (Pulmonary Disease) Eppie Gibson, MD as Attending Physician (Radiation Oncology)  Past Surgical History  Procedure Laterality Date  . Tonsillectomy      as child, repeat surgery early 20's  . Mohs surgery      "several times"  . Facial reconstruction surgery  25 years ago    to remove skin cancer, right side of face   Family History  Problem Relation Age of Onset  . Hypertension    . Lung cancer Sister     tx w/radiation, met to stomach  . Pneumonia Mother     Deceased  . Heart attack Father     Deceased  . Cancer Sister     "back of neck"   Social History  Substance Use Topics  . Smoking status: Former Smoker -- 0.50 packs/day for 60 years    Types: Cigarettes    Quit date: 05/12/2015  . Smokeless tobacco: None     Comment: 06/05/13 currently 1/2 PPD, trying to quit  . Alcohol Use: Yes     Comment: occ wine, 2 drinks nightly    MEDICARE WELLNESS OBJECTIVES: Tobacco use: She does not smoke.  Patient is a former smoker. If yes, counseling given Alcohol Current alcohol use: none Osteoporosis: postmenopausal estrogen deficiency, History of fracture in the past year: yes Fall risk: High Risk Hearing: impaired Visual acuity: impaired,  does perform annual eye exam Diet: well balanced Physical activity:   Cardiac risk factors:   Depression/mood screen:   Depression screen PHQ 2/9 06/27/2015  Decreased Interest 0  Down, Depressed, Hopeless 0  PHQ - 2 Score 0    ADLs:  In your present state of health, do you have any difficulty performing the following activities: 06/27/2015 05/10/2015  Hearing? Y N  Vision? N N  Difficulty concentrating or making decisions? N Y  Walking or climbing stairs? N N  Dressing or bathing? N N  Doing errands, shopping? N -     Cognitive Testing  Alert? Yes  Normal Appearance?Yes  Oriented to person? Yes  Place? Yes   Time? Yes  Recall of three objects?  Yes  Can perform simple calculations? Yes  Displays appropriate  judgment?Yes  Can read the correct time from a watch face?Yes  EOL planning:     Objective:   Today's Vitals   10/05/15 1053  Height: '5\' 4"'$  (1.626 m)   There is no weight on file to calculate BMI.  General appearance: alert, no distress, WD/WN, female, On nasal canula O2 HEENT: normocephalic, sclerae anicteric, TMs pearly, nares patent, no discharge or erythema, pharynx normal Oral cavity: MMM, no lesions Neck: supple, no lymphadenopathy, no thyromegaly, no masses Heart: RRR, normal S1, S2, no murmurs Lungs: Lung with wheezes at the bases bilaterally with some intermittent rhonchi, or rales.  Patient did cough up yellow mucous during exam. Abdomen: +bs,  soft, non tender, non distended, no masses, no hepatomegaly, no splenomegaly Musculoskeletal: nontender, no swelling, no obvious deformity Extremities: no edema, no cyanosis, no clubbing Pulses: 2+ symmetric, upper and lower extremities, normal cap refill Neurological: alert, oriented x 3, CN2-12 intact, strength normal upper extremities and lower extremities, sensation normal throughout, DTRs 2+ throughout, no cerebellar signs, gait normal Psychiatric: normal affect, behavior normal, pleasant  Skin:1 cm round pearly papular lesion to the left cheek with some scabbing and telangectasia  Medicare Attestation I have personally reviewed: The patient's medical and social history Their use of alcohol, tobacco or illicit drugs Their current medications and supplements The patient's functional ability including ADLs,fall risks, home safety risks, cognitive, and hearing and visual impairment Diet and physical activities Evidence for depression or mood disorders  The patient's weight, height, BMI, and visual acuity have been recorded in the chart.  I have made referrals, counseling, and provided education to the patient based on review of the above and I have provided the patient with a written personalized care plan for preventive  services.     Starlyn Skeans, PA-C   10/05/2015

## 2015-10-08 ENCOUNTER — Other Ambulatory Visit: Payer: Self-pay | Admitting: Physician Assistant

## 2015-10-14 ENCOUNTER — Other Ambulatory Visit (HOSPITAL_BASED_OUTPATIENT_CLINIC_OR_DEPARTMENT_OTHER): Payer: Medicare Other

## 2015-10-14 ENCOUNTER — Encounter (HOSPITAL_COMMUNITY): Payer: Self-pay

## 2015-10-14 ENCOUNTER — Ambulatory Visit (HOSPITAL_COMMUNITY)
Admission: RE | Admit: 2015-10-14 | Discharge: 2015-10-14 | Disposition: A | Discharge status: Home / Self Care | Payer: Medicare Other | Source: Ambulatory Visit | Attending: Internal Medicine | Admitting: Internal Medicine

## 2015-10-14 DIAGNOSIS — J441 Chronic obstructive pulmonary disease with (acute) exacerbation: Secondary | ICD-10-CM | POA: Diagnosis not present

## 2015-10-14 DIAGNOSIS — I5033 Acute on chronic diastolic (congestive) heart failure: Secondary | ICD-10-CM | POA: Diagnosis not present

## 2015-10-14 DIAGNOSIS — J189 Pneumonia, unspecified organism: Secondary | ICD-10-CM | POA: Diagnosis not present

## 2015-10-14 DIAGNOSIS — R0600 Dyspnea, unspecified: Secondary | ICD-10-CM | POA: Diagnosis not present

## 2015-10-14 DIAGNOSIS — E876 Hypokalemia: Secondary | ICD-10-CM | POA: Diagnosis not present

## 2015-10-14 DIAGNOSIS — I509 Heart failure, unspecified: Secondary | ICD-10-CM | POA: Diagnosis not present

## 2015-10-14 DIAGNOSIS — I2510 Atherosclerotic heart disease of native coronary artery without angina pectoris: Secondary | ICD-10-CM

## 2015-10-14 DIAGNOSIS — Z923 Personal history of irradiation: Secondary | ICD-10-CM | POA: Insufficient documentation

## 2015-10-14 DIAGNOSIS — Z85118 Personal history of other malignant neoplasm of bronchus and lung: Secondary | ICD-10-CM | POA: Diagnosis not present

## 2015-10-14 DIAGNOSIS — C3490 Malignant neoplasm of unspecified part of unspecified bronchus or lung: Secondary | ICD-10-CM

## 2015-10-14 DIAGNOSIS — Z7952 Long term (current) use of systemic steroids: Secondary | ICD-10-CM | POA: Diagnosis not present

## 2015-10-14 DIAGNOSIS — Z85828 Personal history of other malignant neoplasm of skin: Secondary | ICD-10-CM | POA: Diagnosis not present

## 2015-10-14 DIAGNOSIS — J449 Chronic obstructive pulmonary disease, unspecified: Secondary | ICD-10-CM | POA: Diagnosis not present

## 2015-10-14 DIAGNOSIS — I10 Essential (primary) hypertension: Secondary | ICD-10-CM | POA: Diagnosis not present

## 2015-10-14 DIAGNOSIS — I110 Hypertensive heart disease with heart failure: Secondary | ICD-10-CM | POA: Diagnosis not present

## 2015-10-14 DIAGNOSIS — R069 Unspecified abnormalities of breathing: Secondary | ICD-10-CM | POA: Diagnosis not present

## 2015-10-14 DIAGNOSIS — J9622 Acute and chronic respiratory failure with hypercapnia: Secondary | ICD-10-CM | POA: Diagnosis not present

## 2015-10-14 DIAGNOSIS — J9621 Acute and chronic respiratory failure with hypoxia: Secondary | ICD-10-CM | POA: Diagnosis not present

## 2015-10-14 DIAGNOSIS — D899 Disorder involving the immune mechanism, unspecified: Secondary | ICD-10-CM | POA: Diagnosis not present

## 2015-10-14 DIAGNOSIS — R7303 Prediabetes: Secondary | ICD-10-CM | POA: Diagnosis not present

## 2015-10-14 DIAGNOSIS — G8929 Other chronic pain: Secondary | ICD-10-CM | POA: Diagnosis not present

## 2015-10-14 DIAGNOSIS — R0602 Shortness of breath: Secondary | ICD-10-CM | POA: Diagnosis not present

## 2015-10-14 DIAGNOSIS — K5900 Constipation, unspecified: Secondary | ICD-10-CM | POA: Diagnosis not present

## 2015-10-14 DIAGNOSIS — J440 Chronic obstructive pulmonary disease with acute lower respiratory infection: Secondary | ICD-10-CM | POA: Diagnosis not present

## 2015-10-14 DIAGNOSIS — Z66 Do not resuscitate: Secondary | ICD-10-CM | POA: Diagnosis not present

## 2015-10-14 DIAGNOSIS — H9190 Unspecified hearing loss, unspecified ear: Secondary | ICD-10-CM | POA: Diagnosis not present

## 2015-10-14 DIAGNOSIS — C3412 Malignant neoplasm of upper lobe, left bronchus or lung: Secondary | ICD-10-CM | POA: Diagnosis not present

## 2015-10-14 DIAGNOSIS — R0902 Hypoxemia: Secondary | ICD-10-CM | POA: Diagnosis not present

## 2015-10-14 DIAGNOSIS — Z7982 Long term (current) use of aspirin: Secondary | ICD-10-CM | POA: Diagnosis not present

## 2015-10-14 DIAGNOSIS — M545 Low back pain: Secondary | ICD-10-CM | POA: Diagnosis not present

## 2015-10-14 DIAGNOSIS — E782 Mixed hyperlipidemia: Secondary | ICD-10-CM | POA: Diagnosis not present

## 2015-10-14 DIAGNOSIS — J9601 Acute respiratory failure with hypoxia: Secondary | ICD-10-CM | POA: Diagnosis not present

## 2015-10-14 DIAGNOSIS — Z87891 Personal history of nicotine dependence: Secondary | ICD-10-CM | POA: Diagnosis not present

## 2015-10-14 LAB — COMPREHENSIVE METABOLIC PANEL
ALT: 16 U/L (ref 0–55)
ANION GAP: 9 meq/L (ref 3–11)
AST: 16 U/L (ref 5–34)
Albumin: 3.3 g/dL — ABNORMAL LOW (ref 3.5–5.0)
Alkaline Phosphatase: 79 U/L (ref 40–150)
BILIRUBIN TOTAL: 0.51 mg/dL (ref 0.20–1.20)
BUN: 12.8 mg/dL (ref 7.0–26.0)
CHLORIDE: 99 meq/L (ref 98–109)
CO2: 32 meq/L — AB (ref 22–29)
Calcium: 9.3 mg/dL (ref 8.4–10.4)
Creatinine: 0.8 mg/dL (ref 0.6–1.1)
EGFR: 73 mL/min/{1.73_m2} — AB (ref >90)
GLUCOSE: 174 mg/dL — AB (ref 70–140)
POTASSIUM: 4.4 meq/L (ref 3.5–5.1)
SODIUM: 140 meq/L (ref 136–145)
Total Protein: 5.7 g/dL — ABNORMAL LOW (ref 6.4–8.3)

## 2015-10-14 LAB — CBC WITH DIFFERENTIAL/PLATELET
BASO%: 0.4 % (ref 0.0–2.0)
Basophils Absolute: 0 10*3/uL (ref 0.0–0.1)
EOS%: 2.1 % (ref 0.0–7.0)
Eosinophils Absolute: 0.2 10*3/uL (ref 0.0–0.5)
HCT: 40 % (ref 34.8–46.6)
HGB: 12.7 g/dL (ref 11.6–15.9)
LYMPH#: 0.8 10*3/uL — AB (ref 0.9–3.3)
LYMPH%: 7.4 % — AB (ref 14.0–49.7)
MCH: 29.2 pg (ref 25.1–34.0)
MCHC: 31.7 g/dL (ref 31.5–36.0)
MCV: 92 fL (ref 79.5–101.0)
MONO#: 1.1 10*3/uL — ABNORMAL HIGH (ref 0.1–0.9)
MONO%: 9.9 % (ref 0.0–14.0)
NEUT%: 80.2 % — AB (ref 38.4–76.8)
NEUTROS ABS: 8.8 10*3/uL — AB (ref 1.5–6.5)
PLATELETS: 174 10*3/uL (ref 145–400)
RBC: 4.35 10*6/uL (ref 3.70–5.45)
RDW: 13.7 % (ref 11.2–14.5)
WBC: 11 10*3/uL — AB (ref 3.9–10.3)

## 2015-10-14 MED ORDER — IOHEXOL 300 MG/ML  SOLN
75.0000 mL | Freq: Once | INTRAMUSCULAR | Status: AC | PRN
  Administered 2015-10-14: 75 mL via INTRAVENOUS

## 2015-10-16 ENCOUNTER — Inpatient Hospital Stay (HOSPITAL_COMMUNITY)
Admission: EM | Admit: 2015-10-16 | Discharge: 2015-10-21 | DRG: 190 | Disposition: A | Discharge status: Home Health | Payer: Medicare Other | Attending: Internal Medicine | Admitting: Internal Medicine

## 2015-10-16 ENCOUNTER — Emergency Department (HOSPITAL_COMMUNITY): Payer: Medicare Other

## 2015-10-16 ENCOUNTER — Encounter (HOSPITAL_COMMUNITY): Payer: Self-pay

## 2015-10-16 ENCOUNTER — Other Ambulatory Visit: Payer: Self-pay | Admitting: Physician Assistant

## 2015-10-16 DIAGNOSIS — G8929 Other chronic pain: Secondary | ICD-10-CM | POA: Diagnosis present

## 2015-10-16 DIAGNOSIS — E782 Mixed hyperlipidemia: Secondary | ICD-10-CM | POA: Diagnosis present

## 2015-10-16 DIAGNOSIS — Z66 Do not resuscitate: Secondary | ICD-10-CM | POA: Diagnosis present

## 2015-10-16 DIAGNOSIS — I110 Hypertensive heart disease with heart failure: Secondary | ICD-10-CM | POA: Diagnosis present

## 2015-10-16 DIAGNOSIS — J9622 Acute and chronic respiratory failure with hypercapnia: Secondary | ICD-10-CM | POA: Diagnosis not present

## 2015-10-16 DIAGNOSIS — E876 Hypokalemia: Secondary | ICD-10-CM | POA: Diagnosis not present

## 2015-10-16 DIAGNOSIS — I509 Heart failure, unspecified: Secondary | ICD-10-CM

## 2015-10-16 DIAGNOSIS — J9601 Acute respiratory failure with hypoxia: Secondary | ICD-10-CM

## 2015-10-16 DIAGNOSIS — J449 Chronic obstructive pulmonary disease, unspecified: Secondary | ICD-10-CM

## 2015-10-16 DIAGNOSIS — S22000A Wedge compression fracture of unspecified thoracic vertebra, initial encounter for closed fracture: Secondary | ICD-10-CM | POA: Clinically undetermined

## 2015-10-16 DIAGNOSIS — J189 Pneumonia, unspecified organism: Secondary | ICD-10-CM | POA: Diagnosis present

## 2015-10-16 DIAGNOSIS — J441 Chronic obstructive pulmonary disease with (acute) exacerbation: Principal | ICD-10-CM | POA: Diagnosis present

## 2015-10-16 DIAGNOSIS — Z7952 Long term (current) use of systemic steroids: Secondary | ICD-10-CM

## 2015-10-16 DIAGNOSIS — R0902 Hypoxemia: Secondary | ICD-10-CM

## 2015-10-16 DIAGNOSIS — J9621 Acute and chronic respiratory failure with hypoxia: Secondary | ICD-10-CM | POA: Diagnosis present

## 2015-10-16 DIAGNOSIS — M545 Low back pain: Secondary | ICD-10-CM | POA: Diagnosis present

## 2015-10-16 DIAGNOSIS — I5033 Acute on chronic diastolic (congestive) heart failure: Secondary | ICD-10-CM | POA: Diagnosis present

## 2015-10-16 DIAGNOSIS — Z7189 Other specified counseling: Secondary | ICD-10-CM

## 2015-10-16 DIAGNOSIS — Z85118 Personal history of other malignant neoplasm of bronchus and lung: Secondary | ICD-10-CM

## 2015-10-16 DIAGNOSIS — J440 Chronic obstructive pulmonary disease with acute lower respiratory infection: Secondary | ICD-10-CM | POA: Diagnosis present

## 2015-10-16 DIAGNOSIS — D899 Disorder involving the immune mechanism, unspecified: Secondary | ICD-10-CM | POA: Diagnosis present

## 2015-10-16 DIAGNOSIS — K5900 Constipation, unspecified: Secondary | ICD-10-CM | POA: Diagnosis not present

## 2015-10-16 DIAGNOSIS — Z85828 Personal history of other malignant neoplasm of skin: Secondary | ICD-10-CM

## 2015-10-16 DIAGNOSIS — R7303 Prediabetes: Secondary | ICD-10-CM | POA: Diagnosis present

## 2015-10-16 DIAGNOSIS — I10 Essential (primary) hypertension: Secondary | ICD-10-CM

## 2015-10-16 DIAGNOSIS — Z87891 Personal history of nicotine dependence: Secondary | ICD-10-CM

## 2015-10-16 DIAGNOSIS — Z7982 Long term (current) use of aspirin: Secondary | ICD-10-CM

## 2015-10-16 DIAGNOSIS — Z923 Personal history of irradiation: Secondary | ICD-10-CM

## 2015-10-16 DIAGNOSIS — H9190 Unspecified hearing loss, unspecified ear: Secondary | ICD-10-CM | POA: Diagnosis present

## 2015-10-16 LAB — COMPREHENSIVE METABOLIC PANEL
ALK PHOS: 54 U/L (ref 38–126)
ALT: 16 U/L (ref 14–54)
AST: 26 U/L (ref 15–41)
Albumin: 3.2 g/dL — ABNORMAL LOW (ref 3.5–5.0)
Anion gap: 11 (ref 5–15)
BILIRUBIN TOTAL: 0.9 mg/dL (ref 0.3–1.2)
BUN: 21 mg/dL — AB (ref 6–20)
CALCIUM: 9.2 mg/dL (ref 8.9–10.3)
CHLORIDE: 96 mmol/L — AB (ref 101–111)
CO2: 31 mmol/L (ref 22–32)
CREATININE: 0.9 mg/dL (ref 0.44–1.00)
GFR, EST NON AFRICAN AMERICAN: 60 mL/min — AB (ref >60)
Glucose, Bld: 142 mg/dL — ABNORMAL HIGH (ref 65–99)
Potassium: 4.8 mmol/L (ref 3.5–5.1)
Sodium: 138 mmol/L (ref 135–145)
Total Protein: 5.1 g/dL — ABNORMAL LOW (ref 6.5–8.1)

## 2015-10-16 LAB — I-STAT ARTERIAL BLOOD GAS, ED
Acid-Base Excess: 5 mmol/L — ABNORMAL HIGH (ref 0.0–2.0)
Bicarbonate: 33.6 mEq/L — ABNORMAL HIGH (ref 20.0–24.0)
O2 SAT: 87 %
PCO2 ART: 69 mmHg — AB (ref 35.0–45.0)
PH ART: 7.293 — AB (ref 7.350–7.450)
PO2 ART: 61 mmHg — AB (ref 80.0–100.0)
Patient temperature: 97.8
TCO2: 36 mmol/L (ref 0–100)

## 2015-10-16 LAB — CBC WITH DIFFERENTIAL/PLATELET
BASOS ABS: 0 10*3/uL (ref 0.0–0.1)
BASOS PCT: 0 %
Eosinophils Absolute: 0.2 10*3/uL (ref 0.0–0.7)
Eosinophils Relative: 2 %
HEMATOCRIT: 38.1 % (ref 36.0–46.0)
HEMOGLOBIN: 11.8 g/dL — AB (ref 12.0–15.0)
Lymphocytes Relative: 6 %
Lymphs Abs: 0.8 10*3/uL (ref 0.7–4.0)
MCH: 30.2 pg (ref 26.0–34.0)
MCHC: 31 g/dL (ref 30.0–36.0)
MCV: 97.4 fL (ref 78.0–100.0)
Monocytes Absolute: 1.8 10*3/uL — ABNORMAL HIGH (ref 0.1–1.0)
Monocytes Relative: 15 %
NEUTROS ABS: 9.3 10*3/uL — AB (ref 1.7–7.7)
NEUTROS PCT: 77 %
PLATELETS: 160 10*3/uL (ref 150–400)
RBC: 3.91 MIL/uL (ref 3.87–5.11)
RDW: 13.9 % (ref 11.5–15.5)
WBC: 12 10*3/uL — AB (ref 4.0–10.5)

## 2015-10-16 LAB — BRAIN NATRIURETIC PEPTIDE: B Natriuretic Peptide: 340.6 pg/mL — ABNORMAL HIGH (ref 0.0–100.0)

## 2015-10-16 LAB — URINALYSIS, ROUTINE W REFLEX MICROSCOPIC
BILIRUBIN URINE: NEGATIVE
Glucose, UA: NEGATIVE mg/dL
HGB URINE DIPSTICK: NEGATIVE
Ketones, ur: NEGATIVE mg/dL
Leukocytes, UA: NEGATIVE
Nitrite: NEGATIVE
PH: 5 (ref 5.0–8.0)
Protein, ur: NEGATIVE mg/dL
SPECIFIC GRAVITY, URINE: 1.028 (ref 1.005–1.030)

## 2015-10-16 LAB — I-STAT TROPONIN, ED: TROPONIN I, POC: 0.1 ng/mL — AB (ref 0.00–0.08)

## 2015-10-16 LAB — TROPONIN I: Troponin I: 0.11 ng/mL — ABNORMAL HIGH (ref <0.031)

## 2015-10-16 MED ORDER — FUROSEMIDE 10 MG/ML IJ SOLN: 40.0000 mg | INTRAMUSCULAR | Status: DC

## 2015-10-16 MED ORDER — LEVOFLOXACIN IN D5W 750 MG/150ML IV SOLN: 750.0000 mg | INTRAVENOUS | Status: DC

## 2015-10-16 MED ORDER — SODIUM CHLORIDE 0.9% FLUSH: 3.0000 mL | INTRAVENOUS | Status: DC | PRN

## 2015-10-16 MED ORDER — ACETAMINOPHEN 325 MG PO TABS: 650.0000 mg | ORAL_TABLET | Freq: Four times a day (QID) | ORAL | Status: DC | PRN

## 2015-10-16 MED ORDER — DOXAZOSIN MESYLATE 8 MG PO TABS
8.0000 mg | ORAL_TABLET | Freq: Every day | ORAL | Status: DC
  Administered 2015-10-16 – 2015-10-20 (×5): 8 mg via ORAL
  Filled 2015-10-16 (×6): qty 1

## 2015-10-16 MED ORDER — ARFORMOTEROL TARTRATE 15 MCG/2ML IN NEBU
15.0000 ug | INHALATION_SOLUTION | Freq: Two times a day (BID) | RESPIRATORY_TRACT | Status: DC
  Administered 2015-10-16 – 2015-10-21 (×9): 15 ug via RESPIRATORY_TRACT
  Filled 2015-10-16 (×9): qty 2

## 2015-10-16 MED ORDER — SODIUM CHLORIDE 0.9% FLUSH
3.0000 mL | Freq: Two times a day (BID) | INTRAVENOUS | Status: DC
  Administered 2015-10-16 – 2015-10-21 (×10): 3 mL via INTRAVENOUS

## 2015-10-16 MED ORDER — FUROSEMIDE 10 MG/ML IJ SOLN
40.0000 mg | Freq: Two times a day (BID) | INTRAMUSCULAR | Status: AC
  Administered 2015-10-17 – 2015-10-19 (×6): 40 mg via INTRAVENOUS
  Filled 2015-10-16 (×6): qty 4

## 2015-10-16 MED ORDER — SENNOSIDES-DOCUSATE SODIUM 8.6-50 MG PO TABS: 1.0000 | ORAL_TABLET | Freq: Every evening | ORAL | Status: DC | PRN

## 2015-10-16 MED ORDER — CHLORHEXIDINE GLUCONATE 0.12 % MT SOLN
15.0000 mL | Freq: Two times a day (BID) | OROMUCOSAL | Status: DC
  Administered 2015-10-17 – 2015-10-21 (×8): 15 mL via OROMUCOSAL
  Filled 2015-10-16 (×5): qty 15

## 2015-10-16 MED ORDER — PREDNISONE 5 MG PO TABS
5.0000 mg | ORAL_TABLET | Freq: Two times a day (BID) | ORAL | Status: DC
Start: 2015-10-16 — End: 2015-10-17
  Administered 2015-10-16: 5 mg via ORAL
  Filled 2015-10-16 (×4): qty 1

## 2015-10-16 MED ORDER — CETYLPYRIDINIUM CHLORIDE 0.05 % MT LIQD
7.0000 mL | Freq: Two times a day (BID) | OROMUCOSAL | Status: DC
  Administered 2015-10-17 (×2): 7 mL via OROMUCOSAL

## 2015-10-16 MED ORDER — GUAIFENESIN 400 MG PO TABS: 400.0000 mg | ORAL_TABLET | Freq: Two times a day (BID) | ORAL | Status: DC

## 2015-10-16 MED ORDER — HYDROMORPHONE HCL 1 MG/ML IJ SOLN
0.5000 mg | INTRAMUSCULAR | Status: DC | PRN
  Administered 2015-10-16 – 2015-10-21 (×4): 0.5 mg via INTRAVENOUS
  Filled 2015-10-16 (×4): qty 1

## 2015-10-16 MED ORDER — HYDROCODONE-ACETAMINOPHEN 5-325 MG PO TABS
2.0000 | ORAL_TABLET | ORAL | Status: DC | PRN
  Administered 2015-10-16 – 2015-10-21 (×14): 2 via ORAL
  Filled 2015-10-16 (×14): qty 2

## 2015-10-16 MED ORDER — ONDANSETRON HCL 4 MG PO TABS: 4.0000 mg | ORAL_TABLET | Freq: Four times a day (QID) | ORAL | Status: DC | PRN

## 2015-10-16 MED ORDER — BISACODYL 5 MG PO TBEC: 5.0000 mg | DELAYED_RELEASE_TABLET | Freq: Every day | ORAL | Status: DC | PRN

## 2015-10-16 MED ORDER — FENTANYL CITRATE (PF) 100 MCG/2ML IJ SOLN
50.0000 ug | Freq: Once | INTRAMUSCULAR | Status: AC
  Administered 2015-10-16: 50 ug via INTRAVENOUS
  Filled 2015-10-16: qty 2

## 2015-10-16 MED ORDER — IPRATROPIUM-ALBUTEROL 0.5-2.5 (3) MG/3ML IN SOLN: 3.0000 mL | Freq: Four times a day (QID) | RESPIRATORY_TRACT | Status: DC | PRN

## 2015-10-16 MED ORDER — ONDANSETRON HCL 4 MG/2ML IJ SOLN: 4.0000 mg | Freq: Four times a day (QID) | INTRAMUSCULAR | Status: DC | PRN

## 2015-10-16 MED ORDER — LEVOFLOXACIN IN D5W 750 MG/150ML IV SOLN
750.0000 mg | INTRAVENOUS | Status: DC
  Administered 2015-10-17: 750 mg via INTRAVENOUS
  Filled 2015-10-16 (×2): qty 150

## 2015-10-16 MED ORDER — ACETAMINOPHEN 650 MG RE SUPP: 650.0000 mg | Freq: Four times a day (QID) | RECTAL | Status: DC | PRN

## 2015-10-16 MED ORDER — BUDESONIDE 0.5 MG/2ML IN SUSP
0.5000 mg | Freq: Two times a day (BID) | RESPIRATORY_TRACT | Status: DC
  Administered 2015-10-16 – 2015-10-21 (×9): 0.5 mg via RESPIRATORY_TRACT
  Filled 2015-10-16 (×9): qty 2

## 2015-10-16 MED ORDER — ALBUTEROL (5 MG/ML) CONTINUOUS INHALATION SOLN
10.0000 mg/h | INHALATION_SOLUTION | Freq: Once | RESPIRATORY_TRACT | Status: AC
  Administered 2015-10-16: 10 mg/h via RESPIRATORY_TRACT
  Filled 2015-10-16: qty 20

## 2015-10-16 MED ORDER — ASPIRIN EC 81 MG PO TBEC
81.0000 mg | DELAYED_RELEASE_TABLET | Freq: Every day | ORAL | Status: DC
  Administered 2015-10-16 – 2015-10-21 (×5): 81 mg via ORAL
  Filled 2015-10-16 (×5): qty 1

## 2015-10-16 MED ORDER — ASPIRIN 81 MG PO TABS: 81.0000 mg | ORAL_TABLET | Freq: Every day | ORAL | Status: DC

## 2015-10-16 MED ORDER — CITALOPRAM HYDROBROMIDE 40 MG PO TABS
40.0000 mg | ORAL_TABLET | Freq: Every day | ORAL | Status: DC
  Administered 2015-10-18 – 2015-10-21 (×4): 40 mg via ORAL
  Filled 2015-10-16 (×2): qty 2
  Filled 2015-10-16 (×2): qty 1

## 2015-10-16 MED ORDER — FUROSEMIDE 10 MG/ML IJ SOLN
40.0000 mg | INTRAMUSCULAR | Status: AC
  Administered 2015-10-16: 40 mg via INTRAVENOUS
  Filled 2015-10-16: qty 4

## 2015-10-16 MED ORDER — VANCOMYCIN HCL 10 G IV SOLR
1500.0000 mg | Freq: Once | INTRAVENOUS | Status: AC
  Administered 2015-10-16: 1500 mg via INTRAVENOUS
  Filled 2015-10-16: qty 1500

## 2015-10-16 MED ORDER — LOSARTAN POTASSIUM 50 MG PO TABS
100.0000 mg | ORAL_TABLET | Freq: Every day | ORAL | Status: DC
  Administered 2015-10-18 – 2015-10-21 (×4): 100 mg via ORAL
  Filled 2015-10-16 (×4): qty 2

## 2015-10-16 MED ORDER — ENOXAPARIN SODIUM 40 MG/0.4ML ~~LOC~~ SOLN
40.0000 mg | SUBCUTANEOUS | Status: DC
  Administered 2015-10-16 – 2015-10-20 (×5): 40 mg via SUBCUTANEOUS
  Filled 2015-10-16 (×6): qty 0.4

## 2015-10-16 MED ORDER — ATORVASTATIN CALCIUM 80 MG PO TABS
80.0000 mg | ORAL_TABLET | Freq: Every day | ORAL | Status: DC
  Administered 2015-10-17 – 2015-10-20 (×4): 80 mg via ORAL
  Filled 2015-10-16 (×4): qty 1

## 2015-10-16 MED ORDER — METHYLPREDNISOLONE SODIUM SUCC 125 MG IJ SOLR
125.0000 mg | Freq: Once | INTRAMUSCULAR | Status: AC
  Administered 2015-10-16: 125 mg via INTRAVENOUS
  Filled 2015-10-16: qty 2

## 2015-10-16 MED ORDER — SODIUM CHLORIDE 0.9 % IV BOLUS (SEPSIS)
500.0000 mL | Freq: Once | INTRAVENOUS | Status: AC
  Administered 2015-10-16: 500 mL via INTRAVENOUS

## 2015-10-16 MED ORDER — IOHEXOL 350 MG/ML SOLN
100.0000 mL | Freq: Once | INTRAVENOUS | Status: AC | PRN
  Administered 2015-10-16: 60 mL via INTRAVENOUS

## 2015-10-16 MED ORDER — GUAIFENESIN ER 600 MG PO TB12
600.0000 mg | ORAL_TABLET | Freq: Two times a day (BID) | ORAL | Status: DC
  Administered 2015-10-16 – 2015-10-21 (×9): 600 mg via ORAL
  Filled 2015-10-16 (×9): qty 1

## 2015-10-16 MED ORDER — ALBUTEROL SULFATE (2.5 MG/3ML) 0.083% IN NEBU
2.5000 mg | INHALATION_SOLUTION | RESPIRATORY_TRACT | Status: DC | PRN
  Administered 2015-10-20: 2.5 mg via RESPIRATORY_TRACT
  Filled 2015-10-16: qty 3

## 2015-10-16 MED ORDER — PIPERACILLIN-TAZOBACTAM 3.375 G IVPB 30 MIN
3.3750 g | Freq: Once | INTRAVENOUS | Status: AC
  Administered 2015-10-16: 3.375 g via INTRAVENOUS
  Filled 2015-10-16: qty 50

## 2015-10-16 MED ORDER — IPRATROPIUM BROMIDE 0.02 % IN SOLN
1.5000 mg | Freq: Once | RESPIRATORY_TRACT | Status: AC
  Administered 2015-10-16: 1.5 mg via RESPIRATORY_TRACT
  Filled 2015-10-16: qty 7.5

## 2015-10-16 NOTE — H&P (Addendum)
[facility] History and Physical  [name] YWV:[id number] DOB: September 09, 1934 DOA: 10/16/2015  Attending MD note  Patient was seen, examined,treatment plan was discussed with the  Advance Practice Provider.  I have personally reviewed the clinical findings, lab,EKG, imaging studies and management of this patient in detail.I have also reviewed the orders written for this patient which were under my direction. I agree with the documentation, as recorded by the Advance Practice Provider.   Hailey Keith is a 81 y.o. female with a Past Medical History of COPD, stage II non-small cell lung cancer s/p radiation; who presented with complaints of shortness of breath. Initial ABG showing signs of acute hypoxemic and hypercapnic respiratory failure. Patient started on a BiPAP mask with improvement of mental status. Also found to have elevated BNP of 327 with no previous BNP or echocardiograms to further investigate. Will start on IV Lasix 40 mg every 12 hours and strict ins and outs. Treated with nebulizers and empiric antibiotics due to patient's immunocompromised status and the possibility of underlying infection such as CAP. Wean as tolerated to nasal cannula oxygen.   Norval Morton, MD Internal Medicine  pager [phone] [facility]       Referring physician: ER MD PCP: Alesia Richards, MD   Chief Complaint: Shortness of Breath  HPI: Hailey Keith is a 81 y.o. femaleWith a history of stage IIB non-small cell lung carcinoma, Squamous cell, status post radiation to the left upper lower lobe lung mass, currently on observation, and COPD, presenting to the emergency department, with 2 day history of worsening shortness of breath with cough. She was brought by EMS. Per report, she was found to be hypoxemic, with saturations dropping to 60% in room air, requiring NRB without improvement, then placed on BiPAP with some relief, In improvement of her O2 sats, and vital  signs. She had on 10/05/15 as OP, at which time she had possible COPD exacerbation and  placed on Z-Pak.  She denies any productive cough, hemoptysis, or pleuritic chest pain. She denies any cardiac issues such as palpitations, chest pain, or dizziness.CNS any headache, nausea or vomiting. She denies any urinary symptoms. She denies any lower extremity edema. No confusion was reported. CT angiogram of the chest, essentially unchanged from prior CT in July 2016. Her ABGs were significantly abnormal, in the setting of desaturation. Her CMET and CBC are essentially unremarkable with the exception of elevated white count of 12, likely reactive. The NP is elevated at 340.6.  troponin is 0.1.9 EKG was performed to date.  Review of Systems  Constitutional: Negative for fever, chills, weight loss, malaise/fatigue and diaphoresis.  HENT: Negative.   Eyes: Negative.   Respiratory: Positive for cough, shortness of breath and wheezing. Negative for hemoptysis and sputum production.   Cardiovascular: Negative.   Gastrointestinal: Negative for heartburn, nausea, vomiting, abdominal pain, diarrhea, constipation, blood in stool and melena.  Genitourinary: Negative.   Musculoskeletal: Negative.   Skin: Negative.   Neurological: Negative.  Negative for weakness.  Endo/Heme/Allergies: Negative for environmental allergies and polydipsia. Does not bruise/bleed easily.  Psychiatric/Behavioral: Negative.   All other systems reviewed and are negative.   Past Medical History  Diagnosis Date  . Hypertension   . Dyslipidemia   . Hard of hearing   . Hx of radiation therapy 06/18/13- 07/23/13    LUL lung primary, 7020 cGy 26 sessions  . COPD (chronic obstructive pulmonary disease) ([place])   . prediabetes   . Lung cancer ([place]) 05/20/13  LUL, squamous cell  . Skin cancer     basal cell   Past Surgical History  Procedure Laterality Date  . Tonsillectomy      as child, repeat surgery early 20's  . Mohs surgery        "several times"  . Facial reconstruction surgery  25 years ago    to remove skin cancer, right side of face   Social History:  reports that she quit smoking about 5 months ago. Her smoking use included Cigarettes. She has a 30 pack-year smoking history. She does not have any smokeless tobacco history on file. She reports that she drinks alcohol. She reports that she does not use illicit drugs.  Allergies  Allergen Reactions  . Paxil [Paroxetine Hcl] Other (See Comments)    UNKNOWN    Family History  Problem Relation Age of Onset  . Hypertension    . Lung cancer Sister     tx w/radiation, met to stomach  . Pneumonia Mother     Deceased  . Heart attack Father     Deceased  . Cancer Sister     "back of neck"     Prior to Admission medications   Medication Sig Start Date End Date Taking? Authorizing Provider  aspirin 81 MG tablet Take 81 mg by mouth daily.   Yes Historical Provider, MD  atorvastatin (LIPITOR) 80 MG tablet TAKE 1 TABLET EVERY DAY FOR CHOLESTEROL 08/29/15  Yes Unk Pinto, MD  citalopram (CELEXA) 40 MG tablet TAKE 1 TABLET BY MOUTH DAILY FOR MOOD 10/08/15  Yes Unk Pinto, MD  doxazosin (CARDURA) 8 MG tablet TAKE 1 TABLET AT BEDTIME 06/29/15  Yes Unk Pinto, MD  guaifenesin (HUMIBID E) 400 MG TABS tablet Take 400 mg by mouth 2 (two) times daily.   Yes Historical Provider, MD  HYDROcodone-acetaminophen (NORCO) 5-325 MG tablet Take 2 tablets by mouth every 4 (four) hours as needed. 10/05/15  Yes Courtney Forcucci, PA-C  ipratropium-albuterol (DUONEB) 0.5-2.5 (3) MG/3ML SOLN TAKE 3 MLS BY NEBULIZATION 4 (FOUR) TIMES DAILY. 06/09/15  Yes Unk Pinto, MD  losartan (COZAAR) 100 MG tablet TAKE 1 TABLET BY MOUTH DAILY AS NEEDED FOR BLOOD PRESSURE 09/11/15  Yes Unk Pinto, MD  oxyCODONE-acetaminophen (PERCOCET/ROXICET) 5-325 MG per tablet Take two tablets by mouth every four hours as needed for pain. Do not exceed 4gms of Tylenol in 24 hours 05/06/15  Yes Theodis Blaze, MD  predniSONE (DELTASONE) 5 MG tablet Take 5 mg by mouth 2 (two) times daily with a meal.   Yes Historical Provider, MD  PROAIR HFA 108 (90 BASE) MCG/ACT inhaler 1 TO 2 INHALATIONS EVERY 4 HOURS AS NEEDED FOR RESCUE ASTHMA 04/18/15  Yes Vicie Mutters, PA-C  Vitamin D, Ergocalciferol, (DRISDOL) 50000 UNITS CAPS capsule TAKE ONE CAPSULE EVERY DAY 06/01/15  Yes Vicie Mutters, PA-C  albuterol (PROVENTIL) (2.5 MG/3ML) 0.083% nebulizer solution Take 3 mLs (2.5 mg total) by nebulization every 4 (four) hours as needed for wheezing. 05/06/15   Theodis Blaze, MD  azithromycin (ZITHROMAX Z-PAK) 250 MG tablet 2 po day one, then 1 daily x 4 days 10/05/15   Starlyn Skeans, PA-C  levofloxacin (LEVAQUIN) 500 MG tablet TAKE 1 TABLET EVERY DAY FOR LUNG INFECTION 10/16/15   Unk Pinto, MD   Physical Exam: Filed Vitals:   10/16/15 1554 10/16/15 1600 10/16/15 1615 10/16/15 1700  BP: 125/63 110/99 124/70 118/79  Pulse: 90 86 88   Temp:      TempSrc:  Resp: '27 16 19 14  '$ SpO2: 98% 96% 97%     Wt Readings from Last 3 Encounters:  10/05/15 76.204 kg (168 lb)  06/27/15 67.314 kg (148 lb 6.4 oz)  06/06/15 63.866 kg (140 lb 12.8 oz)    Physical Exam  Constitutional: She is oriented to person, place, and time. She appears distressed.  HENT:  Head: Normocephalic and atraumatic.  Mouth/Throat: No oropharyngeal exudate.  Eyes: EOM are normal. Pupils are equal, round, and reactive to light. No scleral icterus.  Neck: Neck supple. No JVD present. No tracheal deviation present. No thyromegaly present.  Cardiovascular: Normal rate and regular rhythm.  Exam reveals no gallop and no friction rub.   No murmur heard. Pulmonary/Chest: She is in respiratory distress. She has wheezes. She has no rales. She exhibits no tenderness.  On BipAp RLL rhonchi  Abdominal: Soft. Bowel sounds are normal. She exhibits no distension and no mass. There is no tenderness. There is no rebound and no guarding.    Musculoskeletal: Normal range of motion. She exhibits no edema or tenderness.  Lymphadenopathy:    She has no cervical adenopathy.  Neurological: She is alert and oriented to person, place, and time. She displays normal reflexes. She exhibits normal muscle tone.  Skin: Skin is warm and dry. No rash noted. No pallor.  Psychiatric: Mood, memory, affect and judgment normal.            Labs on Admission:  Basic Metabolic Panel:  Recent Labs Lab 10/14/15 1004 10/16/15 1303  NA 140 138  K 4.4 4.8  CL  --  96*  CO2 32* 31  GLUCOSE 174* 142*  BUN 12.8 21*  CREATININE 0.8 0.90  CALCIUM 9.3 9.2    Liver Function Tests:  Recent Labs Lab 10/14/15 1004 10/16/15 1303  AST 16 26  ALT 16 16  ALKPHOS 79 54  BILITOT 0.51 0.9  PROT 5.7* 5.1*  ALBUMIN 3.3* 3.2*    Recent Labs Lab 10/14/15 1004 10/16/15 1303  WBC 11.0* 12.0*  NEUTROABS 8.8* 9.3*  HGB 12.7 11.8*  HCT 40.0 38.1  MCV 92.0 97.4  PLT 174 160    Cardiac Enzymes: No results for input(s): CKTOTAL, CKMB, CKMBINDEX, TROPONINI in the last 168 hours.  BNP (last 3 results)  Recent Labs  10/16/15 1304  BNP 340.6*     Radiological Exams on Admission: Ct Angio Chest Pe W/cm &/or Wo Cm  10/16/2015  CLINICAL DATA:  Respiratory distress, hypoxia. History of lung cancer status post radiation. EXAM: CT ANGIOGRAPHY CHEST WITH CONTRAST TECHNIQUE: Multidetector CT imaging of the chest was performed using the standard protocol during bolus administration of intravenous contrast. Multiplanar CT image reconstructions and MIPs were obtained to evaluate the vascular anatomy. CONTRAST:  67m OMNIPAQUE IOHEXOL 350 MG/ML SOLN [facility]  [facility] FINDINGS: No evidence of pulmonary embolism. Mediastinum/Nodes: Cardiomegaly. Coronary atherosclerosis. Atherosclerotic calcifications of the aortic arch. Mild thoracic lymphadenopathy, including: --11 mm short axis prevascular node (series 601/image  28) --11 mm short axis right paratracheal node (series 601/ image 34) --12 mm short axis subcarinal node (series 601/ image 39) --10 mm short axis left hilar node (series 601/image 40) Visualized thyroid is unremarkable. Lungs/Pleura: No interval change from recent CT. Stable volume loss in the right middle lobe. Stable radiation changes/ scarring in the left upper lobe. Mild dependent atelectasis in the bilateral lower lobes. Underlying moderate centrilobular and paraseptal emphysematous changes. Superimposed interstitial lung disease is suspected at the lung bases.  Scattered small pulmonary nodules measuring up to 5 mm, described on CT report dated 10/14/2015, less conspicuous on the current motion degraded exam. No pleural effusion or pneumothorax. Upper abdomen: Visualized upper abdomen is notable for vascular calcifications and vicarious excretion of contrast in the gallbladder. Musculoskeletal: Degenerative changes of the visualized thoracolumbar spine. Moderate loss of height at T4 with prior vertebral augmentation. Prior vertebral augmentation at T3. Review of the MIP images confirms the above findings. IMPRESSION: No evidence of pulmonary embolism. Otherwise unchanged from CT chest dated 10/14/2015. Electronically Signed   By: Charline Bills M.D.   On: 10/16/2015 15:18   Dg Chest Port 1 View  10/16/2015  CLINICAL DATA:  Shortness of breath, respiratory distress. Home O2 sats at 60%. History of lung cancer in 2014, COPD, hypertension. EXAM: PORTABLE CHEST 1 VIEW COMPARISON:  Chest x-ray dated 05/04/2015. Comparison also to a chest CT dated 10/14/2015. FINDINGS: Mild cardiomegaly is stable. Overall cardiomediastinal silhouette is stable in size and configuration. Again noted is a mild prominence of the central perihilar bronchovascular markings, not significantly changed compared to multiple prior studies, perhaps related to the chronic mild volume overload or chronic bronchitis. The patchy bilateral  interstitial and alveolar airspace opacities are unchanged compared to multiple prior studies, again likely chronic in nature and perhaps related to the question of chronic interstitial lung disease described on recent chest CT. No new lung findings seen. No new confluent opacity to suggest a developing pneumonia. No pleural effusion. No pneumothorax seen. Osseous structures about the chest are unremarkable. IMPRESSION: 1. No acute findings.  No evidence of pneumonia. 2. Probable chronic mild volume overload/CHF or chronic bronchitic changes. Also chronic interstitial thickening which is likely related to the recent CT report questioning a chronic interstitial lung disease. Electronically Signed   By: Bary Richard M.D.   On: 10/16/2015 13:26      Assessment/Plan Principal Problem:   Acute respiratory failure with hypoxia (HCC) Active Problems:   Mixed hyperlipidemia   Essential hypertension   COPD mixed type (HCC)   Prediabetes   Acute on chronic respiratory failure with hypercapnia (HCC)   DNR (do not resuscitate) discussion   Thoracic compression fracture (HCC)  Acute Respiratory Failure with Hypoxia and Hypercapnia with probable acute COPD exacerbation.Presenting now with ongoing / progressive symptoms including productive cough, and O sats down to 60% requiring BPAP. CT angiogram of the chest, essentially unchanged from prior CT in July 2016 - Will admit to stepdown for evaluation and management of symptoms - Continue BiPAP - After sputum cultures are obtained, consider antibiotic coverage with levaquin. - Consider Procalcitonin - IV Nebulizers as needed  - Mucinex as needed  - Repeat CBC in am  Acute exacerbation congestive heart failure: BNP elevated at 327. With signs of pulmonary congestion on chest x-ray. - Heart failure protocol - IV Lasix 40 mg IV times first dose now - Echocardiogram in a.m.  - Consult cardiology in a.m.   Hypertension Stable. Continue home  anti-hypertensive medications.   Leukocytosis WBC 12.  Likely is reactive,in the setting of home steroids.   However, due to her history of lung cancer, which places her at a immunocompromised state, will place her on antibiotics with Levaquin IV after cultures are drawn Repeat CBC in am   History of Lung Cancer, on observation CT angio shows stable disease  Code Status: DNR  DVT Prophylaxis: Lovenox  Family Communication:  Family at bedside  Disposition Plan: Pending Improvement. Admitted for observation in stepdown as patient  is hypoxic requiring BiPap. Expected LOS 24-48 hrs    [facility] E,PA-C [facility] [email or web address] Password [email or web address]

## 2015-10-16 NOTE — ED Notes (Signed)
Pt. BIB EMS for respiratory distress. Home O2 sats 60% on RA, 80% NRB. Pt. Brought in on CPAP. Pt. Coming from  Home, hx of Lung CA and COPD. Complaint of cough/URI/congestion.

## 2015-10-16 NOTE — ED Notes (Signed)
Admitting doctor at the bedside.  The pt remains on bi-pap

## 2015-10-16 NOTE — ED Provider Notes (Signed)
CSN: [id number]     Arrival date & time 10/16/15  1230 History   First MD Initiated Contact with Patient 10/16/15 1230     Chief Complaint  Patient presents with  . Respiratory Distress     (Consider location/radiation/quality/duration/timing/severity/associated sxs/prior Treatment) Patient is a 81 y.o. female presenting with general illness. The history is provided by the patient and the EMS personnel.  Illness Severity:  Moderate Onset quality:  Sudden Duration:  2 days Timing:  Constant Progression:  Worsening Chronicity:  Recurrent Associated symptoms: cough and shortness of breath   Associated symptoms: no chest pain, no congestion, no fever, no headaches, no myalgias, no nausea, no rhinorrhea, no vomiting and no wheezing    81 yo F with a chief complaints of shortness of breath. This been going on for about a day. History limited as patient is on BiPAP. Per EMS the patient was found to be hypoxic with saturations 60% on room air. She is placed on nonrebreather which had some minimal improvement. Since then started on CPAP. Had improvement into the 90s. Patient has a history of COPD and thinks this may be similar. Denies any missing of medications. Has some lower extremity edema that is chronic. Left greater than right.  Family bedside able to provide better history. Says the patient had a URI for the past week or so. Seen by PCP and started on azithromycin. Having worsening of her symptoms. Cough and congestion. Denies any worsening of her lower extremity edema. Deny fevers. This morning they were trying to titrate her home oxygen were unable to get her over 70%.   Past Medical History  Diagnosis Date  . Hypertension   . Dyslipidemia   . Hard of hearing   . Hx of radiation therapy 06/18/13- 07/23/13    LUL lung primary, 7020 cGy 26 sessions  . COPD (chronic obstructive pulmonary disease) ([place])   . prediabetes   . 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 Skin  cancer     basal cell   Past Surgical History  Procedure Laterality Date  . Tonsillectomy      as child, repeat surgery early 20's  . Mohs surgery      "several times"  . Facial reconstruction surgery  25 years ago    to remove skin cancer, right side of face   Family History  Problem Relation Age of Onset  . Hypertension    . Lung cancer Sister     tx w/radiation, met to stomach  . Pneumonia Mother     Deceased  . Heart attack Father     Deceased  . Cancer Sister     "back of neck"   Social History  Substance Use Topics  . Smoking status: Former Smoker -- 0.50 packs/day for 60 years    Types: Cigarettes    Quit date: 05/12/2015  . Smokeless tobacco: None     Comment: 06/05/13 currently 1/2 PPD, trying to quit  . Alcohol Use: Yes     Comment: occ wine, 2 drinks nightly   OB History    No data available     Review of Systems  Constitutional: Negative for fever and chills.  HENT: Negative for congestion and rhinorrhea.   Eyes: Negative for redness and visual disturbance.  Respiratory: Positive for cough and shortness of breath. Negative for wheezing.   Cardiovascular: Negative for chest pain and palpitations.  Gastrointestinal: Negative for nausea and vomiting.  Genitourinary: Negative for dysuria  and urgency.  Musculoskeletal: Negative for myalgias and arthralgias.  Skin: Negative for pallor and wound.  Neurological: Negative for dizziness and headaches.      Allergies  Paxil  Home Medications   Prior to Admission medications   Medication Sig Start Date End Date Taking? Authorizing Provider  aspirin 81 MG tablet Take 81 mg by mouth daily.   Yes Historical Provider, MD  atorvastatin (LIPITOR) 80 MG tablet TAKE 1 TABLET EVERY DAY FOR CHOLESTEROL 08/29/15  Yes Lucky Cowboy, MD  citalopram (CELEXA) 40 MG tablet TAKE 1 TABLET BY MOUTH DAILY FOR MOOD 10/08/15  Yes Lucky Cowboy, MD  doxazosin (CARDURA) 8 MG tablet TAKE 1 TABLET AT BEDTIME 06/29/15  Yes  Lucky Cowboy, MD  guaifenesin (HUMIBID E) 400 MG TABS tablet Take 400 mg by mouth 2 (two) times daily.   Yes Historical Provider, MD  HYDROcodone-acetaminophen (NORCO) 5-325 MG tablet Take 2 tablets by mouth every 4 (four) hours as needed. 10/05/15  Yes Courtney Forcucci, PA-C  ipratropium-albuterol (DUONEB) 0.5-2.5 (3) MG/3ML SOLN TAKE 3 MLS BY NEBULIZATION 4 (FOUR) TIMES DAILY. 06/09/15  Yes Lucky Cowboy, MD  losartan (COZAAR) 100 MG tablet TAKE 1 TABLET BY MOUTH DAILY AS NEEDED FOR BLOOD PRESSURE 09/11/15  Yes Lucky Cowboy, MD  oxyCODONE-acetaminophen (PERCOCET/ROXICET) 5-325 MG per tablet Take two tablets by mouth every four hours as needed for pain. Do not exceed 4gms of Tylenol in 24 hours 05/06/15  Yes Dorothea Ogle, MD  predniSONE (DELTASONE) 5 MG tablet Take 5 mg by mouth 2 (two) times daily with a meal.   Yes Historical Provider, MD  PROAIR HFA 108 (90 BASE) MCG/ACT inhaler 1 TO 2 INHALATIONS EVERY 4 HOURS AS NEEDED FOR RESCUE ASTHMA 04/18/15  Yes Quentin Mulling, PA-C  Vitamin D, Ergocalciferol, (DRISDOL) 50000 UNITS CAPS capsule TAKE ONE CAPSULE EVERY DAY 06/01/15  Yes Quentin Mulling, PA-C  albuterol (PROVENTIL) (2.5 MG/3ML) 0.083% nebulizer solution Take 3 mLs (2.5 mg total) by nebulization every 4 (four) hours as needed for wheezing. 05/06/15   Dorothea Ogle, MD  azithromycin (ZITHROMAX Z-PAK) 250 MG tablet 2 po day one, then 1 daily x 4 days 10/05/15   Toni Amend Forcucci, PA-C  levofloxacin (LEVAQUIN) 500 MG tablet TAKE 1 TABLET EVERY DAY FOR LUNG INFECTION 10/16/15   Lucky Cowboy, MD   BP 110/99 mmHg  Pulse 86  Temp(Src) 97.8 F (36.6 C) (Temporal)  Resp 16  SpO2 96% Physical Exam  Constitutional: She is oriented to person, place, and time. She appears well-developed and well-nourished. No distress.  HENT:  Head: Normocephalic and atraumatic.  Eyes: EOM are normal. Pupils are equal, round, and reactive to light.  Neck: Normal range of motion. Neck supple.  Cardiovascular: Normal  rate and regular rhythm.  Exam reveals no gallop and no friction rub.   No murmur heard. Pulmonary/Chest: She is in respiratory distress. She has wheezes. She has no rales.  Abdominal: Soft. She exhibits no distension. There is no tenderness. There is no rebound and no guarding.  Musculoskeletal: She exhibits no edema or tenderness.  Neurological: She is alert and oriented to person, place, and time.  Skin: Skin is warm and dry. She is not diaphoretic.  Psychiatric: She has a normal mood and affect. Her behavior is normal.  Nursing note and vitals reviewed.   ED Course  Procedures (including critical care time) Labs Review Labs Reviewed  CBC WITH DIFFERENTIAL/PLATELET - Abnormal; Notable for the following:    WBC 12.0 (*)    Hemoglobin  11.8 (*)    Neutro Abs 9.3 (*)    Monocytes Absolute 1.8 (*)    All other components within normal limits  COMPREHENSIVE METABOLIC PANEL - Abnormal; Notable for the following:    Chloride 96 (*)    Glucose, Bld 142 (*)    BUN 21 (*)    Total Protein 5.1 (*)    Albumin 3.2 (*)    GFR calc non Af Amer 60 (*)    All other components within normal limits  BRAIN NATRIURETIC PEPTIDE - Abnormal; Notable for the following:    B Natriuretic Peptide 340.6 (*)    All other components within normal limits  URINALYSIS, ROUTINE W REFLEX MICROSCOPIC (NOT AT [facility]) - Abnormal; Notable for the following:    Color, Urine AMBER (*)    All other components within normal limits  I-STAT TROPOININ, ED - Abnormal; Notable for the following:    Troponin i, poc 0.10 (*)    All other components within normal limits  I-STAT ARTERIAL BLOOD GAS, ED - Abnormal; Notable for the following:    pH, Arterial 7.293 (*)    pCO2 arterial 69.0 (*)    pO2, Arterial 61.0 (*)    Bicarbonate 33.6 (*)    Acid-Base Excess 5.0 (*)    All other components within normal limits  CULTURE, BLOOD (ROUTINE X 2)  CULTURE, BLOOD (ROUTINE X 2)    Imaging Review Ct Angio Chest Pe W/cm &/or Wo  Cm  10/16/2015  CLINICAL DATA:  Respiratory distress, hypoxia. History of lung cancer status post radiation. EXAM: CT ANGIOGRAPHY CHEST WITH CONTRAST TECHNIQUE: Multidetector CT imaging of the chest was performed using the standard protocol during bolus administration of intravenous contrast. Multiplanar CT image reconstructions and MIPs were obtained to evaluate the vascular anatomy. CONTRAST:  76m OMNIPAQUE IOHEXOL 350 MG/ML SOLN [facility]  [facility] FINDINGS: No evidence of pulmonary embolism. Mediastinum/Nodes: Cardiomegaly. Coronary atherosclerosis. Atherosclerotic calcifications of the aortic arch. Mild thoracic lymphadenopathy, including: --11 mm short axis prevascular node (series 601/image 28) --11 mm short axis right paratracheal node (series 601/ image 34) --12 mm short axis subcarinal node (series 601/ image 39) --10 mm short axis left hilar node (series 601/image 40) Visualized thyroid is unremarkable. Lungs/Pleura: No interval change from recent CT. Stable volume loss in the right middle lobe. Stable radiation changes/ scarring in the left upper lobe. Mild dependent atelectasis in the bilateral lower lobes. Underlying moderate centrilobular and paraseptal emphysematous changes. Superimposed interstitial lung disease is suspected at the lung bases. Scattered small pulmonary nodules measuring up to 5 mm, described on CT report dated 10/14/2015, less conspicuous on the current motion degraded exam. No pleural effusion or pneumothorax. Upper abdomen: Visualized upper abdomen is notable for vascular calcifications and vicarious excretion of contrast in the gallbladder. Musculoskeletal: Degenerative changes of the visualized thoracolumbar spine. Moderate loss of height at T4 with prior vertebral augmentation. Prior vertebral augmentation at T3. Review of the MIP images confirms the above findings. IMPRESSION: No evidence of pulmonary embolism. Otherwise unchanged from  CT chest dated 10/14/2015. Electronically Signed   By: SJulian HyM.D.   On: 10/16/2015 15:18   Dg Chest Port 1 View  10/16/2015  CLINICAL DATA:  Shortness of breath, respiratory distress. Home O2 sats at 60%. History of lung cancer in 2014, COPD, hypertension. EXAM: PORTABLE CHEST 1 VIEW COMPARISON:  Chest x-ray dated 05/04/2015. Comparison also to a chest CT dated 10/14/2015. FINDINGS: Mild cardiomegaly is stable. Overall cardiomediastinal silhouette  is stable in size and configuration. Again noted is a mild prominence of the central perihilar bronchovascular markings, not significantly changed compared to multiple prior studies, perhaps related to the chronic mild volume overload or chronic bronchitis. The patchy bilateral interstitial and alveolar airspace opacities are unchanged compared to multiple prior studies, again likely chronic in nature and perhaps related to the question of chronic interstitial lung disease described on recent chest CT. No new lung findings seen. No new confluent opacity to suggest a developing pneumonia. No pleural effusion. No pneumothorax seen. Osseous structures about the chest are unremarkable. IMPRESSION: 1. No acute findings.  No evidence of pneumonia. 2. Probable chronic mild volume overload/CHF or chronic bronchitic changes. Also chronic interstitial thickening which is likely related to the recent CT report questioning a chronic interstitial lung disease. Electronically Signed   By: Franki Cabot M.D.   On: 10/16/2015 13:26   I have personally reviewed and evaluated these images and lab results as part of my medical decision-making.   EKG Interpretation None      MDM   Final diagnoses:  Hypoxia  COPD exacerbation ([place])    81 yo F with shortness of breath. Sounds like pneumonia based on history. Patient's lung exam is more consistent with COPD exacerbation. Give the patient a continuous nebulizer while on BiPAP. Patient feeling really good on BiPAP.  Chest x-ray to me looks like pneumonia however radiology feels like these are old findings for her lung cancer. See some things that look like fluid overload. Will obtain a CT scan to evaluate for the possibility of PE. Patient also mildly hypotensive transiently. Will give a small fluid bolus. Covering broadly with antibiotics for pneumonia.  CT scan negative for PE, will admit.   CRITICAL CARE Performed by: Cecilio Asper   Total critical care time: 80 minutes  Critical care time was exclusive of separately billable procedures and treating other patients.  Critical care was necessary to treat or prevent imminent or life-threatening deterioration.  Critical care was time spent personally by me on the following activities: development of treatment plan with patient and/or surrogate as well as nursing, discussions with consultants, evaluation of patient's response to treatment, examination of patient, obtaining history from patient or surrogate, ordering and performing treatments and interventions, ordering and review of laboratory studies, ordering and review of radiographic studies, pulse oximetry and re-evaluation of patient's condition.   The patients results and plan were reviewed and discussed.   Any x-rays performed were independently reviewed by myself.   Differential diagnosis were considered with the presenting HPI.  Medications  methylPREDNISolone sodium succinate (SOLU-MEDROL) 125 mg/2 mL injection 125 mg (125 mg Intravenous Given 10/16/15 1301)  albuterol (PROVENTIL,VENTOLIN) solution continuous neb (10 mg/hr Nebulization Given 10/16/15 1254)  ipratropium (ATROVENT) nebulizer solution 1.5 mg (1.5 mg Nebulization Given 10/16/15 1254)  vancomycin (VANCOCIN) 1,500 mg in sodium chloride 0.9 % 500 mL IVPB (0 mg Intravenous Stopped 10/16/15 1612)  piperacillin-tazobactam (ZOSYN) IVPB 3.375 g (0 g Intravenous Stopped 10/16/15 1415)  sodium chloride 0.9 % bolus 500 mL (0 mLs  Intravenous Stopped 10/16/15 1415)  iohexol (OMNIPAQUE) 350 MG/ML injection 100 mL (60 mLs Intravenous Contrast Given 10/16/15 1451)  fentaNYL (SUBLIMAZE) injection 50 mcg (50 mcg Intravenous Given 10/16/15 1617)    Filed Vitals:   10/16/15 1530 10/16/15 1545 10/16/15 1554 10/16/15 1600  BP: 102/75 125/63 125/63 110/99  Pulse: 86 87 90 86  Temp:      TempSrc:      Resp: 20  $'20 27 16  'n$ SpO2: 98% 99% 98% 96%    Final diagnoses:  Hypoxia  COPD exacerbation ([place])    Admission/ observation were discussed with the admitting physician, patient and/or family and they are comfortable with the plan.    Deno Etienne, DO 10/16/15 1623

## 2015-10-16 NOTE — Progress Notes (Signed)
Patient transported on BiPAP to CT and back. Vitals stable.

## 2015-10-16 NOTE — ED Notes (Signed)
Report called to rn on 2c

## 2015-10-16 NOTE — ED Notes (Signed)
Pt alert    Daughter at the bedside  Vancomycin still infusing

## 2015-10-16 NOTE — Progress Notes (Signed)
Critical ABG results given to MD. No changes at this time.

## 2015-10-16 NOTE — Progress Notes (Signed)
Pt arrived to 2C15 around 1900.  VS's are stable, pt is alert but difficult to assess cognition due to BIPAP mask.  She appears very agitated/anxious pulling at everything and is experiencing a lot of twitching/jerky movements that her daughter says is related to her back pain? RN will continue to monitor.

## 2015-10-16 NOTE — ED Notes (Signed)
The pt is  Having back pain   Med giuven and now she is more comnfortable

## 2015-10-17 DIAGNOSIS — Z7952 Long term (current) use of systemic steroids: Secondary | ICD-10-CM | POA: Diagnosis not present

## 2015-10-17 DIAGNOSIS — K5900 Constipation, unspecified: Secondary | ICD-10-CM | POA: Diagnosis not present

## 2015-10-17 DIAGNOSIS — Z85118 Personal history of other malignant neoplasm of bronchus and lung: Secondary | ICD-10-CM | POA: Diagnosis not present

## 2015-10-17 DIAGNOSIS — I10 Essential (primary) hypertension: Secondary | ICD-10-CM | POA: Diagnosis not present

## 2015-10-17 DIAGNOSIS — I110 Hypertensive heart disease with heart failure: Secondary | ICD-10-CM | POA: Diagnosis present

## 2015-10-17 DIAGNOSIS — J9601 Acute respiratory failure with hypoxia: Secondary | ICD-10-CM | POA: Diagnosis not present

## 2015-10-17 DIAGNOSIS — Z85828 Personal history of other malignant neoplasm of skin: Secondary | ICD-10-CM | POA: Diagnosis not present

## 2015-10-17 DIAGNOSIS — Z923 Personal history of irradiation: Secondary | ICD-10-CM | POA: Diagnosis not present

## 2015-10-17 DIAGNOSIS — R7303 Prediabetes: Secondary | ICD-10-CM | POA: Diagnosis present

## 2015-10-17 DIAGNOSIS — H9190 Unspecified hearing loss, unspecified ear: Secondary | ICD-10-CM | POA: Diagnosis present

## 2015-10-17 DIAGNOSIS — I509 Heart failure, unspecified: Secondary | ICD-10-CM | POA: Diagnosis not present

## 2015-10-17 DIAGNOSIS — Z7982 Long term (current) use of aspirin: Secondary | ICD-10-CM | POA: Diagnosis not present

## 2015-10-17 DIAGNOSIS — Z66 Do not resuscitate: Secondary | ICD-10-CM | POA: Diagnosis present

## 2015-10-17 DIAGNOSIS — J441 Chronic obstructive pulmonary disease with (acute) exacerbation: Secondary | ICD-10-CM | POA: Diagnosis not present

## 2015-10-17 DIAGNOSIS — I5033 Acute on chronic diastolic (congestive) heart failure: Secondary | ICD-10-CM | POA: Diagnosis not present

## 2015-10-17 DIAGNOSIS — Z87891 Personal history of nicotine dependence: Secondary | ICD-10-CM | POA: Diagnosis not present

## 2015-10-17 DIAGNOSIS — J9621 Acute and chronic respiratory failure with hypoxia: Secondary | ICD-10-CM | POA: Diagnosis present

## 2015-10-17 DIAGNOSIS — D899 Disorder involving the immune mechanism, unspecified: Secondary | ICD-10-CM | POA: Diagnosis present

## 2015-10-17 DIAGNOSIS — J440 Chronic obstructive pulmonary disease with acute lower respiratory infection: Secondary | ICD-10-CM | POA: Diagnosis present

## 2015-10-17 DIAGNOSIS — J9622 Acute and chronic respiratory failure with hypercapnia: Secondary | ICD-10-CM | POA: Diagnosis not present

## 2015-10-17 DIAGNOSIS — J189 Pneumonia, unspecified organism: Secondary | ICD-10-CM | POA: Diagnosis present

## 2015-10-17 DIAGNOSIS — E876 Hypokalemia: Secondary | ICD-10-CM | POA: Diagnosis not present

## 2015-10-17 DIAGNOSIS — M545 Low back pain: Secondary | ICD-10-CM | POA: Diagnosis present

## 2015-10-17 DIAGNOSIS — R0602 Shortness of breath: Secondary | ICD-10-CM | POA: Diagnosis present

## 2015-10-17 DIAGNOSIS — E782 Mixed hyperlipidemia: Secondary | ICD-10-CM | POA: Diagnosis present

## 2015-10-17 DIAGNOSIS — G8929 Other chronic pain: Secondary | ICD-10-CM | POA: Diagnosis present

## 2015-10-17 LAB — BLOOD GAS, ARTERIAL
Acid-Base Excess: 8.9 mmol/L — ABNORMAL HIGH (ref 0.0–2.0)
BICARBONATE: 34.4 meq/L — AB (ref 20.0–24.0)
Delivery systems: POSITIVE
Drawn by: 275531
EXPIRATORY PAP: 8
FIO2: 0.5
INSPIRATORY PAP: 14
Mode: POSITIVE
O2 SAT: 97.1 %
PATIENT TEMPERATURE: 98.6
PCO2 ART: 62.5 mmHg — AB (ref 35.0–45.0)
TCO2: 36.3 mmol/L (ref 0–100)
pH, Arterial: 7.359 (ref 7.350–7.450)
pO2, Arterial: 93.5 mmHg (ref 80.0–100.0)

## 2015-10-17 LAB — COMPREHENSIVE METABOLIC PANEL
ALT: 19 U/L (ref 14–54)
AST: 31 U/L (ref 15–41)
Albumin: 2.9 g/dL — ABNORMAL LOW (ref 3.5–5.0)
Alkaline Phosphatase: 57 U/L (ref 38–126)
Anion gap: 11 (ref 5–15)
BUN: 19 mg/dL (ref 6–20)
CHLORIDE: 98 mmol/L — AB (ref 101–111)
CO2: 31 mmol/L (ref 22–32)
CREATININE: 0.88 mg/dL (ref 0.44–1.00)
Calcium: 8.8 mg/dL — ABNORMAL LOW (ref 8.9–10.3)
GFR calc non Af Amer: >60 mL/min (ref >60)
Glucose, Bld: 166 mg/dL — ABNORMAL HIGH (ref 65–99)
Potassium: 4.7 mmol/L (ref 3.5–5.1)
SODIUM: 140 mmol/L (ref 135–145)
Total Bilirubin: 0.8 mg/dL (ref 0.3–1.2)
Total Protein: 5.3 g/dL — ABNORMAL LOW (ref 6.5–8.1)

## 2015-10-17 LAB — MRSA PCR SCREENING: MRSA by PCR: NEGATIVE

## 2015-10-17 LAB — TROPONIN I
TROPONIN I: 0.07 ng/mL — AB (ref <0.031)
Troponin I: 0.08 ng/mL — ABNORMAL HIGH (ref <0.031)

## 2015-10-17 LAB — GLUCOSE, CAPILLARY: Glucose-Capillary: 116 mg/dL — ABNORMAL HIGH (ref 65–99)

## 2015-10-17 LAB — STREP PNEUMONIAE URINARY ANTIGEN: Strep Pneumo Urinary Antigen: NEGATIVE

## 2015-10-17 MED ORDER — METHYLPREDNISOLONE SODIUM SUCC 125 MG IJ SOLR
60.0000 mg | Freq: Four times a day (QID) | INTRAMUSCULAR | Status: DC
  Administered 2015-10-17 – 2015-10-19 (×8): 60 mg via INTRAVENOUS
  Filled 2015-10-17 (×8): qty 2

## 2015-10-17 MED ORDER — CYCLOBENZAPRINE HCL 5 MG PO TABS
5.0000 mg | ORAL_TABLET | Freq: Three times a day (TID) | ORAL | Status: DC | PRN
  Administered 2015-10-17 – 2015-10-20 (×6): 5 mg via ORAL
  Filled 2015-10-17 (×7): qty 1

## 2015-10-17 NOTE — Progress Notes (Signed)
Pt is currently off BIPAP. Order is now PRN. BIPAP is available @ bedside. Pt is currently wearing 6L [place] (Sats 94%). Last check pt was sleeping. RT will monitor.

## 2015-10-17 NOTE — Progress Notes (Signed)
PROGRESS NOTE  CRESCENT GOTHAM OXB:[id number] DOB: [birth year] DOA: 10/16/2015 PCP: Alesia Richards, MD   HPI: 81 y.o. female with a Past Medical History of COPD, stage II non-small cell lung cancer s/p radiation; who presented with complaints of shortness of breath. Initial ABG showing signs of acute hypoxemic and hypercapnic respiratory failure.   Subjective / 24 H Interval events - on BiPAP this morning, somewhat confused  Assessment/Plan: Principal Problem:   Acute respiratory failure with hypoxia (HCC) Active Problems:   Mixed hyperlipidemia   Essential hypertension   COPD mixed type (HCC)   Prediabetes   Acute on chronic respiratory failure with hypercapnia (HCC)   DNR (do not resuscitate) discussion   Thoracic compression fracture ([place])   Acute exacerbation of congestive heart failure (HCC)    Acute on Chronic Respiratory Failure with Hypoxia and Hypercapnia with acute COPD exacerbation - continue BiPAP, ABG this morning improving 7.35/62/93, has chronic retention at baseline - wean off as tolerated - continue IV steroids, antibiotics, nebulizers  Acute exacerbation congestive heart failure  - BNP elevated at 327. With signs of pulmonary congestion on chest x-ray. - 2D echo pending, continue IV diuresis, monitor BMP - slight troponin elevation, flat, likely demand  Hypertension - Stable - Continue home anti-hypertensive medications.   Leukocytosis - Likely is reactive,in the setting of home steroids.   History of Lung Cancer, on observation - CT angio shows stable disease   Diet: Diet Carb Modified Fluid consistency:: Thin; Room service appropriate?: Yes Fluids: none  DVT Prophylaxis: Lovenox  Code Status: DNR Family Communication: d/w daughter   Disposition Plan: remain in SDU  Barriers to discharge: respiratory failure  Consultants:  None   Procedures:  None    Antibiotics Levofloxacin 2/12 >>   Studies  Ct Angio Chest Pe  W/cm &/or Wo Cm  10/16/2015  CLINICAL DATA:  Respiratory distress, hypoxia. History of lung cancer status post radiation. EXAM: CT ANGIOGRAPHY CHEST WITH CONTRAST TECHNIQUE: Multidetector CT imaging of the chest was performed using the standard protocol during bolus administration of intravenous contrast. Multiplanar CT image reconstructions and MIPs were obtained to evaluate the vascular anatomy. CONTRAST:  66m OMNIPAQUE IOHEXOL 350 MG/ML SOLN [facility]  [facility] FINDINGS: No evidence of pulmonary embolism. Mediastinum/Nodes: Cardiomegaly. Coronary atherosclerosis. Atherosclerotic calcifications of the aortic arch. Mild thoracic lymphadenopathy, including: --11 mm short axis prevascular node (series 601/image 28) --11 mm short axis right paratracheal node (series 601/ image 34) --12 mm short axis subcarinal node (series 601/ image 39) --10 mm short axis left hilar node (series 601/image 40) Visualized thyroid is unremarkable. Lungs/Pleura: No interval change from recent CT. Stable volume loss in the right middle lobe. Stable radiation changes/ scarring in the left upper lobe. Mild dependent atelectasis in the bilateral lower lobes. Underlying moderate centrilobular and paraseptal emphysematous changes. Superimposed interstitial lung disease is suspected at the lung bases. Scattered small pulmonary nodules measuring up to 5 mm, described on CT report dated 10/14/2015, less conspicuous on the current motion degraded exam. No pleural effusion or pneumothorax. Upper abdomen: Visualized upper abdomen is notable for vascular calcifications and vicarious excretion of contrast in the gallbladder. Musculoskeletal: Degenerative changes of the visualized thoracolumbar spine. Moderate loss of height at T4 with prior vertebral augmentation. Prior vertebral augmentation at T3. Review of the MIP images confirms the above findings. IMPRESSION: No evidence of pulmonary embolism. Otherwise  unchanged from CT chest dated 10/14/2015. Electronically Signed   By: SHenderson NewcomerD.  On: 10/16/2015 15:18   Dg Chest Port 1 View  10/16/2015  CLINICAL DATA:  Shortness of breath, respiratory distress. Home O2 sats at 60%. History of lung cancer in 2014, COPD, hypertension. EXAM: PORTABLE CHEST 1 VIEW COMPARISON:  Chest x-ray dated 05/04/2015. Comparison also to a chest CT dated 10/14/2015. FINDINGS: Mild cardiomegaly is stable. Overall cardiomediastinal silhouette is stable in size and configuration. Again noted is a mild prominence of the central perihilar bronchovascular markings, not significantly changed compared to multiple prior studies, perhaps related to the chronic mild volume overload or chronic bronchitis. The patchy bilateral interstitial and alveolar airspace opacities are unchanged compared to multiple prior studies, again likely chronic in nature and perhaps related to the question of chronic interstitial lung disease described on recent chest CT. No new lung findings seen. No new confluent opacity to suggest a developing pneumonia. No pleural effusion. No pneumothorax seen. Osseous structures about the chest are unremarkable. IMPRESSION: 1. No acute findings.  No evidence of pneumonia. 2. Probable chronic mild volume overload/CHF or chronic bronchitic changes. Also chronic interstitial thickening which is likely related to the recent CT report questioning a chronic interstitial lung disease. Electronically Signed   By: Franki Cabot M.D.   On: 10/16/2015 13:26    Objective  Filed Vitals:   10/17/15 0400 10/17/15 0427 10/17/15 0734 10/17/15 1101  BP: 148/69  129/50 109/61  Pulse: 73  83 77  Temp: 97.6 F (36.4 C)     TempSrc: Axillary     Resp: '19  18 13  '$ Height:      Weight:  97.6 kg (215 lb 2.7 oz)    SpO2: 100%  100% 97%    Intake/Output Summary (Last 24 hours) at 10/17/15 1110 Last data filed at 10/17/15 0900  Gross per 24 hour  Intake      0 ml  Output      2 ml   Net     -2 ml   Filed Weights   10/16/15 1910 10/17/15 0427  Weight: 99.6 kg (219 lb 9.3 oz) 97.6 kg (215 lb 2.7 oz)    Exam:  GENERAL: sleeping, on BiPAP  HEENT: no scleral icterus  NECK: supple  LUNGS: slight wheezing, coarse breath sounds, overall decreased  HEART: RRR without MRG  ABDOMEN: soft, non tender  NEUROLOGIC: non focal  SKIN: no rashes  Data Reviewed: Basic Metabolic Panel:  Recent Labs Lab 10/14/15 1004 10/16/15 1303 10/17/15 0215  NA 140 138 140  K 4.4 4.8 4.7  CL  --  96* 98*  CO2 32* 31 31  GLUCOSE 174* 142* 166*  BUN 12.8 21* 19  CREATININE 0.8 0.90 0.88  CALCIUM 9.3 9.2 8.8*   Liver Function Tests:  Recent Labs Lab 10/14/15 1004 10/16/15 1303 10/17/15 0215  AST '16 26 31  '$ ALT '16 16 19  '$ ALKPHOS 79 54 57  BILITOT 0.51 0.9 0.8  PROT 5.7* 5.1* 5.3*  ALBUMIN 3.3* 3.2* 2.9*   CBC:  Recent Labs Lab 10/14/15 1004 10/16/15 1303  WBC 11.0* 12.0*  NEUTROABS 8.8* 9.3*  HGB 12.7 11.8*  HCT 40.0 38.1  MCV 92.0 97.4  PLT 174 160   Cardiac Enzymes:  Recent Labs Lab 10/16/15 2012 10/17/15 0215 10/17/15 0905  TROPONINI 0.11* 0.08* 0.07*   BNP (last 3 results)  Recent Labs  10/16/15 1304  BNP 340.6*   CBG:  Recent Labs Lab 10/17/15 0740  GLUCAP 116*    Recent Results (from the past 240 hour(s))  MRSA  PCR Screening     Status: None   Collection Time: 10/16/15  8:57 PM  Result Value Ref Range Status   MRSA by PCR NEGATIVE NEGATIVE Final    Comment:        The GeneXpert MRSA Assay (FDA approved for NASAL specimens only), is one component of a comprehensive MRSA colonization surveillance program. It is not intended to diagnose MRSA infection nor to guide or monitor treatment for MRSA infections.      Scheduled Meds: . antiseptic oral rinse  7 mL Mouth Rinse q12n4p  . arformoterol  15 mcg Nebulization BID  . aspirin EC  81 mg Oral Daily  . atorvastatin  80 mg Oral q1800  . budesonide (PULMICORT) nebulizer  solution  0.5 mg Nebulization BID  . chlorhexidine  15 mL Mouth Rinse BID  . citalopram  40 mg Oral Daily  . doxazosin  8 mg Oral QHS  . enoxaparin (LOVENOX) injection  40 mg Subcutaneous Q24H  . furosemide  40 mg Intravenous Q12H  . guaiFENesin  600 mg Oral BID  . levofloxacin (LEVAQUIN) IV  750 mg Intravenous Q24H  . losartan  100 mg Oral Daily  . predniSONE  5 mg Oral BID WC  . sodium chloride flush  3 mL Intravenous Q12H   Continuous Infusions:    Marzetta Board, MD [facility] Pager [phone]. If 7 PM - 7 AM, please contact night-coverage at [email or web address], password [email or web address] 10/17/2015, 11:10 AM

## 2015-10-17 NOTE — Progress Notes (Signed)
Spoke with MD he stated to hold all PO medications that he does not change to IV while pt is not able to come off Bipap d/t desat.

## 2015-10-17 NOTE — Progress Notes (Signed)
MD advised to take pts mask off after checking ABG, critical CO2 called to MD and asked to advise about taking off pts Bipap. MD stated to take off for one hour and assess patient.

## 2015-10-17 NOTE — Progress Notes (Signed)
Pt taken off Bipap and then asked to be placed on bed pan. Pt on 3L off Bipap for 5 mins became SOB and desat as low as 60's. Placed back on Bipap and made MD aware.

## 2015-10-18 ENCOUNTER — Inpatient Hospital Stay (HOSPITAL_COMMUNITY): Payer: Medicare Other

## 2015-10-18 DIAGNOSIS — I509 Heart failure, unspecified: Secondary | ICD-10-CM

## 2015-10-18 LAB — BASIC METABOLIC PANEL
ANION GAP: 11 (ref 5–15)
BUN: 29 mg/dL — ABNORMAL HIGH (ref 6–20)
CO2: 35 mmol/L — ABNORMAL HIGH (ref 22–32)
Calcium: 8.9 mg/dL (ref 8.9–10.3)
Chloride: 94 mmol/L — ABNORMAL LOW (ref 101–111)
Creatinine, Ser: 0.84 mg/dL (ref 0.44–1.00)
GFR calc Af Amer: >60 mL/min (ref >60)
Glucose, Bld: 136 mg/dL — ABNORMAL HIGH (ref 65–99)
POTASSIUM: 4.1 mmol/L (ref 3.5–5.1)
SODIUM: 140 mmol/L (ref 135–145)

## 2015-10-18 LAB — EXPECTORATED SPUTUM ASSESSMENT W GRAM STAIN, RFLX TO RESP C

## 2015-10-18 LAB — GLUCOSE, CAPILLARY: Glucose-Capillary: 128 mg/dL — ABNORMAL HIGH (ref 65–99)

## 2015-10-18 LAB — CBC
HEMATOCRIT: 37.2 % (ref 36.0–46.0)
HEMOGLOBIN: 11.4 g/dL — AB (ref 12.0–15.0)
MCH: 29 pg (ref 26.0–34.0)
MCHC: 30.6 g/dL (ref 30.0–36.0)
MCV: 94.7 fL (ref 78.0–100.0)
Platelets: 183 10*3/uL (ref 150–400)
RBC: 3.93 MIL/uL (ref 3.87–5.11)
RDW: 13.4 % (ref 11.5–15.5)
WBC: 8.7 10*3/uL (ref 4.0–10.5)

## 2015-10-18 LAB — EXPECTORATED SPUTUM ASSESSMENT W REFEX TO RESP CULTURE

## 2015-10-18 MED ORDER — PERFLUTREN LIPID MICROSPHERE
INTRAVENOUS | Status: AC
  Administered 2015-10-18: 2 mL
  Filled 2015-10-18: qty 10

## 2015-10-18 MED ORDER — LEVOFLOXACIN 750 MG PO TABS
750.0000 mg | ORAL_TABLET | Freq: Every day | ORAL | Status: DC
  Administered 2015-10-18 – 2015-10-20 (×3): 750 mg via ORAL
  Filled 2015-10-18 (×3): qty 1

## 2015-10-18 MED ORDER — HYDRALAZINE HCL 20 MG/ML IJ SOLN
5.0000 mg | Freq: Four times a day (QID) | INTRAMUSCULAR | Status: DC | PRN
  Administered 2015-10-18: 5 mg via INTRAVENOUS
  Filled 2015-10-18: qty 1

## 2015-10-18 NOTE — Progress Notes (Signed)
  Echocardiogram 2D Echocardiogram with Definity has been performed.  Tresa Res 10/18/2015, 11:45 AM

## 2015-10-18 NOTE — Progress Notes (Signed)
PROGRESS NOTE  Hailey Keith:[id number] DOB: 06/26/1935 DOA: 10/16/2015 PCP: Alesia Richards, MD   HPI: 81 y.o. female with a Past Medical History of COPD, stage II non-small cell lung cancer s/p radiation; who presented with complaints of shortness of breath. Initial ABG showing signs of acute hypoxemic and hypercapnic respiratory failure.   Patient was admitted on 2/12 with respiratory failure, initially requiring BiPAP, improving with diuresis.  Subjective / 24 H Interval events - on BiPAP this morning, somewhat confused  Assessment/Plan: Principal Problem:   Acute respiratory failure with hypoxia (HCC) Active Problems:   Mixed hyperlipidemia   Essential hypertension   COPD mixed type (HCC)   Prediabetes   Acute on chronic respiratory failure with hypercapnia (HCC)   DNR (do not resuscitate) discussion   Thoracic compression fracture ([place])   Acute exacerbation of congestive heart failure (HCC)   Acute on Chronic Respiratory Failure with Hypoxia and Hypercapnia with acute COPD exacerbation - This is improving with diuresis, 2-D echo is pending, continue steroids, nebulizers as well as levofloxacin - continue IV steroids, antibiotics, nebulizers - Degree to monitor his head down today  Acute exacerbation congestive heart failure  - BNP elevated at 327. With signs of pulmonary congestion on chest x-ray. - 2D echo pending, continue IV diuresis, monitor BMP, slight increasing BUN and creatinine normal - slight troponin elevation, flat, likely demand  Hypertension - Stable, blood pressure this morning 138/74 - Continue home anti-hypertensive medications.   Leukocytosis - Likely is reactive, in the setting of home steroids.   History of Lung Cancer, on observation - CT angio shows stable disease   Diet: Diet Carb Modified Fluid consistency:: Thin; Room service appropriate?: Yes Fluids: none  DVT Prophylaxis: Lovenox  Code Status: DNR Family  Communication: d/w daughter   Disposition Plan: remain in SDU  Barriers to discharge: respiratory failure  Consultants:  None   Procedures:  None    Antibiotics Levofloxacin 2/12 >>   Studies  Ct Angio Chest Pe W/cm &/or Wo Cm  10/16/2015  CLINICAL DATA:  Respiratory distress, hypoxia. History of lung cancer status post radiation. EXAM: CT ANGIOGRAPHY CHEST WITH CONTRAST TECHNIQUE: Multidetector CT imaging of the chest was performed using the standard protocol during bolus administration of intravenous contrast. Multiplanar CT image reconstructions and MIPs were obtained to evaluate the vascular anatomy. CONTRAST:  34m OMNIPAQUE IOHEXOL 350 MG/ML SOLN [facility] FINDINGS: No evidence of pulmonary embolism. Mediastinum/Nodes: Cardiomegaly. Coronary atherosclerosis. Atherosclerotic calcifications of the aortic arch. Mild thoracic lymphadenopathy, including: --11 mm short axis prevascular node (series 601/image 28) --11 mm short axis right paratracheal node (series 601/ image 34) --12 mm short axis subcarinal node (series 601/ image 39) --10 mm short axis left hilar node (series 601/image 40) Visualized thyroid is unremarkable. Lungs/Pleura: No interval change from recent CT. Stable volume loss in the right middle lobe. Stable radiation changes/ scarring in the left upper lobe. Mild dependent atelectasis in the bilateral lower lobes. Underlying moderate centrilobular and paraseptal emphysematous changes. Superimposed interstitial lung disease is suspected at the lung bases. Scattered small pulmonary nodules measuring up to 5 mm, described on CT report dated 10/14/2015, less conspicuous on the current motion degraded exam. No pleural effusion or pneumothorax. Upper abdomen: Visualized upper abdomen is notable for vascular calcifications and vicarious excretion of contrast in the gallbladder. Musculoskeletal: Degenerative changes of the visualized  thoracolumbar spine. Moderate loss of height at T4 with prior vertebral augmentation. Prior vertebral augmentation at  T3. Review of the MIP images confirms the above findings. IMPRESSION: No evidence of pulmonary embolism. Otherwise unchanged from CT chest dated 10/14/2015. Electronically Signed   By: Julian Hy M.D.   On: 10/16/2015 15:18   Dg Chest Port 1 View  10/16/2015  CLINICAL DATA:  Shortness of breath, respiratory distress. Home O2 sats at 60%. History of lung cancer in 2014, COPD, hypertension. EXAM: PORTABLE CHEST 1 VIEW COMPARISON:  Chest x-ray dated 05/04/2015. Comparison also to a chest CT dated 10/14/2015. FINDINGS: Mild cardiomegaly is stable. Overall cardiomediastinal silhouette is stable in size and configuration. Again noted is a mild prominence of the central perihilar bronchovascular markings, not significantly changed compared to multiple prior studies, perhaps related to the chronic mild volume overload or chronic bronchitis. The patchy bilateral interstitial and alveolar airspace opacities are unchanged compared to multiple prior studies, again likely chronic in nature and perhaps related to the question of chronic interstitial lung disease described on recent chest CT. No new lung findings seen. No new confluent opacity to suggest a developing pneumonia. No pleural effusion. No pneumothorax seen. Osseous structures about the chest are unremarkable. IMPRESSION: 1. No acute findings.  No evidence of pneumonia. 2. Probable chronic mild volume overload/CHF or chronic bronchitic changes. Also chronic interstitial thickening which is likely related to the recent CT report questioning a chronic interstitial lung disease. Electronically Signed   By: Franki Cabot M.D.   On: 10/16/2015 13:26    Objective  Filed Vitals:   10/18/15 0400 10/18/15 0500 10/18/15 0621 10/18/15 0800  BP:   177/80 138/74  Pulse:    69  Temp: 98.2 F (36.8 C)   98.2 F (36.8 C)  TempSrc: Axillary       Resp:    23  Height:      Weight:  96.7 kg (213 lb 3 oz)    SpO2:    92%    Intake/Output Summary (Last 24 hours) at 10/18/15 1117 Last data filed at 10/18/15 1000  Gross per 24 hour  Intake    510 ml  Output   1375 ml  Net   -865 ml   Filed Weights   10/16/15 1910 10/17/15 0427 10/18/15 0500  Weight: 99.6 kg (219 lb 9.3 oz) 97.6 kg (215 lb 2.7 oz) 96.7 kg (213 lb 3 oz)    Exam:  GENERAL: On nasal cannula, appears comfortable, just woke up  HEENT: no scleral icterus  NECK: supple  LUNGS: slight wheezing, coarse breath sounds, overall decreased  HEART: RRR without MRG  ABDOMEN: soft, non tender  NEUROLOGIC: non focal  SKIN: no rashes  Data Reviewed: Basic Metabolic Panel:  Recent Labs Lab 10/14/15 1004 10/16/15 1303 10/17/15 0215 10/18/15 0313  NA 140 138 140 140  K 4.4 4.8 4.7 4.1  CL  --  96* 98* 94*  CO2 32* 31 31 35*  GLUCOSE 174* 142* 166* 136*  BUN 12.8 21* 19 29*  CREATININE 0.8 0.90 0.88 0.84  CALCIUM 9.3 9.2 8.8* 8.9   Liver Function Tests:  Recent Labs Lab 10/14/15 1004 10/16/15 1303 10/17/15 0215  AST '16 26 31  '$ ALT '16 16 19  '$ ALKPHOS 79 54 57  BILITOT 0.51 0.9 0.8  PROT 5.7* 5.1* 5.3*  ALBUMIN 3.3* 3.2* 2.9*   CBC:  Recent Labs Lab 10/14/15 1004 10/16/15 1303 10/18/15 0313  WBC 11.0* 12.0* 8.7  NEUTROABS 8.8* 9.3*  --   HGB 12.7 11.8* 11.4*  HCT 40.0 38.1 37.2  MCV 92.0 97.4  94.7  PLT 174 160 183   Cardiac Enzymes:  Recent Labs Lab 10/16/15 2012 10/17/15 0215 10/17/15 0905  TROPONINI 0.11* 0.08* 0.07*   BNP (last 3 results)  Recent Labs  10/16/15 1304  BNP 340.6*   CBG:  Recent Labs Lab 10/17/15 0740 10/18/15 0733  GLUCAP 116* 128*    Recent Results (from the past 240 hour(s))  Blood culture (routine x 2)     Status: None (Preliminary result)   Collection Time: 10/16/15  1:35 PM  Result Value Ref Range Status   Specimen Description BLOOD RIGHT ANTECUBITAL  Final   Special Requests BOTTLES DRAWN  AEROBIC AND ANAEROBIC 5CC  Final   Culture NO GROWTH 1 DAY  Final   Report Status PENDING  Incomplete  Blood culture (routine x 2)     Status: None (Preliminary result)   Collection Time: 10/16/15  1:41 PM  Result Value Ref Range Status   Specimen Description BLOOD LEFT ANTECUBITAL  Final   Special Requests BOTTLES DRAWN AEROBIC AND ANAEROBIC 5CC  Final   Culture NO GROWTH 1 DAY  Final   Report Status PENDING  Incomplete  MRSA PCR Screening     Status: None   Collection Time: 10/16/15  8:57 PM  Result Value Ref Range Status   MRSA by PCR NEGATIVE NEGATIVE Final    Comment:        The GeneXpert MRSA Assay (FDA approved for NASAL specimens only), is one component of a comprehensive MRSA colonization surveillance program. It is not intended to diagnose MRSA infection nor to guide or monitor treatment for MRSA infections.   Culture, sputum-assessment     Status: None   Collection Time: 10/17/15  4:27 PM  Result Value Ref Range Status   Specimen Description SPUTUM  Final   Special Requests NONE  Final   Sputum evaluation   Final    THIS SPECIMEN IS ACCEPTABLE. RESPIRATORY CULTURE REPORT TO FOLLOW.   Report Status 10/18/2015 FINAL  Final     Scheduled Meds: . arformoterol  15 mcg Nebulization BID  . aspirin EC  81 mg Oral Daily  . atorvastatin  80 mg Oral q1800  . budesonide (PULMICORT) nebulizer solution  0.5 mg Nebulization BID  . chlorhexidine  15 mL Mouth Rinse BID  . citalopram  40 mg Oral Daily  . doxazosin  8 mg Oral QHS  . enoxaparin (LOVENOX) injection  40 mg Subcutaneous Q24H  . furosemide  40 mg Intravenous Q12H  . guaiFENesin  600 mg Oral BID  . levofloxacin  750 mg Oral QHS  . losartan  100 mg Oral Daily  . methylPREDNISolone (SOLU-MEDROL) injection  60 mg Intravenous Q6H  . sodium chloride flush  3 mL Intravenous Q12H   Continuous Infusions:    Marzetta Board, MD [facility] Pager [phone]. If 7 PM - 7 AM, please contact night-coverage at  [email or web address], password [facility] 10/18/2015, 11:17 AM  LOS: 1 day

## 2015-10-18 NOTE — Progress Notes (Signed)
RT NOTE:  Pt is currently off BIPAP. Respiratory effort is normal, no SOB, WOB normal. Currently on 2L [place] (Sats 97%.) No need for BIPAP during last 48hrs. RT will monitor.

## 2015-10-18 NOTE — Evaluation (Signed)
Physical Therapy Evaluation Patient Details Name: Hailey Keith MRN: [id number] DOB: September 30, 1934 Today's Date: 10/18/2015   History of Present Illness  81 y.o. female with a Past Medical History of COPD, stage II non-small cell lung cancer s/p radiation; who presented with complaints of shortness of breath. Initial ABG showing signs of acute hypoxemic and hypercapnic respiratory failure.   Pt was on Bipap initially this admit.  Now on [facility].    Clinical Impression  Pt admitted with above diagnosis. Pt currently with functional limitations due to the deficits listed below (see PT Problem List). Pt eval very limited due to on 3N1 due to Lasix.  Pt was able to stand at 3N1 with fair strength but is weak.  Daughter provides all caree and will continue at d/c.  Pt desat to 83% on 2LO2 with minimal activity of standing.  Will benefit from PT here in hospital and most likely at home.  Will follow acutely.   Pt will benefit from skilled PT to increase their independence and safety with mobility to allow discharge to the venue listed below.      Follow Up Recommendations Home health PT;Supervision/Assistance - 24 hour    Equipment Recommendations  None recommended by PT    Recommendations for Other Services       Precautions / Restrictions Precautions Precautions: Fall Restrictions Weight Bearing Restrictions: No      Mobility  Bed Mobility               General bed mobility comments: NT - pt on 3N1 on arrival.  Pt had Lasix and has been on potty chair all am per daughter and nurse.  Transfers Overall transfer level: Needs assistance Equipment used: None Transfers: Sit to/from Stand Sit to Stand: Min guard         General transfer comment: Pt needed steadying assist to stand.  Pt having incontinence due to LAsix.    Ambulation/Gait                Stairs            Wheelchair Mobility    Modified Rankin (Stroke Patients Only)       Balance Overall  balance assessment: Needs assistance         Standing balance support: No upper extremity supported;During functional activity Standing balance-Leahy Scale: Poor Standing balance comment: requires steadying asssit statically.                               Pertinent Vitals/Pain Pain Assessment: 0-10 Pain Score: 5  Pain Location: back Pain Descriptors / Indicators: Aching;Grimacing;Guarding Pain Intervention(s): Repositioned;Monitored during session;Patient requesting pain meds-RN notified;Limited activity within patient's tolerance (RN was gettting ready to get meds)  92% on 2LO2 at rest.  O2 dropped to 83% on 2LO2 when pt standing at 3N1.  Took almost 4 minutes for O2 to return to 92%.  Poor reserve.      Home Living Family/patient expects to be discharged to:: Private residence Living Arrangements: Children Available Help at Discharge: Family;Available 24 hours/day Type of Home: House Home Access: Level entry     Home Layout: One level Home Equipment: Walker - 2 wheels;Hand held shower head;Bedside commode;Transport chair;Tub bench;Cane - single point;Shower seat (2Lhome O2) Additional Comments: daughter states she hasn't fallen since April when she had a freak accident falling off barstool.  She now has 24 hour care.     Prior Function Level  of Independence: Independent with assistive device(s);Needs assistance   Gait / Transfers Assistance Needed: daughter states pt used device outdoors only  ADL's / Homemaking Assistance Needed: daughter assist min to mod asssit with bathing and supervision for dresssing.          Hand Dominance   Dominant Hand: Right    Extremity/Trunk Assessment   Upper Extremity Assessment: Defer to OT evaluation           Lower Extremity Assessment: Generalized weakness      Cervical / Trunk Assessment: Kyphotic  Communication   Communication: HOH  Cognition Arousal/Alertness: Awake/alert Behavior During Therapy: WFL for  tasks assessed/performed Overall Cognitive Status: Within Functional Limits for tasks assessed                      General Comments      Exercises General Exercises - Lower Extremity Ankle Circles/Pumps: AROM;Both;5 reps;Seated Long Arc Quad: AROM;Both;10 reps;Seated Hip Flexion/Marching: AROM;Both;10 reps;Seated      Assessment/Plan    PT Assessment Patient needs continued PT services  PT Diagnosis Generalized weakness;Acute pain   PT Problem List Decreased activity tolerance;Decreased balance;Decreased mobility;Decreased knowledge of use of DME;Decreased safety awareness;Decreased knowledge of precautions;Pain  PT Treatment Interventions DME instruction;Gait training;Functional mobility training;Therapeutic activities;Therapeutic exercise;Balance training;Patient/family education   PT Goals (Current goals can be found in the Care Plan section) Acute Rehab PT Goals Patient Stated Goal: to go home PT Goal Formulation: With patient Time For Goal Achievement: 11/01/15 Potential to Achieve Goals: Good    Frequency Min 3X/week   Barriers to discharge        Co-evaluation               End of Session Equipment Utilized During Treatment: Gait belt;Oxygen Activity Tolerance: Patient limited by fatigue;Patient limited by pain Patient left: in chair;with call bell/phone within reach;with family/visitor present Nurse Communication: Mobility status;Patient requests pain meds         Time: 3299-2426 PT Time Calculation (min) (ACUTE ONLY): 17 min   Charges:   PT Evaluation $PT Eval Moderate Complexity: 1 Procedure     PT G CodesDenice Paradise 11-17-15, 12:18 PM Adalind Weitz,PT Acute Rehabilitation [phone] (pager)

## 2015-10-19 ENCOUNTER — Ambulatory Visit: Payer: Medicare Other | Admitting: Internal Medicine

## 2015-10-19 DIAGNOSIS — I5033 Acute on chronic diastolic (congestive) heart failure: Secondary | ICD-10-CM

## 2015-10-19 DIAGNOSIS — J441 Chronic obstructive pulmonary disease with (acute) exacerbation: Principal | ICD-10-CM

## 2015-10-19 LAB — BASIC METABOLIC PANEL
Anion gap: 12 (ref 5–15)
BUN: 33 mg/dL — AB (ref 6–20)
CALCIUM: 9.3 mg/dL (ref 8.9–10.3)
CO2: 35 mmol/L — ABNORMAL HIGH (ref 22–32)
Chloride: 92 mmol/L — ABNORMAL LOW (ref 101–111)
Creatinine, Ser: 1.09 mg/dL — ABNORMAL HIGH (ref 0.44–1.00)
GFR calc Af Amer: 55 mL/min — ABNORMAL LOW (ref >60)
GFR, EST NON AFRICAN AMERICAN: 47 mL/min — AB (ref >60)
Glucose, Bld: 170 mg/dL — ABNORMAL HIGH (ref 65–99)
POTASSIUM: 4.4 mmol/L (ref 3.5–5.1)
SODIUM: 139 mmol/L (ref 135–145)

## 2015-10-19 LAB — GLUCOSE, CAPILLARY: GLUCOSE-CAPILLARY: 152 mg/dL — AB (ref 65–99)

## 2015-10-19 MED ORDER — SENNA 8.6 MG PO TABS
1.0000 | ORAL_TABLET | Freq: Every day | ORAL | Status: DC
  Administered 2015-10-19 – 2015-10-21 (×3): 8.6 mg via ORAL
  Filled 2015-10-19 (×3): qty 1

## 2015-10-19 MED ORDER — DOCUSATE SODIUM 100 MG PO CAPS
100.0000 mg | ORAL_CAPSULE | Freq: Two times a day (BID) | ORAL | Status: DC
  Administered 2015-10-19 – 2015-10-21 (×5): 100 mg via ORAL
  Filled 2015-10-19 (×5): qty 1

## 2015-10-19 MED ORDER — HYDROCODONE-ACETAMINOPHEN 7.5-325 MG/15ML PO SOLN
ORAL | Status: AC
  Filled 2015-10-19: qty 15

## 2015-10-19 MED ORDER — METHYLPREDNISOLONE SODIUM SUCC 125 MG IJ SOLR
60.0000 mg | Freq: Two times a day (BID) | INTRAMUSCULAR | Status: DC
  Administered 2015-10-19 – 2015-10-20 (×3): 60 mg via INTRAVENOUS
  Filled 2015-10-19 (×3): qty 2

## 2015-10-19 MED ORDER — FUROSEMIDE 40 MG PO TABS
40.0000 mg | ORAL_TABLET | Freq: Every day | ORAL | Status: DC
  Administered 2015-10-20 – 2015-10-21 (×2): 40 mg via ORAL
  Filled 2015-10-19 (×2): qty 1

## 2015-10-19 NOTE — Progress Notes (Signed)
Called to give report, was told [facility] had not approved the bed yet after 107 minutes but would check on it. Will call back.

## 2015-10-19 NOTE — Progress Notes (Signed)
Attempted report 

## 2015-10-19 NOTE — Progress Notes (Addendum)
Physical Therapy Treatment Patient Details Name: Hailey Keith MRN: [id number] DOB: 1934-10-03 Today's Date: 10/19/2015    History of Present Illness 81 y.o. female with a Past Medical History of COPD, stage II non-small cell lung cancer s/p radiation; who presented with complaints of shortness of breath. Initial ABG showing signs of acute hypoxemic and hypercapnic respiratory failure.   Pt was on Bipap initially this admit.  Now on [facility].      PT Comments    Pt admitted with above diagnosis. Pt currently with functional limitations due to the deficits listed below (see PT Problem List). Pt was able to perform exercises but in too much pain to perform any other therapy.  Still having urinary frequency as well.  Pt sats better with exercise today.  Will continue and progress as able.   Pt will benefit from skilled PT to increase their independence and safety with mobility to allow discharge to the venue listed below.    Follow Up Recommendations  Home health PT;Supervision/Assistance - 24 hour     Equipment Recommendations  None recommended by PT    Recommendations for Other Services       Precautions / Restrictions Precautions Precautions: Fall Restrictions Weight Bearing Restrictions: No    Mobility  Bed Mobility               General bed mobility comments: Pt in chair.  Daughter had just cleaned pt as she wet on herself.  PT changed pts gown and socks.    Transfers                    Ambulation/Gait                 Stairs            Wheelchair Mobility    Modified Rankin (Stroke Patients Only)       Balance                                    Cognition Arousal/Alertness: Awake/alert Behavior During Therapy: WFL for tasks assessed/performed Overall Cognitive Status: Within Functional Limits for tasks assessed                      Exercises General Exercises - Upper Extremity Shoulder Flexion: AROM;Both;5  reps;Seated Elbow Flexion: AROM;Both;5 reps;Seated General Exercises - Lower Extremity Ankle Circles/Pumps: AROM;Both;5 reps;Seated Long Arc Quad: AROM;Both;10 reps;Seated Hip Flexion/Marching: AROM;Both;10 reps;Seated    General Comments General comments (skin integrity, edema, etc.): Pt c/o back pain and did not want to get up until she could get pain med.  Called nurseing who was to bring pain meds.  Pt performed exercises in chair.        Pertinent Vitals/Pain Pain Assessment: 0-10 Pain Score: 8  Pain Location: back Pain Descriptors / Indicators: Aching;Guarding;Grimacing Pain Intervention(s): Limited activity within patient's tolerance;Monitored during session;Premedicated before session;Repositioned;Patient requesting pain meds-RN notified  Sats 90-92% on 3LO2    Home Living                      Prior Function            PT Goals (current goals can now be found in the care plan section) Progress towards PT goals: Progressing toward goals    Frequency  Min 3X/week    PT Plan Current plan remains appropriate  Co-evaluation             End of Session Equipment Utilized During Treatment: Gait belt;Oxygen Activity Tolerance: Patient limited by fatigue;Patient limited by pain Patient left: in chair;with call bell/phone within reach;with family/visitor present     Time: 4469-5072 PT Time Calculation (min) (ACUTE ONLY): 17 min  Charges:  $Therapeutic Exercise: 8-22 mins                    G CodesIrwin Brakeman F 2015-10-20, 2:39 PM [employer] Acute Rehabilitation [phone] (pager)

## 2015-10-19 NOTE — Care Management Obs Status (Signed)
[place] NOTIFICATION   Patient Details  Name: Hailey Keith MRN: [id number] Date of Birth: [birth year]   Medicare Observation Status Notification Given:  Yes    Sharin Mons, RN 10/19/2015, 3:27 PM

## 2015-10-19 NOTE — Progress Notes (Signed)
PROGRESS NOTE  Hailey Keith:[id number] DOB: Aug 14, 1935 DOA: 10/16/2015 PCP: Alesia Richards, MD  HPI: 81 y.o. female with a Past Medical History of COPD, stage II non-small cell lung cancer s/p radiation; who presented with complaints of shortness of breath. Initial ABG showing signs of acute hypoxemic and hypercapnic respiratory failure.   Patient was admitted on 2/12 with respiratory failure, initially requiring BiPAP, improving with diuresis.  Subjective / 24 H Interval events - on BiPAP this morning, somewhat confused  Assessment/Plan:  Acute on Chronic Respiratory Failure with Hypoxia and Hypercapnia with acute COPD exacerbation - improving with diuresis -10/18/2015 echo EF 55-60%, grade 1 daily, moderate to severe RAE -continue steroids, nebulizers as well as levofloxacin - continue IV steroids, antibiotics, nebulizers -Continue Pulmicort, Brovana, Solu-Medrol -Wean Solu-Medrol  Acute on chronic diastolic congestive heart failure  - BNP elevated at 327. With signs of pulmonary congestion on chest x-ray. - continue IV diuresis, monitor BMP, slight increasing BUN and creatinine normal - slight troponin elevation, flat, likely demand -10/18/2015 echo EF 55-60%, grade 1 daily, moderate to severe RAE -plan to switch to po lasix am 2/16  Hypertension - Stable, blood pressure this morning 138/74 - Continue home anti-hypertensive medications--losartan, Cardura  Leukocytosis - Likely is reactive, in the setting of home steroids.   History of Lung Cancer, on observation - CT angio chest--negative PE, hilar and subcarinal lymphadenopathy, bibasilar atelectasis, moderate centrilobular emphysema -follows Dr. Julien Nordmann  Constipation  -start carthartics  Diet: Diet Carb Modified Fluid consistency:: Thin;  Fluids: none  DVT Prophylaxis: Lovenox  Code Status: DNR Family Communication: d/w daughterat bedside 2/15  Disposition Plan: transfer to  tele Barriers to discharge: respiratory failure  Consultants:  None  Procedures:  None   Antibiotics Levofloxacin 2/12 >>    Procedures/Studies: Ct Chest W Contrast  10/14/2015  CLINICAL DATA:  Lung cancer diagnosed in 2014 with radiation therapy complete. Cough and shortness of breath. Restaging. EXAM: CT CHEST WITH CONTRAST TECHNIQUE: Multidetector CT imaging of the chest was performed during intravenous contrast administration. CONTRAST:  8m OMNIPAQUE IOHEXOL 300 MG/ML  SOLN COMPARISON:  03/23/2015. FINDINGS: Mediastinum/Nodes: No supraclavicular adenopathy. Advanced aortic and branch vessel atherosclerosis. Mild cardiomegaly with a new small pericardial effusion. Multivessel coronary artery atherosclerosis. No central pulmonary embolism, on this non-dedicated study. No mediastinal or hilar adenopathy. Lungs/Pleura: Similar trace right pleural fluid. Advanced centrilobular emphysema. Probable secretions in the non dependent trachea. Minimal motion degradation inferiorly. 4 mm right upper lobe pulmonary nodule on image 21/series 5 is unchanged. Soft tissue thickening along the right minor fissure measures maximally 1.8 x 1.2 cm on image 37/series 5. 1.8 x 1.7 cm on the prior. Favored to represent a volume loss rather than a true nodule. left upper lobe pulmonary nodule which measures 5 mm on image 25/ series 5, similar. Posterior left upper lobe irregular nodule measures maximally 8 mm on image 15/series 5, similar. Left lower lobe nodule measures 5 mm today versus 7 mm on the prior exam (when remeasured). Example image 29 today. Again identified is presumed radiation change in the left upper lobe with interstitial thickening primarily centrally. No well-defined residual nodule identified in this area. Bibasilar areas of interstitial prominence are subpleural in distribution and may represent areas of mild honeycombing. Similar back to 11/23/2014. Upper abdomen: Normal imaged portions of the  liver, spleen, stomach, pancreas, adrenal glands, kidneys. Abdominal aortic atherosclerosis. Musculoskeletal: Osteopenia. Status post vertebral augmentation at T4-T5. IMPRESSION: 1. Further response  to therapy. Presumed radiation changes in the right upper lobe without well-defined residual nodule in this area. Other pulmonary nodules are similar and slightly decreased in size. No progressive or highly suspicious nodule identified. 2. No thoracic adenopathy. 3. Similar trace right pleural fluid. 4.  Atherosclerosis, including within the coronary arteries. 5. New small pericardial effusion. 6. Suspicion of interstitial lung disease, as detailed on 11/23/2014. This may represent an unusual appearance of usual interstitial pneumonitis , with mild isolated honeycombing at the bases. Electronically Signed   By: Abigail Miyamoto M.D.   On: 10/14/2015 14:28   Ct Angio Chest Pe W/cm &/or Wo Cm  10/16/2015  CLINICAL DATA:  Respiratory distress, hypoxia. History of lung cancer status post radiation. EXAM: CT ANGIOGRAPHY CHEST WITH CONTRAST TECHNIQUE: Multidetector CT imaging of the chest was performed using the standard protocol during bolus administration of intravenous contrast. Multiplanar CT image reconstructions and MIPs were obtained to evaluate the vascular anatomy. CONTRAST:  86m OMNIPAQUE IOHEXOL 350 MG/ML [facility]N [facility] FINDINGS: No evidence of pulmonary embolism. Mediastinum/Nodes: Cardiomegaly. Coronary atherosclerosis. Atherosclerotic calcifications of the aortic arch. Mild thoracic lymphadenopathy, including: --11 mm short axis prevascular node (series 601/image 28) --11 mm short axis right paratracheal node (series 601/ image 34) --12 mm short axis subcarinal node (series 601/ image 39) --10 mm short axis left hilar node (series 601/image 40) Visualized thyroid is unremarkable. Lungs/Pleura: No interval change from recent CT. Stable volume loss in the right  middle lobe. Stable radiation changes/ scarring in the left upper lobe. Mild dependent atelectasis in the bilateral lower lobes. Underlying moderate centrilobular and paraseptal emphysematous changes. Superimposed interstitial lung disease is suspected at the lung bases. Scattered small pulmonary nodules measuring up to 5 mm, described on CT report dated 10/14/2015, less conspicuous on the current motion degraded exam. No pleural effusion or pneumothorax. Upper abdomen: Visualized upper abdomen is notable for vascular calcifications and vicarious excretion of contrast in the gallbladder. Musculoskeletal: Degenerative changes of the visualized thoracolumbar spine. Moderate loss of height at T4 with prior vertebral augmentation. Prior vertebral augmentation at T3. Review of the MIP images confirms the above findings. IMPRESSION: No evidence of pulmonary embolism. Otherwise unchanged from CT chest dated 10/14/2015. Electronically Signed   By: SJulian HyM.D.   On: 10/16/2015 15:18   Dg Chest Port 1 View  10/16/2015  CLINICAL DATA:  Shortness of breath, respiratory distress. Home O2 sats at 60%. History of lung cancer in 2014, COPD, hypertension. EXAM: PORTABLE CHEST 1 VIEW COMPARISON:  Chest x-ray dated 05/04/2015. Comparison also to a chest CT dated 10/14/2015. FINDINGS: Mild cardiomegaly is stable. Overall cardiomediastinal silhouette is stable in size and configuration. Again noted is a mild prominence of the central perihilar bronchovascular markings, not significantly changed compared to multiple prior studies, perhaps related to the chronic mild volume overload or chronic bronchitis. The patchy bilateral interstitial and alveolar airspace opacities are unchanged compared to multiple prior studies, again likely chronic in nature and perhaps related to the question of chronic interstitial lung disease described on recent chest CT. No new lung findings seen. No new confluent opacity to suggest a developing  pneumonia. No pleural effusion. No pneumothorax seen. Osseous structures about the chest are unremarkable. IMPRESSION: 1. No acute findings.  No evidence of pneumonia. 2. Probable chronic mild volume overload/CHF or chronic bronchitic changes. Also chronic interstitial thickening which is likely related to the recent CT report questioning a chronic interstitial lung disease. Electronically Signed  By: Franki Cabot M.D.   On: 10/16/2015 13:26         Subjective:  patient is breathing better but remained short of breath with exertion. Denies any fevers, chills, chest pain, nausea, vomiting, diarrhea. No abdominal pain or dysuria.   Objective: Filed Vitals:   10/19/15 0032 10/19/15 0446 10/19/15 0728 10/19/15 0744  BP: 136/68 139/74  158/77  Pulse:  74  60  Temp: 97.8 F (36.6 C) 97.5 F (36.4 C)  97.8 F (36.6 C)  TempSrc: Axillary Oral  Oral  Resp:  18  19  Height:      Weight:      SpO2:  93% 98% 95%    Intake/Output Summary (Last 24 hours) at 10/19/15 0925 Last data filed at 10/18/15 1800  Gross per 24 hour  Intake   1080 ml  Output   1500 ml  Net   -420 ml   Weight change:  Exam:   General:  Pt is alert, follows commands appropriately, not in acute distress  HEENT: No icterus, No thrush, No neck mass, [place]/AT  Cardiovascular: RRR, S1/S2, no rubs, no gallops  Respiratory: bibasilar crackles. No wheeze  Abdomen: Soft/+BS, non tender, non distended, no guarding  Extremities: 1 + LE edema, No lymphangitis, No petechiae, No rashes, no synovitis  Data Reviewed: Basic Metabolic Panel:  Recent Labs Lab 10/14/15 1004 10/16/15 1303 10/17/15 0215 10/18/15 0313 10/19/15 0500  NA 140 138 140 140 139  K 4.4 4.8 4.7 4.1 4.4  CL  --  96* 98* 94* 92*  CO2 32* 31 31 35* 35*  GLUCOSE 174* 142* 166* 136* 170*  BUN 12.8 21* 19 29* 33*  CREATININE 0.8 0.90 0.88 0.84 1.09*  CALCIUM 9.3 9.2 8.8* 8.9 9.3   Liver Function Tests:  Recent Labs Lab 10/14/15 1004  10/16/15 1303 10/17/15 0215  AST '16 26 31  '$ ALT '16 16 19  '$ ALKPHOS 79 54 57  BILITOT 0.51 0.9 0.8  PROT 5.7* 5.1* 5.3*  ALBUMIN 3.3* 3.2* 2.9*   No results for input(s): LIPASE, AMYLASE in the last 168 hours. No results for input(s): AMMONIA in the last 168 hours. CBC:  Recent Labs Lab 10/14/15 1004 10/16/15 1303 10/18/15 0313  WBC 11.0* 12.0* 8.7  NEUTROABS 8.8* 9.3*  --   HGB 12.7 11.8* 11.4*  HCT 40.0 38.1 37.2  MCV 92.0 97.4 94.7  PLT 174 160 183   Cardiac Enzymes:  Recent Labs Lab 10/16/15 2012 10/17/15 0215 10/17/15 0905  TROPONINI 0.11* 0.08* 0.07*   BNP: Invalid input(s): POCBNP CBG:  Recent Labs Lab 10/17/15 0740 10/18/15 0733 10/19/15 0743  GLUCAP 116* 128* 152*    Recent Results (from the past 240 hour(s))  Blood culture (routine x 2)     Status: None (Preliminary result)   Collection Time: 10/16/15  1:35 PM  Result Value Ref Range Status   Specimen Description BLOOD RIGHT ANTECUBITAL  Final   Special Requests BOTTLES DRAWN AEROBIC AND ANAEROBIC 5CC  Final   Culture NO GROWTH 2 DAYS  Final   Report Status PENDING  Incomplete  Blood culture (routine x 2)     Status: None (Preliminary result)   Collection Time: 10/16/15  1:41 PM  Result Value Ref Range Status   Specimen Description BLOOD LEFT ANTECUBITAL  Final   Special Requests BOTTLES DRAWN AEROBIC AND ANAEROBIC 5CC  Final   Culture NO GROWTH 2 DAYS  Final   Report Status PENDING  Incomplete  MRSA PCR Screening  Status: None   Collection Time: 10/16/15  8:57 PM  Result Value Ref Range Status   MRSA by PCR NEGATIVE NEGATIVE Final    Comment:        The GeneXpert MRSA Assay (FDA approved for NASAL specimens only), is one component of a comprehensive MRSA colonization surveillance program. It is not intended to diagnose MRSA infection nor to guide or monitor treatment for MRSA infections.   Culture, sputum-assessment     Status: None   Collection Time: 10/17/15  4:27 PM  Result  Value Ref Range Status   Specimen Description SPUTUM  Final   Special Requests NONE  Final   Sputum evaluation   Final    THIS SPECIMEN IS ACCEPTABLE. RESPIRATORY CULTURE REPORT TO FOLLOW.   Report Status 10/18/2015 FINAL  Final  Culture, respiratory (NON-Expectorated)     Status: None (Preliminary result)   Collection Time: 10/17/15  4:27 PM  Result Value Ref Range Status   Specimen Description EXPECTORATED SPUTUM  Final   Special Requests NONE  Final   Gram Stain   Final    ABUNDANT WBC PRESENT,BOTH PMN AND MONONUCLEAR FEW SQUAMOUS EPITHELIAL CELLS PRESENT MODERATE GRAM VARIABLE ROD FEW YEAST Performed at [facility]    Culture PENDING  Incomplete   Report Status PENDING  Incomplete     Scheduled Meds: . arformoterol  15 mcg Nebulization BID  . aspirin EC  81 mg Oral Daily  . atorvastatin  80 mg Oral q1800  . budesonide (PULMICORT) nebulizer solution  0.5 mg Nebulization BID  . chlorhexidine  15 mL Mouth Rinse BID  . citalopram  40 mg Oral Daily  . doxazosin  8 mg Oral QHS  . enoxaparin (LOVENOX) injection  40 mg Subcutaneous Q24H  . furosemide  40 mg Intravenous Q12H  . guaiFENesin  600 mg Oral BID  . levofloxacin  750 mg Oral QHS  . losartan  100 mg Oral Daily  . methylPREDNISolone (SOLU-MEDROL) injection  60 mg Intravenous Q6H  . sodium chloride flush  3 mL Intravenous Q12H   Continuous Infusions:    Sirinity Outland, DO  [facility] Pager [phone]  If 7PM-7AM, please contact night-coverage [email or web address] Password [email or web address] 10/19/2015, 9:25 AM   LOS: 2 days

## 2015-10-19 NOTE — Progress Notes (Signed)
Report given to [facility], RN on 5W. Will be taking care of patient in 5W11. Informed that the patient will be on telemetry up there. Patient will be transferred via wheelchair with belongings and with daughter. CCMD notified of transfer. Milford Cage, RN

## 2015-10-20 LAB — BASIC METABOLIC PANEL
Anion gap: 13 (ref 5–15)
BUN: 29 mg/dL — AB (ref 6–20)
CHLORIDE: 90 mmol/L — AB (ref 101–111)
CO2: 35 mmol/L — ABNORMAL HIGH (ref 22–32)
CREATININE: 0.96 mg/dL (ref 0.44–1.00)
Calcium: 9.1 mg/dL (ref 8.9–10.3)
GFR calc Af Amer: >60 mL/min (ref >60)
GFR calc non Af Amer: 55 mL/min — ABNORMAL LOW (ref >60)
GLUCOSE: 152 mg/dL — AB (ref 65–99)
POTASSIUM: 3.2 mmol/L — AB (ref 3.5–5.1)
Sodium: 138 mmol/L (ref 135–145)

## 2015-10-20 LAB — GLUCOSE, CAPILLARY: Glucose-Capillary: 131 mg/dL — ABNORMAL HIGH (ref 65–99)

## 2015-10-20 MED ORDER — PREDNISONE 50 MG PO TABS
50.0000 mg | ORAL_TABLET | Freq: Every day | ORAL | Status: DC
  Administered 2015-10-21: 50 mg via ORAL
  Filled 2015-10-20: qty 1

## 2015-10-20 MED ORDER — CYCLOBENZAPRINE HCL 10 MG PO TABS
10.0000 mg | ORAL_TABLET | Freq: Three times a day (TID) | ORAL | Status: DC | PRN
  Administered 2015-10-20 – 2015-10-21 (×2): 10 mg via ORAL
  Filled 2015-10-20 (×2): qty 1

## 2015-10-20 MED ORDER — POTASSIUM CHLORIDE CRYS ER 20 MEQ PO TBCR
40.0000 meq | EXTENDED_RELEASE_TABLET | Freq: Once | ORAL | Status: AC
  Administered 2015-10-20: 40 meq via ORAL
  Filled 2015-10-20: qty 2

## 2015-10-20 NOTE — Care Management Note (Signed)
Case Management Note  Patient Details  Name: Hailey Keith MRN: [id number] Date of Birth: 1935/04/18  Subjective/Objective:                 Spoke with patient and daughter at the bedside. Patient lives with daughter. Pt on 2L O2 through [facility]. Patient would like [place] for [facility] PT and RN. Patient and daughter consented to [place] for COPD, referral placed. DME and med assistance declined.   Action/Plan:  Referral to [facility] and [facility] placed.  Expected Discharge Date:                  Expected Discharge Plan:  [place]  In-House Referral:     Discharge planning Services  CM Consult  Post Acute Care Choice:  Home Health Choice offered to:  Patient, Adult Children  DME Arranged:    DME Agency:     HH Arranged:  RN, PT HH Agency:  [place]  Status of Service:  In process, will continue to follow  Medicare Important Message Given:  Yes Date Medicare IM Given:    Medicare IM give by:    Date Additional Medicare IM Given:    Additional Medicare Important Message give by:     If discussed at [place] of Stay Meetings, dates discussed:    Additional Comments:  Carles Collet, RN 10/20/2015, 1:07 PM

## 2015-10-20 NOTE — Care Management Important Message (Signed)
Important Message  Patient Details  Name: Hailey Keith MRN: [id number] Date of Birth: September 16, 1934   Medicare Important Message Given:  Yes    Nathen May 10/20/2015, 12:22 PM

## 2015-10-20 NOTE — Consult Note (Signed)
   [facility] CM Inpatient Consult   10/20/2015  [place] September 29, 1934 [id number] Referral received to assess for care management services.   Met with the patient and her daughter [name], regarding the benefits of [place] Management services.  Bryan Lemma that [place] Management is a covered benefit of insurance. Review information for [facility] Care Management and a folder was provided with contact information. Daughter states that the patient will have [place] and they did sign up for the COPD EMMI calls. Explained that [place] Management does not interfere with or replace any services arranged by the inpatient care management staff.  Daughter politely decline services with [facility] Care Management, at this time.  Brochure and contact information given and states she will call if she needs any extra assistance after [place] finishes.  For questions, please contact: Natividad Brood, RN BSN [facility] Liaison  [phone] business mobile phone Toll free office [phone]

## 2015-10-20 NOTE — Plan of Care (Signed)
Problem: Safety: Goal: Ability to remain free from injury will improve Outcome: Not Met (add Reason) Pt has chronic back pain

## 2015-10-20 NOTE — Progress Notes (Signed)
Physical Therapy Treatment Patient Details Name: Hailey Keith MRN: [id number] DOB: [birth year] Today's Date: 10/20/2015    History of Present Illness 81 y.o. female with a Past Medical History of COPD, stage II non-small cell lung cancer s/p radiation; who presented with complaints of shortness of breath. Initial ABG showing signs of acute hypoxemic and hypercapnic respiratory failure.   Pt was on Bipap initially this admit.  Now on [facility].      PT Comments    Pt performed stand pivot transfer from bed to chair with min assist +1.  Pt presents with increased back pain through thoracic region despite premedication for pain.  TX terminated secondary to pain.    Follow Up Recommendations  Home health PT;Supervision/Assistance - 24 hour     Equipment Recommendations  None recommended by PT    Recommendations for Other Services       Precautions / Restrictions Precautions Precautions: Fall Restrictions Weight Bearing Restrictions: No    Mobility  Bed Mobility Overal bed mobility: Needs Assistance Bed Mobility: Supine to Sit;Rolling           General bed mobility comments: Pt required cues for log rolling and advancing to seated position.  PTA provided cues for hand placement to improve ease.    Transfers Overall transfer level: Needs assistance Equipment used: Rolling walker (2 wheeled) Transfers: Sit to/from Stand Sit to Stand: Min guard;Mod assist         General transfer comment: required increased assist on 1st attempt secondary to pain.  PTA assisted pt to donn pull-up and pt required return to bed secondary to pain 84% on 2L required 3L for continued mobility.  Pt performed increased mobility on 3L and O2 sats remained 94%.  Pt performed steps to chair with min assist but unable to advance further secondary to pain.    Ambulation/Gait                 Stairs            Wheelchair Mobility    Modified Rankin (Stroke Patients Only)        Balance Overall balance assessment: Needs assistance Sitting-balance support: Bilateral upper extremity supported Sitting balance-Leahy Scale: Fair       Standing balance-Leahy Scale: Fair                      Cognition Arousal/Alertness: Awake/alert Behavior During Therapy: WFL for tasks assessed/performed Overall Cognitive Status: Within Functional Limits for tasks assessed                      Exercises      General Comments        Pertinent Vitals/Pain Pain Assessment: 0-10 Pain Score: 8  Pain Location: back thoracic region Pain Descriptors / Indicators: Aching;Grimacing;Guarding Pain Intervention(s): Limited activity within patient's tolerance;Premedicated before session;Repositioned    Home Living                      Prior Function            PT Goals (current goals can now be found in the care plan section) Acute Rehab PT Goals Patient Stated Goal: to go home PT Goal Formulation: With patient Potential to Achieve Goals: Good Progress towards PT goals: Progressing toward goals    Frequency  Min 3X/week    PT Plan Current plan remains appropriate    Co-evaluation  End of Session Equipment Utilized During Treatment: Gait belt;Oxygen Activity Tolerance: Patient limited by fatigue;Patient limited by pain Patient left: in chair;with call bell/phone within reach;with family/visitor present;with chair alarm set     Time: 5852-7782 PT Time Calculation (min) (ACUTE ONLY): 19 min  Charges:  $Therapeutic Activity: 8-22 mins                    G Codes:      Hailey Keith 15-Nov-2015, 10:45 AM  Hailey Keith, PTA pager [phone]

## 2015-10-20 NOTE — Progress Notes (Signed)
PROGRESS NOTE  Hailey Keith AJG:[id number] DOB: 15-Jan-1935 DOA: 10/16/2015 PCP: Alesia Richards, MD  HPI: 81 y.o. female with a Past Medical History of COPD, stage II non-small cell lung cancer s/p radiation; who presented with complaints of shortness of breath. Initial ABG showing signs of acute hypoxemic and hypercapnic respiratory failure.   Patient was admitted on 2/12 with respiratory failure, initially requiring BiPAP, improving with diuresis. The patient was also started on steroids. Her intravenous furosemide was transitioned oral furosemide after appropriate diuresis. Intravenous IV Medrol was transitioned to oral prednisone.    Assessment/Plan:  Acute on Chronic Respiratory Failure with Hypoxia and Hypercapnia with acute COPD exacerbation - improving with diuresis -10/18/2015 echo EF 55-60%, grade 1 daily, moderate to severe RAE -continue steroids, nebulizers as well as levofloxacin - continue IV steroids, antibiotics, nebulizers -Continue Pulmicort, Brovana, Solu-Medrol -Wean Solu-Medrol--> changed to oral prednisone 08/19/2016 -Continue levofloxacin day #4/5  Acute on chronic diastolic congestive heart failure  - BNP elevated at 327. With signs of pulmonary congestion on chest x-ray. - continue IV diuresis, monitor BMP, slight increasing BUN and creatinine normal - slight troponin elevation, flat, likely demand -10/18/2015 echo EF 55-60%, grade 1 daily, moderate to severe RAE -plan to switch to po lasix am 2/16  Hypertension - Stable, blood pressure this morning 138/74 - Continue home anti-hypertensive medications--losartan, Cardura  Leukocytosis - Likely is reactive, in the setting of home steroids.   History of Lung Cancer, on observation - CT angio chest--negative PE, hilar and subcarinal lymphadenopathy, bibasilar atelectasis, moderate centrilobular emphysema -follows Dr. Julien Nordmann  Constipation  -start  carthartics-->+BM  Hypokalemia -Replete -Check magnesium  Chronic low back pain -CT angiogram of the chest did not reveal any acute thoracolumbar compression fractures -Treat symptomatically -Increase Flexeril  Diet: Diet Carb Modified Fluid consistency:: Thin;   DVT Prophylaxis: Lovenox  Code Status: DNR Family Communication: d/w daughterat bedside 2/16  Disposition Plan: transfer to tele    Procedures/Studies: Ct Chest W Contrast  10/14/2015  CLINICAL DATA:  Lung cancer diagnosed in 2014 with radiation therapy complete. Cough and shortness of breath. Restaging. EXAM: CT CHEST WITH CONTRAST TECHNIQUE: Multidetector CT imaging of the chest was performed during intravenous contrast administration. CONTRAST:  78m OMNIPAQUE IOHEXOL 300 MG/ML  SOLN COMPARISON:  03/23/2015. FINDINGS: Mediastinum/Nodes: No supraclavicular adenopathy. Advanced aortic and branch vessel atherosclerosis. Mild cardiomegaly with a new small pericardial effusion. Multivessel coronary artery atherosclerosis. No central pulmonary embolism, on this non-dedicated study. No mediastinal or hilar adenopathy. Lungs/Pleura: Similar trace right pleural fluid. Advanced centrilobular emphysema. Probable secretions in the non dependent trachea. Minimal motion degradation inferiorly. 4 mm right upper lobe pulmonary nodule on image 21/series 5 is unchanged. Soft tissue thickening along the right minor fissure measures maximally 1.8 x 1.2 cm on image 37/series 5. 1.8 x 1.7 cm on the prior. Favored to represent a volume loss rather than a true nodule. left upper lobe pulmonary nodule which measures 5 mm on image 25/ series 5, similar. Posterior left upper lobe irregular nodule measures maximally 8 mm on image 15/series 5, similar. Left lower lobe nodule measures 5 mm today versus 7 mm on the prior exam (when remeasured). Example image 29 today. Again identified is presumed radiation change in the left upper lobe with interstitial  thickening primarily centrally. No well-defined residual nodule identified in this area. Bibasilar areas of interstitial prominence are subpleural in distribution and may represent areas of mild honeycombing. Similar back to 11/23/2014. Upper  abdomen: Normal imaged portions of the liver, spleen, stomach, pancreas, adrenal glands, kidneys. Abdominal aortic atherosclerosis. Musculoskeletal: Osteopenia. Status post vertebral augmentation at T4-T5. IMPRESSION: 1. Further response to therapy. Presumed radiation changes in the right upper lobe without well-defined residual nodule in this area. Other pulmonary nodules are similar and slightly decreased in size. No progressive or highly suspicious nodule identified. 2. No thoracic adenopathy. 3. Similar trace right pleural fluid. 4.  Atherosclerosis, including within the coronary arteries. 5. New small pericardial effusion. 6. Suspicion of interstitial lung disease, as detailed on 11/23/2014. This may represent an unusual appearance of usual interstitial pneumonitis , with mild isolated honeycombing at the bases. Electronically Signed   By: Abigail Miyamoto M.D.   On: 10/14/2015 14:28   Ct Angio Chest Pe W/cm &/or Wo Cm  10/16/2015  CLINICAL DATA:  Respiratory distress, hypoxia. History of lung cancer status post radiation. EXAM: CT ANGIOGRAPHY CHEST WITH CONTRAST TECHNIQUE: Multidetector CT imaging of the chest was performed using the standard protocol during bolus administration of intravenous contrast. Multiplanar CT image reconstructions and MIPs were obtained to evaluate the vascular anatomy. CONTRAST:  62m OMNIPAQUE IOHEXOL 350 MG/ML [facility] FINDINGS: No evidence of pulmonary embolism. Mediastinum/Nodes: Cardiomegaly. Coronary atherosclerosis. Atherosclerotic calcifications of the aortic arch. Mild thoracic lymphadenopathy, including: --11 mm short axis prevascular node (series 601/image 28) --11 mm short axis  right paratracheal node (series 601/ image 34) --12 mm short axis subcarinal node (series 601/ image 39) --10 mm short axis left hilar node (series 601/image 40) Visualized thyroid is unremarkable. Lungs/Pleura: No interval change from recent CT. Stable volume loss in the right middle lobe. Stable radiation changes/ scarring in the left upper lobe. Mild dependent atelectasis in the bilateral lower lobes. Underlying moderate centrilobular and paraseptal emphysematous changes. Superimposed interstitial lung disease is suspected at the lung bases. Scattered small pulmonary nodules measuring up to 5 mm, described on CT report dated 10/14/2015, less conspicuous on the current motion degraded exam. No pleural effusion or pneumothorax. Upper abdomen: Visualized upper abdomen is notable for vascular calcifications and vicarious excretion of contrast in the gallbladder. Musculoskeletal: Degenerative changes of the visualized thoracolumbar spine. Moderate loss of height at T4 with prior vertebral augmentation. Prior vertebral augmentation at T3. Review of the MIP images confirms the above findings. IMPRESSION: No evidence of pulmonary embolism. Otherwise unchanged from CT chest dated 10/14/2015. Electronically Signed   By: SJulian HyM.D.   On: 10/16/2015 15:18   Dg Chest Port 1 View  10/16/2015  CLINICAL DATA:  Shortness of breath, respiratory distress. Home O2 sats at 60%. History of lung cancer in 2014, COPD, hypertension. EXAM: PORTABLE CHEST 1 VIEW COMPARISON:  Chest x-ray dated 05/04/2015. Comparison also to a chest CT dated 10/14/2015. FINDINGS: Mild cardiomegaly is stable. Overall cardiomediastinal silhouette is stable in size and configuration. Again noted is a mild prominence of the central perihilar bronchovascular markings, not significantly changed compared to multiple prior studies, perhaps related to the chronic mild volume overload or chronic bronchitis. The patchy bilateral interstitial and alveolar  airspace opacities are unchanged compared to multiple prior studies, again likely chronic in nature and perhaps related to the question of chronic interstitial lung disease described on recent chest CT. No new lung findings seen. No new confluent opacity to suggest a developing pneumonia. No pleural effusion. No pneumothorax seen. Osseous structures about the chest are unremarkable. IMPRESSION: 1. No acute findings.  No evidence of pneumonia. 2. Probable chronic mild  volume overload/CHF or chronic bronchitic changes. Also chronic interstitial thickening which is likely related to the recent CT report questioning a chronic interstitial lung disease. Electronically Signed   By: Franki Cabot M.D.   On: 10/16/2015 13:26         Subjective: Patient complains of back pain but denies any dysuria, hematuria, fevers, chills, chest pain, nausea, vomiting, diarrhea. Breathing overall is improving.   Objective: Filed Vitals:   10/19/15 2150 10/20/15 0457 10/20/15 0910 10/20/15 1314  BP:  142/71 126/60 132/77  Pulse:  73  80  Temp:  97.5 F (36.4 C)  97.8 F (36.6 C)  TempSrc:  Oral  Oral  Resp:  18  18  Height:      Weight:      SpO2: 96% 95%  97%    Intake/Output Summary (Last 24 hours) at 10/20/15 1824 Last data filed at 10/20/15 1019  Gross per 24 hour  Intake    222 ml  Output    150 ml  Net     72 ml   Weight change:  Exam:   General:  Pt is alert, follows commands appropriately, not in acute distress  HEENT: No icterus, No thrush, No neck mass, Backus/AT  Cardiovascular: RRR, S1/S2, no rubs, no gallops  Respiratory: Diminished breath sounds at the bases without wheezing. Good air movement.  Abdomen: Soft/+BS, non tender, non distended, no guarding; no hepatosplenomegaly  Extremities: trace LE edema, No lymphangitis, No petechiae, No rashes, no synovitis; no cyanosis  Data Reviewed: Basic Metabolic Panel:  Recent Labs Lab 10/16/15 1303 10/17/15 0215 10/18/15 0313  10/19/15 0500 10/20/15 0640  NA 138 140 140 139 138  K 4.8 4.7 4.1 4.4 3.2*  CL 96* 98* 94* 92* 90*  CO2 31 31 35* 35* 35*  GLUCOSE 142* 166* 136* 170* 152*  BUN 21* 19 29* 33* 29*  CREATININE 0.90 0.88 0.84 1.09* 0.96  CALCIUM 9.2 8.8* 8.9 9.3 9.1   Liver Function Tests:  Recent Labs Lab 10/14/15 1004 10/16/15 1303 10/17/15 0215  AST '16 26 31  '$ ALT '16 16 19  '$ ALKPHOS 79 54 57  BILITOT 0.51 0.9 0.8  PROT 5.7* 5.1* 5.3*  ALBUMIN 3.3* 3.2* 2.9*   No results for input(s): LIPASE, AMYLASE in the last 168 hours. No results for input(s): AMMONIA in the last 168 hours. CBC:  Recent Labs Lab 10/14/15 1004 10/16/15 1303 10/18/15 0313  WBC 11.0* 12.0* 8.7  NEUTROABS 8.8* 9.3*  --   HGB 12.7 11.8* 11.4*  HCT 40.0 38.1 37.2  MCV 92.0 97.4 94.7  PLT 174 160 183   Cardiac Enzymes:  Recent Labs Lab 10/16/15 2012 10/17/15 0215 10/17/15 0905  TROPONINI 0.11* 0.08* 0.07*   BNP: Invalid input(s): POCBNP CBG:  Recent Labs Lab 10/17/15 0740 10/18/15 0733 10/19/15 0743 10/20/15 0742  GLUCAP 116* 128* 152* 131*    Recent Results (from the past 240 hour(s))  Blood culture (routine x 2)     Status: None (Preliminary result)   Collection Time: 10/16/15  1:35 PM  Result Value Ref Range Status   Specimen Description BLOOD RIGHT ANTECUBITAL  Final   Special Requests BOTTLES DRAWN AEROBIC AND ANAEROBIC 5CC  Final   Culture NO GROWTH 4 DAYS  Final   Report Status PENDING  Incomplete  Blood culture (routine x 2)     Status: None (Preliminary result)   Collection Time: 10/16/15  1:41 PM  Result Value Ref Range Status   Specimen Description BLOOD LEFT  ANTECUBITAL  Final   Special Requests BOTTLES DRAWN AEROBIC AND ANAEROBIC 5CC  Final   Culture NO GROWTH 4 DAYS  Final   Report Status PENDING  Incomplete  MRSA PCR Screening     Status: None   Collection Time: 10/16/15  8:57 PM  Result Value Ref Range Status   MRSA by PCR NEGATIVE NEGATIVE Final    Comment:        The  GeneXpert MRSA Assay (FDA approved for NASAL specimens only), is one component of a comprehensive MRSA colonization surveillance program. It is not intended to diagnose MRSA infection nor to guide or monitor treatment for MRSA infections.   Culture, sputum-assessment     Status: None   Collection Time: 10/17/15  4:27 PM  Result Value Ref Range Status   Specimen Description SPUTUM  Final   Special Requests NONE  Final   Sputum evaluation   Final    THIS SPECIMEN IS ACCEPTABLE. RESPIRATORY CULTURE REPORT TO FOLLOW.   Report Status 10/18/2015 FINAL  Final  Culture, respiratory (NON-Expectorated)     Status: None (Preliminary result)   Collection Time: 10/17/15  4:27 PM  Result Value Ref Range Status   Specimen Description EXPECTORATED SPUTUM  Final   Special Requests NONE  Final   Gram Stain   Final    ABUNDANT WBC PRESENT,BOTH PMN AND MONONUCLEAR FEW SQUAMOUS EPITHELIAL CELLS PRESENT MODERATE GRAM VARIABLE ROD FEW YEAST Performed at [facility]    Culture   Final    Culture reincubated for better growth Performed at [facility]    Report Status PENDING  Incomplete     Scheduled Meds: . arformoterol  15 mcg Nebulization BID  . aspirin EC  81 mg Oral Daily  . atorvastatin  80 mg Oral q1800  . budesonide (PULMICORT) nebulizer solution  0.5 mg Nebulization BID  . chlorhexidine  15 mL Mouth Rinse BID  . citalopram  40 mg Oral Daily  . docusate sodium  100 mg Oral BID  . doxazosin  8 mg Oral QHS  . enoxaparin (LOVENOX) injection  40 mg Subcutaneous Q24H  . furosemide  40 mg Oral Daily  . guaiFENesin  600 mg Oral BID  . levofloxacin  750 mg Oral QHS  . losartan  100 mg Oral Daily  . methylPREDNISolone (SOLU-MEDROL) injection  60 mg Intravenous Q12H  . senna  1 tablet Oral Daily  . sodium chloride flush  3 mL Intravenous Q12H   Continuous Infusions:    Alexius Hangartner, DO  [facility] Pager [phone]  If 7PM-7AM, please contact  night-coverage [email or web address] Password [email or web address] 10/20/2015, 6:24 PM   LOS: 3 days

## 2015-10-21 DIAGNOSIS — I5033 Acute on chronic diastolic (congestive) heart failure: Secondary | ICD-10-CM

## 2015-10-21 LAB — GLUCOSE, CAPILLARY: Glucose-Capillary: 90 mg/dL (ref 65–99)

## 2015-10-21 LAB — BASIC METABOLIC PANEL
ANION GAP: 10 (ref 5–15)
BUN: 25 mg/dL — AB (ref 6–20)
CHLORIDE: 92 mmol/L — AB (ref 101–111)
CO2: 37 mmol/L — ABNORMAL HIGH (ref 22–32)
Calcium: 9.3 mg/dL (ref 8.9–10.3)
Creatinine, Ser: 1.01 mg/dL — ABNORMAL HIGH (ref 0.44–1.00)
GFR calc Af Amer: >60 mL/min (ref >60)
GFR calc non Af Amer: 52 mL/min — ABNORMAL LOW (ref >60)
Glucose, Bld: 119 mg/dL — ABNORMAL HIGH (ref 65–99)
POTASSIUM: 3.6 mmol/L (ref 3.5–5.1)
SODIUM: 139 mmol/L (ref 135–145)

## 2015-10-21 LAB — CULTURE, RESPIRATORY: CULTURE: NORMAL

## 2015-10-21 LAB — CULTURE, BLOOD (ROUTINE X 2)
CULTURE: NO GROWTH
Culture: NO GROWTH

## 2015-10-21 LAB — CULTURE, RESPIRATORY W GRAM STAIN

## 2015-10-21 LAB — MAGNESIUM: Magnesium: 2.2 mg/dL (ref 1.7–2.4)

## 2015-10-21 MED ORDER — LEVOFLOXACIN 500 MG PO TABS: 500.0000 mg | ORAL_TABLET | Freq: Every day | ORAL | Status: AC

## 2015-10-21 MED ORDER — POTASSIUM CHLORIDE CRYS ER 10 MEQ PO TBCR
10.0000 meq | EXTENDED_RELEASE_TABLET | Freq: Every day | ORAL | Status: DC
  Administered 2015-10-21: 10 meq via ORAL
  Filled 2015-10-21: qty 1

## 2015-10-21 MED ORDER — LEVOFLOXACIN 500 MG PO TABS: 750.0000 mg | ORAL_TABLET | Freq: Every day | ORAL | Status: DC

## 2015-10-21 MED ORDER — CITALOPRAM HYDROBROMIDE 20 MG PO TABS: 20.0000 mg | ORAL_TABLET | Freq: Every day | ORAL | Status: AC

## 2015-10-21 MED ORDER — POTASSIUM CHLORIDE CRYS ER 10 MEQ PO TBCR: 10.0000 meq | EXTENDED_RELEASE_TABLET | Freq: Every day | ORAL | Status: AC

## 2015-10-21 MED ORDER — CYCLOBENZAPRINE HCL 10 MG PO TABS: 10.0000 mg | ORAL_TABLET | Freq: Three times a day (TID) | ORAL | Status: DC | PRN

## 2015-10-21 MED ORDER — PREDNISONE 10 MG PO TABS: 50.0000 mg | ORAL_TABLET | Freq: Every day | ORAL | Status: AC

## 2015-10-21 MED ORDER — FUROSEMIDE 40 MG PO TABS: 40.0000 mg | ORAL_TABLET | Freq: Every day | ORAL | Status: DC

## 2015-10-21 NOTE — Progress Notes (Signed)
Patient discharge teaching given, including activity, diet, follow-up appoints, and medications. Patient verbalized understanding of all discharge instructions. IV access was d/c'd. Vitals are stable. Skin is intact except as charted in most recent assessments. Pt to be escorted out by NT, to be driven home by family. 

## 2015-10-21 NOTE — Discharge Summary (Addendum)
Physician Discharge Summary  Hailey Keith YDX:[id number] DOB: [birth year] DOA: 10/16/2015  PCP: Alesia Richards, MD  Admit date: 10/16/2015 Discharge date: 10/21/2015  Recommendations for Outpatient Follow-up:  1. Pt will need to follow up with PCP in 1- 2 weeks post discharge 2. Please obtain BMP in one week  Discharge Diagnoses:  Acute on Chronic Respiratory Failure with Hypoxia and Hypercapnia with acute COPD exacerbation - improving with diuresis -10/18/2015 echo EF 55-60%, grade 1 daily, moderate to severe RAE -continue steroids, nebulizers as well as levofloxacin - continue IV steroids, antibiotics, nebulizers -Continue Pulmicort, Brovana, Solu-Medrol during hospitalization -Wean Solu-Medrol--> home with prednisone taper until back to home dose of prednisone -Continue levofloxacin day #4/5--one more day after d/c  Acute on chronic diastolic congestive heart failure  - BNP elevated at 327. With signs of pulmonary congestion on chest x-ray. - continue IV diuresis, monitor BMP, slight increasing BUN and creatinine normal - slight troponin elevation, flat, likely demand -10/18/2015 echo EF 55-60%, grade 1 daily, moderate to severe RAE -plan to switch to po lasix am 2/16 -home with lasix 40 mg po daily and KCL 10 mEq daily -discharge weight 159 lbs  Hypertension - Stable, blood pressure this morning 138/74 - Continue home anti-hypertensive medications--losartan, Cardura  Leukocytosis - Likely is reactive, in the setting of steroids.  -remained afebrile and hemodynamically stable  History of Lung Cancer, on observation - CT angio chest--negative PE, hilar and subcarinal lymphadenopathy, bibasilar atelectasis, moderate centrilobular emphysema -follows Dr. Julien Nordmann  Constipation  -start carthartics-->+BM  Hypokalemia -Repleted -Check magnesium  Chronic low back pain -CT angiogram of the chest did not reveal any acute thoracolumbar compression  fractures -Treat symptomatically -Increase Flexeril to 10 mg tid prn pain -continue PT  Diet: Diet Carb Modified Fluid consistency:: Thin;   DVT Prophylaxis: Lovenox  Code Status: DNR   Discharge Condition: stable  Disposition: home Follow-up Information    Follow up with [facility].   Why:  Home Health PT and RN. They will call in 1-2 days after discharge to set up first home visit   Contact information:   [address] [place] [address] [phone]       Follow up with [facility].   Why:  will follow telephonically to assist with COPD      Diet:heart healthy Wt Readings from Last 3 Encounters:  10/21/15 72.349 kg (159 lb 8 oz)  10/05/15 76.204 kg (168 lb)  06/27/15 67.314 kg (148 lb 6.4 oz)    History of present illness:  81 y.o. female with a Past Medical History of COPD, stage II non-small cell lung cancer s/p radiation; who presented with complaints of shortness of breath. Initial ABG showing signs of acute hypoxemic and hypercapnic respiratory failure.   Patient was admitted on 2/12 with respiratory failure, initially requiring BiPAP, improving with diuresis. The patient was also started on steroids. Her intravenous furosemide was transitioned oral furosemide after appropriate diuresis. Intravenous IV soluMedrol was transitioned to oral prednisone.  She remained stable on oral therapy.  Discharge Exam: Filed Vitals:   10/20/15 2127 10/21/15 0551  BP: 130/58 137/82  Pulse: 91 83  Temp: 98.4 F (36.9 C) 97.6 F (36.4 C)  Resp: 17 18   Filed Vitals:   10/20/15 2127 10/21/15 0336 10/21/15 0551 10/21/15 0825  BP: 130/58  137/82   Pulse: 91  83   Temp: 98.4 F (36.9 C)  97.6 F (36.4 C)   TempSrc: Oral  Oral  Resp: 17  18   Height:      Weight:  72.349 kg (159 lb 8 oz)    SpO2: 94%  94% 96%   General: A&O x 3, NAD, pleasant, cooperative Cardiovascular: RRR, no rub, no gallop, no S3 Respiratory: diminished BS, no  wheeze Abdomen:soft, nontender, nondistended, positive bowel sounds Extremities: trace LE edema, No lymphangitis, no petechiae  Discharge Instructions      Discharge Instructions    Diet - low sodium heart healthy    Complete by:  As directed      Increase activity slowly    Complete by:  As directed             Medication List    STOP taking these medications        ibuprofen 200 MG tablet  Commonly known as:  ADVIL,MOTRIN      TAKE these medications        aspirin 81 MG tablet  Take 81 mg by mouth daily.     atorvastatin 80 MG tablet  Commonly known as:  LIPITOR  TAKE 1 TABLET EVERY DAY FOR CHOLESTEROL     citalopram 20 MG tablet  Commonly known as:  CELEXA  Take 1 tablet (20 mg total) by mouth daily.     cyclobenzaprine 10 MG tablet  Commonly known as:  FLEXERIL  Take 1 tablet (10 mg total) by mouth 3 (three) times daily as needed for muscle spasms.     doxazosin 8 MG tablet  Commonly known as:  CARDURA  TAKE 1 TABLET AT BEDTIME     furosemide 40 MG tablet  Commonly known as:  LASIX  Take 1 tablet (40 mg total) by mouth daily.     guaifenesin 400 MG Tabs tablet  Commonly known as:  HUMIBID E  Take 400 mg by mouth 2 (two) times daily.     HYDROcodone-acetaminophen 5-325 MG tablet  Commonly known as:  NORCO  Take 2 tablets by mouth every 4 (four) hours as needed.     ipratropium-albuterol 0.5-2.5 (3) MG/3ML Soln  Commonly known as:  DUONEB  TAKE 3 MLS BY NEBULIZATION 4 (FOUR) TIMES DAILY.     levofloxacin 500 MG tablet  Commonly known as:  LEVAQUIN  Take 1 tablet (500 mg total) by mouth at bedtime.     losartan 100 MG tablet  Commonly known as:  COZAAR  TAKE 1 TABLET BY MOUTH DAILY AS NEEDED FOR BLOOD PRESSURE     oxyCODONE-acetaminophen 5-325 MG tablet  Commonly known as:  PERCOCET/ROXICET  Take two tablets by mouth every four hours as needed for pain. Do not exceed 4gms of Tylenol in 24 hours     potassium chloride 10 MEQ tablet  Commonly  known as:  K-DUR,KLOR-CON  Take 1 tablet (10 mEq total) by mouth daily.     predniSONE 5 MG tablet  Commonly known as:  DELTASONE  Take 5 mg by mouth 2 (two) times daily with a meal.     predniSONE 10 MG tablet  Commonly known as:  DELTASONE  Take 5 tablets (50 mg total) by mouth daily with breakfast. And decrease by 1 tablet daily  Start taking on:  10/22/2015     PROAIR HFA 108 (90 Base) MCG/ACT inhaler  Generic drug:  albuterol  1 TO 2 INHALATIONS EVERY 4 HOURS AS NEEDED FOR RESCUE ASTHMA     albuterol (2.5 MG/3ML) 0.083% nebulizer solution  Commonly known as:  PROVENTIL  Take 3 mLs (2.5 mg  total) by nebulization every 4 (four) hours as needed for wheezing.     Vitamin D (Ergocalciferol) 50000 units Caps capsule  Commonly known as:  DRISDOL  TAKE ONE CAPSULE EVERY DAY         The results of significant diagnostics from this hospitalization (including imaging, microbiology, ancillary and laboratory) are listed below for reference.    Significant Diagnostic Studies: Ct Chest W Contrast  10/14/2015  CLINICAL DATA:  Lung cancer diagnosed in 2014 with radiation therapy complete. Cough and shortness of breath. Restaging. EXAM: CT CHEST WITH CONTRAST TECHNIQUE: Multidetector CT imaging of the chest was performed during intravenous contrast administration. CONTRAST:  48m OMNIPAQUE IOHEXOL 300 MG/ML  SOLN COMPARISON:  03/23/2015. FINDINGS: Mediastinum/Nodes: No supraclavicular adenopathy. Advanced aortic and branch vessel atherosclerosis. Mild cardiomegaly with a new small pericardial effusion. Multivessel coronary artery atherosclerosis. No central pulmonary embolism, on this non-dedicated study. No mediastinal or hilar adenopathy. Lungs/Pleura: Similar trace right pleural fluid. Advanced centrilobular emphysema. Probable secretions in the non dependent trachea. Minimal motion degradation inferiorly. 4 mm right upper lobe pulmonary nodule on image 21/series 5 is unchanged. Soft tissue  thickening along the right minor fissure measures maximally 1.8 x 1.2 cm on image 37/series 5. 1.8 x 1.7 cm on the prior. Favored to represent a volume loss rather than a true nodule. left upper lobe pulmonary nodule which measures 5 mm on image 25/ series 5, similar. Posterior left upper lobe irregular nodule measures maximally 8 mm on image 15/series 5, similar. Left lower lobe nodule measures 5 mm today versus 7 mm on the prior exam (when remeasured). Example image 29 today. Again identified is presumed radiation change in the left upper lobe with interstitial thickening primarily centrally. No well-defined residual nodule identified in this area. Bibasilar areas of interstitial prominence are subpleural in distribution and may represent areas of mild honeycombing. Similar back to 11/23/2014. Upper abdomen: Normal imaged portions of the liver, spleen, stomach, pancreas, adrenal glands, kidneys. Abdominal aortic atherosclerosis. Musculoskeletal: Osteopenia. Status post vertebral augmentation at T4-T5. IMPRESSION: 1. Further response to therapy. Presumed radiation changes in the right upper lobe without well-defined residual nodule in this area. Other pulmonary nodules are similar and slightly decreased in size. No progressive or highly suspicious nodule identified. 2. No thoracic adenopathy. 3. Similar trace right pleural fluid. 4.  Atherosclerosis, including within the coronary arteries. 5. New small pericardial effusion. 6. Suspicion of interstitial lung disease, as detailed on 11/23/2014. This may represent an unusual appearance of usual interstitial pneumonitis , with mild isolated honeycombing at the bases. Electronically Signed   By: KAbigail MiyamotoM.D.   On: 10/14/2015 14:28   Ct Angio Chest Pe W/cm &/or Wo Cm  10/16/2015  CLINICAL DATA:  Respiratory distress, hypoxia. History of lung cancer status post radiation. EXAM: CT ANGIOGRAPHY CHEST WITH CONTRAST TECHNIQUE: Multidetector CT imaging of the chest was  performed using the standard protocol during bolus administration of intravenous contrast. Multiplanar CT image reconstructions and MIPs were obtained to evaluate the vascular anatomy. CONTRAST:  665mOMNIPAQUE IOHEXOL 350 MG/ML [facility] FINDINGS: No evidence of pulmonary embolism. Mediastinum/Nodes: Cardiomegaly. Coronary atherosclerosis. Atherosclerotic calcifications of the aortic arch. Mild thoracic lymphadenopathy, including: --11 mm short axis prevascular node (series 601/image 28) --11 mm short axis right paratracheal node (series 601/ image 34) --12 mm short axis subcarinal node (series 601/ image 39) --10 mm short axis left hilar node (series 601/image 40) Visualized thyroid is unremarkable. Lungs/Pleura: No interval  change from recent CT. Stable volume loss in the right middle lobe. Stable radiation changes/ scarring in the left upper lobe. Mild dependent atelectasis in the bilateral lower lobes. Underlying moderate centrilobular and paraseptal emphysematous changes. Superimposed interstitial lung disease is suspected at the lung bases. Scattered small pulmonary nodules measuring up to 5 mm, described on CT report dated 10/14/2015, less conspicuous on the current motion degraded exam. No pleural effusion or pneumothorax. Upper abdomen: Visualized upper abdomen is notable for vascular calcifications and vicarious excretion of contrast in the gallbladder. Musculoskeletal: Degenerative changes of the visualized thoracolumbar spine. Moderate loss of height at T4 with prior vertebral augmentation. Prior vertebral augmentation at T3. Review of the MIP images confirms the above findings. IMPRESSION: No evidence of pulmonary embolism. Otherwise unchanged from CT chest dated 10/14/2015. Electronically Signed   By: Julian Hy M.D.   On: 10/16/2015 15:18   Dg Chest Port 1 View  10/16/2015  CLINICAL DATA:  Shortness of breath, respiratory distress. Home  O2 sats at 60%. History of lung cancer in 2014, COPD, hypertension. EXAM: PORTABLE CHEST 1 VIEW COMPARISON:  Chest x-ray dated 05/04/2015. Comparison also to a chest CT dated 10/14/2015. FINDINGS: Mild cardiomegaly is stable. Overall cardiomediastinal silhouette is stable in size and configuration. Again noted is a mild prominence of the central perihilar bronchovascular markings, not significantly changed compared to multiple prior studies, perhaps related to the chronic mild volume overload or chronic bronchitis. The patchy bilateral interstitial and alveolar airspace opacities are unchanged compared to multiple prior studies, again likely chronic in nature and perhaps related to the question of chronic interstitial lung disease described on recent chest CT. No new lung findings seen. No new confluent opacity to suggest a developing pneumonia. No pleural effusion. No pneumothorax seen. Osseous structures about the chest are unremarkable. IMPRESSION: 1. No acute findings.  No evidence of pneumonia. 2. Probable chronic mild volume overload/CHF or chronic bronchitic changes. Also chronic interstitial thickening which is likely related to the recent CT report questioning a chronic interstitial lung disease. Electronically Signed   By: Franki Cabot M.D.   On: 10/16/2015 13:26     Microbiology: Recent Results (from the past 240 hour(s))  Blood culture (routine x 2)     Status: None (Preliminary result)   Collection Time: 10/16/15  1:35 PM  Result Value Ref Range Status   Specimen Description BLOOD RIGHT ANTECUBITAL  Final   Special Requests BOTTLES DRAWN AEROBIC AND ANAEROBIC 5CC  Final   Culture NO GROWTH 4 DAYS  Final   Report Status PENDING  Incomplete  Blood culture (routine x 2)     Status: None (Preliminary result)   Collection Time: 10/16/15  1:41 PM  Result Value Ref Range Status   Specimen Description BLOOD LEFT ANTECUBITAL  Final   Special Requests BOTTLES DRAWN AEROBIC AND ANAEROBIC 5CC   Final   Culture NO GROWTH 4 DAYS  Final   Report Status PENDING  Incomplete  MRSA PCR Screening     Status: None   Collection Time: 10/16/15  8:57 PM  Result Value Ref Range Status   MRSA by PCR NEGATIVE NEGATIVE Final    Comment:        The GeneXpert MRSA Assay (FDA approved for NASAL specimens only), is one component of a comprehensive MRSA colonization surveillance program. It is not intended to diagnose MRSA infection nor to guide or monitor treatment for MRSA infections.   Culture, sputum-assessment     Status: None   Collection Time:  10/17/15  4:27 PM  Result Value Ref Range Status   Specimen Description SPUTUM  Final   Special Requests NONE  Final   Sputum evaluation   Final    THIS SPECIMEN IS ACCEPTABLE. RESPIRATORY CULTURE REPORT TO FOLLOW.   Report Status 10/18/2015 FINAL  Final  Culture, respiratory (NON-Expectorated)     Status: None   Collection Time: 10/17/15  4:27 PM  Result Value Ref Range Status   Specimen Description EXPECTORATED SPUTUM  Final   Special Requests NONE  Final   Gram Stain   Final    ABUNDANT WBC PRESENT,BOTH PMN AND MONONUCLEAR FEW SQUAMOUS EPITHELIAL CELLS PRESENT MODERATE GRAM VARIABLE ROD FEW YEAST Performed at [facility]    Culture   Final    NORMAL OROPHARYNGEAL FLORA Performed at [facility]    Report Status 10/21/2015 FINAL  Final     Labs: Basic Metabolic Panel:  Recent Labs Lab 10/17/15 0215 10/18/15 0313 10/19/15 0500 10/20/15 0640 10/21/15 0612  NA 140 140 139 138 139  K 4.7 4.1 4.4 3.2* 3.6  CL 98* 94* 92* 90* 92*  CO2 31 35* 35* 35* 37*  GLUCOSE 166* 136* 170* 152* 119*  BUN 19 29* 33* 29* 25*  CREATININE 0.88 0.84 1.09* 0.96 1.01*  CALCIUM 8.8* 8.9 9.3 9.1 9.3  MG  --   --   --   --  2.2   Liver Function Tests:  Recent Labs Lab 10/16/15 1303 10/17/15 0215  AST 26 31  ALT 16 19  ALKPHOS 54 57  BILITOT 0.9 0.8  PROT 5.1* 5.3*  ALBUMIN 3.2* 2.9*   No results for input(s):  LIPASE, AMYLASE in the last 168 hours. No results for input(s): AMMONIA in the last 168 hours. CBC:  Recent Labs Lab 10/16/15 1303 10/18/15 0313  WBC 12.0* 8.7  NEUTROABS 9.3*  --   HGB 11.8* 11.4*  HCT 38.1 37.2  MCV 97.4 94.7  PLT 160 183   Cardiac Enzymes:  Recent Labs Lab 10/16/15 2012 10/17/15 0215 10/17/15 0905  TROPONINI 0.11* 0.08* 0.07*   BNP: Invalid input(s): POCBNP CBG:  Recent Labs Lab 10/17/15 0740 10/18/15 0733 10/19/15 0743 10/20/15 0742 10/21/15 0747  GLUCAP 116* 128* 152* 131* 90    Time coordinating discharge:  Greater than 30 minutes  Signed:  Georgeanne Frankland, DO [facility] Pager: [phone] 10/21/2015, 1:04 PM

## 2015-10-22 DIAGNOSIS — S32028D Other fracture of second lumbar vertebra, subsequent encounter for fracture with routine healing: Secondary | ICD-10-CM | POA: Diagnosis not present

## 2015-10-22 DIAGNOSIS — J449 Chronic obstructive pulmonary disease, unspecified: Secondary | ICD-10-CM | POA: Diagnosis not present

## 2015-10-23 ENCOUNTER — Emergency Department (HOSPITAL_COMMUNITY): Payer: Medicare Other

## 2015-10-23 ENCOUNTER — Inpatient Hospital Stay (HOSPITAL_COMMUNITY)
Admission: EM | Admit: 2015-10-23 | Discharge: 2015-11-02 | DRG: 189 | Disposition: E | Payer: Medicare Other | Attending: Internal Medicine | Admitting: Internal Medicine

## 2015-10-23 ENCOUNTER — Encounter (HOSPITAL_COMMUNITY): Payer: Self-pay | Admitting: *Deleted

## 2015-10-23 DIAGNOSIS — G9341 Metabolic encephalopathy: Secondary | ICD-10-CM | POA: Diagnosis not present

## 2015-10-23 DIAGNOSIS — R0602 Shortness of breath: Secondary | ICD-10-CM | POA: Diagnosis not present

## 2015-10-23 DIAGNOSIS — J9621 Acute and chronic respiratory failure with hypoxia: Secondary | ICD-10-CM | POA: Diagnosis not present

## 2015-10-23 DIAGNOSIS — J441 Chronic obstructive pulmonary disease with (acute) exacerbation: Secondary | ICD-10-CM | POA: Diagnosis present

## 2015-10-23 DIAGNOSIS — J9622 Acute and chronic respiratory failure with hypercapnia: Secondary | ICD-10-CM | POA: Diagnosis present

## 2015-10-23 DIAGNOSIS — H9190 Unspecified hearing loss, unspecified ear: Secondary | ICD-10-CM | POA: Diagnosis present

## 2015-10-23 DIAGNOSIS — Z85118 Personal history of other malignant neoplasm of bronchus and lung: Secondary | ICD-10-CM

## 2015-10-23 DIAGNOSIS — C3490 Malignant neoplasm of unspecified part of unspecified bronchus or lung: Secondary | ICD-10-CM | POA: Diagnosis not present

## 2015-10-23 DIAGNOSIS — M545 Low back pain: Secondary | ICD-10-CM | POA: Diagnosis not present

## 2015-10-23 DIAGNOSIS — E785 Hyperlipidemia, unspecified: Secondary | ICD-10-CM | POA: Diagnosis present

## 2015-10-23 DIAGNOSIS — R4182 Altered mental status, unspecified: Secondary | ICD-10-CM

## 2015-10-23 DIAGNOSIS — Z66 Do not resuscitate: Secondary | ICD-10-CM | POA: Diagnosis present

## 2015-10-23 DIAGNOSIS — E871 Hypo-osmolality and hyponatremia: Secondary | ICD-10-CM | POA: Diagnosis not present

## 2015-10-23 DIAGNOSIS — Z9981 Dependence on supplemental oxygen: Secondary | ICD-10-CM | POA: Diagnosis not present

## 2015-10-23 DIAGNOSIS — Z7952 Long term (current) use of systemic steroids: Secondary | ICD-10-CM | POA: Diagnosis not present

## 2015-10-23 DIAGNOSIS — F329 Major depressive disorder, single episode, unspecified: Secondary | ICD-10-CM | POA: Diagnosis present

## 2015-10-23 DIAGNOSIS — M549 Dorsalgia, unspecified: Secondary | ICD-10-CM | POA: Diagnosis present

## 2015-10-23 DIAGNOSIS — R451 Restlessness and agitation: Secondary | ICD-10-CM | POA: Insufficient documentation

## 2015-10-23 DIAGNOSIS — I110 Hypertensive heart disease with heart failure: Secondary | ICD-10-CM | POA: Diagnosis present

## 2015-10-23 DIAGNOSIS — M4854XA Collapsed vertebra, not elsewhere classified, thoracic region, initial encounter for fracture: Secondary | ICD-10-CM | POA: Diagnosis not present

## 2015-10-23 DIAGNOSIS — Z87891 Personal history of nicotine dependence: Secondary | ICD-10-CM | POA: Diagnosis not present

## 2015-10-23 DIAGNOSIS — J449 Chronic obstructive pulmonary disease, unspecified: Secondary | ICD-10-CM | POA: Diagnosis present

## 2015-10-23 DIAGNOSIS — I5033 Acute on chronic diastolic (congestive) heart failure: Secondary | ICD-10-CM | POA: Diagnosis not present

## 2015-10-23 DIAGNOSIS — Z85828 Personal history of other malignant neoplasm of skin: Secondary | ICD-10-CM

## 2015-10-23 DIAGNOSIS — Z515 Encounter for palliative care: Secondary | ICD-10-CM | POA: Diagnosis not present

## 2015-10-23 DIAGNOSIS — R410 Disorientation, unspecified: Secondary | ICD-10-CM | POA: Diagnosis not present

## 2015-10-23 DIAGNOSIS — Z923 Personal history of irradiation: Secondary | ICD-10-CM

## 2015-10-23 DIAGNOSIS — I959 Hypotension, unspecified: Secondary | ICD-10-CM | POA: Diagnosis not present

## 2015-10-23 DIAGNOSIS — R000 Tachycardia, unspecified: Secondary | ICD-10-CM | POA: Diagnosis not present

## 2015-10-23 DIAGNOSIS — K117 Disturbances of salivary secretion: Secondary | ICD-10-CM | POA: Insufficient documentation

## 2015-10-23 DIAGNOSIS — M546 Pain in thoracic spine: Secondary | ICD-10-CM | POA: Diagnosis not present

## 2015-10-23 DIAGNOSIS — R402441 Other coma, without documented Glasgow coma scale score, or with partial score reported, in the field [EMT or ambulance]: Secondary | ICD-10-CM | POA: Diagnosis not present

## 2015-10-23 DIAGNOSIS — R0600 Dyspnea, unspecified: Secondary | ICD-10-CM | POA: Diagnosis not present

## 2015-10-23 DIAGNOSIS — J849 Interstitial pulmonary disease, unspecified: Secondary | ICD-10-CM | POA: Diagnosis not present

## 2015-10-23 DIAGNOSIS — I10 Essential (primary) hypertension: Secondary | ICD-10-CM | POA: Diagnosis present

## 2015-10-23 DIAGNOSIS — E860 Dehydration: Secondary | ICD-10-CM | POA: Diagnosis not present

## 2015-10-23 DIAGNOSIS — F32A Depression, unspecified: Secondary | ICD-10-CM | POA: Diagnosis present

## 2015-10-23 DIAGNOSIS — Z7982 Long term (current) use of aspirin: Secondary | ICD-10-CM | POA: Diagnosis not present

## 2015-10-23 HISTORY — DX: Heart failure, unspecified: I50.9

## 2015-10-23 LAB — BASIC METABOLIC PANEL
ANION GAP: 12 (ref 5–15)
BUN: 29 mg/dL — ABNORMAL HIGH (ref 6–20)
CHLORIDE: 89 mmol/L — AB (ref 101–111)
CO2: 31 mmol/L (ref 22–32)
Calcium: 9 mg/dL (ref 8.9–10.3)
Creatinine, Ser: 1.04 mg/dL — ABNORMAL HIGH (ref 0.44–1.00)
GFR calc Af Amer: 58 mL/min — ABNORMAL LOW (ref >60)
GFR, EST NON AFRICAN AMERICAN: 50 mL/min — AB (ref >60)
Glucose, Bld: 117 mg/dL — ABNORMAL HIGH (ref 65–99)
POTASSIUM: 3.9 mmol/L (ref 3.5–5.1)
SODIUM: 132 mmol/L — AB (ref 135–145)

## 2015-10-23 LAB — I-STAT VENOUS BLOOD GAS, ED
ACID-BASE EXCESS: 14 mmol/L — AB (ref 0.0–2.0)
Bicarbonate: 40.9 mEq/L — ABNORMAL HIGH (ref 20.0–24.0)
O2 SAT: 71 %
PO2 VEN: 38 mmHg (ref 30.0–45.0)
TCO2: 43 mmol/L (ref 0–100)
pCO2, Ven: 63.1 mmHg — ABNORMAL HIGH (ref 45.0–50.0)
pH, Ven: 7.419 — ABNORMAL HIGH (ref 7.250–7.300)

## 2015-10-23 LAB — URINALYSIS, ROUTINE W REFLEX MICROSCOPIC
Bilirubin Urine: NEGATIVE
GLUCOSE, UA: NEGATIVE mg/dL
HGB URINE DIPSTICK: NEGATIVE
Ketones, ur: NEGATIVE mg/dL
Leukocytes, UA: NEGATIVE
Nitrite: NEGATIVE
PROTEIN: NEGATIVE mg/dL
Specific Gravity, Urine: 1.016 (ref 1.005–1.030)
pH: 6.5 (ref 5.0–8.0)

## 2015-10-23 LAB — I-STAT TROPONIN, ED: Troponin i, poc: 0.04 ng/mL (ref 0.00–0.08)

## 2015-10-23 LAB — DIFFERENTIAL
BASOS PCT: 0 %
Basophils Absolute: 0 10*3/uL (ref 0.0–0.1)
Eosinophils Absolute: 0.1 10*3/uL (ref 0.0–0.7)
Eosinophils Relative: 1 %
Lymphocytes Relative: 9 %
Lymphs Abs: 1.4 10*3/uL (ref 0.7–4.0)
Monocytes Absolute: 0.7 10*3/uL (ref 0.1–1.0)
Monocytes Relative: 5 %
NEUTROS ABS: 12.4 10*3/uL — AB (ref 1.7–7.7)
Neutrophils Relative %: 85 %

## 2015-10-23 LAB — CBC
HEMATOCRIT: 40.8 % (ref 36.0–46.0)
HEMOGLOBIN: 12.3 g/dL (ref 12.0–15.0)
MCH: 28.7 pg (ref 26.0–34.0)
MCHC: 30.1 g/dL (ref 30.0–36.0)
MCV: 95.3 fL (ref 78.0–100.0)
Platelets: 152 10*3/uL (ref 150–400)
RBC: 4.28 MIL/uL (ref 3.87–5.11)
RDW: 14.2 % (ref 11.5–15.5)
WBC: 14.7 10*3/uL — AB (ref 4.0–10.5)

## 2015-10-23 LAB — I-STAT CG4 LACTIC ACID, ED
LACTIC ACID, VENOUS: 0.86 mmol/L (ref 0.5–2.0)
LACTIC ACID, VENOUS: 0.56 mmol/L (ref 0.5–2.0)

## 2015-10-23 LAB — BRAIN NATRIURETIC PEPTIDE: B NATRIURETIC PEPTIDE 5: 132.8 pg/mL — AB (ref 0.0–100.0)

## 2015-10-23 MED ORDER — IPRATROPIUM-ALBUTEROL 0.5-2.5 (3) MG/3ML IN SOLN
3.0000 mL | Freq: Once | RESPIRATORY_TRACT | Status: AC
  Administered 2015-10-23: 3 mL via RESPIRATORY_TRACT
  Filled 2015-10-23: qty 3

## 2015-10-23 MED ORDER — MORPHINE SULFATE (PF) 4 MG/ML IV SOLN
4.0000 mg | Freq: Once | INTRAVENOUS | Status: AC
  Administered 2015-10-23: 2 mg via INTRAVENOUS
  Filled 2015-10-23: qty 1

## 2015-10-23 MED ORDER — PREDNISONE 20 MG PO TABS
20.0000 mg | ORAL_TABLET | Freq: Once | ORAL | Status: AC
  Administered 2015-10-23: 20 mg via ORAL
  Filled 2015-10-23: qty 1

## 2015-10-23 MED ORDER — IPRATROPIUM-ALBUTEROL 0.5-2.5 (3) MG/3ML IN SOLN: 3.0000 mL | Freq: Once | RESPIRATORY_TRACT | Status: DC

## 2015-10-23 NOTE — ED Notes (Signed)
Pt unable to get comfortable, c/o back pain

## 2015-10-23 NOTE — ED Notes (Signed)
Pt to ED by [facility] c/o shortness of breath\. Initial sats 82% on 2L of home oxygen, rales noted by EMS. Sats improved with 15L NRB

## 2015-10-23 NOTE — ED Provider Notes (Signed)
CSN: [id number]     Arrival date & time 10/13/2015  2002 History   First MD Initiated Contact with Patient 10/25/2015 2014     Chief Complaint  Patient presents with  . Shortness of Breath     (Consider location/radiation/quality/duration/timing/severity/associated sxs/prior Treatment) HPI  81 year old female with history of COPD and diastolic heart failure with EF of 55-60%, on 2 L supplemental oxygen, who presents with shortness of breath.  History is primarily obtained through the patient's daughter who lives with her at home. She was recently discharged from the hospital 2 days ago after treatment for COPD exacerbation and acute on chronic diastolic heart failure exacerbation. States  That she has been very weak since discharge home, with decreased appetite. I has been compliant with her home medications including steroids and diuretics. She states that even before hospitalization she was having back pain, but progressively worsened during the hospitalization. Had a CTA chest that overall was unremarkable. Diagnosed with musculoskeletal pain. Has been taking oxycodone and Flexeril for pain. She noted some confusion with taking Flexeril which her daughter discontinued , and patient has also been complaining of worsening pain despite  These medications. Daughter states she has not been sleeping or eating. Tonight her mental status seems to be significantly worse, and daughter states that she called EMS as patient seemed hysterical due to her pain. When EMS arrived, patient was not wearing her home oxygen and had hypoxia to 84%. States that her O2 initially seemed to make her mental status clear, but states persistent confusion.   Denies increased cough, edema, orthopnea, PND, fever, chills.    Past Medical History  Diagnosis Date  . Hypertension   . Dyslipidemia   . Hard of hearing   . Hx of radiation therapy 06/18/13- 07/23/13    LUL lung primary, 7020 cGy 26 sessions  . COPD (chronic  obstructive pulmonary disease) ([place])   . prediabetes   . Lung cancer ([place]) 05/20/13    LUL, squamous cell  . Skin cancer     basal cell  . CHF (congestive heart failure) [facility])    Past Surgical History  Procedure Laterality Date  . Tonsillectomy      as child, repeat surgery early 20's  . Mohs surgery      "several times"  . Facial reconstruction surgery  25 years ago    to remove skin cancer, right side of face   Family History  Problem Relation Age of Onset  . Hypertension    . Lung cancer Sister     tx w/radiation, met to stomach  . Pneumonia Mother     Deceased  . Heart attack Father     Deceased  . Cancer Sister     "back of neck"   Social History  Substance Use Topics  . Smoking status: Former Smoker -- 0.50 packs/day for 60 years    Types: Cigarettes    Quit date: 05/12/2015  . Smokeless tobacco: None     Comment: 06/05/13 currently 1/2 PPD, trying to quit  . Alcohol Use: Yes     Comment: occ wine, 2 drinks nightly   OB History    No data available     Review of Systems 10/14 systems reviewed and are negative other than those stated in the HPI    Allergies  Flexeril and Paxil  Home Medications   Prior to Admission medications   Medication Sig Start Date End Date Taking? Authorizing Provider  aspirin 81 MG tablet Take 81  mg by mouth daily.   Yes Historical Provider, MD  atorvastatin (LIPITOR) 80 MG tablet TAKE 1 TABLET EVERY DAY FOR CHOLESTEROL Patient taking differently: TAKE 1 TABLET BY MOUTH ONLY ON WED AND SUN EACH WEEK FOR CHOLESTEROL 08/29/15  Yes Unk Pinto, MD  citalopram (CELEXA) 20 MG tablet Take 1 tablet (20 mg total) by mouth daily. Patient taking differently: Take 40 mg by mouth daily.  10/21/15  Yes Orson Eva, MD  doxazosin (CARDURA) 8 MG tablet TAKE 1 TABLET AT BEDTIME 06/29/15  Yes Unk Pinto, MD  furosemide (LASIX) 40 MG tablet Take 1 tablet (40 mg total) by mouth daily. 10/21/15  Yes Orson Eva, MD  guaifenesin (HUMIBID E) 400  MG TABS tablet Take 400 mg by mouth 2 (two) times daily.   Yes Historical Provider, MD  HYDROcodone-acetaminophen (NORCO) 5-325 MG tablet Take 2 tablets by mouth every 4 (four) hours as needed. Patient taking differently: Take 2 tablets by mouth every 4 (four) hours as needed for moderate pain.  10/05/15  Yes Courtney Forcucci, PA-C  ipratropium-albuterol (DUONEB) 0.5-2.5 (3) MG/3ML SOLN TAKE 3 MLS BY NEBULIZATION 4 (FOUR) TIMES DAILY. 06/09/15  Yes Unk Pinto, MD  levofloxacin (LEVAQUIN) 500 MG tablet Take 1 tablet (500 mg total) by mouth at bedtime. 10/21/15  Yes Orson Eva, MD  losartan (COZAAR) 100 MG tablet TAKE 1 TABLET BY MOUTH DAILY AS NEEDED FOR BLOOD PRESSURE Patient taking differently: TAKE 1 TABLET BY MOUTH DAILY IN MORNING FOR BLOOD PRESSURE 09/11/15  Yes Unk Pinto, MD  oxyCODONE-acetaminophen (PERCOCET/ROXICET) 5-325 MG per tablet Take two tablets by mouth every four hours as needed for pain. Do not exceed 4gms of Tylenol in 24 hours Patient taking differently: Take 2 tablets by mouth every 4 (four) hours as needed for moderate pain. Take two tablets by mouth every four hours as needed for pain. Do not exceed 4gms of Tylenol in 24 hours 05/06/15  Yes Theodis Blaze, MD  potassium chloride (K-DUR,KLOR-CON) 10 MEQ tablet Take 1 tablet (10 mEq total) by mouth daily. 10/21/15  Yes Orson Eva, MD  predniSONE (DELTASONE) 10 MG tablet Take 5 tablets (50 mg total) by mouth daily with breakfast. And decrease by 1 tablet daily 10/22/15  Yes Orson Eva, MD  PROAIR HFA 108 (90 BASE) MCG/ACT inhaler 1 TO 2 INHALATIONS EVERY 4 HOURS AS NEEDED FOR RESCUE ASTHMA 04/18/15  Yes Vicie Mutters, PA-C  Vitamin D, Ergocalciferol, (DRISDOL) 50000 UNITS CAPS capsule TAKE ONE CAPSULE EVERY DAY Patient taking differently: TAKE ONE CAPSULE BY MOUTH EVERY WED AND SUN 06/01/15  Yes Vicie Mutters, PA-C   BP 120/79 mmHg  Pulse 98  Temp(Src) 98.6 F (37 C) (Axillary)  Resp 24  SpO2 92% Physical Exam Physical Exam   Nursing note and vitals reviewed. Constitutional: Elderly woman, dissheveled, well nourished, non-toxic, and in no acute distress Head: Normocephalic and atraumatic.  Mouth/Throat: Oropharynx is clear and moist.  Neck: Normal range of motion. Neck supple.  Cardiovascular: Tachycardic rate and regular rhythm.  mild edema bilaterally Pulmonary/Chest: Effort normal, poor air movement with expiratory wheezes throughout Abdominal: Soft. There is no tenderness. There is no rebound and no guarding.  Musculoskeletal: Normal range of motion.  Neurological: Alert, no facial droop, fluent speech, moves all extremities symmetrically Skin: Skin is warm and dry.  Psychiatric: Cooperative  ED Course  Procedures (including critical care time) Labs Review Labs Reviewed  BASIC METABOLIC PANEL - Abnormal; Notable for the following:    Sodium 132 (*)    Chloride  89 (*)    Glucose, Bld 117 (*)    BUN 29 (*)    Creatinine, Ser 1.04 (*)    GFR calc non Af Amer 50 (*)    GFR calc Af Amer 58 (*)    All other components within normal limits  CBC - Abnormal; Notable for the following:    WBC 14.7 (*)    All other components within normal limits  BRAIN NATRIURETIC PEPTIDE - Abnormal; Notable for the following:    B Natriuretic Peptide 132.8 (*)    All other components within normal limits  DIFFERENTIAL - Abnormal; Notable for the following:    Neutro Abs 12.4 (*)    All other components within normal limits  I-STAT VENOUS BLOOD GAS, ED - Abnormal; Notable for the following:    pH, Ven 7.419 (*)    pCO2, Ven 63.1 (*)    Bicarbonate 40.9 (*)    Acid-Base Excess 14.0 (*)    All other components within normal limits  BLOOD GAS, VENOUS  URINALYSIS, ROUTINE W REFLEX MICROSCOPIC (NOT AT [facility])  I-STAT TROPOININ, ED  I-STAT CG4 LACTIC ACID, ED  I-STAT CG4 LACTIC ACID, ED    Imaging Review Dg Chest 2 View  10/10/2015  CLINICAL DATA:  Initial evaluation for acute shortness of breath for 1 day. EXAM:  CHEST  2 VIEW COMPARISON:  Prior study from 10/16/2015. FINDINGS: Cardiac and mediastinal silhouettes are stable in size and contour, and remain within normal limits. Lung volumes are mildly reduced, similar to prior. Prominence of the central perihilar vasculature without overt pulmonary edema. Patchy bilateral interstitial and alveolar airspace opacities are also similar to previous, likely chronic in nature, again possibly representing underlying interstitial lung disease as seen on prior CTs. No pulmonary edema or pleural effusion. No pneumothorax. No acute osseus abnormality. Scattered multilevel degenerative changes within the visualized spine. Sequela with vertebral augmentation within the upper thoracic spine, stable. IMPRESSION: 1. No active cardiopulmonary disease. 2. Scattered patchy and interstitial opacities, similar to multiple previous studies, and likely chronic in nature. Again, these changes likely reflect underlying COPD and possibly underlying interstitial lung disease as seen on prior CTs. Electronically Signed   By: Jeannine Boga M.D.   On: 10/21/2015 21:11   Ct Head Wo Contrast  10/26/2015  CLINICAL DATA:  Confusion and altered mental status. EXAM: CT HEAD WITHOUT CONTRAST TECHNIQUE: Contiguous axial images were obtained from the base of the skull through the vertex without intravenous contrast. COMPARISON:  None. FINDINGS: Mild atrophy. Moderate chronic small vessel ischemia.No intracranial hemorrhage, mass effect, or midline shift. No hydrocephalus. The basilar cisterns are patent. No evidence of territorial infarct. No intracranial fluid collection. Calvarium is intact. Included paranasal sinuses are well aerated. Postsurgical change versus hypoplasia of right mastoid air cells. IMPRESSION: Atrophy and chronic small vessel ischemia without acute intracranial abnormality. Electronically Signed   By: Jeb Levering M.D.   On: 10/18/2015 23:04   I have personally reviewed and  evaluated these images and lab results as part of my medical decision-making.   EKG Interpretation   Date/Time:  Sunday October 23 2015 20:08:05 EST Ventricular Rate:  99 PR Interval:  123 QRS Duration: 98 QT Interval:  355 QTC Calculation: 456 R Axis:   -37 Text Interpretation:  Sinus tachycardia Left axis deviation Artifact in  lead(s) I II III aVR aVL aVF V1 V2 No significant change since last  tracing Confirmed by Roniqua Kintz MD, Madlyn Crosby ([id number]) on 10/26/2015 8:15:18 PM  MDM   Final diagnoses:  Altered mental status, unspecified altered mental status type     81 year old female with history of COPD, diastolic heart failure, and prior history of lung cancer on 2 L supplemental oxygen  Who presents with altered mental status and shortness of breath. Shortness of breath seems to be more related to not wearing her oxygen 15 minutes prior to EMS arrival. She is on a nonrebreather on presentation, but is able to be weaned to 2 L nasal cannula with oxygen in the high 80s to low 90 percentile , which is her baseline. She denies any acute dyspnea at this time , but does have some expiratory wheezes and poor air movement on lung exam. Clinically does not seem fluid overloaded. Is given a breathing treatment, and is given 20 mg prednisone taper dosage that was prescribed to her.  She is grossly neurologically intact, but does have some confusion answering questions inappropriately and some disorientation. CT head is negative, there are no major metabolic or electrolyte derangements. No underlying infection on x-ray and urinalysis. Mild leukocytosis, but this in the setting of her steroid use. In regards to her back pain, this seems chronic in nature, and present during her prior admission, and she had a CTA chest that was negative for any musculoskeletal injury , PE, or aortic  Process.  I feel that overall her altered mental status may be in the setting of not sleeping over past 2-3 days from back pain  and jerking. Did discuss this with Dr. Almeta Monas from hospitalist service. He will admit to observation for ongoing management.  Forde Dandy, MD 10/24/15 [id number]

## 2015-10-24 ENCOUNTER — Encounter (HOSPITAL_COMMUNITY): Payer: Self-pay | Admitting: Family Medicine

## 2015-10-24 ENCOUNTER — Inpatient Hospital Stay (HOSPITAL_COMMUNITY): Payer: Medicare Other

## 2015-10-24 DIAGNOSIS — I110 Hypertensive heart disease with heart failure: Secondary | ICD-10-CM | POA: Diagnosis present

## 2015-10-24 DIAGNOSIS — G9341 Metabolic encephalopathy: Secondary | ICD-10-CM | POA: Diagnosis present

## 2015-10-24 DIAGNOSIS — E871 Hypo-osmolality and hyponatremia: Secondary | ICD-10-CM | POA: Diagnosis present

## 2015-10-24 DIAGNOSIS — I10 Essential (primary) hypertension: Secondary | ICD-10-CM

## 2015-10-24 DIAGNOSIS — Z515 Encounter for palliative care: Secondary | ICD-10-CM | POA: Diagnosis not present

## 2015-10-24 DIAGNOSIS — R410 Disorientation, unspecified: Secondary | ICD-10-CM | POA: Diagnosis present

## 2015-10-24 DIAGNOSIS — I959 Hypotension, unspecified: Secondary | ICD-10-CM | POA: Diagnosis not present

## 2015-10-24 DIAGNOSIS — Z7952 Long term (current) use of systemic steroids: Secondary | ICD-10-CM | POA: Diagnosis not present

## 2015-10-24 DIAGNOSIS — C3490 Malignant neoplasm of unspecified part of unspecified bronchus or lung: Secondary | ICD-10-CM | POA: Diagnosis not present

## 2015-10-24 DIAGNOSIS — J9622 Acute and chronic respiratory failure with hypercapnia: Secondary | ICD-10-CM

## 2015-10-24 DIAGNOSIS — Z7982 Long term (current) use of aspirin: Secondary | ICD-10-CM | POA: Diagnosis not present

## 2015-10-24 DIAGNOSIS — J441 Chronic obstructive pulmonary disease with (acute) exacerbation: Secondary | ICD-10-CM | POA: Diagnosis present

## 2015-10-24 DIAGNOSIS — R4182 Altered mental status, unspecified: Secondary | ICD-10-CM

## 2015-10-24 DIAGNOSIS — R451 Restlessness and agitation: Secondary | ICD-10-CM | POA: Diagnosis not present

## 2015-10-24 DIAGNOSIS — J9621 Acute and chronic respiratory failure with hypoxia: Secondary | ICD-10-CM | POA: Diagnosis not present

## 2015-10-24 DIAGNOSIS — H9190 Unspecified hearing loss, unspecified ear: Secondary | ICD-10-CM | POA: Diagnosis present

## 2015-10-24 DIAGNOSIS — R0600 Dyspnea, unspecified: Secondary | ICD-10-CM | POA: Diagnosis not present

## 2015-10-24 DIAGNOSIS — Z9981 Dependence on supplemental oxygen: Secondary | ICD-10-CM | POA: Diagnosis not present

## 2015-10-24 DIAGNOSIS — Z85828 Personal history of other malignant neoplasm of skin: Secondary | ICD-10-CM | POA: Diagnosis not present

## 2015-10-24 DIAGNOSIS — I5033 Acute on chronic diastolic (congestive) heart failure: Secondary | ICD-10-CM | POA: Diagnosis present

## 2015-10-24 DIAGNOSIS — R0602 Shortness of breath: Secondary | ICD-10-CM | POA: Diagnosis present

## 2015-10-24 DIAGNOSIS — Z85118 Personal history of other malignant neoplasm of bronchus and lung: Secondary | ICD-10-CM | POA: Diagnosis not present

## 2015-10-24 DIAGNOSIS — M549 Dorsalgia, unspecified: Secondary | ICD-10-CM | POA: Diagnosis present

## 2015-10-24 DIAGNOSIS — M545 Low back pain: Secondary | ICD-10-CM | POA: Diagnosis not present

## 2015-10-24 DIAGNOSIS — Z66 Do not resuscitate: Secondary | ICD-10-CM | POA: Diagnosis present

## 2015-10-24 DIAGNOSIS — Z87891 Personal history of nicotine dependence: Secondary | ICD-10-CM | POA: Diagnosis not present

## 2015-10-24 DIAGNOSIS — E860 Dehydration: Secondary | ICD-10-CM | POA: Diagnosis present

## 2015-10-24 DIAGNOSIS — E785 Hyperlipidemia, unspecified: Secondary | ICD-10-CM | POA: Diagnosis present

## 2015-10-24 DIAGNOSIS — Z923 Personal history of irradiation: Secondary | ICD-10-CM | POA: Diagnosis not present

## 2015-10-24 DIAGNOSIS — M546 Pain in thoracic spine: Secondary | ICD-10-CM | POA: Diagnosis not present

## 2015-10-24 DIAGNOSIS — J449 Chronic obstructive pulmonary disease, unspecified: Secondary | ICD-10-CM

## 2015-10-24 DIAGNOSIS — F329 Major depressive disorder, single episode, unspecified: Secondary | ICD-10-CM | POA: Diagnosis not present

## 2015-10-24 DIAGNOSIS — J849 Interstitial pulmonary disease, unspecified: Secondary | ICD-10-CM | POA: Diagnosis present

## 2015-10-24 DIAGNOSIS — K117 Disturbances of salivary secretion: Secondary | ICD-10-CM | POA: Diagnosis not present

## 2015-10-24 DIAGNOSIS — M4854XA Collapsed vertebra, not elsewhere classified, thoracic region, initial encounter for fracture: Secondary | ICD-10-CM | POA: Diagnosis present

## 2015-10-24 DIAGNOSIS — R000 Tachycardia, unspecified: Secondary | ICD-10-CM | POA: Diagnosis not present

## 2015-10-24 LAB — CBC
HCT: 39.3 % (ref 36.0–46.0)
HEMATOCRIT: 39.2 % (ref 36.0–46.0)
HEMOGLOBIN: 11.7 g/dL — AB (ref 12.0–15.0)
HEMOGLOBIN: 11.9 g/dL — AB (ref 12.0–15.0)
MCH: 28.5 pg (ref 26.0–34.0)
MCH: 28.8 pg (ref 26.0–34.0)
MCHC: 29.8 g/dL — ABNORMAL LOW (ref 30.0–36.0)
MCHC: 30.4 g/dL (ref 30.0–36.0)
MCV: 95.6 fL (ref 78.0–100.0)
MCV: 94.9 fL (ref 78.0–100.0)
PLATELETS: 140 10*3/uL — AB (ref 150–400)
Platelets: 156 10*3/uL (ref 150–400)
RBC: 4.11 MIL/uL (ref 3.87–5.11)
RBC: 4.13 MIL/uL (ref 3.87–5.11)
RDW: 14.3 % (ref 11.5–15.5)
RDW: 14 % (ref 11.5–15.5)
WBC: 14.9 10*3/uL — AB (ref 4.0–10.5)
WBC: 12.9 10*3/uL — AB (ref 4.0–10.5)

## 2015-10-24 LAB — I-STAT ARTERIAL BLOOD GAS, ED
ACID-BASE EXCESS: 14 mmol/L — AB (ref 0.0–2.0)
Bicarbonate: 40.9 mEq/L — ABNORMAL HIGH (ref 20.0–24.0)
O2 SAT: 97 %
PCO2 ART: 59.8 mmHg — AB (ref 35.0–45.0)
PH ART: 7.443 (ref 7.350–7.450)
Patient temperature: 98.6
TCO2: 43 mmol/L (ref 0–100)
pO2, Arterial: 94 mmHg (ref 80.0–100.0)

## 2015-10-24 LAB — BASIC METABOLIC PANEL
ANION GAP: 12 (ref 5–15)
BUN: 25 mg/dL — ABNORMAL HIGH (ref 6–20)
CHLORIDE: 94 mmol/L — AB (ref 101–111)
CO2: 34 mmol/L — ABNORMAL HIGH (ref 22–32)
CREATININE: 1.02 mg/dL — AB (ref 0.44–1.00)
Calcium: 9.3 mg/dL (ref 8.9–10.3)
GFR calc non Af Amer: 51 mL/min — ABNORMAL LOW (ref >60)
GFR, EST AFRICAN AMERICAN: 59 mL/min — AB (ref >60)
Glucose, Bld: 127 mg/dL — ABNORMAL HIGH (ref 65–99)
POTASSIUM: 4.7 mmol/L (ref 3.5–5.1)
SODIUM: 140 mmol/L (ref 135–145)

## 2015-10-24 LAB — OSMOLALITY, URINE: Osmolality, Ur: 414 mOsm/kg (ref 300–900)

## 2015-10-24 LAB — OSMOLALITY: Osmolality: 291 mOsm/kg (ref 275–295)

## 2015-10-24 MED ORDER — SODIUM CHLORIDE 0.9% FLUSH
3.0000 mL | Freq: Two times a day (BID) | INTRAVENOUS | Status: DC
  Administered 2015-10-24: 3 mL via INTRAVENOUS
  Administered 2015-10-24: 10 mL via INTRAVENOUS
  Administered 2015-10-25 – 2015-10-28 (×4): 3 mL via INTRAVENOUS

## 2015-10-24 MED ORDER — IPRATROPIUM-ALBUTEROL 0.5-2.5 (3) MG/3ML IN SOLN
3.0000 mL | Freq: Four times a day (QID) | RESPIRATORY_TRACT | Status: DC
  Administered 2015-10-24 – 2015-10-25 (×4): 3 mL via RESPIRATORY_TRACT
  Filled 2015-10-24 (×4): qty 3

## 2015-10-24 MED ORDER — SODIUM CHLORIDE 0.9 % IV BOLUS (SEPSIS)
500.0000 mL | Freq: Once | INTRAVENOUS | Status: AC
  Administered 2015-10-24: 500 mL via INTRAVENOUS

## 2015-10-24 MED ORDER — CITALOPRAM HYDROBROMIDE 20 MG PO TABS
20.0000 mg | ORAL_TABLET | Freq: Every day | ORAL | Status: DC
  Administered 2015-10-24 – 2015-10-27 (×3): 20 mg via ORAL
  Filled 2015-10-24 (×3): qty 1
  Filled 2015-10-24: qty 2

## 2015-10-24 MED ORDER — ACETAMINOPHEN 500 MG PO TABS
1000.0000 mg | ORAL_TABLET | Freq: Three times a day (TID) | ORAL | Status: DC
  Administered 2015-10-24 – 2015-10-27 (×8): 1000 mg via ORAL
  Filled 2015-10-24 (×10): qty 2

## 2015-10-24 MED ORDER — LEVOFLOXACIN 500 MG PO TABS
500.0000 mg | ORAL_TABLET | Freq: Every day | ORAL | Status: DC
  Administered 2015-10-24: 500 mg via ORAL
  Filled 2015-10-24: qty 1

## 2015-10-24 MED ORDER — SODIUM CHLORIDE 0.9 % IV SOLN
INTRAVENOUS | Status: DC
Start: 2015-10-24 — End: 2015-10-25

## 2015-10-24 MED ORDER — LOSARTAN POTASSIUM 50 MG PO TABS
50.0000 mg | ORAL_TABLET | Freq: Every day | ORAL | Status: DC
  Administered 2015-10-24 – 2015-10-25 (×2): 50 mg via ORAL
  Filled 2015-10-24 (×3): qty 1

## 2015-10-24 MED ORDER — ENOXAPARIN SODIUM 40 MG/0.4ML ~~LOC~~ SOLN
40.0000 mg | SUBCUTANEOUS | Status: DC
  Administered 2015-10-25 – 2015-10-26 (×2): 40 mg via SUBCUTANEOUS
  Filled 2015-10-24 (×4): qty 0.4

## 2015-10-24 MED ORDER — ASPIRIN EC 81 MG PO TBEC
81.0000 mg | DELAYED_RELEASE_TABLET | Freq: Every day | ORAL | Status: DC
  Administered 2015-10-24 – 2015-10-25 (×2): 81 mg via ORAL
  Filled 2015-10-24 (×2): qty 1

## 2015-10-24 MED ORDER — GUAIFENESIN 200 MG PO TABS
400.0000 mg | ORAL_TABLET | Freq: Two times a day (BID) | ORAL | Status: DC
  Administered 2015-10-24 – 2015-10-27 (×5): 400 mg via ORAL
  Filled 2015-10-24 (×11): qty 2

## 2015-10-24 MED ORDER — DOXAZOSIN MESYLATE 8 MG PO TABS
8.0000 mg | ORAL_TABLET | Freq: Every day | ORAL | Status: DC
  Administered 2015-10-24 – 2015-10-27 (×2): 8 mg via ORAL
  Filled 2015-10-24: qty 1
  Filled 2015-10-24: qty 2
  Filled 2015-10-24 (×3): qty 1

## 2015-10-24 MED ORDER — ATORVASTATIN CALCIUM 80 MG PO TABS
80.0000 mg | ORAL_TABLET | Freq: Every day | ORAL | Status: DC
  Administered 2015-10-24: 80 mg via ORAL
  Filled 2015-10-24: qty 1

## 2015-10-24 MED ORDER — PREDNISONE 50 MG PO TABS
50.0000 mg | ORAL_TABLET | Freq: Every day | ORAL | Status: DC
  Administered 2015-10-24: 50 mg via ORAL
  Filled 2015-10-24: qty 3

## 2015-10-24 NOTE — ED Notes (Signed)
MD at bedside. 

## 2015-10-24 NOTE — ED Notes (Signed)
Attempted to call report

## 2015-10-24 NOTE — ED Notes (Signed)
Attempted report 

## 2015-10-24 NOTE — ED Notes (Signed)
Patient transported to X-ray 

## 2015-10-24 NOTE — Progress Notes (Signed)
[facility]                                                                              Patient Demographics  Hailey Keith, is a 81 y.o. female, DOB - 03-Aug-1935, IRW:[id number]  Admit date - 10/18/2015   Admitting Physician No admitting provider for patient encounter.  Outpatient Primary MD for the patient is Alesia Richards, MD  LOS - 0   Chief Complaint  Patient presents with  . Shortness of Breath       Brief HPI   Hailey Keith is a 81 y.o. female with a past medical history significant for COPD on home 3L O2, lung cancer who presented with altered mental status. The patient has long-standing back pain from vertebral compression fractures treated with vertebroplasty last year. One week ago she was admitted to [facility] for COPD flare, treated with levofloxacin, steroids, bronchodilators, and also believed to be in CHF because of a slightly elevated BNP, and so diuresis with furosemide and discharged on furosemide. Since discharge, she has been taking furosemide, but not taking anything by mouth, and with decreasing urine output. She's become increasingly confused, having clonic jerks, and tonight she was calling out in pain, seemed distraught, and appeared not to know what was going on around her.  In the ED, he was afebrile but tachycardic, tachypneic, and with normal blood pressure. She was initially slightly hypoxic down to 80% on her home oxygen. On VBG she had pH 7.4 , PCO2 63, PO2 38, Na 132, HCO3 31, creatinine normal, WBC 14.8, BNP 1:30, urinalysis completely clear, lactic acid normal. A CT of the head was normal, and a chest x-ray showed chronic interstitial markings consistent with her known emphysema and suspected interstitial lung disease with no focal consolidation.   Assessment & Plan    Principal Problem:   Acute encephalopathy: Multifactorial, possibly acute delirium is due to dehydration, from poor oral intake, hypercarbic respiratory  failure, hyponatremia. No pneumonia by CXR and UA clear. - Continue BiPAP, PCO2 improving - CT  head showed no acute infarct or hemorrhage  - UA negative for UTI - Per daughter at the bedside, mental status is improving  Chronic Back pain: Has history of compression fractures - Obtain x-ray of the thoracic and lumbar spine  COPD, emphysemia with chronic hypoxic and hypercapnic respiratory failure:  Goal SpO2 88-92% -Consult to Palliative Care -Continue home Duonebs q6hrs   History of Lung CA:    HTN:  Hypertensive at admission. -Continue home doxazosin and losartan -Discontinue furosemide and K   Leukocytosis:  Suspect this is from prednisone burst/taper. -Trend CBC and culture if febrile   Hyponatremia:  Improving, continue IV fluids hydration  Code Status DO NOT RESUSCITATE  Family Communication: Discussed in detail with the patient, all imaging results, lab results explained to the patient and her daughter at the bedside   Disposition Plan:  Time Spent in minutes   25 mins   Procedures  None  Consults   None  DVT Prophylaxis  Lovenox   Medications  Scheduled Meds: . acetaminophen  1,000 mg Oral TID  . aspirin EC  81 mg  Oral Daily  . atorvastatin  80 mg Oral q1800  . citalopram  20 mg Oral Daily  . doxazosin  8 mg Oral QHS  . enoxaparin (LOVENOX) injection  40 mg Subcutaneous Q24H  . guaiFENesin  400 mg Oral BID  . ipratropium-albuterol  3 mL Nebulization Q6H  . levofloxacin  500 mg Oral QHS  . losartan  50 mg Oral Daily  . predniSONE  50 mg Oral Q breakfast  . sodium chloride flush  3 mL Intravenous Q12H   Continuous Infusions: . sodium chloride     PRN Meds:.   Antibiotics   Anti-infectives    Start     Dose/Rate Route Frequency Ordered Stop   10/24/15 0700  levofloxacin (LEVAQUIN) tablet 500 mg     500 mg Oral Daily at bedtime 10/24/15 9798 10/26/15 2159        Subjective:   Hailey Keith was seen and examined today. Still  on BiPAP at the time of my examination. Difficult to obtain review of system.   Objective:   Filed Vitals:   10/24/15 1100 10/24/15 1115 10/24/15 1130 10/24/15 1133  BP: 193/83  138/94   Pulse: 94 93 61   Temp:    99.7 F (37.6 C)  TempSrc:    Rectal  Resp: 26 30    SpO2: 99% 97% 93%    No intake or output data in the 24 hours ending 10/24/15 1201   Wt Readings from Last 3 Encounters:  10/21/15 72.349 kg (159 lb 8 oz)  10/05/15 76.204 kg (168 lb)  06/27/15 67.314 kg (148 lb 6.4 oz)     Exam  General: Alert and oriented, BiPAP  HEENT:  PERRLA, EOMI,  Neck: Supple, no JVD, no masses  CVS: S1 S2 auscultated, no rubs, murmurs or gallops. Regular rate and rhythm.  RespiratoryDecreased breath sounds at the bases  Abdomen : Soft, nontender, nondistended, + bowel sounds  Ext: no cyanosis clubbing or edema  Neuro: AAOx3, Cr N's II- XII. Strength 5/5 upper and lower extremities bilaterally  Skin: No rashes  Psych: Normal affect and demeanor, alert and oriented x3    Data Review   Micro Results Recent Results (from the past 240 hour(s))  Blood culture (routine x 2)     Status: None   Collection Time: 10/16/15  1:35 PM  Result Value Ref Range Status   Specimen Description BLOOD RIGHT ANTECUBITAL  Final   Special Requests BOTTLES DRAWN AEROBIC AND ANAEROBIC 5CC  Final   Culture NO GROWTH 5 DAYS  Final   Report Status 10/21/2015 FINAL  Final  Blood culture (routine x 2)     Status: None   Collection Time: 10/16/15  1:41 PM  Result Value Ref Range Status   Specimen Description BLOOD LEFT ANTECUBITAL  Final   Special Requests BOTTLES DRAWN AEROBIC AND ANAEROBIC 5CC  Final   Culture NO GROWTH 5 DAYS  Final   Report Status 10/21/2015 FINAL  Final  MRSA PCR Screening     Status: None   Collection Time: 10/16/15  8:57 PM  Result Value Ref Range Status   MRSA by PCR NEGATIVE NEGATIVE Final    Comment:        The GeneXpert MRSA Assay (FDA approved for NASAL  specimens only), is one component of a comprehensive MRSA colonization surveillance program. It is not intended to diagnose MRSA infection nor to guide or monitor treatment for MRSA infections.   Culture, sputum-assessment     Status: None  Collection Time: 10/17/15  4:27 PM  Result Value Ref Range Status   Specimen Description SPUTUM  Final   Special Requests NONE  Final   Sputum evaluation   Final    THIS SPECIMEN IS ACCEPTABLE. RESPIRATORY CULTURE REPORT TO FOLLOW.   Report Status 10/18/2015 FINAL  Final  Culture, respiratory (NON-Expectorated)     Status: None   Collection Time: 10/17/15  4:27 PM  Result Value Ref Range Status   Specimen Description EXPECTORATED SPUTUM  Final   Special Requests NONE  Final   Gram Stain   Final    ABUNDANT WBC PRESENT,BOTH PMN AND MONONUCLEAR FEW SQUAMOUS EPITHELIAL CELLS PRESENT MODERATE GRAM VARIABLE ROD FEW YEAST Performed at [facility]    Culture   Final    NORMAL OROPHARYNGEAL FLORA Performed at [facility]    Report Status 10/21/2015 FINAL  Final    Radiology Reports Dg Chest 2 View  10/16/2015  CLINICAL DATA:  Initial evaluation for acute shortness of breath for 1 day. EXAM: CHEST  2 VIEW COMPARISON:  Prior study from 10/16/2015. FINDINGS: Cardiac and mediastinal silhouettes are stable in size and contour, and remain within normal limits. Lung volumes are mildly reduced, similar to prior. Prominence of the central perihilar vasculature without overt pulmonary edema. Patchy bilateral interstitial and alveolar airspace opacities are also similar to previous, likely chronic in nature, again possibly representing underlying interstitial lung disease as seen on prior CTs. No pulmonary edema or pleural effusion. No pneumothorax. No acute osseus abnormality. Scattered multilevel degenerative changes within the visualized spine. Sequela with vertebral augmentation within the upper thoracic spine, stable. IMPRESSION: 1. No  active cardiopulmonary disease. 2. Scattered patchy and interstitial opacities, similar to multiple previous studies, and likely chronic in nature. Again, these changes likely reflect underlying COPD and possibly underlying interstitial lung disease as seen on prior CTs. Electronically Signed   By: Jeannine Boga M.D.   On: 10/29/2015 21:11   Ct Head Wo Contrast  10/11/2015  CLINICAL DATA:  Confusion and altered mental status. EXAM: CT HEAD WITHOUT CONTRAST TECHNIQUE: Contiguous axial images were obtained from the base of the skull through the vertex without intravenous contrast. COMPARISON:  None. FINDINGS: Mild atrophy. Moderate chronic small vessel ischemia.No intracranial hemorrhage, mass effect, or midline shift. No hydrocephalus. The basilar cisterns are patent. No evidence of territorial infarct. No intracranial fluid collection. Calvarium is intact. Included paranasal sinuses are well aerated. Postsurgical change versus hypoplasia of right mastoid air cells. IMPRESSION: Atrophy and chronic small vessel ischemia without acute intracranial abnormality. Electronically Signed   By: Jeb Levering M.D.   On: 10/13/2015 23:04   Ct Chest W Contrast  10/14/2015  CLINICAL DATA:  Lung cancer diagnosed in 2014 with radiation therapy complete. Cough and shortness of breath. Restaging. EXAM: CT CHEST WITH CONTRAST TECHNIQUE: Multidetector CT imaging of the chest was performed during intravenous contrast administration. CONTRAST:  58m OMNIPAQUE IOHEXOL 300 MG/ML  SOLN COMPARISON:  03/23/2015. FINDINGS: Mediastinum/Nodes: No supraclavicular adenopathy. Advanced aortic and branch vessel atherosclerosis. Mild cardiomegaly with a new small pericardial effusion. Multivessel coronary artery atherosclerosis. No central pulmonary embolism, on this non-dedicated study. No mediastinal or hilar adenopathy. Lungs/Pleura: Similar trace right pleural fluid. Advanced centrilobular emphysema. Probable secretions in the non  dependent trachea. Minimal motion degradation inferiorly. 4 mm right upper lobe pulmonary nodule on image 21/series 5 is unchanged. Soft tissue thickening along the right minor fissure measures maximally 1.8 x 1.2 cm on image 37/series 5. 1.8 x 1.7 cm  on the prior. Favored to represent a volume loss rather than a true nodule. left upper lobe pulmonary nodule which measures 5 mm on image 25/ series 5, similar. Posterior left upper lobe irregular nodule measures maximally 8 mm on image 15/series 5, similar. Left lower lobe nodule measures 5 mm today versus 7 mm on the prior exam (when remeasured). Example image 29 today. Again identified is presumed radiation change in the left upper lobe with interstitial thickening primarily centrally. No well-defined residual nodule identified in this area. Bibasilar areas of interstitial prominence are subpleural in distribution and may represent areas of mild honeycombing. Similar back to 11/23/2014. Upper abdomen: Normal imaged portions of the liver, spleen, stomach, pancreas, adrenal glands, kidneys. Abdominal aortic atherosclerosis. Musculoskeletal: Osteopenia. Status post vertebral augmentation at T4-T5. IMPRESSION: 1. Further response to therapy. Presumed radiation changes in the right upper lobe without well-defined residual nodule in this area. Other pulmonary nodules are similar and slightly decreased in size. No progressive or highly suspicious nodule identified. 2. No thoracic adenopathy. 3. Similar trace right pleural fluid. 4.  Atherosclerosis, including within the coronary arteries. 5. New small pericardial effusion. 6. Suspicion of interstitial lung disease, as detailed on 11/23/2014. This may represent an unusual appearance of usual interstitial pneumonitis , with mild isolated honeycombing at the bases. Electronically Signed   By: Abigail Miyamoto M.D.   On: 10/14/2015 14:28   Ct Angio Chest Pe W/cm &/or Wo Cm  10/16/2015  CLINICAL DATA:  Respiratory distress,  hypoxia. History of lung cancer status post radiation. EXAM: CT ANGIOGRAPHY CHEST WITH CONTRAST TECHNIQUE: Multidetector CT imaging of the chest was performed using the standard protocol during bolus administration of intravenous contrast. Multiplanar CT image reconstructions and MIPs were obtained to evaluate the vascular anatomy. CONTRAST:  71m OMNIPAQUE IOHEXOL 350 MG/ML [facility] FINDINGS: No evidence of pulmonary embolism. Mediastinum/Nodes: Cardiomegaly. Coronary atherosclerosis. Atherosclerotic calcifications of the aortic arch. Mild thoracic lymphadenopathy, including: --11 mm short axis prevascular node (series 601/image 28) --11 mm short axis right paratracheal node (series 601/ image 34) --12 mm short axis subcarinal node (series 601/ image 39) --10 mm short axis left hilar node (series 601/image 40) Visualized thyroid is unremarkable. Lungs/Pleura: No interval change from recent CT. Stable volume loss in the right middle lobe. Stable radiation changes/ scarring in the left upper lobe. Mild dependent atelectasis in the bilateral lower lobes. Underlying moderate centrilobular and paraseptal emphysematous changes. Superimposed interstitial lung disease is suspected at the lung bases. Scattered small pulmonary nodules measuring up to 5 mm, described on CT report dated 10/14/2015, less conspicuous on the current motion degraded exam. No pleural effusion or pneumothorax. Upper abdomen: Visualized upper abdomen is notable for vascular calcifications and vicarious excretion of contrast in the gallbladder. Musculoskeletal: Degenerative changes of the visualized thoracolumbar spine. Moderate loss of height at T4 with prior vertebral augmentation. Prior vertebral augmentation at T3. Review of the MIP images confirms the above findings. IMPRESSION: No evidence of pulmonary embolism. Otherwise unchanged from CT chest dated 10/14/2015. Electronically Signed   By:  SJulian HyM.D.   On: 10/16/2015 15:18   Dg Chest Port 1 View  10/16/2015  CLINICAL DATA:  Shortness of breath, respiratory distress. Home O2 sats at 60%. History of lung cancer in 2014, COPD, hypertension. EXAM: PORTABLE CHEST 1 VIEW COMPARISON:  Chest x-ray dated 05/04/2015. Comparison also to a chest CT dated 10/14/2015. FINDINGS: Mild cardiomegaly is stable. Overall cardiomediastinal silhouette is stable in size  and configuration. Again noted is a mild prominence of the central perihilar bronchovascular markings, not significantly changed compared to multiple prior studies, perhaps related to the chronic mild volume overload or chronic bronchitis. The patchy bilateral interstitial and alveolar airspace opacities are unchanged compared to multiple prior studies, again likely chronic in nature and perhaps related to the question of chronic interstitial lung disease described on recent chest CT. No new lung findings seen. No new confluent opacity to suggest a developing pneumonia. No pleural effusion. No pneumothorax seen. Osseous structures about the chest are unremarkable. IMPRESSION: 1. No acute findings.  No evidence of pneumonia. 2. Probable chronic mild volume overload/CHF or chronic bronchitic changes. Also chronic interstitial thickening which is likely related to the recent CT report questioning a chronic interstitial lung disease. Electronically Signed   By: Franki Cabot M.D.   On: 10/16/2015 13:26    CBC  Recent Labs Lab 10/18/15 0313 10/27/2015 2135 10/24/15 0450  WBC 8.7 14.7* 14.9*  HGB 11.4* 12.3 11.7*  HCT 37.2 40.8 39.3  PLT 183 152 140*  MCV 94.7 95.3 95.6  MCH 29.0 28.7 28.5  MCHC 30.6 30.1 29.8*  RDW 13.4 14.2 14.3  LYMPHSABS  --  1.4  --   MONOABS  --  0.7  --   EOSABS  --  0.1  --   BASOSABS  --  0.0  --     Chemistries   Recent Labs Lab 10/19/15 0500 10/20/15 0640 10/21/15 0612 10/18/2015 2135 10/24/15 0450  NA 139 138 139 132* 140  K 4.4 3.2* 3.6 3.9  4.7  CL 92* 90* 92* 89* 94*  CO2 35* 35* 37* 31 34*  GLUCOSE 170* 152* 119* 117* 127*  BUN 33* 29* 25* 29* 25*  CREATININE 1.09* 0.96 1.01* 1.04* 1.02*  CALCIUM 9.3 9.1 9.3 9.0 9.3  MG  --   --  2.2  --   --    ------------------------------------------------------------------------------------------------------------------ estimated creatinine clearance is 43.3 mL/min (by C-G formula based on Cr of 1.02). ------------------------------------------------------------------------------------------------------------------ No results for input(s): HGBA1C in the last 72 hours. ------------------------------------------------------------------------------------------------------------------ No results for input(s): CHOL, HDL, LDLCALC, TRIG, CHOLHDL, LDLDIRECT in the last 72 hours. ------------------------------------------------------------------------------------------------------------------ No results for input(s): TSH, T4TOTAL, T3FREE, THYROIDAB in the last 72 hours.  Invalid input(s): FREET3 ------------------------------------------------------------------------------------------------------------------ No results for input(s): VITAMINB12, FOLATE, FERRITIN, TIBC, IRON, RETICCTPCT in the last 72 hours.  Coagulation profile No results for input(s): INR, PROTIME in the last 168 hours.  No results for input(s): DDIMER in the last 72 hours.  Cardiac Enzymes No results for input(s): CKMB, TROPONINI, MYOGLOBIN in the last 168 hours.  Invalid input(s): CK ------------------------------------------------------------------------------------------------------------------ Invalid input(s): POCBNP  No results for input(s): GLUCAP in the last 72 hours.   Crislyn Willbanks M.D. [facility] 10/24/2015, 12:01 PM  Pager: [phone] Between 7am to 7pm - call Pager - [phone]  After 7pm go to [email or web address] - password [email or web address]  Call night coverage person covering after 7pm

## 2015-10-24 NOTE — ED Notes (Signed)
RT note:  Patient is aware of the importance of taking the breathing treatments.  Patient's family member states that patient takes the treatments 4 times a day or every 4 hours if needed. RT will continue to monitor.

## 2015-10-24 NOTE — H&P (Signed)
History and Physical  Patient Name: Hailey Keith     YBO:[id number]    DOB: May 13, 1935    DOA: 10/28/2015 Referring physician: Brantley Stage, MD PCP: Alesia Richards, MD      Chief Complaint: Confusion and back pain  HPI: Hailey Keith is a 81 y.o. female with a past medical history significant for COPD on home 3L O2, lung cancer who presents with altered mental status.  The patient has long-standing back pain from vertebral compression fractures treated with vertebroplasty last year. One week ago she was admitted to [facility] for COPD flare, treated with levofloxacin, steroids, bronchodilators, and also believed to be in CHF because of a slightly elevated BNP, and so diuresis with furosemide and discharged on furosemide.  Since discharge, she has been taking furosemide, but not taking anything by mouth, and with decreasing urine output. She's become increasingly confused, having clonic jerks, and tonight she was calling out in pain, seemed distraught, and appeared not to know what was going on around her.   In the ED, he was afebrile but tachycardic, tachypneic, and with normal blood pressure. She was initially slightly hypoxic down to 80% on her home oxygen.  On VBG she had pH 7.4 , PCO2 63, PO2 38, Na 132, HCO3 31, creatinine normal, WBC 14.8, BNP 1:30, urinalysis completely clear, lactic acid normal. A CT of the head was normal, and a chest x-ray showed chronic interstitial markings consistent with her known emphysema and suspected interstitial lung disease with no focal consolidation.  And TRH were asked to evaluate for admission.  There has been no new fever, and she has been taking her levofloxacin.       Review of Systems:  All other systems negative except as just noted or noted in the history of present illness.  Allergies  Allergen Reactions  . Flexeril [Cyclobenzaprine] Other (See Comments)    Mood changes   . Paxil [Paroxetine Hcl] Nausea Only    Prior to Admission  medications   Medication Sig Start Date End Date Taking? Authorizing Provider  aspirin 81 MG tablet Take 81 mg by mouth daily.   Yes Historical Provider, MD  atorvastatin (LIPITOR) 80 MG tablet TAKE 1 TABLET EVERY DAY FOR CHOLESTEROL Patient taking differently: TAKE 1 TABLET BY MOUTH ONLY ON WED AND SUN EACH WEEK FOR CHOLESTEROL 08/29/15  Yes Unk Pinto, MD  citalopram (CELEXA) 20 MG tablet Take 1 tablet (20 mg total) by mouth daily. Patient taking differently: Take 40 mg by mouth daily.  10/21/15  Yes Orson Eva, MD  doxazosin (CARDURA) 8 MG tablet TAKE 1 TABLET AT BEDTIME 06/29/15  Yes Unk Pinto, MD  furosemide (LASIX) 40 MG tablet Take 1 tablet (40 mg total) by mouth daily. 10/21/15  Yes Orson Eva, MD  guaifenesin (HUMIBID E) 400 MG TABS tablet Take 400 mg by mouth 2 (two) times daily.   Yes Historical Provider, MD  HYDROcodone-acetaminophen (NORCO) 5-325 MG tablet Take 2 tablets by mouth every 4 (four) hours as needed. Patient taking differently: Take 2 tablets by mouth every 4 (four) hours as needed for moderate pain.  10/05/15  Yes Courtney Forcucci, PA-C  ipratropium-albuterol (DUONEB) 0.5-2.5 (3) MG/3ML SOLN TAKE 3 MLS BY NEBULIZATION 4 (FOUR) TIMES DAILY. 06/09/15  Yes Unk Pinto, MD  levofloxacin (LEVAQUIN) 500 MG tablet Take 1 tablet (500 mg total) by mouth at bedtime. 10/21/15  Yes Orson Eva, MD  losartan (COZAAR) 100 MG tablet TAKE 1 TABLET BY MOUTH DAILY AS NEEDED FOR BLOOD PRESSURE  Patient taking differently: TAKE 1 TABLET BY MOUTH DAILY IN MORNING FOR BLOOD PRESSURE 09/11/15  Yes Unk Pinto, MD  oxyCODONE-acetaminophen (PERCOCET/ROXICET) 5-325 MG per tablet Take two tablets by mouth every four hours as needed for pain. Do not exceed 4gms of Tylenol in 24 hours Patient taking differently: Take 2 tablets by mouth every 4 (four) hours as needed for moderate pain. Take two tablets by mouth every four hours as needed for pain. Do not exceed 4gms of Tylenol in 24 hours 05/06/15   Yes Theodis Blaze, MD  potassium chloride (K-DUR,KLOR-CON) 10 MEQ tablet Take 1 tablet (10 mEq total) by mouth daily. 10/21/15  Yes Orson Eva, MD  predniSONE (DELTASONE) 10 MG tablet Take 5 tablets (50 mg total) by mouth daily with breakfast. And decrease by 1 tablet daily 10/22/15  Yes Orson Eva, MD  PROAIR HFA 108 (90 BASE) MCG/ACT inhaler 1 TO 2 INHALATIONS EVERY 4 HOURS AS NEEDED FOR RESCUE ASTHMA 04/18/15  Yes Vicie Mutters, PA-C  Vitamin D, Ergocalciferol, (DRISDOL) 50000 UNITS CAPS capsule TAKE ONE CAPSULE EVERY DAY Patient taking differently: TAKE ONE CAPSULE BY MOUTH EVERY WED AND SUN 06/01/15  Yes Vicie Mutters, PA-C    Past Medical History  Diagnosis Date  . Hypertension   . Dyslipidemia   . Hard of hearing   . Hx of radiation therapy 06/18/13- 07/23/13    LUL lung primary, 7020 cGy 26 sessions  . COPD (chronic obstructive pulmonary disease) ([place])   . prediabetes   . Lung cancer ([place]) 05/20/13    LUL, squamous cell  . Skin cancer     basal cell  . CHF (congestive heart failure) [facility])     Past Surgical History  Procedure Laterality Date  . Tonsillectomy      as child, repeat surgery early 20's  . Mohs surgery      "several times"  . Facial reconstruction surgery  25 years ago    to remove skin cancer, right side of face    Family history: family history includes Cancer in her sister; Heart attack in her father; Lung cancer in her sister; Pneumonia in her mother.  Social History: Patient lives with her daughter, who reports that "she is my best friend" and is her POA. The patient is from [place] [facility] originally. She worked in a [employer] fund[employer] for many years. She is a former smoker.   She does not use alcohol per daughter.     Physical Exam: BP 165/83 mmHg  Pulse 100  Temp(Src) 98.6 F (37 C) (Axillary)  Resp 33  SpO2 83% General appearance: Frail elderly  female, awake and responsive and in no acute distress, but uncomrtable and tachypneic, pursed lip breathing.     Eyes: Anicteric, conjunctiva pink, lids and lashes normal.     ENT: No nasal deformity, discharge, or epistaxis.  MM very dry.   Lymph: No cervical or supraclavicular lymphadenopathy. Skin: Warm and dry.  No rashes on left or right flank or breast where pain is located. Cardiac: Tachycardia, nl S1-S2, no murmurs appreciated.  Capillary refill is brisk.  JVP not visible.  No LE edema.  Radial pulses 2+ and symmetric. Respiratory: Tachypneic pursed lip breathing, inspiratory and expiratory wheezes are heard, no rales, but very poor air movement. Abdomen: Abdomen soft without rigidity.  No TTP. No ascites, distension.   MSK: No deformities or effusions.  There is no vertebral tenderness. There is no flank tenderness when I press on her left flank where she indicates  her pain. Neuro: Blurts/shouts out occasionally.  Occasional clonic jerks, no asterixis.  Not oriented to place or time, but able to answer questions, just delayed and speech is fluent.  Moves all extremities equally and with normal coordination. Globally weak but symmetric and able to sit up. Psych: No evidence of aural or visual hallucinations or delusions.       Labs on Admission:  The metabolic panel shows hyponatremia, normal  potassium, renal function. Elevated BUN to creatinine ratio. Urinalysis shows no pyuria or hematuria. The BNP is elevated but minimally. The lactic acid level is normal. The complete blood count shows  leukocytosis, no anemia or thrombocytopenia.   Radiological Exams on Admission: Personally reviewed: Dg Chest 2 View 10/26/2015  Interstitial markings, which correlate with patchy fibrosis seen on CT going back two years.  No pulmonary edema.     Ct Head Wo Contrast 10/08/2015 [facility]  EKG: Independently reviewed. Sinus rhythm, rate 99, QTC 456, no ST or T-wave changes.    Echocardiogram 1 week ago: Shows only grade 1 diastolic dysfunction.         Assessment/Plan 1. AMS:  This is new.   Multifactorial.  Minimal MCI at baseline, if anything.  Suspect this acute delirium is due to dehydration (patient was discharged with new prescription for furosemide and has had minimal PO intake per daughter), and hypercarbia, possibly also pain and/or hyponatremia.  No pneumonia by CXR and UA clear.  Infection is doubted, at present.   -BiPAP now -Repeat ABG in 4 hours -Admit to stepdown -Fluid bolus now and encourage PO intake -Monitor BMP   2. Back pain:  This is new.  This is flank pain, burning in character, not responsive to opioids. -Acetaminophen 1000 mg TID scheduled -Avoid morphine to the extent possible, unless pain continues to be poorly controlled.  3. COPD, emphysemia with chronic hypoxic and hypercapnic respiratory failure:  Goal SpO2 88-92% -Consult to Palliative Care -Continue home Duonebs q6hrs  4. History of Lung CA:   5. HTN:  Hypertensive at admission. -Continue home doxazosin and losartan -Discontinue furosemide and K  6. Leukocytosis:  Suspect this is from prednisone burst/taper. -Trend CBC and culture if febrile  7. Hyponatremia:  -Check serum and urine osmolality -Fluids as above     DVT PPx: Lovenox Diet: Regular Consultants: None Code Status: DO NOT RESUSCITATE Family Communication: Daughter, present at bedside.    CoDE STATUS confirmed.   Medical decision making: What exists of the patient's previous chart was reviewed in depth and the case was discussed with Dr. Oleta Mouse. Patient seen 1:46 AM on 10/24/2015. w Disposition Plan:  I recommend admission to step down for BiPAP.  Clinical condition: stable.  Anticipate trend pCO2 on BipAP.  Fluids and monitor clniical status.       Edwin Dada [facility] Pager [phone]

## 2015-10-24 NOTE — ED Notes (Addendum)
Pt removed Bipap mask, refuses to allow this RN to replace mask.  O2 sats 83%.  Pt placed on NRB at 10L, RT notified.

## 2015-10-25 DIAGNOSIS — M549 Dorsalgia, unspecified: Secondary | ICD-10-CM | POA: Insufficient documentation

## 2015-10-25 DIAGNOSIS — M546 Pain in thoracic spine: Secondary | ICD-10-CM

## 2015-10-25 DIAGNOSIS — Z515 Encounter for palliative care: Secondary | ICD-10-CM

## 2015-10-25 LAB — BASIC METABOLIC PANEL
Anion gap: 7 (ref 5–15)
BUN: 17 mg/dL (ref 6–20)
CALCIUM: 8.9 mg/dL (ref 8.9–10.3)
CO2: 35 mmol/L — ABNORMAL HIGH (ref 22–32)
Chloride: 96 mmol/L — ABNORMAL LOW (ref 101–111)
Creatinine, Ser: 0.81 mg/dL (ref 0.44–1.00)
GFR calc Af Amer: >60 mL/min (ref >60)
GLUCOSE: 106 mg/dL — AB (ref 65–99)
Potassium: 3.4 mmol/L — ABNORMAL LOW (ref 3.5–5.1)
Sodium: 138 mmol/L (ref 135–145)

## 2015-10-25 LAB — CBC
HEMATOCRIT: 37.2 % (ref 36.0–46.0)
Hemoglobin: 11.3 g/dL — ABNORMAL LOW (ref 12.0–15.0)
MCH: 28.7 pg (ref 26.0–34.0)
MCHC: 30.4 g/dL (ref 30.0–36.0)
MCV: 94.4 fL (ref 78.0–100.0)
Platelets: 168 10*3/uL (ref 150–400)
RBC: 3.94 MIL/uL (ref 3.87–5.11)
RDW: 14 % (ref 11.5–15.5)
WBC: 12.5 10*3/uL — ABNORMAL HIGH (ref 4.0–10.5)

## 2015-10-25 LAB — BLOOD GAS, ARTERIAL
ACID-BASE EXCESS: 11.6 mmol/L — AB (ref 0.0–2.0)
Bicarbonate: 36.9 mEq/L — ABNORMAL HIGH (ref 20.0–24.0)
Drawn by: 270221
O2 CONTENT: 3 L/min
O2 Saturation: 97.3 %
PATIENT TEMPERATURE: 98.3
PCO2 ART: 60.7 mmHg — AB (ref 35.0–45.0)
PO2 ART: 97.4 mmHg (ref 80.0–100.0)
TCO2: 38.8 mmol/L (ref 0–100)
pH, Arterial: 7.399 (ref 7.350–7.450)

## 2015-10-25 LAB — BRAIN NATRIURETIC PEPTIDE: B Natriuretic Peptide: 225.1 pg/mL — ABNORMAL HIGH (ref 0.0–100.0)

## 2015-10-25 LAB — AMMONIA: Ammonia: 31 umol/L (ref 9–35)

## 2015-10-25 MED ORDER — LEVALBUTEROL HCL 0.63 MG/3ML IN NEBU
0.6300 mg | INHALATION_SOLUTION | RESPIRATORY_TRACT | Status: DC
  Administered 2015-10-25 – 2015-10-29 (×15): 0.63 mg via RESPIRATORY_TRACT
  Filled 2015-10-25 (×21): qty 3

## 2015-10-25 MED ORDER — TRAZODONE HCL 50 MG PO TABS: 50.0000 mg | ORAL_TABLET | Freq: Every evening | ORAL | Status: DC | PRN

## 2015-10-25 MED ORDER — BUDESONIDE 0.25 MG/2ML IN SUSP
0.2500 mg | Freq: Two times a day (BID) | RESPIRATORY_TRACT | Status: DC
  Administered 2015-10-25 – 2015-10-29 (×9): 0.25 mg via RESPIRATORY_TRACT
  Filled 2015-10-25 (×11): qty 2

## 2015-10-25 MED ORDER — POTASSIUM CHLORIDE CRYS ER 20 MEQ PO TBCR
40.0000 meq | EXTENDED_RELEASE_TABLET | Freq: Once | ORAL | Status: AC
  Administered 2015-10-25: 40 meq via ORAL
  Filled 2015-10-25: qty 2

## 2015-10-25 MED ORDER — HALOPERIDOL LACTATE 5 MG/ML IJ SOLN
1.0000 mg | INTRAMUSCULAR | Status: DC | PRN
  Administered 2015-10-25 – 2015-10-26 (×2): 1 mg via INTRAVENOUS
  Filled 2015-10-25: qty 1

## 2015-10-25 MED ORDER — ARFORMOTEROL TARTRATE 15 MCG/2ML IN NEBU
15.0000 ug | INHALATION_SOLUTION | Freq: Two times a day (BID) | RESPIRATORY_TRACT | Status: DC
  Administered 2015-10-25 – 2015-10-29 (×7): 15 ug via RESPIRATORY_TRACT
  Filled 2015-10-25 (×9): qty 2

## 2015-10-25 MED ORDER — FUROSEMIDE 40 MG PO TABS
40.0000 mg | ORAL_TABLET | Freq: Every day | ORAL | Status: DC
  Administered 2015-10-25 – 2015-10-27 (×2): 40 mg via ORAL
  Filled 2015-10-25 (×3): qty 1

## 2015-10-25 MED ORDER — LEVALBUTEROL HCL 0.63 MG/3ML IN NEBU
0.6300 mg | INHALATION_SOLUTION | RESPIRATORY_TRACT | Status: DC
Start: 2015-10-25 — End: 2015-10-25
  Administered 2015-10-25 (×2): 0.63 mg via RESPIRATORY_TRACT
  Filled 2015-10-25 (×2): qty 3

## 2015-10-25 MED ORDER — IPRATROPIUM BROMIDE 0.02 % IN SOLN
0.5000 mg | RESPIRATORY_TRACT | Status: DC
  Administered 2015-10-25 (×2): 0.5 mg via RESPIRATORY_TRACT
  Filled 2015-10-25 (×2): qty 2.5

## 2015-10-25 MED ORDER — HALOPERIDOL LACTATE 5 MG/ML IJ SOLN
2.0000 mg | INTRAMUSCULAR | Status: DC | PRN
  Administered 2015-10-25: 2 mg via INTRAVENOUS
  Filled 2015-10-25: qty 1

## 2015-10-25 MED ORDER — OXYCODONE HCL 5 MG PO TABS
5.0000 mg | ORAL_TABLET | ORAL | Status: DC | PRN
  Administered 2015-10-25: 5 mg via ORAL
  Filled 2015-10-25: qty 1

## 2015-10-25 MED ORDER — IPRATROPIUM BROMIDE 0.02 % IN SOLN
0.5000 mg | RESPIRATORY_TRACT | Status: DC
  Administered 2015-10-25 – 2015-10-29 (×16): 0.5 mg via RESPIRATORY_TRACT
  Filled 2015-10-25 (×21): qty 2.5

## 2015-10-25 MED ORDER — LEVOFLOXACIN IN D5W 750 MG/150ML IV SOLN
750.0000 mg | INTRAVENOUS | Status: DC
  Administered 2015-10-26 – 2015-10-28 (×3): 750 mg via INTRAVENOUS
  Filled 2015-10-25 (×3): qty 150

## 2015-10-25 MED ORDER — METHYLPREDNISOLONE SODIUM SUCC 125 MG IJ SOLR
60.0000 mg | Freq: Three times a day (TID) | INTRAMUSCULAR | Status: DC
  Administered 2015-10-25 – 2015-10-27 (×7): 60 mg via INTRAVENOUS
  Filled 2015-10-25 (×8): qty 2

## 2015-10-25 MED ORDER — MORPHINE SULFATE (PF) 2 MG/ML IV SOLN
1.0000 mg | INTRAVENOUS | Status: DC | PRN
  Administered 2015-10-25 – 2015-10-26 (×6): 1 mg via INTRAVENOUS
  Filled 2015-10-25 (×5): qty 1

## 2015-10-25 MED ORDER — HALOPERIDOL LACTATE 5 MG/ML IJ SOLN
2.0000 mg | Freq: Four times a day (QID) | INTRAMUSCULAR | Status: DC
  Administered 2015-10-25 – 2015-10-27 (×5): 2 mg via INTRAVENOUS
  Filled 2015-10-25 (×5): qty 1

## 2015-10-25 MED ORDER — LEVOFLOXACIN 500 MG PO TABS
500.0000 mg | ORAL_TABLET | ORAL | Status: DC
  Filled 2015-10-25 (×2): qty 1

## 2015-10-25 NOTE — Care Management Note (Signed)
Case Management Note  Patient Details  Name: Hailey Keith MRN: [id number] Date of Birth: May 31, 1935  Subjective/Objective:     Adm w resp distress               Action/Plan: lives w fam, pcp dr Melford Aase, act w [facility] prior to adm.   Expected Discharge Date:                  Expected Discharge Plan:  [place]  In-House Referral:     Discharge planning Services  CM Consult  Post Acute Care Choice:  Resumption of Svcs/PTA Provider Choice offered to:     DME Arranged:    DME Agency:     HH Arranged:  RN [place] Agency:  [place]  Status of Service:     Medicare Important Message Given:    Date Medicare IM Given:    Medicare IM give by:    Date Additional Medicare IM Given:    Additional Medicare Important Message give by:     If discussed at [place] of Stay Meetings, dates discussed:    Additional Comments: [facility] called to alert cm that they follow pt at home. Chart states has home o2  [name], [place] 10/25/2015, 2:21 PM

## 2015-10-25 NOTE — Progress Notes (Signed)
CO2 of 60.7 called to Dr Tana Coast; attempting BIPAP but patient constantly trying to remove mask. Daughter at bedside.

## 2015-10-25 NOTE — Progress Notes (Signed)
[facility]                                                                              Patient Demographics  Hailey Keith, is a 81 y.o. female, DOB - April 25, 1935, VOP:[id number]  Admit date - 10/12/2015   Admitting Physician Hailey Dada, MD  Outpatient Primary MD for the patient is Hailey Richards, MD  LOS - 1   Chief Complaint  Patient presents with  . Shortness of Breath       Brief HPI   Hailey Keith is a 81 y.o. female with a past medical history significant for COPD on home 3L O2, lung cancer who presented with altered mental status. The patient has long-standing back pain from vertebral compression fractures treated with vertebroplasty last year. One week ago she was admitted to [facility] for COPD flare, treated with levofloxacin, steroids, bronchodilators, and also believed to be in CHF because of a slightly elevated BNP, and so diuresis with furosemide and discharged on furosemide. Since discharge, she has been taking furosemide, but not taking anything by mouth, and with decreasing urine output. She's become increasingly confused, having clonic jerks, and tonight she was calling out in pain, seemed distraught, and appeared not to know what was going on around her.  In the ED, he was afebrile but tachycardic, tachypneic, and with normal blood pressure. She was initially slightly hypoxic down to 80% on her home oxygen. On VBG she had pH 7.4 , PCO2 63, PO2 38, Na 132, HCO3 31, creatinine normal, WBC 14.8, BNP 1:30, urinalysis completely clear, lactic acid normal. A CT of the head was normal, and a chest x-ray showed chronic interstitial markings consistent with her known emphysema and suspected interstitial lung disease with no focal consolidation.   Patient was admitted to stepdown unit, her mental status did improve with BiPAP briefly however patient refused to wear BiPAP once in stepdown.  This morning 2/21, patient appears to be more  confused, rambling, oriented to self only. ABG shows hypercarbic respiratory failure, starting BiPAP again. Palliative medicine also consulted for GU see.  Assessment & Plan    Principal Problem:   Acute encephalopathy: Multifactorial, possibly due to hypercarbic respiratory failure, hyponatremia, narcotics. No pneumonia by CXR and UA clear.Patient had been taking a lot of pain medications (daughter's medications) prior to admission. - Patient seen and examined this morning, appeared to be confused, had been refusing BiPAP yesterday - Stat ABG and ammonia level ordered. ABG 7.39/60.7/97.4/36/ 97%, ammonia level 31 - will place on BiPAP again, patient continues to fight BiPAP, ordered morphine 1 mg, palliative medicine also following today - CT  head showed no acute infarct or hemorrhage  - UA negative for UTI   COPD, emphysemia with acute on chronic hypoxic and hypercapnic respiratory failure:  Goal SpO2 88-92% with wheezing - As #1, place on BiPAP - Placed on Pulmicort, Brovana, Xopenex/ atrovent scheduled nebs every 4 hours, continue IV Solu-Medrol,  - Changed to IV Levaquin -Consult to Palliative Care  C acute on hronic Back pain: Has history of compression fractures, likely has new T5 compression fracture - Lumbar spine x-ray showed no  acute fractures -Thoracic spine x-ray showed stable prior vertebroplasty T3 and T4, moderate compression deformity at T5 vertebral body, new - Obtain MRI of the thoracic spine once patient is stable with her respiratory status     History of squamous cell Lung CA: history of stage IIB non-small cell lung cancer, squamous cell carcinoma status post curative radiotherapy to the left upper lobe lung mass with partial response. - Outpatient follow-up with Dr. Earlie Keith, last seen by Dr. Earlie Keith in 10/16, recommended repeat CT of the chest in 4 months - CT angiogram chest on 2/12 showed no PE, no change from prior CT   HTN:  Hypertensive at  admission. -Continue home doxazosin and losartan -Obtain BNP, patient takes Lasix 40 mg daily at home, restart    Leukocytosis:  Suspect this is from prednisone burst/taper. -Trend CBC and culture if febrile   Hyponatremia:  Resolved, DC IV fluids   Code Status DO NOT RESUSCITATE  Family Communication: no family member at the bedside   Disposition Plan: follow in SDU  Time Spent in minutes   25 mins   Procedures  Chest x-ray   Consults   (Palliative medicine   DVT Prophylaxis  Lovenox   Medications  Scheduled Meds: . acetaminophen  1,000 mg Oral TID  . arformoterol  15 mcg Nebulization BID  . aspirin EC  81 mg Oral Daily  . atorvastatin  80 mg Oral q1800  . budesonide (PULMICORT) nebulizer solution  0.25 mg Nebulization BID  . citalopram  20 mg Oral Daily  . doxazosin  8 mg Oral QHS  . enoxaparin (LOVENOX) injection  40 mg Subcutaneous Q24H  . guaiFENesin  400 mg Oral BID  . levalbuterol  0.63 mg Nebulization Q4H   And  . ipratropium  0.5 mg Nebulization Q4H  . levofloxacin  500 mg Oral Q24H  . losartan  50 mg Oral Daily  . methylPREDNISolone (SOLU-MEDROL) injection  60 mg Intravenous Q8H  . sodium chloride flush  3 mL Intravenous Q12H   Continuous Infusions:   PRN Meds:.   Antibiotics   Anti-infectives    Start     Dose/Rate Route Frequency Ordered Stop   10/25/15 0800  levofloxacin (LEVAQUIN) tablet 500 mg     500 mg Oral Every 24 hours 10/25/15 0735     10/24/15 0700  levofloxacin (LEVAQUIN) tablet 500 mg  Status:  Discontinued     500 mg Oral Daily at bedtime 10/24/15 0655 10/25/15 0735        Subjective:   Hailey Keith was seen and examined today. Patient confused, oriented to self only, per RN, patient was refusing to wear BiPAP   Objective:   Filed Vitals:   10/25/15 0200 10/25/15 0201 10/25/15 0745 10/25/15 0942  BP: 154/79  160/75   Pulse:   104 110  Temp:   98.3 F (36.8 C)   TempSrc:   Oral   Resp: 24  21 35  Height:       Weight:      SpO2:  98% 98%    No intake or output data in the 24 hours ending 10/25/15 1022   Wt Readings from Last 3 Encounters:  10/24/15 71.5 kg (157 lb 10.1 oz)  10/21/15 72.349 kg (159 lb 8 oz)  10/05/15 76.204 kg (168 lb)     Exam  General: Confused, oriented to self, NAD  HEENT:  PERRLA, EOMI,  Neck:  CVS: S1 S2 clear, sinus tachycardia  Respiratory: Diffuse wheezing bilaterally  Abdomen : Soft, nontender, nondistended, + bowel sounds  Ext: no cyanosis clubbing or edema  Neuro: confused not following commands   Skin: No rashes  Psychconfused   Data Review   Micro Results Recent Results (from the past 240 hour(s))  Blood culture (routine x 2)     Status: None   Collection Time: 10/16/15  1:35 PM  Result Value Ref Range Status   Specimen Description BLOOD RIGHT ANTECUBITAL  Final   Special Requests BOTTLES DRAWN AEROBIC AND ANAEROBIC 5CC  Final   Culture NO GROWTH 5 DAYS  Final   Report Status 10/21/2015 FINAL  Final  Blood culture (routine x 2)     Status: None   Collection Time: 10/16/15  1:41 PM  Result Value Ref Range Status   Specimen Description BLOOD LEFT ANTECUBITAL  Final   Special Requests BOTTLES DRAWN AEROBIC AND ANAEROBIC 5CC  Final   Culture NO GROWTH 5 DAYS  Final   Report Status 10/21/2015 FINAL  Final  MRSA PCR Screening     Status: None   Collection Time: 10/16/15  8:57 PM  Result Value Ref Range Status   MRSA by PCR NEGATIVE NEGATIVE Final    Comment:        The GeneXpert MRSA Assay (FDA approved for NASAL specimens only), is one component of a comprehensive MRSA colonization surveillance program. It is not intended to diagnose MRSA infection nor to guide or monitor treatment for MRSA infections.   Culture, sputum-assessment     Status: None   Collection Time: 10/17/15  4:27 PM  Result Value Ref Range Status   Specimen Description SPUTUM  Final   Special Requests NONE  Final   Sputum evaluation   Final    THIS  SPECIMEN IS ACCEPTABLE. RESPIRATORY CULTURE REPORT TO FOLLOW.   Report Status 10/18/2015 FINAL  Final  Culture, respiratory (NON-Expectorated)     Status: None   Collection Time: 10/17/15  4:27 PM  Result Value Ref Range Status   Specimen Description EXPECTORATED SPUTUM  Final   Special Requests NONE  Final   Gram Stain   Final    ABUNDANT WBC PRESENT,BOTH PMN AND MONONUCLEAR FEW SQUAMOUS EPITHELIAL CELLS PRESENT MODERATE GRAM VARIABLE ROD FEW YEAST Performed at [facility]    Culture   Final    NORMAL OROPHARYNGEAL FLORA Performed at [facility]    Report Status 10/21/2015 FINAL  Final    Radiology Reports Dg Chest 2 View  10/12/2015  CLINICAL DATA:  Initial evaluation for acute shortness of breath for 1 day. EXAM: CHEST  2 VIEW COMPARISON:  Prior study from 10/16/2015. FINDINGS: Cardiac and mediastinal silhouettes are stable in size and contour, and remain within normal limits. Lung volumes are mildly reduced, similar to prior. Prominence of the central perihilar vasculature without overt pulmonary edema. Patchy bilateral interstitial and alveolar airspace opacities are also similar to previous, likely chronic in nature, again possibly representing underlying interstitial lung disease as seen on prior CTs. No pulmonary edema or pleural effusion. No pneumothorax. No acute osseus abnormality. Scattered multilevel degenerative changes within the visualized spine. Sequela with vertebral augmentation within the upper thoracic spine, stable. IMPRESSION: 1. No active cardiopulmonary disease. 2. Scattered patchy and interstitial opacities, similar to multiple previous studies, and likely chronic in nature. Again, these changes likely reflect underlying COPD and possibly underlying interstitial lung disease as seen on prior CTs. Electronically Signed   By: Jeannine Boga M.D.   On: 10/29/2015 21:11   Dg  Thoracic Spine 2 View  10/24/2015  CLINICAL DATA:  Low back pain,  bilateral shoulder pain EXAM: THORACIC SPINE 2 VIEWS COMPARISON:  10/12/2015 FINDINGS: Two views of thoracic spine submitted. Alignment and disc spaces are preserved. Stable compression deformity and prior vertebroplasty at T3 and T4 level. There is moderate compression deformity upper endplate of T5 vertebral body new from prior exam. Clinical correlation is necessary. IMPRESSION: Stable compression deformity and prior vertebroplasty at T3 and T4 level. There is moderate compression deformity upper endplate of T5 vertebral body new from prior exam. Clinical correlation is necessary. Electronically Signed   By: Lahoma Crocker M.D.   On: 10/24/2015 13:29   Dg Lumbar Spine 2-3 Views  10/24/2015  CLINICAL DATA:  81 year old female with low back pain. EXAM: LUMBAR SPINE - 2-3 VIEW COMPARISON:  CT dated 12/25/2014 FINDINGS: No definite acute fracture or subluxation. There is osteopenia with stable appearing old compression fracture deformity of the inferior endplate of the L2 vertebra as seen on the prior CT. There is stable appearing retropulsion similar to the prior study. There is multilevel degenerative changes. The visualized spinous and transverse processes appear intact. There is atherosclerotic calcification of the aorta. IMPRESSION: No acute fracture or subluxation. Osteopenia with stable compression deformity of the L2 vertebra. Electronically Signed   By: Anner Crete M.D.   On: 10/24/2015 13:32   Ct Head Wo Contrast  10/24/2015  CLINICAL DATA:  Confusion and altered mental status. EXAM: CT HEAD WITHOUT CONTRAST TECHNIQUE: Contiguous axial images were obtained from the base of the skull through the vertex without intravenous contrast. COMPARISON:  None. FINDINGS: Mild atrophy. Moderate chronic small vessel ischemia.No intracranial hemorrhage, mass effect, or midline shift. No hydrocephalus. The basilar cisterns are patent. No evidence of territorial infarct. No intracranial fluid collection. Calvarium is  intact. Included paranasal sinuses are well aerated. Postsurgical change versus hypoplasia of right mastoid air cells. IMPRESSION: Atrophy and chronic small vessel ischemia without acute intracranial abnormality. Electronically Signed   By: Jeb Levering M.D.   On: 10/22/2015 23:04   Ct Chest W Contrast  10/14/2015  CLINICAL DATA:  Lung cancer diagnosed in 2014 with radiation therapy complete. Cough and shortness of breath. Restaging. EXAM: CT CHEST WITH CONTRAST TECHNIQUE: Multidetector CT imaging of the chest was performed during intravenous contrast administration. CONTRAST:  25m OMNIPAQUE IOHEXOL 300 MG/ML  SOLN COMPARISON:  03/23/2015. FINDINGS: Mediastinum/Nodes: No supraclavicular adenopathy. Advanced aortic and branch vessel atherosclerosis. Mild cardiomegaly with a new small pericardial effusion. Multivessel coronary artery atherosclerosis. No central pulmonary embolism, on this non-dedicated study. No mediastinal or hilar adenopathy. Lungs/Pleura: Similar trace right pleural fluid. Advanced centrilobular emphysema. Probable secretions in the non dependent trachea. Minimal motion degradation inferiorly. 4 mm right upper lobe pulmonary nodule on image 21/series 5 is unchanged. Soft tissue thickening along the right minor fissure measures maximally 1.8 x 1.2 cm on image 37/series 5. 1.8 x 1.7 cm on the prior. Favored to represent a volume loss rather than a true nodule. left upper lobe pulmonary nodule which measures 5 mm on image 25/ series 5, similar. Posterior left upper lobe irregular nodule measures maximally 8 mm on image 15/series 5, similar. Left lower lobe nodule measures 5 mm today versus 7 mm on the prior exam (when remeasured). Example image 29 today. Again identified is presumed radiation change in the left upper lobe with interstitial thickening primarily centrally. No well-defined residual nodule identified in this area. Bibasilar areas of interstitial prominence are subpleural in  distribution and may  represent areas of mild honeycombing. Similar back to 11/23/2014. Upper abdomen: Normal imaged portions of the liver, spleen, stomach, pancreas, adrenal glands, kidneys. Abdominal aortic atherosclerosis. Musculoskeletal: Osteopenia. Status post vertebral augmentation at T4-T5. IMPRESSION: 1. Further response to therapy. Presumed radiation changes in the right upper lobe without well-defined residual nodule in this area. Other pulmonary nodules are similar and slightly decreased in size. No progressive or highly suspicious nodule identified. 2. No thoracic adenopathy. 3. Similar trace right pleural fluid. 4.  Atherosclerosis, including within the coronary arteries. 5. New small pericardial effusion. 6. Suspicion of interstitial lung disease, as detailed on 11/23/2014. This may represent an unusual appearance of usual interstitial pneumonitis , with mild isolated honeycombing at the bases. Electronically Signed   By: Abigail Miyamoto M.D.   On: 10/14/2015 14:28   Ct Angio Chest Pe W/cm &/or Wo Cm  10/16/2015  CLINICAL DATA:  Respiratory distress, hypoxia. History of lung cancer status post radiation. EXAM: CT ANGIOGRAPHY CHEST WITH CONTRAST TECHNIQUE: Multidetector CT imaging of the chest was performed using the standard protocol during bolus administration of intravenous contrast. Multiplanar CT image reconstructions and MIPs were obtained to evaluate the vascular anatomy. CONTRAST:  62m OMNIPAQUE IOHEXOL 350 MG/ML [facility]N [facility] FINDINGS: No evidence of pulmonary embolism. Mediastinum/Nodes: Cardiomegaly. Coronary atherosclerosis. Atherosclerotic calcifications of the aortic arch. Mild thoracic lymphadenopathy, including: --11 mm short axis prevascular node (series 601/image 28) --11 mm short axis right paratracheal node (series 601/ image 34) --12 mm short axis subcarinal node (series 601/ image 39) --10 mm short axis left hilar node (series  601/image 40) Visualized thyroid is unremarkable. Lungs/Pleura: No interval change from recent CT. Stable volume loss in the right middle lobe. Stable radiation changes/ scarring in the left upper lobe. Mild dependent atelectasis in the bilateral lower lobes. Underlying moderate centrilobular and paraseptal emphysematous changes. Superimposed interstitial lung disease is suspected at the lung bases. Scattered small pulmonary nodules measuring up to 5 mm, described on CT report dated 10/14/2015, less conspicuous on the current motion degraded exam. No pleural effusion or pneumothorax. Upper abdomen: Visualized upper abdomen is notable for vascular calcifications and vicarious excretion of contrast in the gallbladder. Musculoskeletal: Degenerative changes of the visualized thoracolumbar spine. Moderate loss of height at T4 with prior vertebral augmentation. Prior vertebral augmentation at T3. Review of the MIP images confirms the above findings. IMPRESSION: No evidence of pulmonary embolism. Otherwise unchanged from CT chest dated 10/14/2015. Electronically Signed   By: SJulian HyM.D.   On: 10/16/2015 15:18   Dg Chest Port 1 View  10/16/2015  CLINICAL DATA:  Shortness of breath, respiratory distress. Home O2 sats at 60%. History of lung cancer in 2014, COPD, hypertension. EXAM: PORTABLE CHEST 1 VIEW COMPARISON:  Chest x-ray dated 05/04/2015. Comparison also to a chest CT dated 10/14/2015. FINDINGS: Mild cardiomegaly is stable. Overall cardiomediastinal silhouette is stable in size and configuration. Again noted is a mild prominence of the central perihilar bronchovascular markings, not significantly changed compared to multiple prior studies, perhaps related to the chronic mild volume overload or chronic bronchitis. The patchy bilateral interstitial and alveolar airspace opacities are unchanged compared to multiple prior studies, again likely chronic in nature and perhaps related to the question of chronic  interstitial lung disease described on recent chest CT. No new lung findings seen. No new confluent opacity to suggest a developing pneumonia. No pleural effusion. No pneumothorax seen. Osseous structures about the chest are unremarkable. IMPRESSION: 1. No acute  findings.  No evidence of pneumonia. 2. Probable chronic mild volume overload/CHF or chronic bronchitic changes. Also chronic interstitial thickening which is likely related to the recent CT report questioning a chronic interstitial lung disease. Electronically Signed   By: Franki Cabot M.D.   On: 10/16/2015 13:26    CBC  Recent Labs Lab 10/29/2015 2135 10/24/15 0450 10/24/15 1702 10/25/15 0208  WBC 14.7* 14.9* 12.9* 12.5*  HGB 12.3 11.7* 11.9* 11.3*  HCT 40.8 39.3 39.2 37.2  PLT 152 140* 156 168  MCV 95.3 95.6 94.9 94.4  MCH 28.7 28.5 28.8 28.7  MCHC 30.1 29.8* 30.4 30.4  RDW 14.2 14.3 14.0 14.0  LYMPHSABS 1.4  --   --   --   MONOABS 0.7  --   --   --   EOSABS 0.1  --   --   --   BASOSABS 0.0  --   --   --     Chemistries   Recent Labs Lab 10/20/15 0640 10/21/15 0612 10/05/2015 2135 10/24/15 0450 10/25/15 0208  NA 138 139 132* 140 138  K 3.2* 3.6 3.9 4.7 3.4*  CL 90* 92* 89* 94* 96*  CO2 35* 37* 31 34* 35*  GLUCOSE 152* 119* 117* 127* 106*  BUN 29* 25* 29* 25* 17  CREATININE 0.96 1.01* 1.04* 1.02* 0.81  CALCIUM 9.1 9.3 9.0 9.3 8.9  MG  --  2.2  --   --   --    ------------------------------------------------------------------------------------------------------------------ estimated creatinine clearance is 54.2 mL/min (by C-G formula based on Cr of 0.81). ------------------------------------------------------------------------------------------------------------------ No results for input(s): HGBA1C in the last 72 hours. ------------------------------------------------------------------------------------------------------------------ No results for input(s): CHOL, HDL, LDLCALC, TRIG, CHOLHDL, LDLDIRECT in the  last 72 hours. ------------------------------------------------------------------------------------------------------------------ No results for input(s): TSH, T4TOTAL, T3FREE, THYROIDAB in the last 72 hours.  Invalid input(s): FREET3 ------------------------------------------------------------------------------------------------------------------ No results for input(s): VITAMINB12, FOLATE, FERRITIN, TIBC, IRON, RETICCTPCT in the last 72 hours.  Coagulation profile No results for input(s): INR, PROTIME in the last 168 hours.  No results for input(s): DDIMER in the last 72 hours.  Cardiac Enzymes No results for input(s): CKMB, TROPONINI, MYOGLOBIN in the last 168 hours.  Invalid input(s): CK ------------------------------------------------------------------------------------------------------------------ Invalid input(s): POCBNP  No results for input(s): GLUCAP in the last 72 hours.   RAI,RIPUDEEP M.D. [facility] 10/25/2015, 10:22 AM  Pager: [phone] Between 7am to 7pm - call Pager - [phone]  After 7pm go to [email or web address] - password [email or web address]  Call night coverage person covering after 7pm

## 2015-10-25 NOTE — Progress Notes (Signed)
Utilization Review Completed.Donne Anon T2/21/2017

## 2015-10-25 NOTE — Consult Note (Signed)
Consultation Note Date: 10/25/2015   Patient Name: Hailey Keith  DOB: Nov 01, 1934  MRN: [id number]  Age / Sex: 81 y.o., female  PCP: Hailey Pinto, MD Referring Physician: Mendel Corning, MD  Reason for Consultation: Establishing goals of care, Non pain symptom management, Pain control and Psychosocial/spiritual support   Clinical Assessment/Narrative:  [name] is a 81 y.o. female with a past medical history significant for COPD on home 3L O2, lung cancer who presents with altered mental status.  The patient has long-standing back pain from vertebral compression fractures treated with vertebroplasty last year. One week ago she was admitted to hospital for COPD flare, treated with levofloxacin, steroids, bronchodilators, and also believed to be in CHF because of a slightly elevated BNP, and so diuresis with furosemide and discharged on furosemide.  Continued physical, functional and cognitive decline since discharge/ per her daughter she had poor po intake,  increased confusion,  clonic jerks.  In the ED, he was afebrile but tachycardic, tachypneic, and with normal blood pressure. She was initially slightly hypoxic down to 80% on her home oxygen. On VBG she had pH 7.4 , PCO2 63, PO2 38, Na 132, HCO3 31, creatinine normal, WBC 14.8, BNP 1:30, urinalysis completely clear, lactic acid normal. A CT of the head was normal, and a chest x-ray showed chronic interstitial markings consistent with her known emphysema and suspected interstitial lung disease with no focal consolidation.   Today she is confused, agitated and experiencing hallucinations. She is not tolerating  Bi Pap treatment. Family is faced with advanced directive and treatment option decisions  This NP Hailey Keith reviewed medical records, received report from team, assessed the patient and then meet at the patient's bedside along with her  daughter Hailey Keith  to discuss diagnosis, prognosis, GOC, EOL wishes disposition and options.   A detailed discussion was had today regarding advanced directives.  Concepts specific to code status, artifical feeding and hydration, continued IV antibiotics and rehospitalization was had.  The difference between a aggressive medical intervention path  and a palliative comfort care path for this patient at this time was had.  Values and goals of care important to patient and family were attempted to be elicited.  Her only child/daughter Hailey Keith is at bedside, she hopes for improvement but is realistic that her mother may not be rebounding.   Natural trajectory and expectations at EOL were discussed.  Questions and concerns addressed.   Family encouraged to call with questions or concerns.  PMT will continue to support holistically.   Primary Decision Maker: Daughter Hailey Keith  SUMMARY OF RECOMMENDATIONS  - treat the treatable, family hopeful for improvement (daughter wishes to stay at bedside to assist with BiPAP compliance) - utilize medication to treat the delirium, hopefully to stabilize patient  -continue to reassess the situation for adjustments to care plan,  ensuring patient's comfort and dignity   Code Status/Advance Care Planning: DNR    Code Status Orders        Start     Ordered   10/24/15 0305  Do not attempt resuscitation (DNR)   Continuous    Question Answer Comment  In the event of cardiac or respiratory ARREST Do not call a "code blue"   In the event of cardiac or respiratory ARREST Do not perform Intubation, CPR, defibrillation or ACLS   In the event of cardiac or respiratory ARREST Use medication by any route, position, wound care, and other measures to relive pain and suffering. May  use oxygen, suction and manual treatment of airway obstruction as needed for comfort.      10/24/15 0304    Code Status History    Date Active Date Inactive Code Status  Order ID Comments User Context   10/16/2015  6:17 PM 10/21/2015  4:13 PM DNR [id number]  Hailey Morton, MD ED   10/16/2015  4:53 PM 10/16/2015  6:17 PM DNR [id number]  Hailey Jumbo, PA-C ED   05/01/2015  7:13 PM 05/06/2015  9:46 PM DNR [id number]  Hailey Jansky, MD Inpatient   12/26/2014  3:39 AM 12/28/2014  7:11 PM Full Code [id number]  Hailey Costa, MD Inpatient   12/26/2011 10:11 PM 12/29/2011  5:17 PM Full Code [id number]  Hailey Beards, RN Inpatient    Advance Directive Documentation        Most Recent Value   Type of Advance Directive  Healthcare Power of Attorney, Living will   Pre-existing out of facility DNR order (yellow form or pink MOST form)     "MOST" Form in Place?         Symptom Management:   Delirium/agiation: Haldol 2 mg IV every 6 hrs scheduled                                          Haldol 1 mg IV every 3 hrs prn *           Dyspnea: Morphine 1 mg IV every 3 hrs prn *           Chronic back pain: continue scheduled Tylenol, Oxycodone IR  5 mg po every 4 hrs prn  Palliative Prophylaxis:    Aspiration, Bowel Regimen, Delirium Protocol, Frequent Pain Assessment and Oral Care   Psycho-social/Spiritual:  Support System: Strong Desire for further Chaplaincy support:no   Chief Complaint/ Primary Diagnoses: Present on Admission:  . Depression, controlled . Essential hypertension . COPD mixed type ([employer]) . Bronchogenic carcinoma squamous cell type ([place]) . Acute on chronic respiratory failure with hypoxia and hypercapnia (HCC) . Delirium . Hyponatremia  I have reviewed the medical record, interviewed the patient and family, and examined the patient. The following aspects are pertinent.  Past Medical History  Diagnosis Date  . Hypertension   . Dyslipidemia   . Hard of hearing   . Hx of radiation therapy 06/18/13- 07/23/13    LUL lung primary, 7020 cGy 26 sessions  . COPD (chronic obstructive pulmonary disease) ([place])   . prediabetes   . Lung cancer ([place]) 05/20/13     LUL, squamous cell  . Skin cancer     basal cell  . CHF (congestive heart failure) ([place])    Social History   Social History  . Marital Status: Single    Spouse Name: N/A  . Number of Children: 1  . Years of Education: N/A   Occupational History  . retired    Social History Main Topics  . Smoking status: Former Smoker -- 0.50 packs/day for 60 years    Types: Cigarettes    Quit date: 05/12/2015  . Smokeless tobacco: None     Comment: 06/05/13 currently 1/2 PPD, trying to quit  . Alcohol Use: Yes     Comment: occ wine, 2 drinks nightly  . Drug Use: No  . Sexual Activity: Not Asked   Other Topics Concern  . None   Social History Narrative  Family History  Problem Relation Age of Onset  . Hypertension    . Lung cancer Sister     tx w/radiation, met to stomach  . Pneumonia Mother     Deceased  . Heart attack Father     Deceased  . Cancer Sister     "back of neck"   Scheduled Meds: . acetaminophen  1,000 mg Oral TID  . arformoterol  15 mcg Nebulization BID  . aspirin EC  81 mg Oral Daily  . atorvastatin  80 mg Oral q1800  . budesonide (PULMICORT) nebulizer solution  0.25 mg Nebulization BID  . citalopram  20 mg Oral Daily  . doxazosin  8 mg Oral QHS  . enoxaparin (LOVENOX) injection  40 mg Subcutaneous Q24H  . guaiFENesin  400 mg Oral BID  . levalbuterol  0.63 mg Nebulization Q4H   And  . ipratropium  0.5 mg Nebulization Q4H  . levofloxacin  500 mg Oral Q24H  . losartan  50 mg Oral Daily  . methylPREDNISolone (SOLU-MEDROL) injection  60 mg Intravenous Q8H  . sodium chloride flush  3 mL Intravenous Q12H   Continuous Infusions:  PRN Meds:.traZODone Medications Prior to Admission:  Prior to Admission medications   Medication Sig Start Date End Date Taking? Authorizing Provider  aspirin 81 MG tablet Take 81 mg by mouth daily.   Yes Historical Provider, MD  atorvastatin (LIPITOR) 80 MG tablet TAKE 1 TABLET EVERY DAY FOR CHOLESTEROL Patient taking differently:  TAKE 1 TABLET BY MOUTH ONLY ON WED AND SUN EACH WEEK FOR CHOLESTEROL 08/29/15  Yes Hailey Pinto, MD  citalopram (CELEXA) 20 MG tablet Take 1 tablet (20 mg total) by mouth daily. Patient taking differently: Take 40 mg by mouth daily.  10/21/15  Yes Orson Eva, MD  doxazosin (CARDURA) 8 MG tablet TAKE 1 TABLET AT BEDTIME 06/29/15  Yes Hailey Pinto, MD  guaifenesin (HUMIBID E) 400 MG TABS tablet Take 400 mg by mouth 2 (two) times daily.   Yes Historical Provider, MD  HYDROcodone-acetaminophen (NORCO) 5-325 MG tablet Take 2 tablets by mouth every 4 (four) hours as needed. Patient taking differently: Take 2 tablets by mouth every 4 (four) hours as needed for moderate pain.  10/05/15  Yes Courtney Forcucci, PA-C  ipratropium-albuterol (DUONEB) 0.5-2.5 (3) MG/3ML SOLN TAKE 3 MLS BY NEBULIZATION 4 (FOUR) TIMES DAILY. 06/09/15  Yes Hailey Pinto, MD  levofloxacin (LEVAQUIN) 500 MG tablet Take 1 tablet (500 mg total) by mouth at bedtime. 10/21/15  Yes Orson Eva, MD  losartan (COZAAR) 100 MG tablet TAKE 1 TABLET BY MOUTH DAILY AS NEEDED FOR BLOOD PRESSURE Patient taking differently: TAKE 1 TABLET BY MOUTH DAILY IN MORNING FOR BLOOD PRESSURE 09/11/15  Yes Hailey Pinto, MD  oxyCODONE-acetaminophen (PERCOCET/ROXICET) 5-325 MG per tablet Take two tablets by mouth every four hours as needed for pain. Do not exceed 4gms of Tylenol in 24 hours Patient taking differently: Take 2 tablets by mouth every 4 (four) hours as needed for moderate pain. Take two tablets by mouth every four hours as needed for pain. Do not exceed 4gms of Tylenol in 24 hours 05/06/15  Yes Theodis Blaze, MD  potassium chloride (K-DUR,KLOR-CON) 10 MEQ tablet Take 1 tablet (10 mEq total) by mouth daily. 10/21/15  Yes Orson Eva, MD  predniSONE (DELTASONE) 10 MG tablet Take 5 tablets (50 mg total) by mouth daily with breakfast. And decrease by 1 tablet daily 10/22/15  Yes Orson Eva, MD  PROAIR HFA 108 (90 BASE) MCG/ACT inhaler 1  TO 2 INHALATIONS EVERY 4  HOURS AS NEEDED FOR RESCUE ASTHMA 04/18/15  Yes Vicie Mutters, PA-C  Vitamin D, Ergocalciferol, (DRISDOL) 50000 UNITS CAPS capsule TAKE ONE CAPSULE EVERY DAY Patient taking differently: TAKE ONE CAPSULE BY MOUTH EVERY WED AND SUN 06/01/15  Yes Vicie Mutters, PA-C   Allergies  Allergen Reactions  . Flexeril [Cyclobenzaprine] Other (See Comments)    Mood changes   . Paxil [Paroxetine Hcl] Nausea Only    Review of Systems  Unable to perform ROS   Physical Exam  Constitutional: She appears well-developed and well-nourished. She appears ill.  Cardiovascular: Tachycardia present.   Respiratory: She has decreased breath sounds in the right lower field and the left lower field. She has wheezes.  GI: Soft. Normal appearance.  Psychiatric: Her mood appears anxious. She is agitated, hyperactive and actively hallucinating. Cognition and memory are impaired. She expresses impulsivity.    Vital Signs: BP 160/75 mmHg  Pulse 104  Temp(Src) 98.3 F (36.8 C) (Oral)  Resp 21  Ht 5' 3" (1.6 m)  Wt 71.5 kg (157 lb 10.1 oz)  BMI 27.93 kg/m2  SpO2 98%  SpO2: SpO2: 98 % O2 Device:SpO2: 98 % O2 Flow Rate: .O2 Flow Rate (L/min): 3 L/min  IO: Intake/output summary: No intake or output data in the 24 hours ending 10/25/15 0818  LBM:   Baseline Weight: Weight: 71.5 kg (157 lb 10.1 oz) Most recent weight: Weight: 71.5 kg (157 lb 10.1 oz)      Palliative Assessment/Data:  Flowsheet Rows        Most Recent Value   Intake Tab    Referral Department  Hospitalist   Unit at Time of Referral  ER   Palliative Care Primary Diagnosis  Cancer   Date Notified  10/24/15   Palliative Care Type  Return patient Palliative Care   Reason for referral  Clarify Goals of Care, Pain   Date of Admission  10/27/2015   # of days IP prior to Palliative referral  1   Clinical Assessment    Psychosocial & Spiritual Assessment    Palliative Care Outcomes       Additional Data Reviewed:  CBC:    Component Value  Date/Time   WBC 12.5* 10/25/2015 0208   WBC 11.0* 10/14/2015 1004   WBC 9.8 01/05/2015   HGB 11.3* 10/25/2015 0208   HGB 12.7 10/14/2015 1004   HCT 37.2 10/25/2015 0208   HCT 40.0 10/14/2015 1004   PLT 168 10/25/2015 0208   PLT 174 10/14/2015 1004   MCV 94.4 10/25/2015 0208   MCV 92.0 10/14/2015 1004   NEUTROABS 12.4* 11/01/2015 2135   NEUTROABS 8.8* 10/14/2015 1004   LYMPHSABS 1.4 10/28/2015 2135   LYMPHSABS 0.8* 10/14/2015 1004   MONOABS 0.7 10/27/2015 2135   MONOABS 1.1* 10/14/2015 1004   EOSABS 0.1 10/26/2015 2135   EOSABS 0.2 10/14/2015 1004   BASOSABS 0.0 10/12/2015 2135   BASOSABS 0.0 10/14/2015 1004   Comprehensive Metabolic Panel:    Component Value Date/Time   NA 138 10/25/2015 0208   NA 140 10/14/2015 1004   NA 142 01/05/2015   K 3.4* 10/25/2015 0208   K 4.4 10/14/2015 1004   CL 96* 10/25/2015 0208   CO2 35* 10/25/2015 0208   CO2 32* 10/14/2015 1004   BUN 17 10/25/2015 0208   BUN 12.8 10/14/2015 1004   BUN 16 01/05/2015   CREATININE 0.81 10/25/2015 0208   CREATININE 0.8 10/14/2015 1004   CREATININE 0.66 10/05/2015 1139  CREATININE 0.7 01/05/2015   GLUCOSE 106* 10/25/2015 0208   GLUCOSE 174* 10/14/2015 1004   CALCIUM 8.9 10/25/2015 0208   CALCIUM 9.3 10/14/2015 1004   AST 31 10/17/2015 0215   AST 16 10/14/2015 1004   ALT 19 10/17/2015 0215   ALT 16 10/14/2015 1004   ALKPHOS 57 10/17/2015 0215   ALKPHOS 79 10/14/2015 1004   BILITOT 0.8 10/17/2015 0215   BILITOT 0.51 10/14/2015 1004   PROT 5.3* 10/17/2015 0215   PROT 5.7* 10/14/2015 1004   ALBUMIN 2.9* 10/17/2015 0215   ALBUMIN 3.3* 10/14/2015 1004   Discussed with Dr Tana Coast and Dr Domingo Cocking  Time In: 0830 Time Out: 0945 Time Total: 90 min Greater than 50%  of this time was spent counseling and coordinating care related to the above assessment and plan.  Signed by: Hailey Lessen, NP  Hailey Royalty, NP  10/25/2015, 8:18 AM  Please contact Palliative Medicine Team phone at [phone] for questions  and concerns.

## 2015-10-25 NOTE — Clinical Documentation Improvement (Signed)
Internal Medicine  Can the diagnosis of altered mental status/encephalopathy be further specified in progress notes and discharge summary?   Metabolic encephalopathy  Other  Clinically Undetermined  Document any associated diagnoses/conditions.   Supporting Information:  H&P: Chief Complaint: Confusion and back pain Assessment/Plan 1. AMS:  This is new. Multifactorial. Minimal MCI at baseline, if anything. Suspect this acute delirium is due to dehydration (patient was discharged with new prescription for furosemide and has had minimal PO intake per daughter), and hypercarbia, possibly also pain and/or hyponatremia. No pneumonia by CXR and UA clear. Infection is doubted, at present.  -BiPAP now -Repeat ABG in 4 hours -Admit to stepdown -Fluid bolus now and encourage PO intake -Monitor BMP  2/20 progress note: Principal Problem:  Acute encephalopathy: Multifactorial, possibly acute delirium is due to dehydration, from poor oral intake, hypercarbic respiratory failure, hyponatremia. No pneumonia by CXR and UA clear. - Continue BiPAP, PCO2 improving - CT head showed no acute infarct or hemorrhage  - UA negative for UTI - Per daughter at the bedside, mental status is improving    Please exercise your independent, professional judgment when responding. A specific answer is not anticipated or expected.   Thank You,  [place] [phone]

## 2015-10-26 LAB — CBC
HEMATOCRIT: 39.5 % (ref 36.0–46.0)
Hemoglobin: 12.1 g/dL (ref 12.0–15.0)
MCH: 28.9 pg (ref 26.0–34.0)
MCHC: 30.6 g/dL (ref 30.0–36.0)
MCV: 94.5 fL (ref 78.0–100.0)
PLATELETS: 187 10*3/uL (ref 150–400)
RBC: 4.18 MIL/uL (ref 3.87–5.11)
RDW: 14 % (ref 11.5–15.5)
WBC: 10.9 10*3/uL — ABNORMAL HIGH (ref 4.0–10.5)

## 2015-10-26 LAB — BASIC METABOLIC PANEL
ANION GAP: 16 — AB (ref 5–15)
BUN: 18 mg/dL (ref 6–20)
CHLORIDE: 97 mmol/L — AB (ref 101–111)
CO2: 29 mmol/L (ref 22–32)
CREATININE: 0.85 mg/dL (ref 0.44–1.00)
Calcium: 9.3 mg/dL (ref 8.9–10.3)
GFR calc non Af Amer: >60 mL/min (ref >60)
Glucose, Bld: 145 mg/dL — ABNORMAL HIGH (ref 65–99)
POTASSIUM: 4.7 mmol/L (ref 3.5–5.1)
SODIUM: 142 mmol/L (ref 135–145)

## 2015-10-26 MED ORDER — MORPHINE SULFATE (PF) 2 MG/ML IV SOLN
2.0000 mg | INTRAVENOUS | Status: DC | PRN
  Administered 2015-10-26 – 2015-10-27 (×4): 2 mg via INTRAVENOUS
  Filled 2015-10-26 (×4): qty 1

## 2015-10-26 MED ORDER — LORAZEPAM 2 MG/ML IJ SOLN
1.0000 mg | INTRAMUSCULAR | Status: DC | PRN
Start: 2015-10-26 — End: 2015-10-28
  Administered 2015-10-27 – 2015-10-28 (×5): 1 mg via INTRAVENOUS
  Filled 2015-10-26 (×5): qty 1

## 2015-10-26 NOTE — Progress Notes (Signed)
Daily Progress Note   Patient Name: Hailey Keith       Date: 10/26/2015 DOB: 1935/01/30  Age: 81 y.o. MRN#: [id number] Attending Physician: Cherene Altes, MD Primary Care Physician: Alesia Richards, MD Admit Date: 10/07/2015  Reason for Consultation/Follow-up: Establishing goals of care, Non pain symptom management, Pain control and Psychosocial/spiritual support  Subjective:  - continued conversation at bedside with daughter [name].  Patient is currently resting comfortably,  daughter states " I am so glad to see her finally resting"  - continued conversation regarding current medical situation and importance of discussion and establishment of [place] pending outcomes over the next 24-48 hrs  - PMT will continue to support holistically  Length of Stay: 2 days  Current Medications: Scheduled Meds:  . acetaminophen  1,000 mg Oral TID  . arformoterol  15 mcg Nebulization BID  . aspirin EC  81 mg Oral Daily  . atorvastatin  80 mg Oral q1800  . budesonide (PULMICORT) nebulizer solution  0.25 mg Nebulization BID  . citalopram  20 mg Oral Daily  . doxazosin  8 mg Oral QHS  . enoxaparin (LOVENOX) injection  40 mg Subcutaneous Q24H  . furosemide  40 mg Oral Daily  . guaiFENesin  400 mg Oral BID  . haloperidol lactate  2 mg Intravenous Q6H  . levalbuterol  0.63 mg Nebulization Q4H   And  . ipratropium  0.5 mg Nebulization Q4H  . levofloxacin (LEVAQUIN) IV  750 mg Intravenous Q24H  . losartan  50 mg Oral Daily  . methylPREDNISolone (SOLU-MEDROL) injection  60 mg Intravenous Q8H  . sodium chloride flush  3 mL Intravenous Q12H    Continuous Infusions:    PRN Meds: haloperidol lactate, morphine injection, oxyCODONE, traZODone  Physical Exam: Physical Exam    Constitutional: Hailey Keith is sleeping. Hailey Keith appears ill.  -appears restful and much more comfortable today  Cardiovascular: Normal rate, regular rhythm and normal heart sounds.   Pulmonary/Chest: Hailey Keith has decreased breath sounds in the right lower field and the left lower field.  Neurological:  - reating quietly  Did not arouse  Skin: Skin is warm and dry.                Vital Signs: BP 150/66 mmHg  Pulse 102  Temp(Src) 97.9 F (36.6 C) (Oral)  Resp  25  Ht '5\' 3"'$  (1.6 m)  Wt 71.5 kg (157 lb 10.1 oz)  BMI 27.93 kg/m2  SpO2 96% SpO2: SpO2: 96 % O2 Device: O2 Device: Nasal Cannula O2 Flow Rate: O2 Flow Rate (L/min): 3 L/min  Intake/output summary:  Intake/Output Summary (Last 24 hours) at 10/26/15 1430 Last data filed at 10/26/15 0930  Gross per 24 hour  Intake    153 ml  Output      0 ml  Net    153 ml   LBM: Last BM Date: 10/20/15 Baseline Weight: Weight: 71.5 kg (157 lb 10.1 oz) Most recent weight: Weight: 71.5 kg (157 lb 10.1 oz)       Palliative Assessment/Data: Flowsheet Rows        Most Recent Value   Intake Tab    Referral Department  Hospitalist   Unit at Time of Referral  ER   Palliative Care Primary Diagnosis  Cancer   Date Notified  10/24/15   Palliative Care Type  Return patient Palliative Care   Reason for referral  Clarify Goals of Care, Pain   Date of Admission  10/07/2015   # of days IP prior to Palliative referral  1   Clinical Assessment    Psychosocial & Spiritual Assessment    Palliative Care Outcomes       Additional Data Reviewed: CBC    Component Value Date/Time   WBC 10.9* 10/26/2015 0300   WBC 11.0* 10/14/2015 1004   WBC 9.8 01/05/2015   RBC 4.18 10/26/2015 0300   RBC 4.35 10/14/2015 1004   HGB 12.1 10/26/2015 0300   HGB 12.7 10/14/2015 1004   HCT 39.5 10/26/2015 0300   HCT 40.0 10/14/2015 1004   PLT 187 10/26/2015 0300   PLT 174 10/14/2015 1004   MCV 94.5 10/26/2015 0300   MCV 92.0 10/14/2015 1004   MCH 28.9 10/26/2015 0300   MCH  29.2 10/14/2015 1004   MCHC 30.6 10/26/2015 0300   MCHC 31.7 10/14/2015 1004   RDW 14.0 10/26/2015 0300   RDW 13.7 10/14/2015 1004   LYMPHSABS 1.4 10/09/2015 2135   LYMPHSABS 0.8* 10/14/2015 1004   MONOABS 0.7 10/22/2015 2135   MONOABS 1.1* 10/14/2015 1004   EOSABS 0.1 10/29/2015 2135   EOSABS 0.2 10/14/2015 1004   BASOSABS 0.0 10/05/2015 2135   BASOSABS 0.0 10/14/2015 1004    CMP     Component Value Date/Time   NA 142 10/26/2015 0300   NA 140 10/14/2015 1004   NA 142 01/05/2015   K 4.7 10/26/2015 0300   K 4.4 10/14/2015 1004   CL 97* 10/26/2015 0300   CO2 29 10/26/2015 0300   CO2 32* 10/14/2015 1004   GLUCOSE 145* 10/26/2015 0300   GLUCOSE 174* 10/14/2015 1004   BUN 18 10/26/2015 0300   BUN 12.8 10/14/2015 1004   BUN 16 01/05/2015   CREATININE 0.85 10/26/2015 0300   CREATININE 0.8 10/14/2015 1004   CREATININE 0.66 10/05/2015 1139   CREATININE 0.7 01/05/2015   CALCIUM 9.3 10/26/2015 0300   CALCIUM 9.3 10/14/2015 1004   PROT 5.3* 10/17/2015 0215   PROT 5.7* 10/14/2015 1004   ALBUMIN 2.9* 10/17/2015 0215   ALBUMIN 3.3* 10/14/2015 1004   AST 31 10/17/2015 0215   AST 16 10/14/2015 1004   ALT 19 10/17/2015 0215   ALT 16 10/14/2015 1004   ALKPHOS 57 10/17/2015 0215   ALKPHOS 79 10/14/2015 1004   BILITOT 0.8 10/17/2015 0215   BILITOT 0.51 10/14/2015 1004   GFRNONAA >60  10/26/2015 0300   GFRNONAA 85 10/05/2015 1139   GFRAA >60 10/26/2015 0300   GFRAA >89 10/05/2015 1139       Problem List:  Patient Active Problem List   Diagnosis Date Noted  . Back pain   . Delirium 10/24/2015  . Hyponatremia 10/24/2015  . Altered mental status   . Acute on chronic diastolic CHF (congestive heart failure) ([place]) 10/21/2015  . COPD exacerbation ([place])   . Acute respiratory failure with hypoxia ([place]) 10/16/2015  . Acute exacerbation of congestive heart failure ([place]) 10/16/2015  . Medicare annual wellness visit, subsequent 06/27/2015  . BMI 25.87,  adult 06/27/2015  .  Thoracic compression fracture ([place])   . Palliative care encounter 05/04/2015  . DNR (do not resuscitate) discussion 05/04/2015  . Acute on chronic respiratory failure with hypoxia and hypercapnia (HCC)   . Hip fracture, left ([place]) 12/26/2014  . Distal radius fracture, left 12/26/2014  . Prediabetes 09/30/2013  . Vitamin D deficiency 09/30/2013  . Medication management 09/30/2013  . Bronchogenic carcinoma squamous cell type ([place]) 05/14/2013  . Mixed hyperlipidemia 06/21/2007  . TOBACCO ABUSE 06/21/2007  . Depression, controlled 06/21/2007  . Essential hypertension 06/21/2007  . COPD mixed type ([place]) 06/21/2007     Palliative Care Assessment & Plan    1.Code Status:  DNR    Code Status Orders        Start     Ordered   10/24/15 0305  Do not attempt resuscitation (DNR)   Continuous    Question Answer Comment  In the event of cardiac or respiratory ARREST Do not call a "code blue"   In the event of cardiac or respiratory ARREST Do not perform Intubation, CPR, defibrillation or ACLS   In the event of cardiac or respiratory ARREST Use medication by any route, position, wound care, and other measures to relive pain and suffering. May use oxygen, suction and manual treatment of airway obstruction as needed for comfort.      10/24/15 0304    Code Status History    Date Active Date Inactive Code Status Order ID Comments User Context   10/16/2015  6:17 PM 10/21/2015  4:13 PM DNR [id number]  Norval Morton, MD ED   10/16/2015  4:53 PM 10/16/2015  6:17 PM DNR [id number]  Rondel Jumbo, PA-C ED   05/01/2015  7:13 PM 05/06/2015  9:46 PM DNR [id number]  [place] Jansky, MD Inpatient   12/26/2014  3:39 AM 12/28/2014  7:11 PM Full Code [id number]  Ivor Costa, MD Inpatient   12/26/2011 10:11 PM 12/29/2011  5:17 PM Full Code [id number]  Marguerita Beards, RN Inpatient    Advance Directive Documentation        Most Recent Value   Type of Advance Directive  Healthcare Power of Attorney, Living will    Pre-existing out of facility DNR order (yellow form or pink MOST form)     "MOST" Form in Place?          Goals of Care/Additional Recommendations:    Treat the treatable, family remains hopeful for improvement   Psycho-social Needs: emotional support, allowed space and opportunity for daughter to express her anticipatory grief understanding her mother long term poor prognosis   Symptom Management:      1.  Agitation: Continue Scheduled Haldol and utilize prns as necessary   Palliative Prophylaxis:   Aspiration, Bowel Regimen, Delirium Protocol, Frequent Pain Assessment, Oral Care and Turn Reposition    Discharge Planning:  Pending outcomes,  likely home with hospice   Thank you for allowing the Palliative Medicine Team to assist in the care of this patient.   Time In: 0735 Time Out: 0800 Total Time 25 min Prolonged Time Billed  no         Knox Royalty, NP  10/26/2015, 2:30 PM  Please contact Palliative Medicine Team phone at [phone] for questions and concerns.

## 2015-10-26 NOTE — Progress Notes (Signed)
[place] TEAM 1 - Stepdown/ICU TEAM PROGRESS NOTE  CATHALINA BARCIA AVW:[id number] DOB: [birth year] DOA: 10/07/2015 PCP: Alesia Richards, MD  Admit HPI / Brief Narrative: 81 y.o. female with a history of COPD on home 3L O2, and lung cancer who presented with altered mental status.  The patient has long-standing back pain from vertebral compression fractures which were treated with vertebroplasty in 2016. One week ago she was admitted to [facility] for COPD flare, treated with levofloxacin, steroids, bronchodilators, and also believed to be in CHF because of a slightly elevated BNP, and so diuresis with furosemide and discharged on furosemide.  Since discharge, she had been taking furosemide, but not taking anything by mouth, with decreasing urine output. She became increasingly confused, and was calling out in pain.   In the ED she was afebrile but tachycardic and tachypneic, with normal blood pressure. She was initially slightly hypoxic down to 80% on her home oxygen. On VBG she had pH 7.4 , PCO2 63, PO2 38, Na 132, HCO3 31, creatinine normal, WBC 14.8, BNP 1:30, urinalysis completely clear, lactic acid normal. A CT of the head was normal, and a chest x-ray showed chronic interstitial markings consistent with her known emphysema and suspected interstitial lung disease with no focal consolidation.  HPI/Subjective: The patient is obtunded at time of my visit.  I had a lengthy discussion with the patient's daughter at the bedside and she is now ready to focus primarily on comfort.  I have explained to her that we will transition her mother to a palliative care bed upstairs and monitor her stability/symptom control to determine the level of support that she will need if it becomes appropriate for her to be discharged home with home hospice.  Assessment/Plan:  Acute encephalopathy -Multifactorial > hypercarbic respiratory failure, hyponatremia, narcotics(patient had been taking a lot of daughter's  narcotics prior to admission) -no pneumonia by CXR and UA clear -patient has not tolerated BIPAP  -CThead showed no acute infarct or hemorrhage   COPD with acute on chronic hypoxic and hypercapnic respiratory failure  -It is clear the patient has multi-factorial severe irreversible pulmonary disease and is now progressed to the point that she is BiPAP dependent - she however is extremely uncomfortable on BiPAP and actively fights against its use - her daughter recognizes that she now has end-stage lung disease and confirms that the patient would not wish to be intubated - BiPAP appears to be doing nothing but causing severe agitation and discomfort in the patient - the patient's daughter has decided to transition to focus directed care and I agree wholeheartedly that this is most appropriate  Acute on chronic Back pain -history of compression fractures - likely has new T5 compression fracture -Thoracic spine x-ray showed stable prior vertebroplasty T3 and T4, moderate compression deformity at T5 vertebral body  History of squamous cell Lung CA -history of stage IIB non-small cell lung cancer (squamous cell) post radiotherapy to the left upper lobe lung mass with partial response -last seen by Dr. Earlie Server 10/16, recommended repeat CT of the chest in 4 months -CT chest on 2/10 showed further response to therapy w/ radiation changes in LUL  but no residual nodule w/ "other pulm nodules similar and slightly decreased in size" but raising concern for ILD (also noted on CT 11/2014)  Leukocytosis Suspect this is from prednisone   Hyponatremia Resolved  Code Status: DNR - NO CODE BLUE Family Communication: Spoke with daughter at bedside at length Disposition Plan: Transition to palliative  care bed with comfort focused care   Consultants: Palliative Care  Procedures: none  Antibiotics: levaquin 2/20 > 2/22  DVT prophylaxis: lovenox   Objective: Blood pressure 155/78, pulse 97,  temperature 98.4 F (36.9 C), temperature source Axillary, resp. rate 21, height '5\' 3"'$  (1.6 m), weight 71.5 kg (157 lb 10.1 oz), SpO2 100 %.  Intake/Output Summary (Last 24 hours) at 10/26/15 1144 Last data filed at 10/26/15 0930  Gross per 24 hour  Intake      3 ml  Output      0 ml  Net      3 ml    Exam: General: Obtunded Lungs: Very poor air movement throughout all fields Cardiovascular: Tachycardic at approximately 100 bpm without appreciable murmur Abdomen: soft, bowel sounds hypoactive, no rebound, no ascites, no appreciable mass Extremities: no significant cyanosis, clubbing, or edema bilateral lower extremities  Data Reviewed:  Basic Metabolic Panel:  Recent Labs Lab 10/21/15 0612 10/19/2015 2135 10/24/15 0450 10/25/15 0208 10/26/15 0300  NA 139 132* 140 138 142  K 3.6 3.9 4.7 3.4* 4.7  CL 92* 89* 94* 96* 97*  CO2 37* 31 34* 35* 29  GLUCOSE 119* 117* 127* 106* 145*  BUN 25* 29* 25* 17 18  CREATININE 1.01* 1.04* 1.02* 0.81 0.85  CALCIUM 9.3 9.0 9.3 8.9 9.3  MG 2.2  --   --   --   --     CBC:  Recent Labs Lab 10/11/2015 2135 10/24/15 0450 10/24/15 1702 10/25/15 0208 10/26/15 0300  WBC 14.7* 14.9* 12.9* 12.5* 10.9*  NEUTROABS 12.4*  --   --   --   --   HGB 12.3 11.7* 11.9* 11.3* 12.1  HCT 40.8 39.3 39.2 37.2 39.5  MCV 95.3 95.6 94.9 94.4 94.5  PLT 152 140* 156 168 187    Recent Labs Lab 10/25/15 0739  AMMONIA 31   CBG:  Recent Labs Lab 10/20/15 0742 10/21/15 0747  GLUCAP 131* 90    Recent Results (from the past 240 hour(s))  Blood culture (routine x 2)     Status: None   Collection Time: 10/16/15  1:35 PM  Result Value Ref Range Status   Specimen Description BLOOD RIGHT ANTECUBITAL  Final   Special Requests BOTTLES DRAWN AEROBIC AND ANAEROBIC 5CC  Final   Culture NO GROWTH 5 DAYS  Final   Report Status 10/21/2015 FINAL  Final  Blood culture (routine x 2)     Status: None   Collection Time: 10/16/15  1:41 PM  Result Value Ref Range  Status   Specimen Description BLOOD LEFT ANTECUBITAL  Final   Special Requests BOTTLES DRAWN AEROBIC AND ANAEROBIC 5CC  Final   Culture NO GROWTH 5 DAYS  Final   Report Status 10/21/2015 FINAL  Final  MRSA PCR Screening     Status: None   Collection Time: 10/16/15  8:57 PM  Result Value Ref Range Status   MRSA by PCR NEGATIVE NEGATIVE Final    Comment:        The GeneXpert MRSA Assay (FDA approved for NASAL specimens only), is one component of a comprehensive MRSA colonization surveillance program. It is not intended to diagnose MRSA infection nor to guide or monitor treatment for MRSA infections.   Culture, sputum-assessment     Status: None   Collection Time: 10/17/15  4:27 PM  Result Value Ref Range Status   Specimen Description SPUTUM  Final   Special Requests NONE  Final   Sputum evaluation  Final    THIS SPECIMEN IS ACCEPTABLE. RESPIRATORY CULTURE REPORT TO FOLLOW.   Report Status 10/18/2015 FINAL  Final  Culture, respiratory (NON-Expectorated)     Status: None   Collection Time: 10/17/15  4:27 PM  Result Value Ref Range Status   Specimen Description EXPECTORATED SPUTUM  Final   Special Requests NONE  Final   Gram Stain   Final    ABUNDANT WBC PRESENT,BOTH PMN AND MONONUCLEAR FEW SQUAMOUS EPITHELIAL CELLS PRESENT MODERATE GRAM VARIABLE ROD FEW YEAST Performed at [facility]    Culture   Final    NORMAL OROPHARYNGEAL FLORA Performed at [facility]    Report Status 10/21/2015 FINAL  Final     Studies:   Recent x-ray studies have been reviewed in detail by the Attending Physician  Scheduled Meds:  Scheduled Meds: . acetaminophen  1,000 mg Oral TID  . arformoterol  15 mcg Nebulization BID  . aspirin EC  81 mg Oral Daily  . atorvastatin  80 mg Oral q1800  . budesonide (PULMICORT) nebulizer solution  0.25 mg Nebulization BID  . citalopram  20 mg Oral Daily  . doxazosin  8 mg Oral QHS  . enoxaparin (LOVENOX) injection  40 mg Subcutaneous  Q24H  . furosemide  40 mg Oral Daily  . guaiFENesin  400 mg Oral BID  . haloperidol lactate  2 mg Intravenous Q6H  . levalbuterol  0.63 mg Nebulization Q4H   And  . ipratropium  0.5 mg Nebulization Q4H  . levofloxacin (LEVAQUIN) IV  750 mg Intravenous Q24H  . losartan  50 mg Oral Daily  . methylPREDNISolone (SOLU-MEDROL) injection  60 mg Intravenous Q8H  . sodium chloride flush  3 mL Intravenous Q12H    Time spent on care of this patient: 35 mins   Valissa Lyvers T , MD   [facility] Office  [phone] Pager - Text Page per Shea Evans as per below:  On-Call/Text Page:      [email or web address]      password [email or web address]  If 7PM-7AM, please contact night-coverage [email or web address] Password [facility] 10/26/2015, 11:44 AM   LOS: 2 days

## 2015-10-26 NOTE — Progress Notes (Signed)
  On assessment, noted pt using accessory muscles to breath. Prolonged exhalation noted as well. Family at bedside. Pt o2 saturations are stable at this time.

## 2015-10-27 DIAGNOSIS — R451 Restlessness and agitation: Secondary | ICD-10-CM

## 2015-10-27 DIAGNOSIS — M545 Low back pain: Secondary | ICD-10-CM

## 2015-10-27 MED ORDER — HALOPERIDOL 1 MG PO TABS
2.0000 mg | ORAL_TABLET | Freq: Four times a day (QID) | ORAL | Status: DC
  Administered 2015-10-27 (×2): 2 mg via ORAL
  Filled 2015-10-27 (×3): qty 2

## 2015-10-27 MED ORDER — METHYLPREDNISOLONE SODIUM SUCC 40 MG IJ SOLR
40.0000 mg | Freq: Every day | INTRAMUSCULAR | Status: DC
  Administered 2015-10-28: 40 mg via INTRAVENOUS
  Filled 2015-10-27: qty 1

## 2015-10-27 MED ORDER — MORPHINE SULFATE (CONCENTRATE) 10 MG/0.5ML PO SOLN
5.0000 mg | ORAL | Status: DC | PRN
  Administered 2015-10-27 – 2015-10-28 (×6): 5 mg via ORAL
  Filled 2015-10-27 (×6): qty 0.5

## 2015-10-27 NOTE — Procedures (Signed)
Per pt's daughter, she just wants her mother to rest.  Pt's breathing is agonal and pt is DNR.  RN aware and providing Morphine. Neb treatments will not be given per family request.

## 2015-10-27 NOTE — Care Management Important Message (Signed)
Important Message  Patient Details  Name: Hailey Keith MRN: [id number] Date of Birth: [birth year]   Medicare Important Message Given:  Yes    Delorse Lek 10/27/2015, 4:39 PM

## 2015-10-27 NOTE — Progress Notes (Addendum)
RN noted that patient's breathing pattern seems to be changing; more labored, almost agonal at times.  Will still wake up to swallow Morphine, falls right back to sleep.  Patient staying on 3L nasal cannula with HOB 30 degrees.  RN administered Ativan and Morphine every 4 hours on day shift, seemed to maintain comfort well.  Will report to night RN to continue PRN medications overnight, daughter also aware of PRN schedule that seems to be providing the most comfort.  Daughter is going to ask MD in the morning if patient is stable enough to travel home for Hospice tomorrow, she is open to the idea of keeping her here Palliative if needed for patient comfort.

## 2015-10-27 NOTE — Care Management Note (Addendum)
Case Management Note  Patient Details  Name: Hailey Keith MRN: [id number] Date of Birth: [birth year]  Subjective/Objective:                    Action/Plan:  Discussed home with hospice with daughter Willia Craze [phone] bedside .  Patient has transfer bench , cane, walker , chair chair, bedside commode and oxygen 2 l [place] continuous through [place] . Choice offered to daughter . Daughter would like [facility] . Referral called to [facility] and faxed . Bambi will visit with patient and daughter today .   PCP is Dr Melford Aase, daughter feels that she is able to transport patient home via car but would like to discuss with MD first.  Expected Discharge Date:                  Expected Discharge Plan:  Home w Hospice Care  In-House Referral:     Discharge planning Services  CM Consult  Post Acute Care Choice:  Hospice Choice offered to:  Patient, Adult Children  DME Arranged:    DME Agency:     HH Arranged:    [place] Agency:  Other - See comment  Status of Service:  In process, will continue to follow  Medicare Important Message Given:    Date Medicare IM Given:    Medicare IM give by:    Date Additional Medicare IM Given:    Additional Medicare Important Message give by:     If discussed at [place] of Stay Meetings, dates discussed:    Additional Comments:  Marilu Favre, RN 10/27/2015, 10:08 AM

## 2015-10-27 NOTE — Progress Notes (Signed)
Daily Progress Note   Patient Name: Hailey Keith       Date: 10/27/2015 DOB: 1935/02/23  Age: 81 y.o. MRN#: [id number] Attending Physician: Reyne Dumas, MD Primary Care Physician: Alesia Richards, MD Admit Date: 10/28/2015  Reason for Consultation/Follow-up: Establishing goals of care, Non pain symptom management, Pain control and Psychosocial/spiritual support  Subjective:  - continued conversation at bedside with daughter Hailey Keith.  Patient is currently resting comfortably,  daughter states " I am so glad to see her finally resting".  Patient arouses for breakfast and recognizes her daughter  -focus of care is comfort and goal is to get patient home with hospice, will convert medication to po   - PMT will continue to support holistically  Length of Stay: 3 days  Current Medications: Scheduled Meds:  . acetaminophen  1,000 mg Oral TID  . arformoterol  15 mcg Nebulization BID  . budesonide (PULMICORT) nebulizer solution  0.25 mg Nebulization BID  . citalopram  20 mg Oral Daily  . doxazosin  8 mg Oral QHS  . furosemide  40 mg Oral Daily  . guaiFENesin  400 mg Oral BID  . haloperidol lactate  2 mg Intravenous Q6H  . levalbuterol  0.63 mg Nebulization Q4H   And  . ipratropium  0.5 mg Nebulization Q4H  . levofloxacin (LEVAQUIN) IV  750 mg Intravenous Q24H  . methylPREDNISolone (SOLU-MEDROL) injection  60 mg Intravenous Q8H  . sodium chloride flush  3 mL Intravenous Q12H    Continuous Infusions:    PRN Meds: haloperidol lactate, LORazepam, morphine injection, oxyCODONE, traZODone  Physical Exam: Physical Exam  Constitutional: She is sleeping. She appears ill.  -appears restful and much more comfortable today  Cardiovascular: Normal rate, regular rhythm and normal  heart sounds.   Pulmonary/Chest: She has decreased breath sounds in the right lower field and the left lower field.  Neurological:  - reating quietly  Did not arouse  Skin: Skin is warm and dry.                Vital Signs: BP 137/61 mmHg  Pulse 85  Temp(Src) 98.4 F (36.9 C) (Oral)  Resp 19  Ht '5\' 3"'$  (1.6 m)  Wt 71.5 kg (157 lb 10.1 oz)  BMI 27.93 kg/m2  SpO2 93% SpO2: SpO2: 93 % O2 Device: O2  Device: Nasal Cannula O2 Flow Rate: O2 Flow Rate (L/min): 2 L/min  Intake/output summary:   Intake/Output Summary (Last 24 hours) at 10/27/15 0858 Last data filed at 10/26/15 1813  Gross per 24 hour  Intake    213 ml  Output      0 ml  Net    213 ml   LBM: Last BM Date: 10/20/15 Baseline Weight: Weight: 71.5 kg (157 lb 10.1 oz) Most recent weight: Weight: 71.5 kg (157 lb 10.1 oz)       Palliative Assessment/Data: Flowsheet Rows        Most Recent Value   Intake Tab    Referral Department  Hospitalist   Unit at Time of Referral  ER   Palliative Care Primary Diagnosis  Cancer   Date Notified  10/24/15   Palliative Care Type  Return patient Palliative Care   Reason for referral  Clarify Goals of Care, Pain   Date of Admission  10/09/2015   # of days IP prior to Palliative referral  1   Clinical Assessment    Psychosocial & Spiritual Assessment    Palliative Care Outcomes       Additional Data Reviewed: CBC    Component Value Date/Time   WBC 10.9* 10/26/2015 0300   WBC 11.0* 10/14/2015 1004   WBC 9.8 01/05/2015   RBC 4.18 10/26/2015 0300   RBC 4.35 10/14/2015 1004   HGB 12.1 10/26/2015 0300   HGB 12.7 10/14/2015 1004   HCT 39.5 10/26/2015 0300   HCT 40.0 10/14/2015 1004   PLT 187 10/26/2015 0300   PLT 174 10/14/2015 1004   MCV 94.5 10/26/2015 0300   MCV 92.0 10/14/2015 1004   MCH 28.9 10/26/2015 0300   MCH 29.2 10/14/2015 1004   MCHC 30.6 10/26/2015 0300   MCHC 31.7 10/14/2015 1004   RDW 14.0 10/26/2015 0300   RDW 13.7 10/14/2015 1004   LYMPHSABS 1.4  10/30/2015 2135   LYMPHSABS 0.8* 10/14/2015 1004   MONOABS 0.7 10/29/2015 2135   MONOABS 1.1* 10/14/2015 1004   EOSABS 0.1 10/16/2015 2135   EOSABS 0.2 10/14/2015 1004   BASOSABS 0.0 11/01/2015 2135   BASOSABS 0.0 10/14/2015 1004    CMP     Component Value Date/Time   NA 142 10/26/2015 0300   NA 140 10/14/2015 1004   NA 142 01/05/2015   K 4.7 10/26/2015 0300   K 4.4 10/14/2015 1004   CL 97* 10/26/2015 0300   CO2 29 10/26/2015 0300   CO2 32* 10/14/2015 1004   GLUCOSE 145* 10/26/2015 0300   GLUCOSE 174* 10/14/2015 1004   BUN 18 10/26/2015 0300   BUN 12.8 10/14/2015 1004   BUN 16 01/05/2015   CREATININE 0.85 10/26/2015 0300   CREATININE 0.8 10/14/2015 1004   CREATININE 0.66 10/05/2015 1139   CREATININE 0.7 01/05/2015   CALCIUM 9.3 10/26/2015 0300   CALCIUM 9.3 10/14/2015 1004   PROT 5.3* 10/17/2015 0215   PROT 5.7* 10/14/2015 1004   ALBUMIN 2.9* 10/17/2015 0215   ALBUMIN 3.3* 10/14/2015 1004   AST 31 10/17/2015 0215   AST 16 10/14/2015 1004   ALT 19 10/17/2015 0215   ALT 16 10/14/2015 1004   ALKPHOS 57 10/17/2015 0215   ALKPHOS 79 10/14/2015 1004   BILITOT 0.8 10/17/2015 0215   BILITOT 0.51 10/14/2015 1004   GFRNONAA >60 10/26/2015 0300   GFRNONAA 85 10/05/2015 1139   GFRAA >60 10/26/2015 0300   GFRAA >89 10/05/2015 1139       Problem List:  Patient Active Problem List   Diagnosis Date Noted  . Back pain   . Delirium 10/24/2015  . Hyponatremia 10/24/2015  . Altered mental status   . Acute on chronic diastolic CHF (congestive heart failure) ([place]) 10/21/2015  . COPD exacerbation ([place])   . Acute respiratory failure with hypoxia ([place]) 10/16/2015  . Acute exacerbation of congestive heart failure ([place]) 10/16/2015  . Medicare annual wellness visit, subsequent 06/27/2015  . BMI 25.87,  adult 06/27/2015  . Thoracic compression fracture ([place])   . Palliative care encounter 05/04/2015  . DNR (do not resuscitate) discussion 05/04/2015  . Acute on chronic  respiratory failure with hypoxia and hypercapnia (HCC)   . Hip fracture, left ([place]) 12/26/2014  . Distal radius fracture, left 12/26/2014  . Prediabetes 09/30/2013  . Vitamin D deficiency 09/30/2013  . Medication management 09/30/2013  . Bronchogenic carcinoma squamous cell type ([place]) 05/14/2013  . Mixed hyperlipidemia 06/21/2007  . TOBACCO ABUSE 06/21/2007  . Depression, controlled 06/21/2007  . Essential hypertension 06/21/2007  . COPD mixed type ([place]) 06/21/2007     Palliative Care Assessment & Plan    1.Code Status:  DNR    Code Status Orders        Start     Ordered   10/24/15 0305  Do not attempt resuscitation (DNR)   Continuous    Question Answer Comment  In the event of cardiac or respiratory ARREST Do not call a "code blue"   In the event of cardiac or respiratory ARREST Do not perform Intubation, CPR, defibrillation or ACLS   In the event of cardiac or respiratory ARREST Use medication by any route, position, wound care, and other measures to relive pain and suffering. May use oxygen, suction and manual treatment of airway obstruction as needed for comfort.      10/24/15 0304    Code Status History    Date Active Date Inactive Code Status Order ID Comments User Context   10/16/2015  6:17 PM 10/21/2015  4:13 PM DNR [id number]  Norval Morton, MD ED   10/16/2015  4:53 PM 10/16/2015  6:17 PM DNR [id number]  Rondel Jumbo, PA-C ED   05/01/2015  7:13 PM 05/06/2015  9:46 PM DNR [id number]  Modena Jansky, MD Inpatient   12/26/2014  3:39 AM 12/28/2014  7:11 PM Full Code [id number]  Ivor Costa, MD Inpatient   12/26/2011 10:11 PM 12/29/2011  5:17 PM Full Code [id number]  Marguerita Beards, RN Inpatient    Advance Directive Documentation        Most Recent Value   Type of Advance Directive  Healthcare Power of Attorney, Living will   Pre-existing out of facility DNR order (yellow form or pink MOST form)     "MOST" Form in Place?          Goals of Care/Additional  Recommendations:    Treat the treatable, family remains hopeful for improvement   Psycho-social Needs: emotional support, allowed space and opportunity for daughter to express her anticipatory grief understanding her mother long term poor prognosis   Symptom Management:      1.  Agitation: Convert IV Haldol to Haldol 2 mg po every 6 hrs      2. Pain: Roxanol 5 mg po/sl every 2 hrs pn   Palliative Prophylaxis:   Aspiration, Bowel Regimen, Delirium Protocol, Frequent Pain Assessment, Oral Care and Turn Reposition    Discharge Planning:  Pending outcomes, likely home with hospice, will write for choice   Thank  you for allowing the Palliative Medicine Team to assist in the care of this patient.   Time In: 0800 Time Out: 0835 Total Time 35 min Prolonged Time Billed  no         Knox Royalty, NP  10/27/2015, 8:58 AM  Please contact Palliative Medicine Team phone at [phone] for questions and concerns.

## 2015-10-27 NOTE — Progress Notes (Signed)
[place] TEAM 1 - Stepdown/ICU TEAM PROGRESS NOTE  ARRIEL VICTOR ZOX:[id number] DOB: 17-Oct-1934 DOA: 10/18/2015 PCP: Alesia Richards, MD  Admit HPI / Brief Narrative: 81 y.o. female with a history of COPD on home 3L O2, and lung cancer who presented with altered mental status.  The patient has long-standing back pain from vertebral compression fractures which were treated with vertebroplasty in 2016. One week ago she was admitted to [facility] for COPD flare, treated with levofloxacin, steroids, bronchodilators, and also believed to be in CHF because of a slightly elevated BNP, and so diuresis with furosemide and discharged on furosemide.  Since discharge, she had been taking furosemide, but not taking anything by mouth, with decreasing urine output. She became increasingly confused, and was calling out in pain.   In the ED she was afebrile but tachycardic and tachypneic, with normal blood pressure. She was initially slightly hypoxic down to 80% on her home oxygen. On VBG she had pH 7.4 , PCO2 63, PO2 38, Na 132, HCO3 31, creatinine normal, WBC 14.8, BNP 1:30, urinalysis completely clear, lactic acid normal. A CT of the head was normal, and a chest x-ray showed chronic interstitial markings consistent with her known emphysema and suspected interstitial lung disease with no focal consolidation.  HPI/Subjective: The patient is obtunded at time of my visit.    Assessment/Plan:  Acute metabolic encephalopathy -Multifactorial > hypercarbic respiratory failure, hyponatremia, narcotics(patient had been taking a lot of daughter's narcotics prior to admission) -no pneumonia by CXR and UA clear -patient has not tolerated BIPAP  -CThead showed no acute infarct or hemorrhage   COPD with acute on chronic hypoxic and hypercapnic respiratory failure  -It is clear the patient has multi-factorial severe irreversible pulmonary disease and is now progressed to the point that she is BiPAP dependent -  she however is extremely uncomfortable on BiPAP and actively fights against its use - her daughter recognizes that she now has end-stage lung disease and confirms that the patient would not wish to be intubated - BiPAP appears to be doing nothing but causing severe agitation and discomfort in the patient - the patient's daughter has decided to transition to focus directed care and I agree wholeheartedly that this is most appropriate Patient is a DO NOT RESUSCITATE Patient likely to go home with hospice pending further discussion with palliative care today Continue Roxanol for comfort, Haldol for agitation DC IV steroids and antibiotics pending further discussion with palliative care Okay to Continue nebulizer treatments/Lasix for comfort  Acute on chronic Back pain -history of compression fractures - likely has new T5 compression fracture -Thoracic spine x-ray showed stable prior vertebroplasty T3 and T4, moderate compression deformity at T5 vertebral body Continue Roxanol  History of squamous cell Lung CA -history of stage IIB non-small cell lung cancer (squamous cell) post radiotherapy to the left upper lobe lung mass with partial response -last seen by Dr. Earlie Server 10/16, recommended repeat CT of the chest in 4 months -CT chest on 2/10 showed further response to therapy w/ radiation changes in LUL  but no residual nodule w/ "other pulm nodules similar and slightly decreased in size" but raising concern for ILD (also noted on CT 11/2014)  Leukocytosis Suspect this is from prednisone, reduce steroids   Hyponatremia Resolved  Code Status: DNR - NO CODE BLUE Family Communication: Spoke with daughter at bedside at length Disposition Plan: Transition to palliative care bed pending further recommendations from palliative care  Consultants: Palliative Care  Procedures: none  Antibiotics:  levaquin 2/20 > 2/22  DVT prophylaxis: lovenox   Objective: Blood pressure 137/61, pulse 85,  temperature 98.4 F (36.9 C), temperature source Oral, resp. rate 19, height '5\' 3"'$  (1.6 m), weight 71.5 kg (157 lb 10.1 oz), SpO2 93 %.  Intake/Output Summary (Last 24 hours) at 10/27/15 1502 Last data filed at 10/27/15 1142  Gross per 24 hour  Intake    210 ml  Output      0 ml  Net    210 ml    Exam: Constitutional: She is sleeping. She appears ill.  -appears restful and much more comfortable today  Cardiovascular: Normal rate, regular rhythm and normal heart sounds.  Pulmonary/Chest: She has decreased breath sounds in the right lower field and the left lower field.  Neurological:  - reating quietly Did not arouse  Skin: Skin is warm and dry.   Data Reviewed:  Basic Metabolic Panel:  Recent Labs Lab 10/21/15 0612 10/08/2015 2135 10/24/15 0450 10/25/15 0208 10/26/15 0300  NA 139 132* 140 138 142  K 3.6 3.9 4.7 3.4* 4.7  CL 92* 89* 94* 96* 97*  CO2 37* 31 34* 35* 29  GLUCOSE 119* 117* 127* 106* 145*  BUN 25* 29* 25* 17 18  CREATININE 1.01* 1.04* 1.02* 0.81 0.85  CALCIUM 9.3 9.0 9.3 8.9 9.3  MG 2.2  --   --   --   --     CBC:  Recent Labs Lab 10/27/2015 2135 10/24/15 0450 10/24/15 1702 10/25/15 0208 10/26/15 0300  WBC 14.7* 14.9* 12.9* 12.5* 10.9*  NEUTROABS 12.4*  --   --   --   --   HGB 12.3 11.7* 11.9* 11.3* 12.1  HCT 40.8 39.3 39.2 37.2 39.5  MCV 95.3 95.6 94.9 94.4 94.5  PLT 152 140* 156 168 187    Recent Labs Lab 10/25/15 0739  AMMONIA 31   CBG:  Recent Labs Lab 10/21/15 0747  GLUCAP 90    Recent Results (from the past 240 hour(s))  Culture, sputum-assessment     Status: None   Collection Time: 10/17/15  4:27 PM  Result Value Ref Range Status   Specimen Description SPUTUM  Final   Special Requests NONE  Final   Sputum evaluation   Final    THIS SPECIMEN IS ACCEPTABLE. RESPIRATORY CULTURE REPORT TO FOLLOW.   Report Status 10/18/2015 FINAL  Final  Culture, respiratory (NON-Expectorated)     Status: None   Collection Time: 10/17/15   4:27 PM  Result Value Ref Range Status   Specimen Description EXPECTORATED SPUTUM  Final   Special Requests NONE  Final   Gram Stain   Final    ABUNDANT WBC PRESENT,BOTH PMN AND MONONUCLEAR FEW SQUAMOUS EPITHELIAL CELLS PRESENT MODERATE GRAM VARIABLE ROD FEW YEAST Performed at [facility]    Culture   Final    NORMAL OROPHARYNGEAL FLORA Performed at [facility]    Report Status 10/21/2015 FINAL  Final     Studies:   Recent x-ray studies have been reviewed in detail by the Attending Physician  Scheduled Meds:  Scheduled Meds: . acetaminophen  1,000 mg Oral TID  . arformoterol  15 mcg Nebulization BID  . budesonide (PULMICORT) nebulizer solution  0.25 mg Nebulization BID  . citalopram  20 mg Oral Daily  . doxazosin  8 mg Oral QHS  . furosemide  40 mg Oral Daily  . guaiFENesin  400 mg Oral BID  . haloperidol  2 mg Oral Q6H  . levalbuterol  0.63 mg Nebulization Q4H   And  . ipratropium  0.5 mg Nebulization Q4H  . levofloxacin (LEVAQUIN) IV  750 mg Intravenous Q24H  . methylPREDNISolone (SOLU-MEDROL) injection  60 mg Intravenous Q8H  . sodium chloride flush  3 mL Intravenous Q12H    Time spent on care of this patient: 36 mins   Reyne Dumas , MD   [facility] Office  [phone] Pager - Text Page per Shea Evans as per below:  On-Call/Text Page:      [email or web address]      password [email or web address]  If 7PM-7AM, please contact night-coverage [email or web address] Password [email or web address] 10/27/2015, 3:02 PM   LOS: 3 days

## 2015-10-28 DIAGNOSIS — R451 Restlessness and agitation: Secondary | ICD-10-CM | POA: Insufficient documentation

## 2015-10-28 MED ORDER — LORAZEPAM 2 MG/ML IJ SOLN
1.0000 mg | Freq: Three times a day (TID) | INTRAMUSCULAR | Status: DC
  Administered 2015-10-28 – 2015-10-29 (×4): 1 mg via INTRAVENOUS
  Filled 2015-10-28 (×4): qty 1

## 2015-10-28 MED ORDER — ONDANSETRON HCL 4 MG/2ML IJ SOLN
4.0000 mg | Freq: Three times a day (TID) | INTRAMUSCULAR | Status: DC | PRN
Start: 2015-10-28 — End: 2015-10-31

## 2015-10-28 MED ORDER — SCOPOLAMINE 1 MG/3DAYS TD PT72
1.0000 | MEDICATED_PATCH | TRANSDERMAL | Status: DC
  Administered 2015-10-28: 1.5 mg via TRANSDERMAL
  Filled 2015-10-28: qty 1

## 2015-10-28 MED ORDER — LORAZEPAM 2 MG/ML IJ SOLN
1.0000 mg | INTRAMUSCULAR | Status: DC | PRN
  Administered 2015-10-30 (×2): 1 mg via INTRAVENOUS
  Filled 2015-10-28 (×2): qty 1

## 2015-10-28 MED ORDER — MORPHINE SULFATE (PF) 2 MG/ML IV SOLN
1.0000 mg | INTRAVENOUS | Status: DC
  Administered 2015-10-28 – 2015-10-29 (×6): 1 mg via INTRAVENOUS
  Filled 2015-10-28 (×6): qty 1

## 2015-10-28 MED ORDER — ACETAMINOPHEN 650 MG RE SUPP: 650.0000 mg | RECTAL | Status: DC | PRN

## 2015-10-28 MED ORDER — FUROSEMIDE 10 MG/ML IJ SOLN
40.0000 mg | Freq: Every day | INTRAMUSCULAR | Status: DC
  Administered 2015-10-28 – 2015-10-29 (×2): 40 mg via INTRAVENOUS
  Filled 2015-10-28 (×2): qty 4

## 2015-10-28 MED ORDER — GLYCOPYRROLATE 0.2 MG/ML IJ SOLN
0.4000 mg | Freq: Three times a day (TID) | INTRAMUSCULAR | Status: DC | PRN
  Administered 2015-10-28: 0.4 mg via INTRAVENOUS
  Filled 2015-10-28: qty 2

## 2015-10-28 MED ORDER — MORPHINE SULFATE (PF) 2 MG/ML IV SOLN
2.0000 mg | INTRAVENOUS | Status: DC | PRN
  Administered 2015-10-29 (×2): 2 mg via INTRAVENOUS
  Filled 2015-10-28 (×2): qty 1

## 2015-10-28 NOTE — Progress Notes (Signed)
PMT RN Note: Patient is actively dying. She has had an impressive decline in 24 hours, and previous medication doses were not keeping her symptoms well-managed. Daughter is at bedside. Obtained orders from Dr Hilma Favors to adjust comfort meds and d/c PO meds, as pt is no longer waking up. At this time, pt is too unstable for transport, and due to rapid overnight decline, PMT now anticipates hospital death. Discussed plan with daughter and RN CJ, as well as notified Bambi with [facility] that pt will not d/c home. Larina Earthly, RN, BSN, [facility] 10/28/2015 10:31 AM Cell [phone] 8:00-4:00 Monday-Friday Office [phone]

## 2015-10-28 NOTE — Progress Notes (Signed)
[place] TEAM 1 - Stepdown/ICU TEAM PROGRESS NOTE  PURA PICINICH GNO:[id number] DOB: 11-21-1934 DOA: 10/30/2015 PCP: Alesia Richards, MD  Admit HPI / Brief Narrative: 81 y.o. female with a history of COPD on home 3L O2, and lung cancer who presented with altered mental status.  The patient has long-standing back pain from vertebral compression fractures which were treated with vertebroplasty in 2016. One week ago she was admitted to [facility] for COPD flare, treated with levofloxacin, steroids, bronchodilators, and also believed to be in CHF because of a slightly elevated BNP, and so diuresis with furosemide and discharged on furosemide.  Since discharge, she had been taking furosemide, but not taking anything by mouth, with decreasing urine output. She became increasingly confused, and was calling out in pain.   In the ED she was afebrile but tachycardic and tachypneic, with normal blood pressure. She was initially slightly hypoxic down to 80% on her home oxygen. On VBG she had pH 7.4 , PCO2 63, PO2 38, Na 132, HCO3 31, creatinine normal, WBC 14.8, BNP 1:30, urinalysis completely clear, lactic acid normal. A CT of the head was normal, and a chest x-ray showed chronic interstitial markings consistent with her known emphysema and suspected interstitial lung disease with no focal consolidation.  HPI/Subjective: Patient is actively dying. She has had an impressive decline in 24 hours, and previous medication doses were not keeping her symptoms well-managed  Assessment/Plan:  Acute metabolic encephalopathy -Multifactorial > hypercarbic respiratory failure, hyponatremia, narcotics(patient had been taking a lot of daughter's narcotics prior to admission) -no pneumonia by CXR and UA clear -patient has not tolerated BIPAP  -CThead showed no acute infarct or hemorrhage Continues to be confused and somnolent  COPD with acute on chronic hypoxic and hypercapnic respiratory failure  Patient  known to have severe irreversible pulmonary disease and is now progressed to the point that she is BiPAP dependent - unable to tolerate BiPAP  patient's daughter has decided to transition to focus directed care , palliative care has started patient on IV morphine Patient is a DO NOT RESUSCITATE Continue Roxanol /IV morphine for comfort, Haldol for agitation We'll discontinue steroids and antibiotics today Anticipate hospital death  Acute on chronic Back pain -history of compression fractures - likely has new T5 compression fracture -Thoracic spine x-ray showed stable prior vertebroplasty T3 and T4, moderate compression deformity at T5 vertebral body Continue Roxanol  History of squamous cell Lung CA -history of stage IIB non-small cell lung cancer (squamous cell) post radiotherapy to the left upper lobe lung mass with partial response -last seen by Dr. Earlie Server 10/16, recommended repeat CT of the chest in 4 months -CT chest on 2/10 showed further response to therapy w/ radiation changes in LUL  but no residual nodule w/ "other pulm nodules similar and slightly decreased in size" but raising concern for ILD (also noted on CT 11/2014)  Leukocytosis Likely from steroids, now steroids are being discontinued  Hyponatremia Resolved  Code Status: DNR - NO CODE BLUE Family Communication: Spoke with daughter at bedside at length Disposition Plan: Currently patient is full comfort care measures, anticipate hospital death  Consultants: Palliative Care  Procedures: none  Antibiotics: levaquin 2/20 > 2/22  DVT prophylaxis: lovenox   Objective: Blood pressure 150/64, pulse 101, temperature 98.7 F (37.1 C), temperature source Oral, resp. rate 20, height '5\' 3"'$  (1.6 m), weight 71.5 kg (157 lb 10.1 oz), SpO2 94 %.  Intake/Output Summary (Last 24 hours) at 10/28/15 1344 Last data filed at 10/28/15  1319  Gross per 24 hour  Intake    270 ml  Output      0 ml  Net    270 ml     Exam: Constitutional: She is sleeping. She appears ill.  -appears restful and much more comfortable today  Cardiovascular: Normal rate, regular rhythm and normal heart sounds.  Pulmonary/Chest: She has decreased breath sounds in the right lower field and the left lower field.  Neurological:  - reating quietly Did not arouse  Skin: Skin is warm and dry.   Data Reviewed:  Basic Metabolic Panel:  Recent Labs Lab 10/19/2015 2135 10/24/15 0450 10/25/15 0208 10/26/15 0300  NA 132* 140 138 142  K 3.9 4.7 3.4* 4.7  CL 89* 94* 96* 97*  CO2 31 34* 35* 29  GLUCOSE 117* 127* 106* 145*  BUN 29* 25* 17 18  CREATININE 1.04* 1.02* 0.81 0.85  CALCIUM 9.0 9.3 8.9 9.3    CBC:  Recent Labs Lab 10/19/2015 2135 10/24/15 0450 10/24/15 1702 10/25/15 0208 10/26/15 0300  WBC 14.7* 14.9* 12.9* 12.5* 10.9*  NEUTROABS 12.4*  --   --   --   --   HGB 12.3 11.7* 11.9* 11.3* 12.1  HCT 40.8 39.3 39.2 37.2 39.5  MCV 95.3 95.6 94.9 94.4 94.5  PLT 152 140* 156 168 187    Recent Labs Lab 10/25/15 0739  AMMONIA 31   CBG: No results for input(s): GLUCAP in the last 168 hours.  No results found for this or any previous visit (from the past 240 hour(s)).   Studies:   Recent x-ray studies have been reviewed in detail by the Attending Physician  Scheduled Meds:  Scheduled Meds: . arformoterol  15 mcg Nebulization BID  . budesonide (PULMICORT) nebulizer solution  0.25 mg Nebulization BID  . furosemide  40 mg Intravenous Daily  . levalbuterol  0.63 mg Nebulization Q4H   And  . ipratropium  0.5 mg Nebulization Q4H  . LORazepam  1 mg Intravenous TID  . methylPREDNISolone (SOLU-MEDROL) injection  40 mg Intravenous Daily  .  morphine injection  1 mg Intravenous 6 times per day  . sodium chloride flush  3 mL Intravenous Q12H    Time spent on care of this patient: 36 mins   Reyne Dumas , MD   [facility] Office  [phone] Pager - Text Page per Shea Evans as per  below:  On-Call/Text Page:      [email or web address]      password [email or web address]  If 7PM-7AM, please contact night-coverage [email or web address] Password [email or web address] 10/28/2015, 1:44 PM   LOS: 4 days

## 2015-10-29 MED ORDER — MORPHINE BOLUS VIA INFUSION
2.0000 mg | INTRAVENOUS | Status: DC | PRN
  Administered 2015-10-29 – 2015-10-30 (×4): 2 mg via INTRAVENOUS
  Filled 2015-10-29 (×5): qty 2

## 2015-10-29 MED ORDER — HALOPERIDOL LACTATE 5 MG/ML IJ SOLN: 1.0000 mg | Freq: Four times a day (QID) | INTRAMUSCULAR | Status: DC | PRN

## 2015-10-29 MED ORDER — HALOPERIDOL LACTATE 5 MG/ML IJ SOLN: 1.0000 mg | Freq: Four times a day (QID) | INTRAMUSCULAR | Status: DC

## 2015-10-29 MED ORDER — SODIUM CHLORIDE 0.9 % IV SOLN
2.0000 mg/h | INTRAVENOUS | Status: DC
  Administered 2015-10-29 – 2015-10-31 (×3): 2 mg/h via INTRAVENOUS
  Filled 2015-10-29: qty 10

## 2015-10-29 NOTE — Progress Notes (Signed)
[facility] Progress Note  Patient: Hailey Keith ZOX:[id number]   PCP: Alesia Richards, MD DOB: 01-18-1935   DOA: 10/07/2015   DOS: 10/29/2015   Date of Service: the patient was seen and examined on 10/29/2015  Subjective: Patient has been more agitated and also has been complaining of back pain. Appears to have some hallucination as well.  Assessment and Plan: 1. Acute on chronic respiratory failure with hypoxia and hypercapnia. Acute exacerbation of COPD. Acute delirium. Acute on chronic back pain. History of squamous cell lung carcinoma.  Patient presented with acute on chronic respiratory failure patient was DO NOT RESUSCITATE admission. Patient was not able to tolerate the BiPAP. Palliative care was consulted and the patient was transitioned to full comfort care. Initial plan was to transfer the patient to hospice home. Given patient's further worsening her medication has been adjusted and patient was thought to be actively dying here in the hospital. This morning the patient has been more agitated. Patient will be started on IV morphine drip by palliative care. I would also adjust her Haldol dose. Continue to monitor the patient for symptomatic improvement.  Nutrition, comfort feeds Advance goals of care discussion: DNR/DNI, full comfort  Brief Summary of Hospitalization:  Consultants: Palliative care Antibiotics: Anti-infectives    Start     Dose/Rate Route Frequency Ordered Stop   10/26/15 1000  levofloxacin (LEVAQUIN) IVPB 750 mg  Status:  Discontinued     750 mg 100 mL/hr over 90 Minutes Intravenous Every 24 hours 10/25/15 1027 10/28/15 1028   10/25/15 0800  levofloxacin (LEVAQUIN) tablet 500 mg  Status:  Discontinued     500 mg Oral Every 24 hours 10/25/15 0735 10/25/15 1027   10/24/15 0700  levofloxacin (LEVAQUIN) tablet 500 mg  Status:  Discontinued     500 mg Oral Daily at bedtime 10/24/15 0655 10/25/15 0735      Family Communication: family was  present at bedside, at the time of interview.  Opportunity was given to ask question and all questions were answered satisfactorily.   Disposition: Barriers to safe discharge: Expecting in-hospital death   Intake/Output Summary (Last 24 hours) at 10/29/15 1601 Last data filed at 10/29/15 1420  Gross per 24 hour  Intake 113.33 ml  Output      0 ml  Net 113.33 ml   Filed Weights   10/24/15 1845  Weight: 71.5 kg (157 lb 10.1 oz)    Objective: Physical Exam: Filed Vitals:   10/28/15 1501 10/28/15 2202 10/29/15 0511 10/29/15 1130  BP:   145/70   Pulse:   91   Temp:   97.8 F (36.6 C)   TempSrc:   Oral   Resp:   14   Height:      Weight:      SpO2: 96% 95% 97% 90%     General: Appear in mild distress,  Cardiovascular: S1 and S2 Present,  Respiratory: Bilateral Air entry present and bilateral Crackles, no wheezes Abdomen: Bowel Sound present, Extremities: no Pedal edema,   Data Reviewed: CBC:  Recent Labs Lab 10/06/2015 2135 10/24/15 0450 10/24/15 1702 10/25/15 0208 10/26/15 0300  WBC 14.7* 14.9* 12.9* 12.5* 10.9*  NEUTROABS 12.4*  --   --   --   --   HGB 12.3 11.7* 11.9* 11.3* 12.1  HCT 40.8 39.3 39.2 37.2 39.5  MCV 95.3 95.6 94.9 94.4 94.5  PLT 152 140* 156 168 811   Basic Metabolic Panel:  Recent Labs Lab 10/27/2015 2135 10/24/15 0450 10/25/15 9147  10/26/15 0300  NA 132* 140 138 142  K 3.9 4.7 3.4* 4.7  CL 89* 94* 96* 97*  CO2 31 34* 35* 29  GLUCOSE 117* 127* 106* 145*  BUN 29* 25* 17 18  CREATININE 1.04* 1.02* 0.81 0.85  CALCIUM 9.0 9.3 8.9 9.3   Liver Function Tests: No results for input(s): AST, ALT, ALKPHOS, BILITOT, PROT, ALBUMIN in the last 168 hours. No results for input(s): LIPASE, AMYLASE in the last 168 hours.  Recent Labs Lab 10/25/15 0739  AMMONIA 31    Cardiac Enzymes: No results for input(s): CKTOTAL, CKMB, CKMBINDEX, TROPONINI in the last 168 hours.  BNP (last 3 results)  Recent Labs  10/16/15 1304 10/11/2015 2135  10/25/15 1101  BNP 340.6* 132.8* 225.1*    CBG: No results for input(s): GLUCAP in the last 168 hours.  No results found for this or any previous visit (from the past 240 hour(s)).   Studies: No results found.   Scheduled Meds: . arformoterol  15 mcg Nebulization BID  . budesonide (PULMICORT) nebulizer solution  0.25 mg Nebulization BID  . furosemide  40 mg Intravenous Daily  . haloperidol lactate  1 mg Intravenous Q6H  . levalbuterol  0.63 mg Nebulization Q4H   And  . ipratropium  0.5 mg Nebulization Q4H  . LORazepam  1 mg Intravenous TID  . scopolamine  1 patch Transdermal Q72H  . sodium chloride flush  3 mL Intravenous Q12H   Continuous Infusions: . morphine 2 mg/hr (10/29/15 1220)   PRN Meds: acetaminophen, glycopyrrolate, haloperidol lactate, LORazepam, morphine injection, morphine, ondansetron (ZOFRAN) IV  Time spent: 30 minutes  Author: Berle Mull, MD [facility] Pager: [phone] 10/29/2015 4:01 PM  If 7PM-7AM, please contact night-coverage at [email or web address], password [facility]

## 2015-10-29 NOTE — Clinical Social Work Note (Signed)
Clinical Social Worker received referral, stating that patient family had questions regarding cremation services.  CSW stopped by patient room, however patient family sleeping soundly at bedside.  CSW provided [employer] to RN in the event that patient family wishes to further pursue.  Clinical Social Worker will sign off for now as social work intervention is no longer needed. Please consult [place] again if new need arises.  Barbette Or, [place]

## 2015-10-29 NOTE — Progress Notes (Signed)
Patient family okay and agree with not coming in and out throughout night for breathing treatment and will call if they or RN thinks she needs it.

## 2015-10-30 DIAGNOSIS — K117 Disturbances of salivary secretion: Secondary | ICD-10-CM

## 2015-10-30 DIAGNOSIS — R0600 Dyspnea, unspecified: Secondary | ICD-10-CM

## 2015-10-30 MED ORDER — IPRATROPIUM BROMIDE 0.02 % IN SOLN: 0.5000 mg | Freq: Four times a day (QID) | RESPIRATORY_TRACT | Status: DC | PRN

## 2015-10-30 MED ORDER — GLYCOPYRROLATE 0.2 MG/ML IJ SOLN
0.4000 mg | Freq: Four times a day (QID) | INTRAMUSCULAR | Status: DC
  Administered 2015-10-30 (×4): 0.4 mg via INTRAVENOUS
  Filled 2015-10-30 (×4): qty 2

## 2015-10-30 MED ORDER — LEVALBUTEROL HCL 0.63 MG/3ML IN NEBU: 0.6300 mg | INHALATION_SOLUTION | Freq: Four times a day (QID) | RESPIRATORY_TRACT | Status: DC | PRN

## 2015-10-30 NOTE — Progress Notes (Signed)
Daily Progress Note   Patient Name: Hailey Keith       Date: 10/30/2015 DOB: August 26, 1935  Age: 81 y.o. MRN#: [id number] Attending Physician: Lavina Hamman, MD Primary Care Physician: Alesia Richards, MD Admit Date: 10/14/2015  Reason for Consultation/Follow-up: Establishing goals of care, Non pain symptom management, Pain control and Psychosocial/spiritual support  Subjective:  - continued conversation at bedside with daughter Hailey Keith, patient is actively dying, daughter understands prognosis of hrs to days.  Questions and concerns addressed -focus of care is comfort  - PMT will continue to support holistically  Length of Stay: 6 days  Current Medications: Scheduled Meds:  . scopolamine  1 patch Transdermal Q72H  . sodium chloride flush  3 mL Intravenous Q12H    Continuous Infusions: . morphine 2 mg/hr (10/29/15 1220)    PRN Meds: acetaminophen, glycopyrrolate, haloperidol lactate, levalbuterol **AND** ipratropium, LORazepam, morphine injection, morphine, ondansetron (ZOFRAN) IV  Physical Exam: Physical Exam  Constitutional: She is sleeping. She appears ill.  -appears restful and much more comfortable today  HENT:  Audible throat secretions  Cardiovascular: Normal rate, regular rhythm and normal heart sounds.   Pulmonary/Chest: She has decreased breath sounds in the right lower field and the left lower field.  Neurological:  - reating quietly  Did not arouse  Skin: Skin is warm and dry.                Vital Signs: BP 78/43 mmHg  Pulse 122  Temp(Src) 97.9 F (36.6 C) (Oral)  Resp 10  Ht '5\' 3"'$  (1.6 m)  Wt 71.5 kg (157 lb 10.1 oz)  BMI 27.93 kg/m2  SpO2 94% SpO2: SpO2: 94 % O2 Device: O2 Device: Nasal Cannula O2 Flow Rate: O2 Flow Rate (L/min): 3  L/min  Intake/output summary:   Intake/Output Summary (Last 24 hours) at 10/30/15 0948 Last data filed at 10/30/15 0831  Gross per 24 hour  Intake   3.33 ml  Output      0 ml  Net   3.33 ml   LBM: Last BM Date: 10/20/15 Baseline Weight: Weight: 71.5 kg (157 lb 10.1 oz) Most recent weight: Weight: 71.5 kg (157 lb 10.1 oz)       Palliative Assessment/Data: Flowsheet Rows        Most Recent Value   Intake Tab  Referral Department  Hospitalist   Unit at Time of Referral  ER   Palliative Care Primary Diagnosis  Cancer   Date Notified  10/24/15   Palliative Care Type  Return patient Palliative Care   Reason for referral  Clarify Goals of Care, Pain   Date of Admission  10/07/2015   # of days IP prior to Palliative referral  1   Clinical Assessment    Psychosocial & Spiritual Assessment    Palliative Care Outcomes       Additional Data Reviewed: CBC    Component Value Date/Time   WBC 10.9* 10/26/2015 0300   WBC 11.0* 10/14/2015 1004   WBC 9.8 01/05/2015   RBC 4.18 10/26/2015 0300   RBC 4.35 10/14/2015 1004   HGB 12.1 10/26/2015 0300   HGB 12.7 10/14/2015 1004   HCT 39.5 10/26/2015 0300   HCT 40.0 10/14/2015 1004   PLT 187 10/26/2015 0300   PLT 174 10/14/2015 1004   MCV 94.5 10/26/2015 0300   MCV 92.0 10/14/2015 1004   MCH 28.9 10/26/2015 0300   MCH 29.2 10/14/2015 1004   MCHC 30.6 10/26/2015 0300   MCHC 31.7 10/14/2015 1004   RDW 14.0 10/26/2015 0300   RDW 13.7 10/14/2015 1004   LYMPHSABS 1.4 10/15/2015 2135   LYMPHSABS 0.8* 10/14/2015 1004   MONOABS 0.7 10/19/2015 2135   MONOABS 1.1* 10/14/2015 1004   EOSABS 0.1 10/22/2015 2135   EOSABS 0.2 10/14/2015 1004   BASOSABS 0.0 10/28/2015 2135   BASOSABS 0.0 10/14/2015 1004    CMP     Component Value Date/Time   NA 142 10/26/2015 0300   NA 140 10/14/2015 1004   NA 142 01/05/2015   K 4.7 10/26/2015 0300   K 4.4 10/14/2015 1004   CL 97* 10/26/2015 0300   CO2 29 10/26/2015 0300   CO2 32* 10/14/2015 1004    GLUCOSE 145* 10/26/2015 0300   GLUCOSE 174* 10/14/2015 1004   BUN 18 10/26/2015 0300   BUN 12.8 10/14/2015 1004   BUN 16 01/05/2015   CREATININE 0.85 10/26/2015 0300   CREATININE 0.8 10/14/2015 1004   CREATININE 0.66 10/05/2015 1139   CREATININE 0.7 01/05/2015   CALCIUM 9.3 10/26/2015 0300   CALCIUM 9.3 10/14/2015 1004   PROT 5.3* 10/17/2015 0215   PROT 5.7* 10/14/2015 1004   ALBUMIN 2.9* 10/17/2015 0215   ALBUMIN 3.3* 10/14/2015 1004   AST 31 10/17/2015 0215   AST 16 10/14/2015 1004   ALT 19 10/17/2015 0215   ALT 16 10/14/2015 1004   ALKPHOS 57 10/17/2015 0215   ALKPHOS 79 10/14/2015 1004   BILITOT 0.8 10/17/2015 0215   BILITOT 0.51 10/14/2015 1004   GFRNONAA >60 10/26/2015 0300   GFRNONAA 85 10/05/2015 1139   GFRAA >60 10/26/2015 0300   GFRAA >89 10/05/2015 1139       Problem List:  Patient Active Problem List   Diagnosis Date Noted  . Agitation   . Back pain   . Delirium 10/24/2015  . Hyponatremia 10/24/2015  . Altered mental status   . Acute on chronic diastolic CHF (congestive heart failure) ([place]) 10/21/2015  . COPD exacerbation ([place])   . Acute respiratory failure with hypoxia ([place]) 10/16/2015  . Acute exacerbation of congestive heart failure ([place]) 10/16/2015  . Medicare annual wellness visit, subsequent 06/27/2015  . BMI 25.87,  adult 06/27/2015  . Thoracic compression fracture ([place])   . Palliative care encounter 05/04/2015  . DNR (do not resuscitate) discussion 05/04/2015  . Acute on chronic respiratory  failure with hypoxia and hypercapnia ([place])   . Hip fracture, left ([place]) 12/26/2014  . Distal radius fracture, left 12/26/2014  . Prediabetes 09/30/2013  . Vitamin D deficiency 09/30/2013  . Medication management 09/30/2013  . Bronchogenic carcinoma squamous cell type ([place]) 05/14/2013  . Mixed hyperlipidemia 06/21/2007  . TOBACCO ABUSE 06/21/2007  . Depression, controlled 06/21/2007  . Essential hypertension 06/21/2007  . COPD mixed type ([place])  06/21/2007     Palliative Care Assessment & Plan    1.Code Status:  DNR    Code Status Orders        Start     Ordered   10/24/15 0305  Do not attempt resuscitation (DNR)   Continuous    Question Answer Comment  In the event of cardiac or respiratory ARREST Do not call a "code blue"   In the event of cardiac or respiratory ARREST Do not perform Intubation, CPR, defibrillation or ACLS   In the event of cardiac or respiratory ARREST Use medication by any route, position, wound care, and other measures to relive pain and suffering. May use oxygen, suction and manual treatment of airway obstruction as needed for comfort.      10/24/15 0304    Code Status History    Date Active Date Inactive Code Status Order ID Comments User Context   10/16/2015  6:17 PM 10/21/2015  4:13 PM DNR [id number]  Norval Morton, MD ED   10/16/2015  4:53 PM 10/16/2015  6:17 PM DNR [id number]  Rondel Jumbo, PA-C ED   05/01/2015  7:13 PM 05/06/2015  9:46 PM DNR [id number]  Modena Jansky, MD Inpatient   12/26/2014  3:39 AM 12/28/2014  7:11 PM Full Code [id number]  Ivor Costa, MD Inpatient   12/26/2011 10:11 PM 12/29/2011  5:17 PM Full Code [id number]  Marguerita Beards, RN Inpatient    Advance Directive Documentation        Most Recent Value   Type of Advance Directive  Healthcare Power of Attorney, Living will   Pre-existing out of facility DNR order (yellow form or pink MOST form)     "MOST" Form in Place?          Goals of Care/Additional Recommendations:   Psycho-social Needs: emotional support, allowed space and opportunity for daughter to express her love for her mother, share memories     Symptom Management:        2. Pain: Morphine gtt currently at 2 mg/hr IV with titration orders      3.  Terminal secretions: Rubinol 0.4 mg IV QID   Palliative Prophylaxis:   Aspiration, Bowel Regimen, Delirium Protocol, Frequent Pain Assessment, Oral Care and Turn Reposition    Discharge Planning:  [facility] death, likely hrs   Thank you for allowing the Palliative Medicine Team to assist in the care of this patient.   Time In: 0930 Time Out: 1000 Total Time 20 min Prolonged Time Billed  no         Knox Royalty, NP  10/30/2015, 9:48 AM  Please contact Palliative Medicine Team phone at [phone] for questions and concerns.

## 2015-10-30 NOTE — Progress Notes (Signed)
[facility] Progress Note  Patient: Hailey Keith JME:[id number]   PCP: Alesia Richards, MD DOB: 06-May-1935   DOA: 10/06/2015   DOS: 10/30/2015   Date of Service: the patient was seen and examined on 10/30/2015  Subjective: Patient has been more agitated and also has been complaining of back pain. Appears to have some hallucination as well.  Assessment and Plan: 1. Acute on chronic respiratory failure with hypoxia and hypercapnia. Acute exacerbation of COPD. Acute ENCEPHALOPATHY. chronic back pain. History of squamous cell lung carcinoma.  Patient presented with acute on chronic respiratory failure, patient was DO NOT RESUSCITATE admission. Patient was not able to tolerate the BiPAP. Negative CT head and no other sign of infection. Palliative care was consulted and the patient was transitioned to full comfort care. Initial plan was to transfer the patient to hospice at home. Given patient's rapid worsening, her medication has been adjusted and patient was thought to be actively dying here in the hospital. Agitation has improved after initiation of the morphine drip and patient's family was at bedside, who mentioned that the pt has remained comfortable throughout the night.  Discuss with the family regarding inpatient hospice and the care they provide, if the patient is with [place] tomorrow then pt may better served at a inpatient facility depending on the assessment.  Pt is more obtunded today and appear to be actively dying with hypotension and tachycardia.  Will continue current management other then changes in the scheduled nebs as well as lorazepam.  Anticipate hospital death at present given her rapid decline today and I do not feel that the patient is safe to tolerate the transfer at present to other facility. Continue to monitor the patient for symptomatic improvement.  Nutrition, comfort feeds Advance goals of care discussion: DNR/DNI, full comfort  Brief Summary of  Hospitalization:  Consultants: Palliative care Antibiotics: Anti-infectives    Start     Dose/Rate Route Frequency Ordered Stop   10/26/15 1000  levofloxacin (LEVAQUIN) IVPB 750 mg  Status:  Discontinued     750 mg 100 mL/hr over 90 Minutes Intravenous Every 24 hours 10/25/15 1027 10/28/15 1028   10/25/15 0800  levofloxacin (LEVAQUIN) tablet 500 mg  Status:  Discontinued     500 mg Oral Every 24 hours 10/25/15 0735 10/25/15 1027   10/24/15 0700  levofloxacin (LEVAQUIN) tablet 500 mg  Status:  Discontinued     500 mg Oral Daily at bedtime 10/24/15 0655 10/25/15 0735      Family Communication: family was present at bedside, at the time of interview.  Opportunity was given to ask question and all questions were answered satisfactorily.   Disposition: Barriers to safe discharge: Expecting in-hospital death   Intake/Output Summary (Last 24 hours) at 10/30/15 0840 Last data filed at 10/30/15 0831  Gross per 24 hour  Intake   3.33 ml  Output      0 ml  Net   3.33 ml   Filed Weights   10/24/15 1845  Weight: 71.5 kg (157 lb 10.1 oz)   Objective: Physical Exam: Filed Vitals:   10/29/15 0511 10/29/15 1130 10/29/15 2049 10/30/15 0426  BP: 145/70   78/43  Pulse: 91   122  Temp: 97.8 F (36.6 C)   97.9 F (36.6 C)  TempSrc: Oral   Oral  Resp: 14   10  Height:      Weight:      SpO2: 97% 90% 91% 94%    General: Appear in mild distress,  Respiratory: Bilateral Air entry present and bilateral Crackles  Data Reviewed: CBC:  Recent Labs Lab 10/29/2015 2135 10/24/15 0450 10/24/15 1702 10/25/15 0208 10/26/15 0300  WBC 14.7* 14.9* 12.9* 12.5* 10.9*  NEUTROABS 12.4*  --   --   --   --   HGB 12.3 11.7* 11.9* 11.3* 12.1  HCT 40.8 39.3 39.2 37.2 39.5  MCV 95.3 95.6 94.9 94.4 94.5  PLT 152 140* 156 168 696   Basic Metabolic Panel:  Recent Labs Lab 11/01/2015 2135 10/24/15 0450 10/25/15 0208 10/26/15 0300  NA 132* 140 138 142  K 3.9 4.7 3.4* 4.7  CL 89* 94* 96* 97*  CO2  31 34* 35* 29  GLUCOSE 117* 127* 106* 145*  BUN 29* 25* 17 18  CREATININE 1.04* 1.02* 0.81 0.85  CALCIUM 9.0 9.3 8.9 9.3    Recent Labs Lab 10/25/15 0739  AMMONIA 31   BNP (last 3 results)  Recent Labs  10/16/15 1304 10/14/2015 2135 10/25/15 1101  BNP 340.6* 132.8* 225.1*   Studies: No results found.   Scheduled Meds: . furosemide  40 mg Intravenous Daily  . scopolamine  1 patch Transdermal Q72H  . sodium chloride flush  3 mL Intravenous Q12H   Continuous Infusions: . morphine 2 mg/hr (10/29/15 1220)   PRN Meds: acetaminophen, glycopyrrolate, haloperidol lactate, levalbuterol **AND** ipratropium, LORazepam, morphine injection, morphine, ondansetron (ZOFRAN) IV  Time spent: 30 minutes  Author: Berle Mull, MD [facility] Pager: [phone] 10/30/2015 8:40 AM  If 7PM-7AM, please contact night-coverage at [email or web address], password [facility]

## 2015-10-31 DIAGNOSIS — R0600 Dyspnea, unspecified: Secondary | ICD-10-CM | POA: Insufficient documentation

## 2015-10-31 DIAGNOSIS — R410 Disorientation, unspecified: Secondary | ICD-10-CM

## 2015-10-31 DIAGNOSIS — K117 Disturbances of salivary secretion: Secondary | ICD-10-CM | POA: Insufficient documentation

## 2015-11-02 NOTE — Progress Notes (Signed)
[place] [facility] # [id number] [facility]. Pt. is not candidate for donation.

## 2015-11-02 NOTE — Plan of Care (Signed)
Received call that patient passed.  Attending physician not on campus.  Death certificate completed and left on patient's chart.  Time spent: 10 minutes  Marizol Borror D.O. [facility] Pager [phone]  If 7PM-7AM, please contact night-coverage [email or web address] Password [facility] Nov 12, 2015, 9:32 AM

## 2015-11-02 NOTE — Discharge Summary (Signed)
Physician Discharge Summary  Hailey Keith MRN: [id number] DOB/AGE: June 30, 1935 81 y.o.  PCP: Alesia Richards, MD   Admit date: 11-18-15 Death date: 2015/11/26  Discharge Diagnoses:   Principal Problem:   Delirium Active Problems:   Depression, controlled   Essential hypertension   COPD mixed type ([place])   Bronchogenic carcinoma squamous cell type ([place])   Acute on chronic respiratory failure with hypoxia and hypercapnia ([facility])   Hyponatremia   Back pain   Agitation   Increased oropharyngeal secretions   Dyspnea       Discharge Condition:   Discharge Instructions     Allergies  Allergen Reactions  . Flexeril [Cyclobenzaprine] Other (See Comments)    Mood changes   . Paxil [Paroxetine Hcl] Nausea Only        Significant Diagnostic Studies:  Dg Chest 2 View  November 18, 2015  CLINICAL DATA:  Initial evaluation for acute shortness of breath for 1 day. EXAM: CHEST  2 VIEW COMPARISON:  Prior study from 10/16/2015. FINDINGS: Cardiac and mediastinal silhouettes are stable in size and contour, and remain within normal limits. Lung volumes are mildly reduced, similar to prior. Prominence of the central perihilar vasculature without overt pulmonary edema. Patchy bilateral interstitial and alveolar airspace opacities are also similar to previous, likely chronic in nature, again possibly representing underlying interstitial lung disease as seen on prior CTs. No pulmonary edema or pleural effusion. No pneumothorax. No acute osseus abnormality. Scattered multilevel degenerative changes within the visualized spine. Sequela with vertebral augmentation within the upper thoracic spine, stable. IMPRESSION: 1. No active cardiopulmonary disease. 2. Scattered patchy and interstitial opacities, similar to multiple previous studies, and likely chronic in nature. Again, these changes likely reflect underlying COPD and possibly underlying interstitial lung disease as seen on prior CTs.  Electronically Signed   By: Jeannine Boga M.D.   On: 2015-11-18 21:11   Dg Thoracic Spine 2 View  10/24/2015  CLINICAL DATA:  Low back pain, bilateral shoulder pain EXAM: THORACIC SPINE 2 VIEWS COMPARISON:  11-18-15 FINDINGS: Two views of thoracic spine submitted. Alignment and disc spaces are preserved. Stable compression deformity and prior vertebroplasty at T3 and T4 level. There is moderate compression deformity upper endplate of T5 vertebral body new from prior exam. Clinical correlation is necessary. IMPRESSION: Stable compression deformity and prior vertebroplasty at T3 and T4 level. There is moderate compression deformity upper endplate of T5 vertebral body new from prior exam. Clinical correlation is necessary. Electronically Signed   By: Lahoma Crocker M.D.   On: 10/24/2015 13:29   Dg Lumbar Spine 2-3 Views  10/24/2015  CLINICAL DATA:  81 year old female with low back pain. EXAM: LUMBAR SPINE - 2-3 VIEW COMPARISON:  CT dated 12/25/2014 FINDINGS: No definite acute fracture or subluxation. There is osteopenia with stable appearing old compression fracture deformity of the inferior endplate of the L2 vertebra as seen on the prior CT. There is stable appearing retropulsion similar to the prior study. There is multilevel degenerative changes. The visualized spinous and transverse processes appear intact. There is atherosclerotic calcification of the aorta. IMPRESSION: No acute fracture or subluxation. Osteopenia with stable compression deformity of the L2 vertebra. Electronically Signed   By: Anner Crete M.D.   On: 10/24/2015 13:32   Ct Head Wo Contrast  11/18/15  CLINICAL DATA:  Confusion and altered mental status. EXAM: CT HEAD WITHOUT CONTRAST TECHNIQUE: Contiguous axial images were obtained from the base of the skull through the vertex without intravenous contrast. COMPARISON:  None. FINDINGS: Mild atrophy.  Moderate chronic small vessel ischemia.No intracranial hemorrhage, mass effect,  or midline shift. No hydrocephalus. The basilar cisterns are patent. No evidence of territorial infarct. No intracranial fluid collection. Calvarium is intact. Included paranasal sinuses are well aerated. Postsurgical change versus hypoplasia of right mastoid air cells. IMPRESSION: Atrophy and chronic small vessel ischemia without acute intracranial abnormality. Electronically Signed   By: Jeb Levering M.D.   On: 10/08/2015 23:04   Ct Chest W Contrast  10/14/2015  CLINICAL DATA:  Lung cancer diagnosed in 2014 with radiation therapy complete. Cough and shortness of breath. Restaging. EXAM: CT CHEST WITH CONTRAST TECHNIQUE: Multidetector CT imaging of the chest was performed during intravenous contrast administration. CONTRAST:  72m OMNIPAQUE IOHEXOL 300 MG/ML  SOLN COMPARISON:  03/23/2015. FINDINGS: Mediastinum/Nodes: No supraclavicular adenopathy. Advanced aortic and branch vessel atherosclerosis. Mild cardiomegaly with a new small pericardial effusion. Multivessel coronary artery atherosclerosis. No central pulmonary embolism, on this non-dedicated study. No mediastinal or hilar adenopathy. Lungs/Pleura: Similar trace right pleural fluid. Advanced centrilobular emphysema. Probable secretions in the non dependent trachea. Minimal motion degradation inferiorly. 4 mm right upper lobe pulmonary nodule on image 21/series 5 is unchanged. Soft tissue thickening along the right minor fissure measures maximally 1.8 x 1.2 cm on image 37/series 5. 1.8 x 1.7 cm on the prior. Favored to represent a volume loss rather than a true nodule. left upper lobe pulmonary nodule which measures 5 mm on image 25/ series 5, similar. Posterior left upper lobe irregular nodule measures maximally 8 mm on image 15/series 5, similar. Left lower lobe nodule measures 5 mm today versus 7 mm on the prior exam (when remeasured). Example image 29 today. Again identified is presumed radiation change in the left upper lobe with interstitial  thickening primarily centrally. No well-defined residual nodule identified in this area. Bibasilar areas of interstitial prominence are subpleural in distribution and may represent areas of mild honeycombing. Similar back to 11/23/2014. Upper abdomen: Normal imaged portions of the liver, spleen, stomach, pancreas, adrenal glands, kidneys. Abdominal aortic atherosclerosis. Musculoskeletal: Osteopenia. Status post vertebral augmentation at T4-T5. IMPRESSION: 1. Further response to therapy. Presumed radiation changes in the right upper lobe without well-defined residual nodule in this area. Other pulmonary nodules are similar and slightly decreased in size. No progressive or highly suspicious nodule identified. 2. No thoracic adenopathy. 3. Similar trace right pleural fluid. 4.  Atherosclerosis, including within the coronary arteries. 5. New small pericardial effusion. 6. Suspicion of interstitial lung disease, as detailed on 11/23/2014. This may represent an unusual appearance of usual interstitial pneumonitis , with mild isolated honeycombing at the bases. Electronically Signed   By: KAbigail MiyamotoM.D.   On: 10/14/2015 14:28   Ct Angio Chest Pe W/cm &/or Wo Cm  10/16/2015  CLINICAL DATA:  Respiratory distress, hypoxia. History of lung cancer status post radiation. EXAM: CT ANGIOGRAPHY CHEST WITH CONTRAST TECHNIQUE: Multidetector CT imaging of the chest was performed using the standard protocol during bolus administration of intravenous contrast. Multiplanar CT image reconstructions and MIPs were obtained to evaluate the vascular anatomy. CONTRAST:  629mOMNIPAQUE IOHEXOL 350 MG/ML SOLN [facility]:  [facility] FINDINGS: No evidence of pulmonary embolism. Mediastinum/Nodes: Cardiomegaly. Coronary atherosclerosis. Atherosclerotic calcifications of the aortic arch. Mild thoracic lymphadenopathy, including: --11 mm short axis prevascular node (series 601/image 28) --11 mm short axis  right paratracheal node (series 601/ image 34) --12 mm short axis subcarinal node (series 601/ image 39) --10 mm short axis left hilar node (series 601/image 40)  Visualized thyroid is unremarkable. Lungs/Pleura: No interval change from recent CT. Stable volume loss in the right middle lobe. Stable radiation changes/ scarring in the left upper lobe. Mild dependent atelectasis in the bilateral lower lobes. Underlying moderate centrilobular and paraseptal emphysematous changes. Superimposed interstitial lung disease is suspected at the lung bases. Scattered small pulmonary nodules measuring up to 5 mm, described on CT report dated 10/14/2015, less conspicuous on the current motion degraded exam. No pleural effusion or pneumothorax. Upper abdomen: Visualized upper abdomen is notable for vascular calcifications and vicarious excretion of contrast in the gallbladder. Musculoskeletal: Degenerative changes of the visualized thoracolumbar spine. Moderate loss of height at T4 with prior vertebral augmentation. Prior vertebral augmentation at T3. Review of the MIP images confirms the above findings. IMPRESSION: No evidence of pulmonary embolism. Otherwise unchanged from CT chest dated 10/14/2015. Electronically Signed   By: Julian Hy M.D.   On: 10/16/2015 15:18   Dg Chest Port 1 View  10/16/2015  CLINICAL DATA:  Shortness of breath, respiratory distress. Home O2 sats at 60%. History of lung cancer in 2014, COPD, hypertension. EXAM: PORTABLE CHEST 1 VIEW COMPARISON:  Chest x-ray dated 05/04/2015. Comparison also to a chest CT dated 10/14/2015. FINDINGS: Mild cardiomegaly is stable. Overall cardiomediastinal silhouette is stable in size and configuration. Again noted is a mild prominence of the central perihilar bronchovascular markings, not significantly changed compared to multiple prior studies, perhaps related to the chronic mild volume overload or chronic bronchitis. The patchy bilateral interstitial and alveolar  airspace opacities are unchanged compared to multiple prior studies, again likely chronic in nature and perhaps related to the question of chronic interstitial lung disease described on recent chest CT. No new lung findings seen. No new confluent opacity to suggest a developing pneumonia. No pleural effusion. No pneumothorax seen. Osseous structures about the chest are unremarkable. IMPRESSION: 1. No acute findings.  No evidence of pneumonia. 2. Probable chronic mild volume overload/CHF or chronic bronchitic changes. Also chronic interstitial thickening which is likely related to the recent CT report questioning a chronic interstitial lung disease. Electronically Signed   By: Franki Cabot M.D.   On: 10/16/2015 13:26       Filed Weights   10/24/15 1845  Weight: 71.5 kg (157 lb 10.1 oz)     Microbiology: No results found for this or any previous visit (from the past 240 hour(s)).     Blood Culture    Component Value Date/Time   SDES SPUTUM 10/17/2015 1627   SDES EXPECTORATED SPUTUM 10/17/2015 1627   SPECREQUEST NONE 10/17/2015 1627   SPECREQUEST NONE 10/17/2015 1627   CULT  10/17/2015 1627    NORMAL OROPHARYNGEAL FLORA Performed at [facility] 10/18/2015 FINAL 10/17/2015 1627   REPTSTATUS 10/21/2015 FINAL 10/17/2015 1627      Labs: No results found for this or any previous visit (from the past 48 hour(s)).   Lipid Panel     Component Value Date/Time   CHOL 164 10/05/2015 1139   TRIG 59 10/05/2015 1139   HDL 84 10/05/2015 1139   CHOLHDL 2.0 10/05/2015 1139   VLDL 12 10/05/2015 1139   LDLCALC 68 10/05/2015 1139     Lab Results  Component Value Date   HGBA1C 6.6* 10/05/2015   HGBA1C 6.4* 06/27/2015   HGBA1C 6.1* 11/02/2014     Lab Results  Component Value Date   MICROALBUR 0.5 06/27/2015   LDLCALC 68 10/05/2015   CREATININE 0.85 10/26/2015    Acute on  chronic respiratory failure with hypoxia and hypercapnia. Acute exacerbation of  COPD. Acute ENCEPHALOPATHY. chronic back pain. History of squamous cell lung carcinoma.  Patient presented with acute on chronic respiratory failure, patient was DO NOT RESUSCITATE admission. Patient was not able to tolerate the BiPAP. Negative CT head and no other sign of infection. Palliative care was consulted and the patient was transitioned to full comfort care. Initial plan was to transfer the patient to hospice at home. Given patient's rapid worsening, her medication has been adjusted and patient was thought to be actively dying here in the hospital. Agitation had improved after initiation of the morphine drip and patient's family was at bedside, who mentioned that the pt has remained comfortable throughout the night.   0600 pt.'s sister called nurse to room and upon entering pt. was found to be breathless and pulseless. A second nurse called to room M. Juel Burrow and also pronounced pt. Breathless and pulseless.       Discharge Exam:    Blood pressure 56/21, pulse 90, temperature 103 F (39.4 C), temperature source Oral, resp. rate 13, height '5\' 3"'$  (1.6 m), weight 71.5 kg (157 lb 10.1 oz), SpO2 100 %.        SignedReyne Dumas 2015/11/16, 11:08 AM        Time spent >45 mins

## 2015-11-02 NOTE — Progress Notes (Addendum)
0600 pt.'s  sister called nurse to room and upon entering pt. was found to be breathless and pulseless. A second nurse called to room M. Juel Burrow and also pronounced pt. Breathless and pulseless. Percival Spanish witnessed the waste of Morphine IV in the amt. of 125cc in med sink.

## 2015-11-02 DEATH — deceased

## 2015-12-26 ENCOUNTER — Ambulatory Visit: Payer: Self-pay | Admitting: Internal Medicine

## 2016-01-25 ENCOUNTER — Ambulatory Visit: Payer: Self-pay | Admitting: Internal Medicine

## 2016-06-26 IMAGING — MR MR THORACIC SPINE WO/W CM
4 of 16 series · 15 of 48 positions shown · IV contrast (multihance)
Comparison: Prior CT from 03/23/2015 and 12/25/2014.

CLINICAL DATA: Initial evaluation for possible T5 fracture, back
pain. History of lung cancer, skin cancer.

EXAM:
MRI THORACIC AND LUMBAR SPINE WITHOUT AND WITH CONTRAST
TECHNIQUE: Multiplanar and multiecho pulse sequences of the thoracic and lumbar
spine were obtained without and with intravenous contrast.
CONTRAST:  13mL MULTIHANCE GADOBENATE DIMEGLUMINE 529 MG/ML IV SOLN

[Series 8: T2 · axial · 4.0mm · 0.39mm/px · z∈[-238,+112]mm · 5 of 39 slices shown (1 of 3)]
[im 1/39]
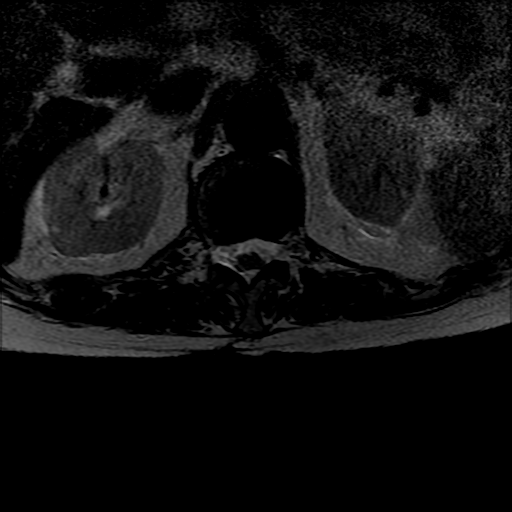
[im 10/39]
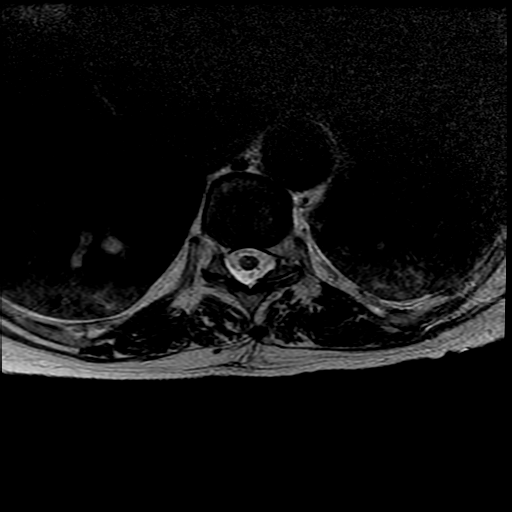
[im 20/39]
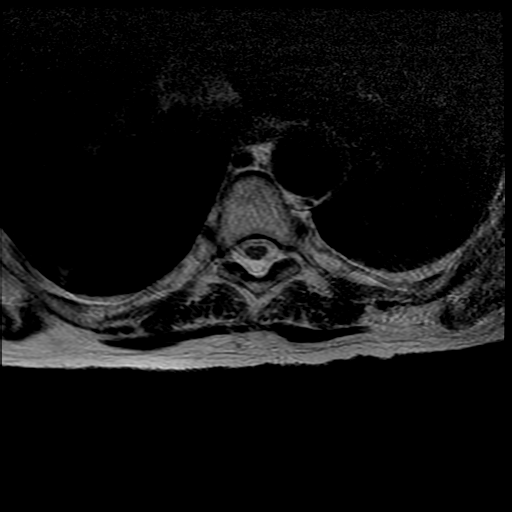
[im 29/39]
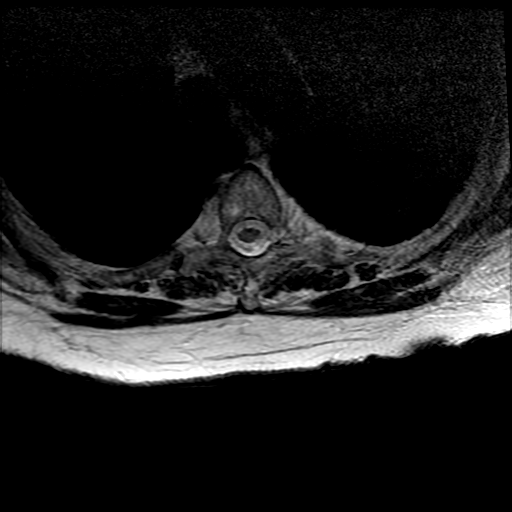
[im 39/39]
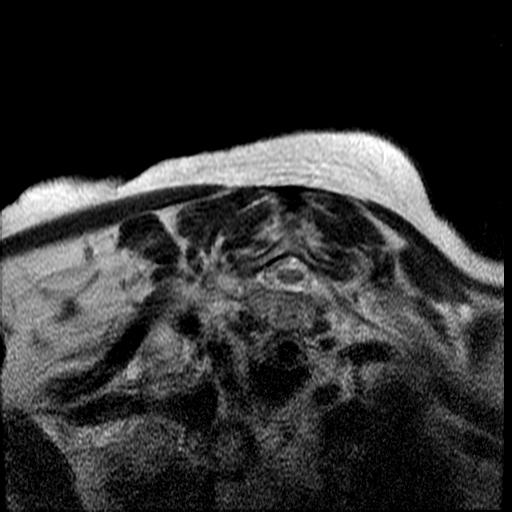

[Series 14: T2 · axial · 4.0mm · 0.39mm/px · z∈[-411,-203]mm · 5 of 39 slices shown (2 of 3)]
[im 1/39]
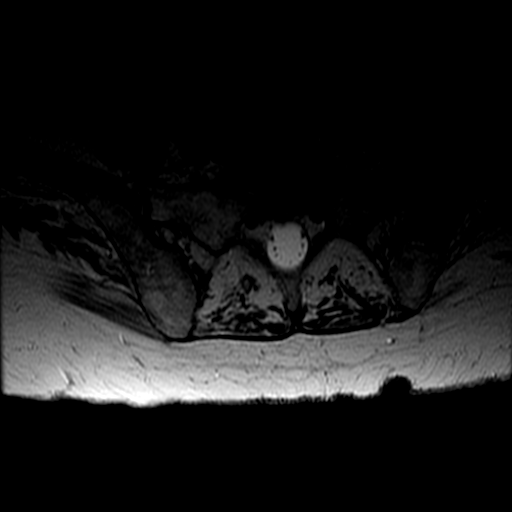
[im 10/39]
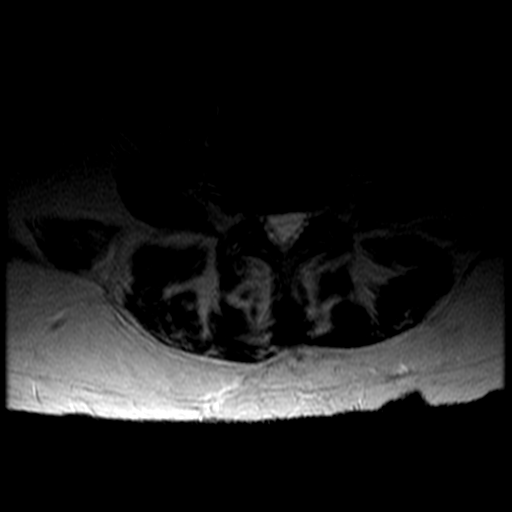
[im 20/39]
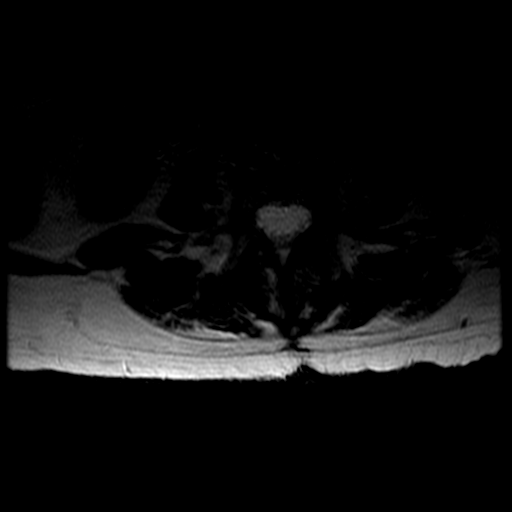
[im 29/39]
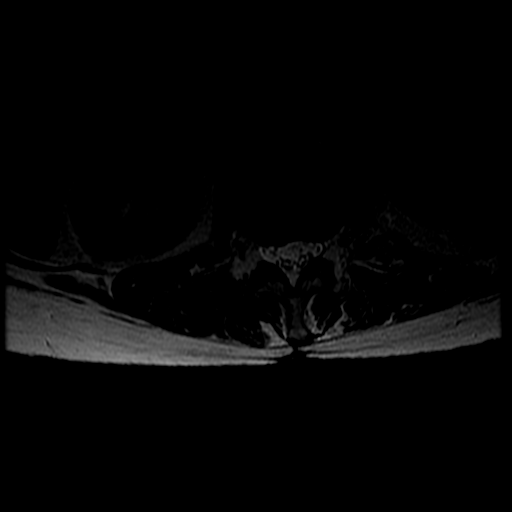
[im 39/39]
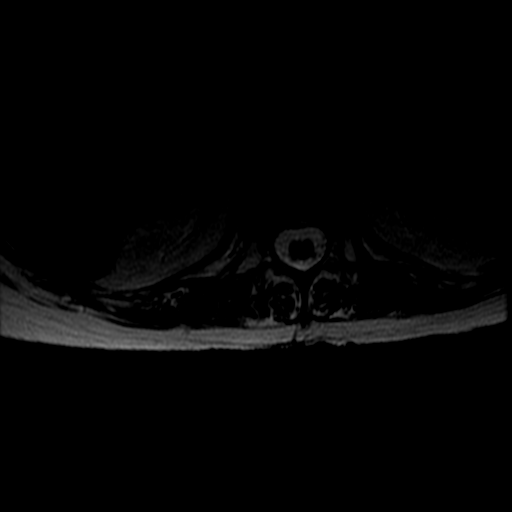

[Series 16: T2 · axial · 4.0mm · 0.39mm/px · z∈[-241,+111]mm · 4 of 41 slices shown (3 of 3)]
[im 1/41]
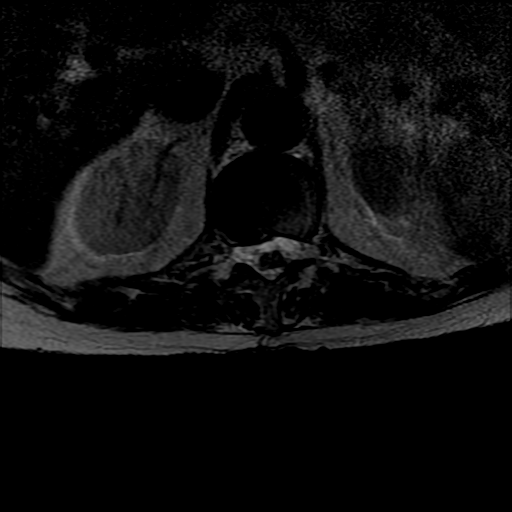
[im 11/41]
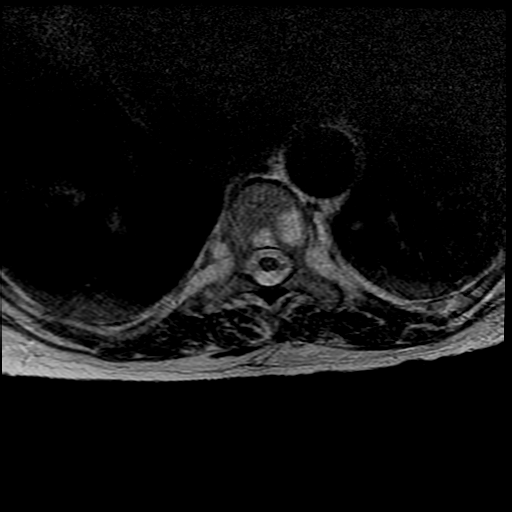
[im 21/41]
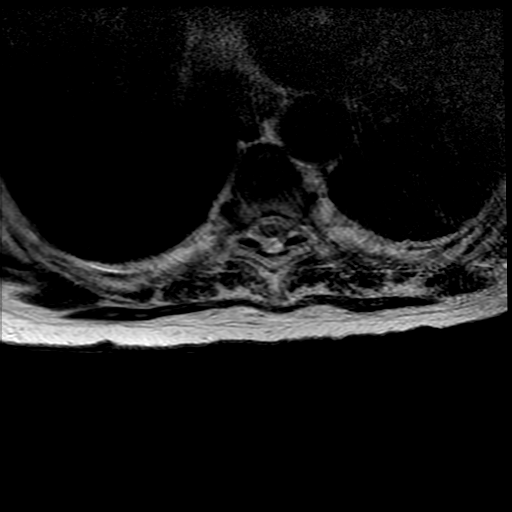
[im 41/41]
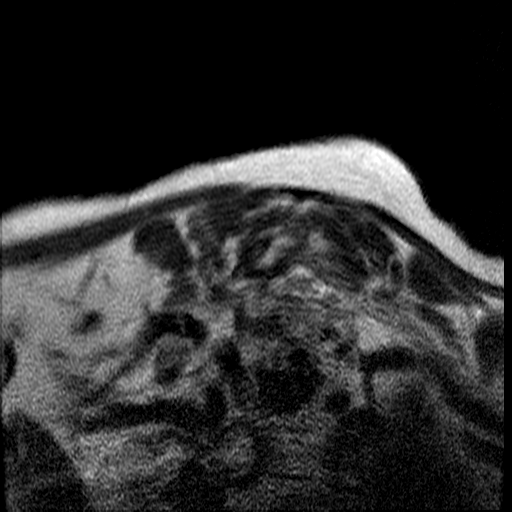

[Series 17: T2 post-contrast · sagittal · 4.0mm · 0.55mm/px · 1 of 12 slices shown]
[im 1/12]
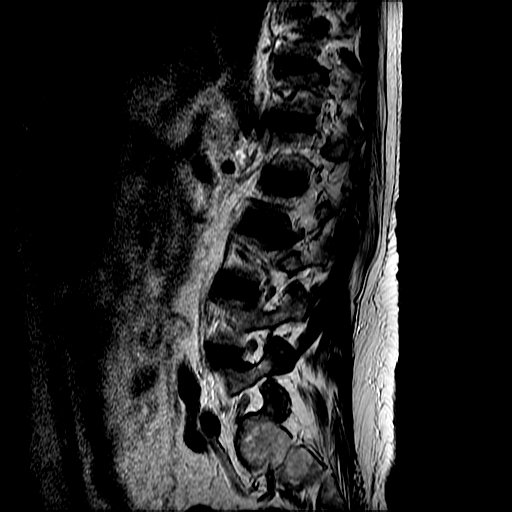

[15 of 48 positions shown; findings below may reference images not displayed]

FINDINGS: MR THORACIC SPINE FINDINGS

Trace anterolisthesis of C7 on T1. Vertebral bodies are otherwise
normally aligned with preservation of the normal thoracic kyphosis.
No listhesis.

Signal intensity within the thoracic spinal cord is normal.
Evaluation somewhat limited on this exam as the axial images through
the upper thoracic spine are vertically flipped due to technical
error.

There is an anterior compression deformity of the T5 vertebral body
with focal kyphosis of the thoracic spine at this level. This
fracture demonstrates hypo intense precontrast T1 signal intensity
with hyperintense STIR signal intensity with postcontrast
enhancement, most consistent with an acute compression fracture.
There is approximately 80% of anterior height loss with 2 mm bony
retropulsion. No significant canal stenosis. This has worsened
relative to prior CT from 03/23/2015.

Additional concavity with linear T1 hypo intense, STIR hyperintense
signal intensity with postcontrast enhancement seen through the
inferior endplate of T4. There is approximately 10-20% of central
height loss without bony retropulsion.

There is associated central/left paracentral disc protrusion of the
T4-5 intervertebral disc with resultant partial effacement of the
ventral thecal sac and mild canal stenosis.

No other compression fracture identified. Vertebral body heights
otherwise maintained. No other marrow edema. Probable small benign
hemangiomas within the T9 and T11 vertebral bodies. These are also
seen on prior CT of the chest.

Degenerative spondylolysis noted within the partially visualized
lower cervical spine. Small central disc protrusion at T6-7 without
significant stenosis. Bilateral facet arthrosis present at T11-12
without stenosis.

Small bilateral pleural effusions with associated atelectasis noted.
Paraspinous soft tissues otherwise within normal limits.

MR LUMBAR SPINE FINDINGS

Slight straightening and reversal of the normal lumbar lordosis at
L2. Vertebral bodies otherwise normally aligned. There is a chronic
compression fracture of L2 with approximately 60% of height loss and
2 mm of bony retropulsion. No marrow edema to suggest that this is
acute or subacute in nature. Vertebral body heights otherwise
maintained without acute fracture. No marrow edema. The vague
enhancement with associated hyperintense T1/T2 hyperintense signal
intensity within the posterior aspect of T11 most consistent with a
probable benign hemangioma, as seen on prior CT. No other abnormal
enhancement or focal osseous lesion.

Conus medullaris terminates normally at the L1 level. Signal
intensity within the visualized cord is normal. Nerve roots of the
cauda equina within normal limits.

Paraspinous soft tissues demonstrate no acute abnormality. Fatty
atrophy noted within the paraspinous musculature.

T11-12:  Negative.

T12-L1: Shallow right paracentral disc protrusion with associated
annular fissure flattens the right ventral thecal sac without
significant canal stenosis or neural impingement. Foramina are
widely patent.

L1-2: Diffuse degenerative disc bulge with disc desiccation.
Associated annular fissure posteriorly. Disc bulging slightly
eccentric to the right. Flattening of the ventral thecal sac without
significant canal stenosis. Foramina are widely patent.

L2-3: Diffuse disc bulge with disc desiccation. Associated annular
fissure posteriorly. There is bony retropulsion of approximately 2
mm of the anterior aspect of the posterior L2 vertebral body related
to the chronic compression fracture. Mild lateral recess crowding
bilaterally. No significant foraminal narrowing.

L3-4: Mild diffuse annular disc bulge with disc desiccation.
Associated posterior annular fissure. Mild facet and ligamentous
hypertrophy. No focal disc herniation. No significant canal or
foraminal stenosis.

L4-5: Mild diffuse disc bulge with disc desiccation. Associated
annular fissure posteriorly. No focal disc herniation. Mild to
moderate bilateral facet arthrosis with ligamentous hypertrophy.
There is resultant mild lateral recess stenosis bilaterally.
Foramina remain widely patent.

L5-S1: Mild diffuse disc bulge with disc desiccation. Posterior
annular fissure. No significant canal or foraminal stenosis. No
focal disc herniation.

Nonenhancing nerve root sleeve cyst noted posterior to the S2
segment.
IMPRESSION: MRI THORACIC SPINE IMPRESSION:

1. Acute anterior compression fracture of T5 with approximately 80%
of anterior height loss with 2 mm of bony retropulsion. No
significant stenosis. This as of worsened relative to prior CT from
03/23/2015.
[DATE]. Additional acute compression fracture through the inferior
endplate of T4 with approximately 10-20% of central height loss
without bony retropulsion.
3. Central/left paracentral disc protrusion at T4-5 with resultant
mild canal stenosis, likely related to adjacent compression
fractures.
4. Small central disc protrusion at T6-7 without stenosis.
5. Small bilateral pleural effusions with associated bibasilar
atelectasis.

MRI LUMBAR SPINE IMPRESSION:

1. No acute abnormality within the lumbar spine. Previously noted
compression fracture of L2 is chronic.
2. Mild multilevel degenerative spondylolysis and facet arthrosis as
detailed above. No significant stenosis within the lumbar spine.

## 2016-07-23 ENCOUNTER — Encounter: Payer: Self-pay | Admitting: Internal Medicine

## 2016-12-14 IMAGING — DX DG CHEST 2V
2 series · 2 of 2 positions shown · non-contrast
Comparison: Prior study from 10/16/2015.

CLINICAL DATA: Initial evaluation for acute shortness of breath for
1 day.

EXAM:
CHEST  2 VIEW

[w chest lat]
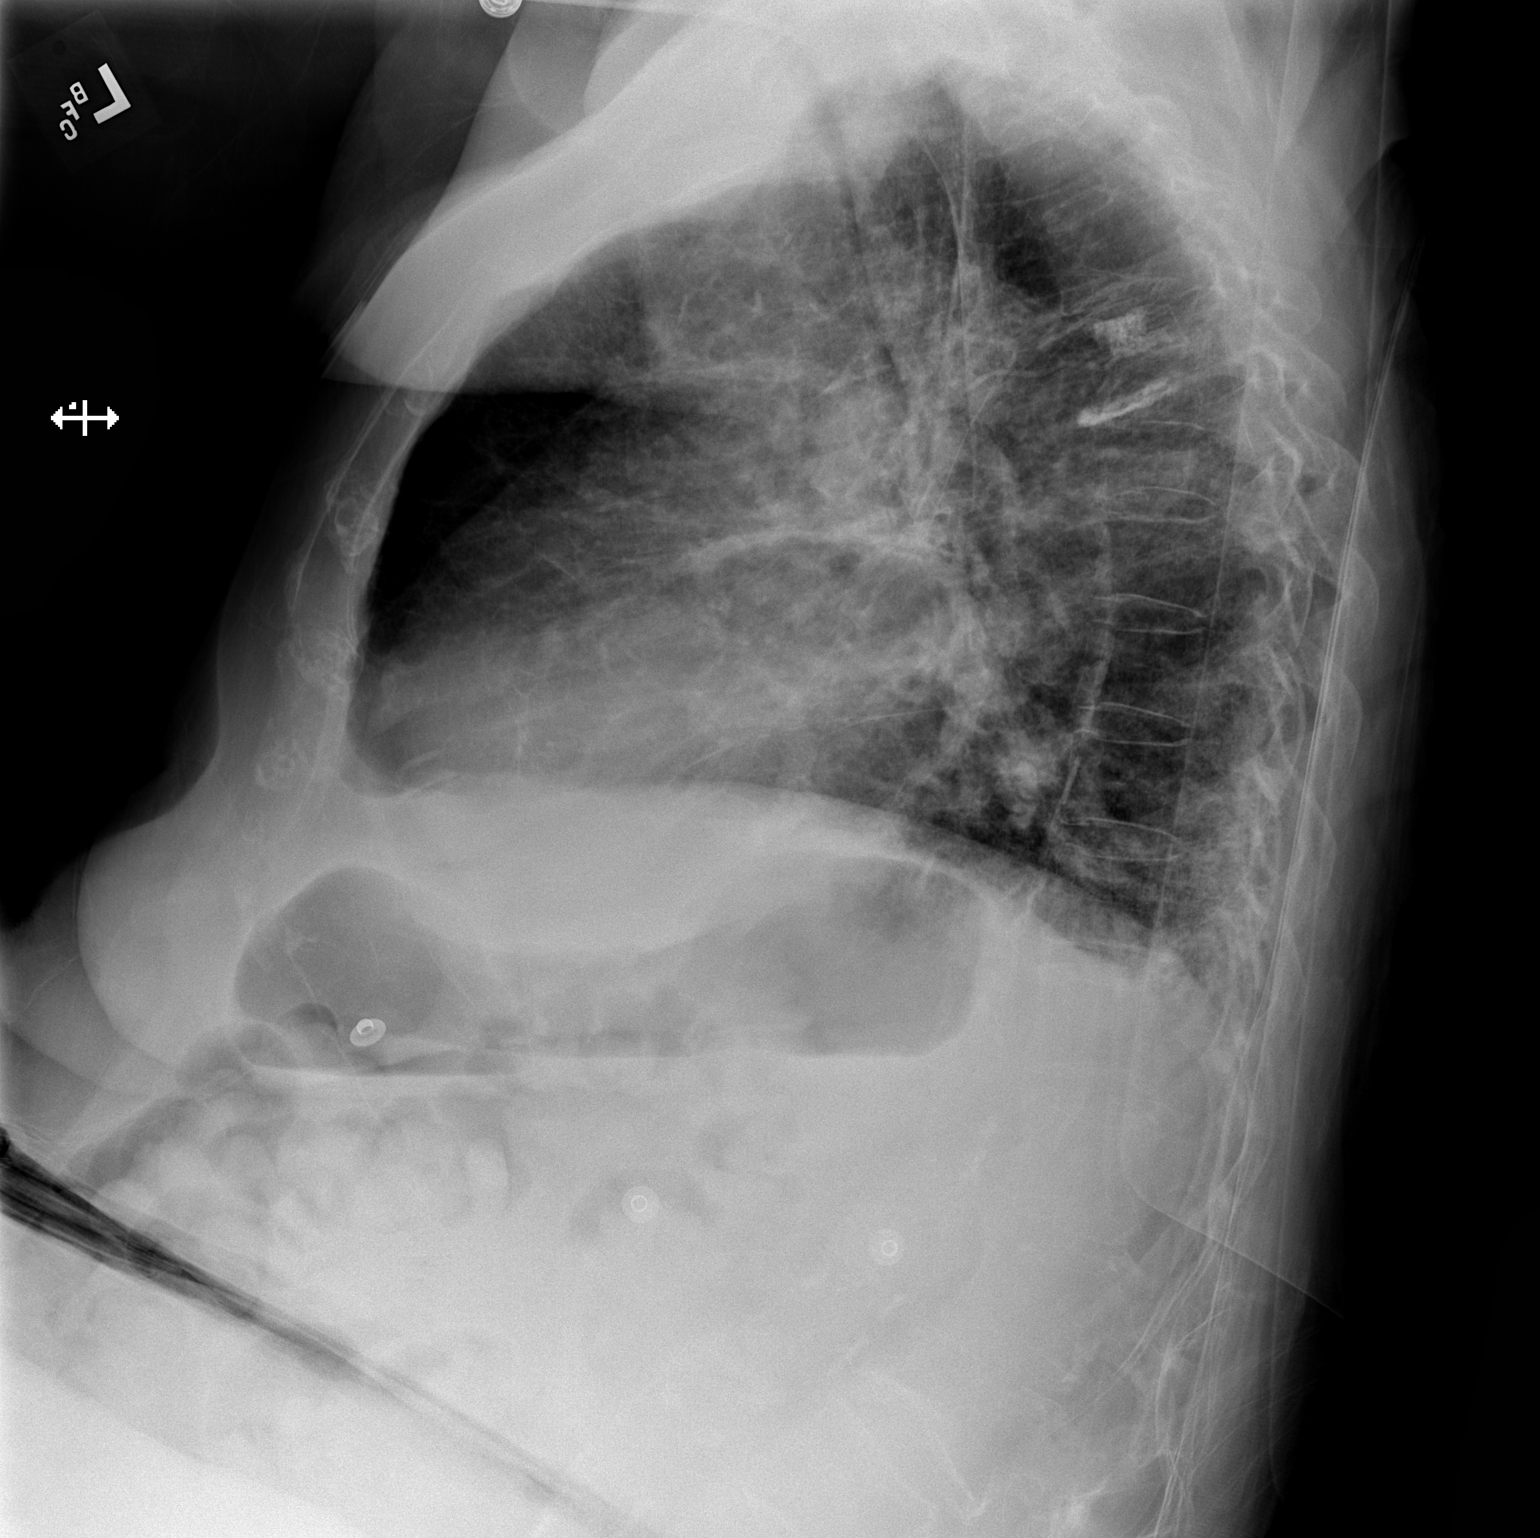

[x chest ap]
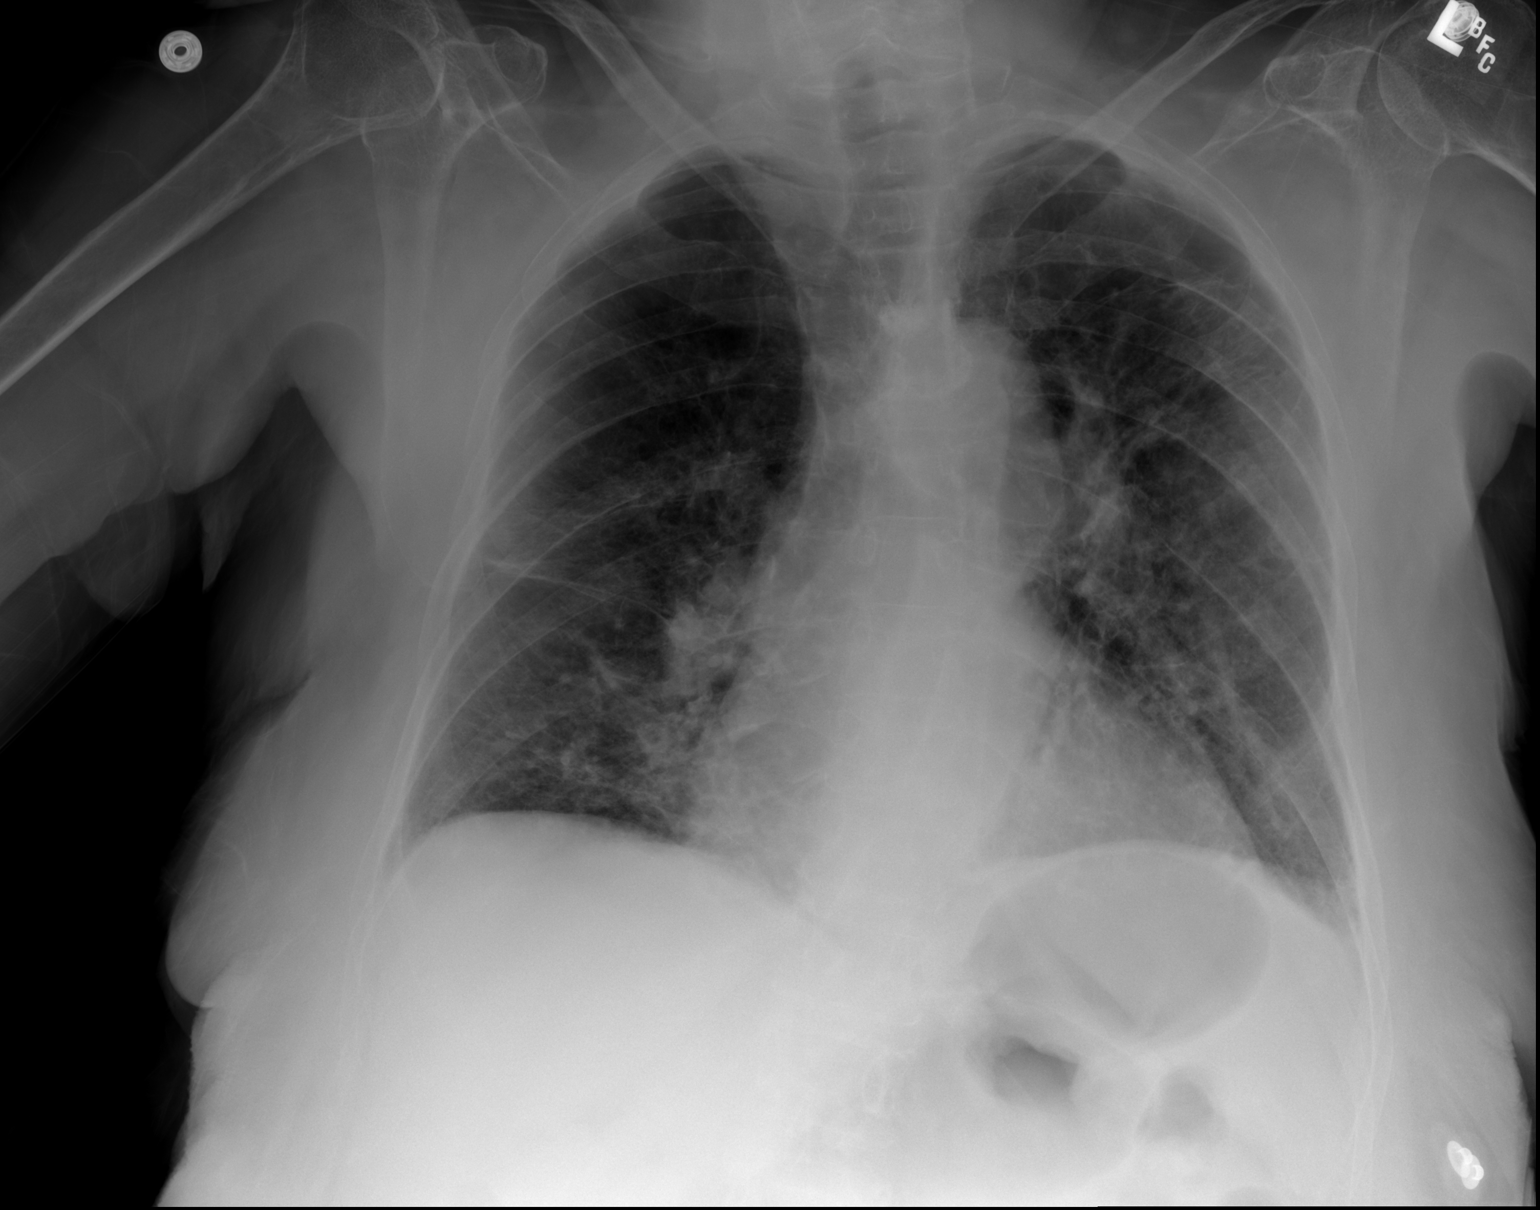

[2 of 2 positions shown; findings below may reference images not displayed]

FINDINGS: Cardiac and mediastinal silhouettes are stable in size and contour,
and remain within normal limits.

Lung volumes are mildly reduced, similar to prior. Prominence of the
central perihilar vasculature without overt pulmonary edema. Patchy
bilateral interstitial and alveolar airspace opacities are also
similar to previous, likely chronic in nature, again possibly
representing underlying interstitial lung disease as seen on prior
CTs. No pulmonary edema or pleural effusion. No pneumothorax.

No acute osseus abnormality. Scattered multilevel degenerative
changes within the visualized spine. Sequela with vertebral
augmentation within the upper thoracic spine, stable.
IMPRESSION: 1. No active cardiopulmonary disease.
2. Scattered patchy and interstitial opacities, similar to multiple
previous studies, and likely chronic in nature. Again, these changes
likely reflect underlying COPD and possibly underlying interstitial
lung disease as seen on prior CTs.

## 2016-12-15 IMAGING — DX DG THORACIC SPINE 2V
2 series · 2 of 2 positions shown · non-contrast
Comparison: 10/23/2015

CLINICAL DATA: Low back pain, bilateral shoulder pain

EXAM:
THORACIC SPINE 2 VIEWS

[t-spine ap]
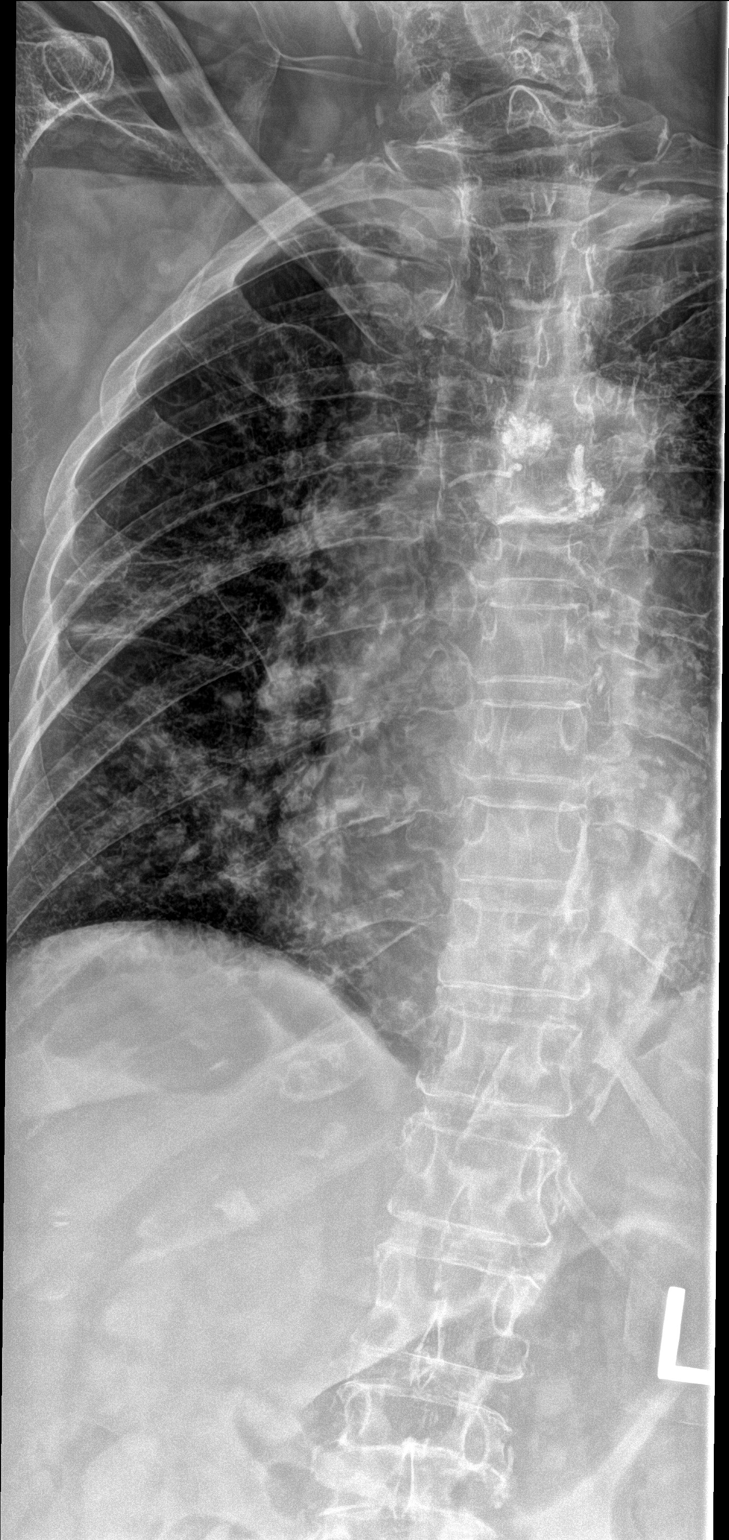

[t-spine lat]
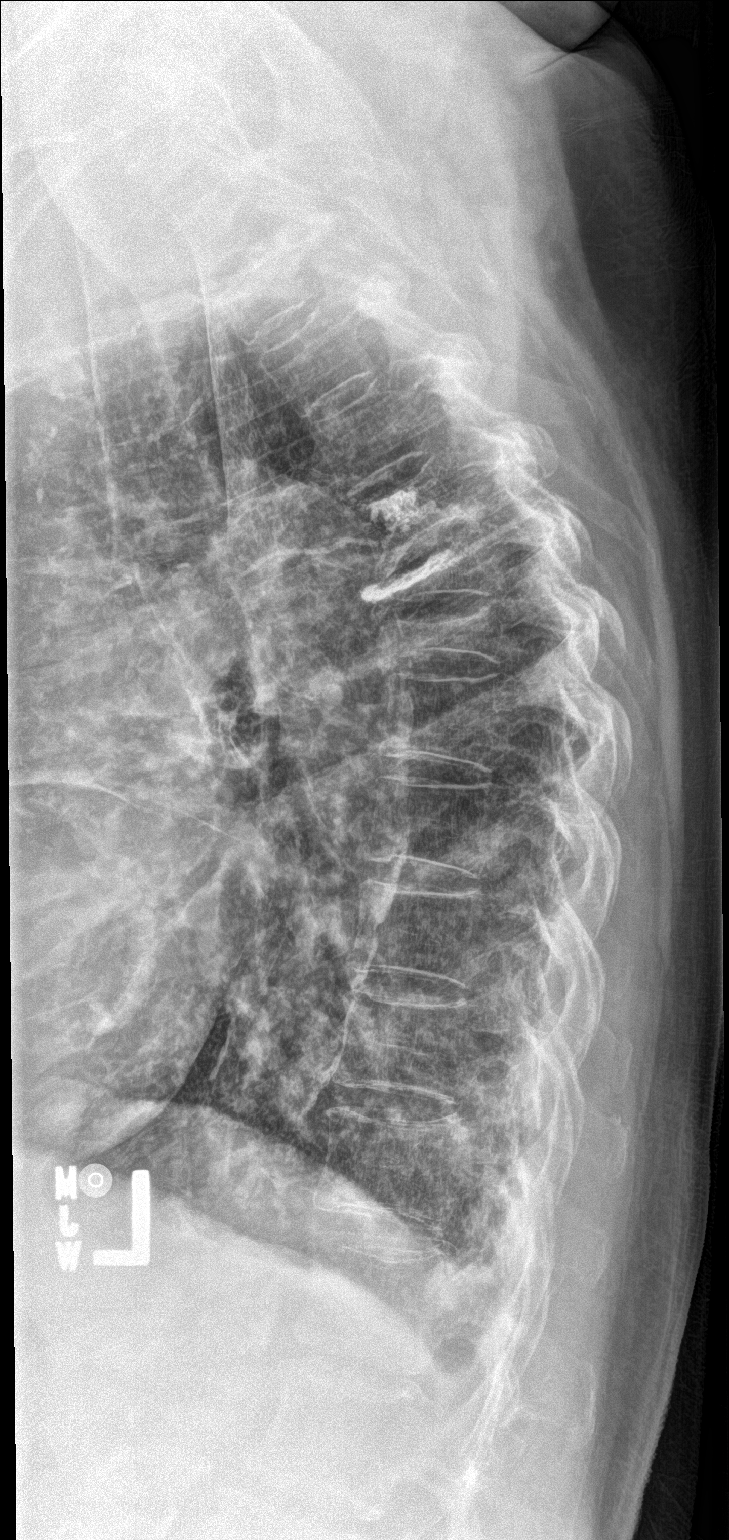

[2 of 2 positions shown; findings below may reference images not displayed]

FINDINGS: Two views of thoracic spine submitted. Alignment and disc spaces are
preserved. Stable compression deformity and prior vertebroplasty at
T3 and T4 level. There is moderate compression deformity upper
endplate of T5 vertebral body new from prior exam. Clinical
correlation is necessary.
IMPRESSION: Stable compression deformity and prior vertebroplasty at T3 and T4
level. There is moderate compression deformity upper endplate of T5
vertebral body new from prior exam. Clinical correlation is
necessary.
